# Patient Record
Sex: Female | Born: 1955 | Race: White | Hispanic: No | Marital: Married | Smoking: Former smoker
Health system: Southern US, Community
[De-identification: ages and names within clinical notes are randomized; demographics above are authoritative.]

## PROBLEM LIST (undated history)

## (undated) DIAGNOSIS — N2889 Other specified disorders of kidney and ureter: Secondary | ICD-10-CM

## (undated) DIAGNOSIS — I8000 Phlebitis and thrombophlebitis of superficial vessels of unspecified lower extremity: Secondary | ICD-10-CM

## (undated) DIAGNOSIS — G2581 Restless legs syndrome: Secondary | ICD-10-CM

## (undated) DIAGNOSIS — C541 Malignant neoplasm of endometrium: Secondary | ICD-10-CM

## (undated) DIAGNOSIS — C642 Malignant neoplasm of left kidney, except renal pelvis: Secondary | ICD-10-CM

## (undated) DIAGNOSIS — E559 Vitamin D deficiency, unspecified: Secondary | ICD-10-CM

## (undated) DIAGNOSIS — K259 Gastric ulcer, unspecified as acute or chronic, without hemorrhage or perforation: Secondary | ICD-10-CM

## (undated) DIAGNOSIS — R251 Tremor, unspecified: Secondary | ICD-10-CM

## (undated) DIAGNOSIS — R002 Palpitations: Secondary | ICD-10-CM

## (undated) DIAGNOSIS — R011 Cardiac murmur, unspecified: Secondary | ICD-10-CM

## (undated) DIAGNOSIS — G43009 Migraine without aura, not intractable, without status migrainosus: Secondary | ICD-10-CM

## (undated) DIAGNOSIS — F411 Generalized anxiety disorder: Secondary | ICD-10-CM

## (undated) DIAGNOSIS — M5412 Radiculopathy, cervical region: Secondary | ICD-10-CM

## (undated) DIAGNOSIS — I493 Ventricular premature depolarization: Secondary | ICD-10-CM

## (undated) DIAGNOSIS — M19049 Primary osteoarthritis, unspecified hand: Secondary | ICD-10-CM

## (undated) DIAGNOSIS — M1990 Unspecified osteoarthritis, unspecified site: Secondary | ICD-10-CM

## (undated) DIAGNOSIS — R7989 Other specified abnormal findings of blood chemistry: Secondary | ICD-10-CM

## (undated) DIAGNOSIS — G9332 Myalgic encephalomyelitis/chronic fatigue syndrome: Secondary | ICD-10-CM

## (undated) DIAGNOSIS — IMO0001 Reserved for inherently not codable concepts without codable children: Secondary | ICD-10-CM

## (undated) DIAGNOSIS — J309 Allergic rhinitis, unspecified: Secondary | ICD-10-CM

## (undated) DIAGNOSIS — N289 Disorder of kidney and ureter, unspecified: Secondary | ICD-10-CM

## (undated) DIAGNOSIS — E785 Hyperlipidemia, unspecified: Secondary | ICD-10-CM

## (undated) DIAGNOSIS — R5382 Chronic fatigue, unspecified: Secondary | ICD-10-CM

## (undated) DIAGNOSIS — D8989 Other specified disorders involving the immune mechanism, not elsewhere classified: Secondary | ICD-10-CM

## (undated) DIAGNOSIS — F341 Dysthymic disorder: Secondary | ICD-10-CM

## (undated) DIAGNOSIS — I10 Essential (primary) hypertension: Secondary | ICD-10-CM

## (undated) DIAGNOSIS — R413 Other amnesia: Secondary | ICD-10-CM

## (undated) DIAGNOSIS — F324 Major depressive disorder, single episode, in partial remission: Secondary | ICD-10-CM

## (undated) HISTORY — PX: OTHER SURGICAL HISTORY: SHX169

## (undated) HISTORY — DX: Ventricular premature depolarization: I49.3

## (undated) HISTORY — DX: Restless legs syndrome: G25.81

## (undated) HISTORY — PX: MANDIBLE SURGERY: SHX707

## (undated) HISTORY — DX: Radiculopathy, cervical region: M54.12

## (undated) HISTORY — DX: Dysthymic disorder: F34.1

## (undated) HISTORY — DX: Primary osteoarthritis, unspecified hand: M19.049

## (undated) HISTORY — DX: Hyperlipidemia, unspecified: E78.5

## (undated) HISTORY — DX: Tremor, unspecified: R25.1

## (undated) HISTORY — PX: NEPHRECTOMY TRANSPLANTED ORGAN: SUR880

## (undated) HISTORY — DX: Palpitations: R00.2

## (undated) HISTORY — DX: Essential (primary) hypertension: I10

## (undated) HISTORY — DX: Myalgic encephalomyelitis/chronic fatigue syndrome: G93.32

## (undated) HISTORY — DX: Other specified disorders involving the immune mechanism, not elsewhere classified: D89.89

## (undated) HISTORY — PX: CARPAL TUNNEL RELEASE: SHX101

## (undated) HISTORY — PX: TOTAL THYROIDECTOMY: SHX2547

## (undated) HISTORY — DX: Phlebitis and thrombophlebitis of superficial vessels of unspecified lower extremity: I80.00

## (undated) HISTORY — DX: Migraine without aura, not intractable, without status migrainosus: G43.009

## (undated) HISTORY — DX: Major depressive disorder, single episode, in partial remission: F32.4

## (undated) HISTORY — DX: Chronic fatigue, unspecified: R53.82

## (undated) HISTORY — DX: Generalized anxiety disorder: F41.1

## (undated) HISTORY — DX: Unspecified osteoarthritis, unspecified site: M19.90

## (undated) HISTORY — DX: Allergic rhinitis, unspecified: J30.9

---

## 2008-03-01 ENCOUNTER — Emergency Department (HOSPITAL_COMMUNITY): Admission: EM | Admit: 2008-03-01 | Discharge: 2008-03-01 | Payer: Self-pay | Admitting: Emergency Medicine

## 2008-05-07 ENCOUNTER — Other Ambulatory Visit: Admission: RE | Admit: 2008-05-07 | Discharge: 2008-05-07 | Payer: Self-pay | Admitting: Internal Medicine

## 2008-05-19 ENCOUNTER — Encounter: Admission: RE | Admit: 2008-05-19 | Discharge: 2008-05-19 | Payer: Self-pay | Admitting: Geriatric Medicine

## 2009-05-03 ENCOUNTER — Other Ambulatory Visit: Admission: RE | Admit: 2009-05-03 | Discharge: 2009-05-03 | Payer: Self-pay | Admitting: Internal Medicine

## 2010-01-02 ENCOUNTER — Encounter: Admission: RE | Admit: 2010-01-02 | Discharge: 2010-01-02 | Payer: Self-pay | Admitting: Internal Medicine

## 2010-03-01 LAB — BASIC METABOLIC PANEL
BUN: 18 mg/dL (ref 6–23)
CO2: 26 mEq/L (ref 19–32)
Calcium: 10.1 mg/dL (ref 8.4–10.5)
Chloride: 105 mEq/L (ref 96–112)
Creatinine, Ser: 0.91 mg/dL (ref 0.4–1.2)
GFR calc Af Amer: >60 mL/min (ref >60)
GFR calc non Af Amer: >60 mL/min (ref >60)
Glucose, Bld: 115 mg/dL — ABNORMAL HIGH (ref 70–99)
Potassium: 4.2 mEq/L (ref 3.5–5.1)
Sodium: 140 mEq/L (ref 135–145)

## 2010-03-03 ENCOUNTER — Ambulatory Visit
Admission: RE | Admit: 2010-03-03 | Discharge: 2010-03-03 | Payer: Self-pay | Source: Home / Self Care | Attending: Orthopedic Surgery | Admitting: Orthopedic Surgery

## 2010-03-06 LAB — POCT HEMOGLOBIN-HEMACUE: Hemoglobin: 14.4 g/dL (ref 12.0–15.0)

## 2010-03-10 NOTE — Op Note (Signed)
Krista Hodges, Krista Hodges                ACCOUNT NO.:  [id number]  MEDICAL RECORD NO.:  [id number]          PATIENT TYPE:  AMB  LOCATION:  DSC                          FACILITY:  [facility]  PHYSICIAN:  Krista Fitch. Buford Gayler, M.D. DATE OF BIRTH:  03/05/1955  DATE OF PROCEDURE:  03/03/2010 DATE OF DISCHARGE:                              OPERATIVE REPORT   PREOPERATIVE DIAGNOSIS:  Entrapped neuropathy, median nerve, right carpal tunnel.  POSTOPERATIVE DIAGNOSIS:  Entrapped neuropathy, median nerve, right carpal tunnel.  OPERATION:  Release of right transverse carpal ligament.  OPERATING SURGEON:  Krista Fitch. 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  She was referred through the courtesy of Dr. Johnella Moloney for evaluation and management of hand numbness.  Clinical examination suggested bilateral carpal tunnel syndrome.  Electrodiagnostic studies completed by Dr. Johna Roles on February 08, 2010, revealed significant right carpal tunnel syndrome and moderately severe left carpal tunnel syndrome.  Due to a failed respond to nonoperative measures, she was brought to the operating room at this time for release of her right transverse carpal ligament.  DESCRIPTION OF PROCEDURE:  Krista Hodges was interviewed in the holding area at the [facility] with her husband and daughter present. We reviewed her carpal tunnel syndrome predicament.  She understands the anticipated procedure.  Questions were invited and answered in detail. She was interviewed by Dr. Gypsy Balsam from anesthesia.  General anesthesia by LMA technique was recommended and accepted.  She was transferred to room 1 at [facility] and placed in supine position on the operating table.  Following the induction of general anesthesia by LMA technique, the  right arm was prepped with Betadine soap solution, sterilely draped.  A pneumatic tourniquet was applied to proximal right brachium.  Upon exsanguination of the right arm with Esmarch bandage, the arterial tourniquet was inflated to 250 mmHg. Procedure was commenced with short incision in the line of the ring finger of the palm.  Subcutaneous tissues were carefully divided, revealing the palmar fascia.  This was split longitudinally to reveal the common sensory branch of the median nerve.  These were followed back to the transverse carpal ligament which was gently isolated from the median nerve with a Penfield four Engineer, structural.  A subcutaneous pathway was created superficial to the transverse carpal ligament.  Scissors were used to release the ligament along its ulnar border, extending into the distal forearm.  Bleeding points were not problematic.  The wound was then repaired with intradermal 3-0 Prolene suture.  A compressive dressing was applied with a volar plaster splint, maintaining the wrist in 10 degrees of dorsiflexion.  For aftercare, she was provided a prescription for Percocet 5 mg 1 p.o. q.4-6 hours p.r.n. pain, 20 tablets without refill.  We will see Ms. Slawson back for followup in 1 week or sooner p.r.n. problems.     Krista Hodges, M.D.     RVS/MEDQ  D:  03/03/2010  T:  03/03/2010  Job:  [id number]  cc:   Candyce Churn, M.D.  Electronically Signed by Josephine Igo M.D. on 03/08/2010 08:03:45 AM

## 2010-03-10 NOTE — Op Note (Signed)
  [name], [name]                ACCOUNT NO.:  [id number]  MEDICAL RECORD NO.:  [id number]          PATIENT TYPE:  AMB  LOCATION:  DSC                          FACILITY:  [facility]  PHYSICIAN:  Katy Fitch. Sypher, M.D. DATE OF BIRTH:  07-29-1955  DATE OF PROCEDURE:  03/03/2010 DATE OF DISCHARGE:                              OPERATIVE REPORT   PREOPERATIVE DIAGNOSIS:  Severe right carpal tunnel syndrome with electrodiagnostic studies documented by Dr. Tyrell Antonio, physiatrist.  POSTOPERATIVE DIAGNOSIS:  Severe right carpal tunnel syndrome with electrodiagnostic studies documented by Dr. Tyrell Antonio, physiatrist.  OPERATIONS:  Release of right transverse carpal ligament.  OPERATING SURGEON:  Katy Fitch. Sypher, MD  ASSISTANT:  Marveen Reeks Dasnoit, PA-C  ANESTHESIA:  General by LMA.  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  She had previously been evaluated by Dr. Shirlee Latch.  She had had conservative care of carpal tunnel syndrome.  Contemporary electrodiagnostic studies revealed severe right carpal tunnel syndrome unresponsive to nonoperative care.  After informed consent, she is brought to the operative room at this time, anticipating release of right transverse carpal ligament.  PROCEDURE IN DETAIL:  Courteny Egler was brought to room 1 at the [facility] and placed in supine position on the operating table.  Following induction of general anesthesia under Dr. Burnett Corrente direct supervision, the right arm was prepped with Betadine soap and solution and sterilely draped.  After routine surgical time-out, procedure commenced with exsanguination of the right arm with an Esmarch bandage, inflation of the  arterial tourniquet to 250 mmHg.  Procedure commenced with a short incision in line of the ring finger and the palm.  Subcutaneous  tissues were carefully divided to reveal the palmar fascia.  This split longitudinally to reveal the common sensory branch of the median nerve and superficial palmar arch.  A pathway was created beneath the transverse carpal ligament with a Penfield 4 elevator followed by subcutaneous pathway overlying the transverse carpal ligament.  The transverse carpal ligament was released along its ulnar border, extending into the distal forearm, widely opened the carpal canal.  The ulnar bursa was noted to be fibrotic and swollen.  The median nerve was otherwise unimpeded.  Bleeding points were not problematic.  The wound was then repaired with intradermal 3-0 Prolene suture.  Compressive dressing was applied with a volar plaster splint, maintaining the wrist in 10 degrees of dorsiflexion.  For aftercare, she is provided a prescription for Percocet 5 mg 1 p.o. q.4-6 h. p.r.n. pain, 20 tablets without refill.     Katy Fitch Sypher, M.D.     RVS/MEDQ  D:  03/03/2010  T:  03/03/2010  Job:  [id number]  cc:   Dr. Tyrell Antonio  Electronically Signed by Josephine Igo M.D. on 03/08/2010 08:03:28 AM

## 2010-05-29 LAB — DIFFERENTIAL
Basophils Absolute: 0.1 10*3/uL (ref 0.0–0.1)
Basophils Relative: 2 % — ABNORMAL HIGH (ref 0–1)
Eosinophils Absolute: 0.1 10*3/uL (ref 0.0–0.7)
Eosinophils Relative: 1 % (ref 0–5)
Lymphocytes Relative: 29 % (ref 12–46)
Lymphs Abs: 1.8 10*3/uL (ref 0.7–4.0)
Monocytes Absolute: 0.4 10*3/uL (ref 0.1–1.0)
Monocytes Relative: 6 % (ref 3–12)
Neutro Abs: 3.9 10*3/uL (ref 1.7–7.7)
Neutrophils Relative %: 62 % (ref 43–77)

## 2010-05-29 LAB — BASIC METABOLIC PANEL
BUN: 12 mg/dL (ref 6–23)
CO2: 27 mEq/L (ref 19–32)
Calcium: 9.6 mg/dL (ref 8.4–10.5)
Chloride: 106 mEq/L (ref 96–112)
Creatinine, Ser: 0.8 mg/dL (ref 0.4–1.2)
GFR calc Af Amer: >60 mL/min (ref >60)
GFR calc non Af Amer: >60 mL/min (ref >60)
Glucose, Bld: 94 mg/dL (ref 70–99)
Potassium: 4.3 mEq/L (ref 3.5–5.1)
Sodium: 139 mEq/L (ref 135–145)

## 2010-05-29 LAB — POCT CARDIAC MARKERS
CKMB, poc: <1 ng/mL — ABNORMAL LOW (ref 1.0–8.0)
CKMB, poc: <1 ng/mL — ABNORMAL LOW (ref 1.0–8.0)
Myoglobin, poc: 60.8 ng/mL (ref 12–200)
Myoglobin, poc: 68.9 ng/mL (ref 12–200)
Troponin i, poc: <0.05 ng/mL (ref 0.00–0.09)
Troponin i, poc: <0.05 ng/mL (ref 0.00–0.09)

## 2010-05-29 LAB — CBC
HCT: 41 % (ref 36.0–46.0)
Hemoglobin: 14.1 g/dL (ref 12.0–15.0)
MCHC: 34.3 g/dL (ref 30.0–36.0)
MCV: 91.5 fL (ref 78.0–100.0)
Platelets: 245 10*3/uL (ref 150–400)
RBC: 4.49 MIL/uL (ref 3.87–5.11)
RDW: 12.3 % (ref 11.5–15.5)
WBC: 6.3 10*3/uL (ref 4.0–10.5)

## 2010-06-27 NOTE — Consult Note (Signed)
NAMEIDALY, VERRET                ACCOUNT NO.:  [id number]   MEDICAL RECORD NO.:  [id number]          PATIENT TYPE:  EMS   LOCATION:  MAJO                         FACILITY:  [facility]   PHYSICIAN:  Francisca December, M.D.  DATE OF BIRTH:  1956/01/10   DATE OF CONSULTATION:  03/01/2008  DATE OF DISCHARGE:  03/01/2008                                 CONSULTATION   CHIEF COMPLAINT:  Chest pain.   HISTORY OF PRESENT ILLNESS:  Ms. Krista Hodges is a 55 year old female with no  known history of coronary artery disease complaints of intermittent  chest pain for 1 week.  She worked last Monday with 30-minute episode of  chest pain with spontaneous resolution.  She has had several episodes  since that time lasting only a couple of minutes each time.  The  symptoms sometimes are associated with palpitations.  She denies any  nausea, vomiting, or diaphoresis.  She states that although there is no  pattern, she has complained of intermittent headache over the past 1-2  weeks.  Yesterday, she felt funny and thought she was having may be a  panic attack.  She took some Xanax and felt better but then had the  sense impending doom.  He took her blood pressure and was 150/90.  She  remained relaxed.  She came to work today and in the office, had another  episode of chest pain only lasting a couple of minutes.  They took her  blood pressure, it was 198/106.  They gave her a sublingual  nitroglycerin.  It brought her pressure down and now in the emergency  room, her systolic blood pressure is 98.  I asked her if she had chest  pain when she took the sublingual nitroglycerin, she states she did not,  although when she took the nitroglycerin she felt that her chest may be  felt better.  It is really unclear that if she had chest pain or not.   PAST MEDICAL HISTORY:  1. Hypertension.  2. Hyperlipidemia.  3. Anxiety.   SOCIAL HISTORY:  She lives in [place] with her husband.  She is  a Engineer, civil (consulting).  She smokes  1-1/2 packs per day.  No alcohol or illicit drug  use.   FAMILY HISTORY:  Mom died of colon cancer at age 33.  Dad had an  abdominal aortic aneurysm.  He also had angina.  He had a heart attack  at age 65.   ALLERGIES:  PENICILLIN.   MEDICATIONS:  1. Avalide 150/12.5 mg daily.  2. Vytorin 10/40 daily.  3. Baby aspirin daily.  4. Diclofenac p.r.n.  5. Zoloft 100 mg daily.  6. Xanax p.r.n.  7. Zyrtec p.r.n.   REVIEW OF SYSTEMS:  Chest pain and palpitations.  Review of systems is  otherwise negative.   PHYSICAL EXAMINATION:  VITAL SIGNS:  Temp 97.8, pulse 58, respirations  20, blood pressure 98/62, O2 saturation 99% on room air.  GENERAL:  She is in no acute distress.  HEENT:  Grossly normal.  No carotid or subclavian bruits.  No JVD or  thyromegaly.  Sclerae  clear.  Conjunctivae normal.  Nares without  drainage.  CHEST:  Clear to auscultation bilaterally.  No wheezing or rhonchi.  HEART:  Regular rate and rhythm.  No evidence of murmur, rub, or ectopy.  ABDOMEN:  Good bowel sounds, nontender, and nondistended.  No masses, no  bruits.  LOWER EXTREMITIES:  No peripheral edema.  SKIN:  Warm and dry.  NEUROLOGIC:  Cranial nerves II through XII grossly intact.  PSYCH:  She has normal mood and affect.   CHEST X-RAY:  No active disease.   LABORATORY STUDIES:  Hemoglobin 14.1, hematocrit 41, platelets 245,  white count 6.3.  Sodium 139, potassium 4.3, BUN 12, creatinine 0.80.  Point-of-care markers negative x2.  EKG, normal sinus rhythm in the 80s,  nonacute.   ASSESSMENT AND PLAN:  1. Angina, atypical.  2. Hypertension, which could be associated with chest pain, plus or      minus headaches, this seems to be under better control.  Perhaps,      we could increase her Avalide, although at this point, her pressure      is around 98, because of the nitroglycerin.  3. Hyperlipidemia.  4. Anxiety.   Assuming her point-of-care marker is negative, we are going to discharge  her  from the hospital and we will give her prescription for sublingual  nitroglycerin p.r.n. chest pain.  She needs to have a stress Cardiolite  and I have set this up for March 09, 2008, at 1 p.m.      Guy Franco, P.A.      Francisca December, M.D.  Electronically Signed    LB/MEDQ  D:  03/01/2008  T:  03/02/2008  Job:  [id number]   cc:   Candyce Churn, M.D.

## 2011-04-09 ENCOUNTER — Other Ambulatory Visit (HOSPITAL_COMMUNITY)
Admission: RE | Admit: 2011-04-09 | Discharge: 2011-04-09 | Disposition: A | Discharge status: Home / Self Care | Payer: PRIVATE HEALTH INSURANCE | Source: Ambulatory Visit | Attending: Internal Medicine | Admitting: Internal Medicine

## 2011-04-09 ENCOUNTER — Other Ambulatory Visit: Payer: Self-pay | Admitting: *Deleted

## 2011-04-09 DIAGNOSIS — Z01419 Encounter for gynecological examination (general) (routine) without abnormal findings: Secondary | ICD-10-CM | POA: Insufficient documentation

## 2011-12-07 ENCOUNTER — Other Ambulatory Visit: Payer: Self-pay | Admitting: Internal Medicine

## 2011-12-07 DIAGNOSIS — M5412 Radiculopathy, cervical region: Secondary | ICD-10-CM

## 2011-12-11 ENCOUNTER — Other Ambulatory Visit: Payer: PRIVATE HEALTH INSURANCE

## 2011-12-19 ENCOUNTER — Other Ambulatory Visit: Payer: Self-pay | Admitting: Internal Medicine

## 2011-12-19 DIAGNOSIS — Z1231 Encounter for screening mammogram for malignant neoplasm of breast: Secondary | ICD-10-CM

## 2011-12-20 ENCOUNTER — Ambulatory Visit
Admission: RE | Admit: 2011-12-20 | Discharge: 2011-12-20 | Disposition: A | Discharge status: Home / Self Care | Payer: BC Managed Care – PPO | Source: Ambulatory Visit | Attending: Internal Medicine | Admitting: Internal Medicine

## 2011-12-20 DIAGNOSIS — Z1231 Encounter for screening mammogram for malignant neoplasm of breast: Secondary | ICD-10-CM

## 2011-12-27 ENCOUNTER — Ambulatory Visit
Admission: RE | Admit: 2011-12-27 | Discharge: 2011-12-27 | Disposition: A | Discharge status: Home / Self Care | Payer: BC Managed Care – PPO | Source: Ambulatory Visit | Attending: Internal Medicine | Admitting: Internal Medicine

## 2011-12-27 DIAGNOSIS — M5412 Radiculopathy, cervical region: Secondary | ICD-10-CM

## 2013-09-16 DIAGNOSIS — I493 Ventricular premature depolarization: Secondary | ICD-10-CM | POA: Insufficient documentation

## 2013-09-16 DIAGNOSIS — R002 Palpitations: Secondary | ICD-10-CM | POA: Insufficient documentation

## 2013-10-12 ENCOUNTER — Other Ambulatory Visit: Payer: Self-pay | Admitting: Internal Medicine

## 2013-10-12 DIAGNOSIS — Z1231 Encounter for screening mammogram for malignant neoplasm of breast: Secondary | ICD-10-CM

## 2013-10-16 ENCOUNTER — Encounter: Payer: Self-pay | Admitting: *Deleted

## 2013-10-16 ENCOUNTER — Encounter: Payer: Self-pay | Admitting: Cardiovascular Disease

## 2013-10-16 DIAGNOSIS — E785 Hyperlipidemia, unspecified: Secondary | ICD-10-CM | POA: Insufficient documentation

## 2013-10-16 DIAGNOSIS — G43009 Migraine without aura, not intractable, without status migrainosus: Secondary | ICD-10-CM | POA: Insufficient documentation

## 2013-10-16 DIAGNOSIS — G9332 Myalgic encephalomyelitis/chronic fatigue syndrome: Secondary | ICD-10-CM | POA: Insufficient documentation

## 2013-10-16 DIAGNOSIS — G2581 Restless legs syndrome: Secondary | ICD-10-CM | POA: Insufficient documentation

## 2013-10-16 DIAGNOSIS — M1990 Unspecified osteoarthritis, unspecified site: Secondary | ICD-10-CM | POA: Insufficient documentation

## 2013-10-16 DIAGNOSIS — M76899 Other specified enthesopathies of unspecified lower limb, excluding foot: Secondary | ICD-10-CM | POA: Insufficient documentation

## 2013-10-16 DIAGNOSIS — L74519 Primary focal hyperhidrosis, unspecified: Secondary | ICD-10-CM | POA: Insufficient documentation

## 2013-10-16 DIAGNOSIS — F4320 Adjustment disorder, unspecified: Secondary | ICD-10-CM | POA: Insufficient documentation

## 2013-10-16 DIAGNOSIS — F324 Major depressive disorder, single episode, in partial remission: Secondary | ICD-10-CM | POA: Insufficient documentation

## 2013-10-16 DIAGNOSIS — E559 Vitamin D deficiency, unspecified: Secondary | ICD-10-CM | POA: Insufficient documentation

## 2013-10-16 DIAGNOSIS — R413 Other amnesia: Secondary | ICD-10-CM | POA: Insufficient documentation

## 2013-10-16 DIAGNOSIS — F341 Dysthymic disorder: Secondary | ICD-10-CM | POA: Insufficient documentation

## 2013-10-16 DIAGNOSIS — E669 Obesity, unspecified: Secondary | ICD-10-CM | POA: Insufficient documentation

## 2013-10-16 DIAGNOSIS — I10 Essential (primary) hypertension: Secondary | ICD-10-CM | POA: Insufficient documentation

## 2013-10-16 DIAGNOSIS — I8000 Phlebitis and thrombophlebitis of superficial vessels of unspecified lower extremity: Secondary | ICD-10-CM | POA: Insufficient documentation

## 2013-10-16 DIAGNOSIS — M19049 Primary osteoarthritis, unspecified hand: Secondary | ICD-10-CM | POA: Insufficient documentation

## 2013-10-16 DIAGNOSIS — F411 Generalized anxiety disorder: Secondary | ICD-10-CM | POA: Insufficient documentation

## 2013-10-16 DIAGNOSIS — D8989 Other specified disorders involving the immune mechanism, not elsewhere classified: Secondary | ICD-10-CM | POA: Insufficient documentation

## 2013-10-16 DIAGNOSIS — R5382 Chronic fatigue, unspecified: Secondary | ICD-10-CM

## 2013-10-16 DIAGNOSIS — M5412 Radiculopathy, cervical region: Secondary | ICD-10-CM | POA: Insufficient documentation

## 2013-10-16 DIAGNOSIS — J309 Allergic rhinitis, unspecified: Secondary | ICD-10-CM | POA: Insufficient documentation

## 2013-10-20 ENCOUNTER — Ambulatory Visit (INDEPENDENT_AMBULATORY_CARE_PROVIDER_SITE_OTHER): Payer: BC Managed Care – PPO | Admitting: Cardiovascular Disease

## 2013-10-20 ENCOUNTER — Encounter: Payer: Self-pay | Admitting: Cardiovascular Disease

## 2013-10-20 VITALS — BP 140/74 | HR 63 | Ht 65.0 in | Wt 205.8 lb

## 2013-10-20 DIAGNOSIS — I493 Ventricular premature depolarization: Secondary | ICD-10-CM | POA: Insufficient documentation

## 2013-10-20 DIAGNOSIS — I10 Essential (primary) hypertension: Secondary | ICD-10-CM

## 2013-10-20 DIAGNOSIS — R002 Palpitations: Secondary | ICD-10-CM

## 2013-10-20 DIAGNOSIS — R0600 Dyspnea, unspecified: Secondary | ICD-10-CM

## 2013-10-20 DIAGNOSIS — I4949 Other premature depolarization: Secondary | ICD-10-CM

## 2013-10-20 DIAGNOSIS — R0609 Other forms of dyspnea: Secondary | ICD-10-CM

## 2013-10-20 DIAGNOSIS — R0989 Other specified symptoms and signs involving the circulatory and respiratory systems: Secondary | ICD-10-CM

## 2013-10-20 DIAGNOSIS — F411 Generalized anxiety disorder: Secondary | ICD-10-CM

## 2013-10-20 NOTE — Assessment & Plan Note (Signed)
Likely benign  PRN Inderal called in  Echo and ETT to r/o structural heart disease

## 2013-10-20 NOTE — Progress Notes (Signed)
Patient ID: Krista Hodges, female   DOB: 1955-04-11, 58 y.o.   MRN: [id number]    58 yo referred by primary for palpitations and PVC;s on ECG  Palpitations over 6 months worse last few weeks  Some associated dyspnea No pre-syncope or chest pain.  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  Minimal caffeine and no ETOH.  Triggers hard to identify  More sedentary events not exertional  More during day but also at night watching t.v. Fatigue , malaise and more headaches.  Xanax helps with palpitations  Former smoker quit in 2014   Recent labs 09/16/13  K 4.6 Hct 44.7  TSH 2.24 on 5/15      ROS: Denies fever, malais, weight loss, blurry vision, decreased visual acuity, cough, sputum, SOB, hemoptysis, pleuritic pain, palpitaitons, heartburn, abdominal pain, melena, lower extremity edema, claudication, or rash.  All other systems reviewed and negative   General: Affect appropriate Overweight white female  HEENT: normal Neck supple with no adenopathy JVP normal no bruits no thyromegaly Lungs clear with no wheezing and good diaphragmatic motion Heart:  S1/S2 no murmur,rub, gallop or click PMI normal Abdomen: benighn, BS positve, no tenderness, no AAA no bruit.  No HSM or HJR Distal pulses intact with no bruits No edema Neuro non-focal Skin warm and dry No muscular weakness  Medications No current outpatient prescriptions on file.   No current facility-administered medications for this visit.    Allergies Review of patient's allergies indicates not on file.  Family History: Family History  Problem Relation Age of Onset  . Cancer Mother     Social History: History   Social History  . Marital Status: Married    Spouse Name: N/A    Number of Children: N/A  . Years of Education: N/A   Occupational History  . Not on file.   Social History Main Topics  . Smoking  status: Not on file  . Smokeless tobacco: Not on file  . Alcohol Use: Not on file  . Drug Use: Not on file  . Sexual Activity: Not on file   Other Topics Concern  . Not on file   Social History Narrative  . No narrative on file    Electrocardiogram:  SR normal except for isolated PVC rate 66 09/16/13  Assessment and Plan

## 2013-10-20 NOTE — Patient Instructions (Signed)
Your physician recommends that you schedule a follow-up appointment in:   [place] Your physician recommends that you continue on your current medications as directed. Please refer to the Current Medication list given to you today. Your physician has requested that you have an exercise tolerance test. For further information please visit [email or web address]. Please also follow instruction sheet, as given.   Your physician has requested that you have an echocardiogram. Echocardiography is a painless test that uses sound waves to create images of your heart. It provides your doctor with information about the size and shape of your heart and how well your heart's chambers and valves are working. This procedure takes approximately one hour. There are no restrictions for this procedure.

## 2013-10-20 NOTE — Assessment & Plan Note (Signed)
Well controlled.  Continue current medications and low sodium Dash type diet.    

## 2013-10-20 NOTE — Assessment & Plan Note (Signed)
Seems to be biggest issue with lots of recent issues including sons paralysis.  Likely adrenergic tone contributing to PVCs  F/u primary to simplify meds Currently on both welbutrin and zoloft

## 2013-10-20 NOTE — Assessment & Plan Note (Signed)
Normal cardiopulmonary exam  Interesting that she indicates desats with frequent palpitations.  Echo to assess RV and LV function

## 2013-10-22 ENCOUNTER — Telehealth: Payer: Self-pay

## 2013-10-22 NOTE — Telephone Encounter (Signed)
Not toradol  PRN Inderal 10mg  30 tabs PRN q 6 hours as needed for palpitations

## 2013-10-23 ENCOUNTER — Other Ambulatory Visit: Payer: Self-pay

## 2013-10-23 ENCOUNTER — Ambulatory Visit (HOSPITAL_COMMUNITY): Payer: BC Managed Care – PPO | Attending: Cardiology

## 2013-10-23 ENCOUNTER — Ambulatory Visit
Admission: RE | Admit: 2013-10-23 | Discharge: 2013-10-23 | Disposition: A | Discharge status: Home / Self Care | Payer: BC Managed Care – PPO | Source: Ambulatory Visit | Attending: Internal Medicine | Admitting: Internal Medicine

## 2013-10-23 DIAGNOSIS — I10 Essential (primary) hypertension: Secondary | ICD-10-CM | POA: Insufficient documentation

## 2013-10-23 DIAGNOSIS — Z1231 Encounter for screening mammogram for malignant neoplasm of breast: Secondary | ICD-10-CM

## 2013-10-23 DIAGNOSIS — Z87891 Personal history of nicotine dependence: Secondary | ICD-10-CM | POA: Insufficient documentation

## 2013-10-23 DIAGNOSIS — E785 Hyperlipidemia, unspecified: Secondary | ICD-10-CM | POA: Diagnosis not present

## 2013-10-23 DIAGNOSIS — R002 Palpitations: Secondary | ICD-10-CM | POA: Insufficient documentation

## 2013-10-23 DIAGNOSIS — I059 Rheumatic mitral valve disease, unspecified: Secondary | ICD-10-CM | POA: Insufficient documentation

## 2013-10-23 DIAGNOSIS — F3289 Other specified depressive episodes: Secondary | ICD-10-CM | POA: Diagnosis not present

## 2013-10-23 DIAGNOSIS — M129 Arthropathy, unspecified: Secondary | ICD-10-CM | POA: Diagnosis not present

## 2013-10-23 DIAGNOSIS — R413 Other amnesia: Secondary | ICD-10-CM | POA: Insufficient documentation

## 2013-10-23 DIAGNOSIS — I4949 Other premature depolarization: Secondary | ICD-10-CM | POA: Diagnosis not present

## 2013-10-23 DIAGNOSIS — F329 Major depressive disorder, single episode, unspecified: Secondary | ICD-10-CM | POA: Insufficient documentation

## 2013-10-23 MED ORDER — PROPRANOLOL HCL 10 MG PO TABS: 10.0000 mg | ORAL_TABLET | Freq: Four times a day (QID) | ORAL | Status: DC | PRN

## 2013-10-23 NOTE — Progress Notes (Signed)
2D Echo completed. 10/23/2013 

## 2013-10-28 ENCOUNTER — Ambulatory Visit: Payer: BC Managed Care – PPO

## 2013-11-02 ENCOUNTER — Telehealth: Payer: Self-pay | Admitting: Cardiovascular Disease

## 2013-11-02 NOTE — Telephone Encounter (Signed)
New problem ° ° °Pt returning your call. °

## 2013-11-02 NOTE — Telephone Encounter (Signed)
PT AWARE OF ECHO RSEULTS./CY

## 2013-11-18 ENCOUNTER — Telehealth: Payer: Self-pay | Admitting: *Deleted

## 2013-11-18 ENCOUNTER — Ambulatory Visit (INDEPENDENT_AMBULATORY_CARE_PROVIDER_SITE_OTHER): Payer: BC Managed Care – PPO | Admitting: Nurse Practitioner

## 2013-11-18 ENCOUNTER — Encounter: Payer: Self-pay | Admitting: Nurse Practitioner

## 2013-11-18 VITALS — BP 120/78 | HR 76

## 2013-11-18 DIAGNOSIS — R0600 Dyspnea, unspecified: Secondary | ICD-10-CM

## 2013-11-18 DIAGNOSIS — R002 Palpitations: Secondary | ICD-10-CM

## 2013-11-18 NOTE — Telephone Encounter (Signed)
Message copied by Richmond Campbell on Wed Nov 18, 2013  2:29 PM ------      Message from: Josue Hector      Created: Wed Nov 18, 2013  1:00 PM       Normal ETT            ----- Message -----         From: Burtis Junes, NP         Sent: 11/18/2013  12:18 PM           To: Josue Hector, MD                   ------

## 2013-11-18 NOTE — Progress Notes (Signed)
Exercise Treadmill Test  Pre-Exercise Testing Evaluation Rhythm: normal sinus  Rate: 71 bpm     Test  Exercise Tolerance Test Ordering MD: Jenkins Rouge, MD  Interpreting MD: Truitt Merle, NP  Unique Test No: 1  Treadmill:  1  Indication for ETT: Dyspnea/Palps  Contraindication to ETT: No   Stress Modality: exercise - treadmill  Cardiac Imaging Performed: non   Protocol: standard Bruce - maximal  Max BP:  202/77  Max MPHR (bpm):  161 85% MPR (bpm):  137  MPHR obtained (bpm):  142 % MPHR obtained:  88%  Reached 85% MPHR (min:sec):  7:17 Total Exercise Time (min-sec):  8 minutes  Workload in METS:  10.0 Borg Scale: 17  Reason ETT Terminated:  patient's desire to stop    ST Segment Analysis At Rest: normal ST segments - no evidence of significant ST depression With Exercise: no evidence of significant ST depression  Other Information Arrhythmia:  No Angina during ETT:  absent (0) Quality of ETT:  diagnostic  ETT Interpretation:  normal - no evidence of ischemia by ST analysis  Comments: Patient presents today for routine GXT. Has had palpitations. No active chest pain. PVCs have been noted.  Echo with normal EF - no MVP noted - and diastolic dysfunction. Has been using PRN Inderal with good results.   Today the patient exercised on the standard Bruce protocol for a total of 8 minutes.  Good exercise tolerance.  Adequate blood pressure response.  Clinically negative for chest pain. Test was stopped due to target HR achieved and patient fatigue.  EKG negative for ischemia. No significant arrhythmia noted. One PVC noted.   Recommendations: CV risk factor modification.  See back as planned  Patient is agreeable to this plan and will call if any problems develop in the interim.   Burtis Junes, RN, [place] [address] [place], [place]  [address] [phone]

## 2013-11-18 NOTE — Telephone Encounter (Signed)
LMTCB ./CY 

## 2013-11-19 NOTE — Telephone Encounter (Signed)
Spoke with pt and informed her of normal ETT.  She reports she was told her heart was stiff and she is concerned about this.  I reviewed echo results with her.  She has upcoming appt with Dr. Johnsie Cancel on November 30, 2013 and will discuss results with him at this appt

## 2013-11-19 NOTE — Telephone Encounter (Signed)
Follow Up--pt returned call  °

## 2013-11-30 ENCOUNTER — Ambulatory Visit (INDEPENDENT_AMBULATORY_CARE_PROVIDER_SITE_OTHER): Payer: BC Managed Care – PPO | Admitting: Cardiovascular Disease

## 2013-11-30 ENCOUNTER — Encounter: Payer: Self-pay | Admitting: Cardiovascular Disease

## 2013-11-30 VITALS — BP 124/68 | HR 67 | Ht 65.0 in | Wt 202.8 lb

## 2013-11-30 DIAGNOSIS — I493 Ventricular premature depolarization: Secondary | ICD-10-CM

## 2013-11-30 DIAGNOSIS — I10 Essential (primary) hypertension: Secondary | ICD-10-CM

## 2013-11-30 NOTE — Assessment & Plan Note (Signed)
Well controlled.  Continue current medications and low sodium Dash type diet.    

## 2013-11-30 NOTE — Progress Notes (Signed)
Patient ID: Krista Hodges, female   DOB: 1955-06-06, 58 y.o.   MRN: [id number] 58 yo referred by primary for palpitations and PVC;s on ECG First seen 9/15   Palpitations over 6 months worse last few weeks Some associated dyspnea No pre-syncope or chest pain. 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 Minimal caffeine and no ETOH. Triggers hard to identify More sedentary events not exertional More during day but also at night watching t.v. Fatigue , malaise and more headaches. Xanax helps with palpitations Former smoker quit in 2014   Recent labs 09/16/13 K 4.6 Hct 44.7 TSH 2.24 on 5/15   Echo 9/15  EF 60-65% mild LVH no signficant valve disease   ETT normal: Today the patient exercised on the standard Bruce protocol for a total of 8 minutes.  Good exercise tolerance.  Adequate blood pressure response.  Clinically negative for chest pain. Test was stopped due to target HR achieved and patient fatigue.  EKG negative for ischemia. No significant arrhythmia noted. One PVC noted.   She is taking inderal a few times/month with improvement    ROS: Denies fever, malais, weight loss, blurry vision, decreased visual acuity, cough, sputum, SOB, hemoptysis, pleuritic pain, palpitaitons, heartburn, abdominal pain, melena, lower extremity edema, claudication, or rash.  All other systems reviewed and negative  General: Affect appropriate Healthy:  appears stated age 38: normal Neck supple with no adenopathy JVP normal no bruits no thyromegaly Lungs clear with no wheezing and good diaphragmatic motion Heart:  S1/S2 no murmur, no rub, gallop or click PMI normal Abdomen: benighn, BS positve, no tenderness, no AAA no bruit.  No HSM or HJR Distal pulses intact with no bruits No edema Neuro non-focal Skin warm and dry No muscular weakness   Current Outpatient Prescriptions  Medication  Sig Dispense Refill  . ALPRAZolam (XANAX) 0.25 MG tablet Take 0.25 mg by mouth 2 (two) times daily as needed for anxiety.      Marland Kitchen aspirin 81 MG tablet Take 81 mg by mouth daily.      Marland Kitchen buPROPion (WELLBUTRIN XL) 150 MG 24 hr tablet Take 150 mg by mouth 2 (two) times daily.      . busPIRone (BUSPAR) 15 MG tablet Take 15 mg by mouth 2 (two) times daily.      . Cholecalciferol (VITAMIN D3) 10000 UNITS capsule Take 10,000 Units by mouth daily.      . cyclobenzaprine (FLEXERIL) 10 MG tablet Take 5 mg by mouth 3 (three) times daily as needed for muscle spasms.      . diclofenac (VOLTAREN) 75 MG EC tablet Take 75 mg by mouth 2 (two) times daily.      Marland Kitchen ezetimibe-simvastatin (VYTORIN) 10-40 MG per tablet Take 1 tablet by mouth daily.      . hydrochlorothiazide (HYDRODIURIL) 25 MG tablet Take 25 mg by mouth daily.      . irbesartan (AVAPRO) 300 MG tablet Take 300 mg by mouth daily.      Marland Kitchen isometheptene-acetaminophen-dichloralphenazone (MIDRIN) 65-325-100 MG capsule Take 1 capsule by mouth every 4 (four) hours as needed for migraine. Maximum 5 capsules in 12 hours for migraine headaches, 8 capsules in 24 hours for tension headaches.      . meloxicam (MOBIC) 15 MG tablet Take 15 mg by mouth daily.      . mometasone (NASONEX) 50 MCG/ACT nasal spray Place 2 sprays  into the nose daily.      . Multiple Vitamins-Minerals (HAIR/SKIN/NAILS PO) Take by mouth daily.      . pramipexole (MIRAPEX) 0.5 MG tablet Take 0.5 mg by mouth at bedtime.      . propranolol (INDERAL) 10 MG tablet Take 1 tablet (10 mg total) by mouth every 6 (six) hours as needed (for palpitations).  30 tablet  6  . sertraline (ZOLOFT) 100 MG tablet Take 150 mg by mouth daily.      . traMADol (ULTRAM) 50 MG tablet Take 50 mg by mouth every 6 (six) hours as needed.       No current facility-administered medications for this visit.    Allergies  Penicillins and Requip  Electrocardiogram:   SR normal except for isolated PVC rate 66  09/16/13   Assessment and Plan

## 2013-11-30 NOTE — Assessment & Plan Note (Signed)
Benign normal echo and ETT PRN inderal  F/u a year

## 2013-11-30 NOTE — Patient Instructions (Signed)
Your physician wants you to follow-up in: YEAR WITH DR NISHAN  You will receive a reminder letter in the mail two months in advance. If you don't receive a letter, please call our office to schedule the follow-up appointment.  Your physician recommends that you continue on your current medications as directed. Please refer to the Current Medication list given to you today. 

## 2014-01-29 ENCOUNTER — Emergency Department (HOSPITAL_COMMUNITY)
Admission: EM | Admit: 2014-01-29 | Discharge: 2014-01-29 | Disposition: A | Discharge status: Home / Self Care | Payer: BC Managed Care – PPO | Attending: Emergency Medicine | Admitting: Emergency Medicine

## 2014-01-29 ENCOUNTER — Encounter (HOSPITAL_COMMUNITY): Payer: Self-pay | Admitting: Emergency Medicine

## 2014-01-29 DIAGNOSIS — G43809 Other migraine, not intractable, without status migrainosus: Secondary | ICD-10-CM | POA: Diagnosis not present

## 2014-01-29 DIAGNOSIS — Z880 Allergy status to penicillin: Secondary | ICD-10-CM | POA: Insufficient documentation

## 2014-01-29 DIAGNOSIS — F329 Major depressive disorder, single episode, unspecified: Secondary | ICD-10-CM | POA: Insufficient documentation

## 2014-01-29 DIAGNOSIS — M545 Low back pain, unspecified: Secondary | ICD-10-CM

## 2014-01-29 DIAGNOSIS — Z791 Long term (current) use of non-steroidal anti-inflammatories (NSAID): Secondary | ICD-10-CM | POA: Diagnosis not present

## 2014-01-29 DIAGNOSIS — Z8709 Personal history of other diseases of the respiratory system: Secondary | ICD-10-CM | POA: Diagnosis not present

## 2014-01-29 DIAGNOSIS — M1990 Unspecified osteoarthritis, unspecified site: Secondary | ICD-10-CM | POA: Diagnosis not present

## 2014-01-29 DIAGNOSIS — G8929 Other chronic pain: Secondary | ICD-10-CM | POA: Insufficient documentation

## 2014-01-29 DIAGNOSIS — Z872 Personal history of diseases of the skin and subcutaneous tissue: Secondary | ICD-10-CM | POA: Diagnosis not present

## 2014-01-29 DIAGNOSIS — I10 Essential (primary) hypertension: Secondary | ICD-10-CM | POA: Diagnosis not present

## 2014-01-29 DIAGNOSIS — E669 Obesity, unspecified: Secondary | ICD-10-CM | POA: Insufficient documentation

## 2014-01-29 DIAGNOSIS — Z87891 Personal history of nicotine dependence: Secondary | ICD-10-CM | POA: Insufficient documentation

## 2014-01-29 DIAGNOSIS — Z79899 Other long term (current) drug therapy: Secondary | ICD-10-CM | POA: Insufficient documentation

## 2014-01-29 DIAGNOSIS — F419 Anxiety disorder, unspecified: Secondary | ICD-10-CM | POA: Diagnosis not present

## 2014-01-29 DIAGNOSIS — Z7982 Long term (current) use of aspirin: Secondary | ICD-10-CM | POA: Diagnosis not present

## 2014-01-29 DIAGNOSIS — G2581 Restless legs syndrome: Secondary | ICD-10-CM | POA: Diagnosis not present

## 2014-01-29 MED ORDER — IBUPROFEN 800 MG PO TABS
800.0000 mg | ORAL_TABLET | Freq: Once | ORAL | Status: AC
  Administered 2014-01-29: 800 mg via ORAL
  Filled 2014-01-29: qty 1

## 2014-01-29 MED ORDER — HYDROCODONE-ACETAMINOPHEN 5-325 MG PO TABS
2.0000 | ORAL_TABLET | Freq: Once | ORAL | Status: AC
  Administered 2014-01-29: 2 via ORAL
  Filled 2014-01-29: qty 2

## 2014-01-29 MED ORDER — HYDROCODONE-ACETAMINOPHEN 5-325 MG PO TABS: 2.0000 | ORAL_TABLET | Freq: Three times a day (TID) | ORAL | Status: DC | PRN

## 2014-01-29 NOTE — ED Notes (Signed)
Pt also states she was seen at urgent care on Monday evening and was given a steroid shot and given prescriptions but it has not helped

## 2014-01-29 NOTE — ED Provider Notes (Signed)
CSN: [id number]     Arrival date & time 01/29/14  0105 History   First MD Initiated Contact with Patient 01/29/14 [phone]     Chief Complaint  Patient presents with  . Neck Pain  . Back Pain     (Consider location/radiation/quality/duration/timing/severity/associated sxs/prior Treatment) HPI Krista Hodges is a 59 y.o. female with past medical history of cervical radiculitis, arthritis, chronic back pain coming in with worsening back pain. Patient states she was lifting heavy boxes 5 days ago when she experienced sudden onset of right-sided back pain. She went to an urgent care 4 days ago and received a steroid shot and meloxicam. This has not helped her symptoms. Her pain is throughout her cervical thoracic and lumbar paraspinal areas. She denies any neurological deficits, urinary retention, incontinence, numbness or tingling in the bilateral lower extremities. She's had no fevers or recent infections. Patient has no further complaints.  10 Systems reviewed and are negative for acute change except as noted in the HPI.     Past Medical History  Diagnosis Date  . Atypical migraine   . HLD (hyperlipidemia)   . Premature complex, ventricular   . 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 Essential (primary) hypertension   . Allergic rhinitis   . Anxiety state   . Enthesopathy of hip   . Cervical radiculitis   . Depression, neurotic   . CFIDS (chronic fatigue and immune dysfunction syndrome)   . Inflammation of hand joint   . Excessive sweating, local   . Depression, major, single episode, in partial remission   . Amnesia   . Adiposity   . Arthritis, degenerative   . Awareness of heartbeats   . Restless leg   . Adaptation reaction   . Avitaminosis D    Past Surgical History  Procedure Laterality Date  . C sections x 3     . Mandible surgery     Family History  Problem Relation Age of Onset  . Cancer Mother   . Heart attack Father   . Diabetes Other     History  Substance Use Topics  . Smoking status: Former Research scientist (life sciences)  . Smokeless tobacco: Not on file  . Alcohol Use: Yes     Comment: rare   OB History    No data available     Review of Systems    Allergies  Penicillins and Requip  Home Medications   Prior to Admission medications   Medication Sig Start Date End Date Taking? Authorizing Provider  ALPRAZolam (XANAX) 0.25 MG tablet Take 0.25 mg by mouth 2 (two) times daily as needed for anxiety.   Yes Historical Provider, MD  aspirin 81 MG tablet Take 81 mg by mouth daily.   Yes Historical Provider, MD  buPROPion (WELLBUTRIN XL) 150 MG 24 hr tablet Take 150 mg by mouth 2 (two) times daily.   Yes Historical Provider, MD  busPIRone (BUSPAR) 15 MG tablet Take 15 mg by mouth 2 (two) times daily.   Yes Historical Provider, MD  Cholecalciferol (VITAMIN D3) 10000 UNITS capsule Take 30,000 Units by mouth 3 (three) times a week. Mon, Wed, Fri   Yes Historical Provider, MD  ezetimibe-simvastatin (VYTORIN) 10-40 MG per tablet Take 1 tablet by mouth daily.   Yes Historical Provider, MD  hydrochlorothiazide (HYDRODIURIL) 25 MG tablet Take 25 mg by mouth daily.   Yes Historical Provider, MD  ibuprofen (ADVIL,MOTRIN) 200 MG tablet Take 400 mg by mouth every 6 (six) hours as  needed for moderate pain.   Yes Historical Provider, MD  irbesartan (AVAPRO) 300 MG tablet Take 300 mg by mouth daily.   Yes Historical Provider, MD  isometheptene-acetaminophen-dichloralphenazone (MIDRIN) 65-325-100 MG capsule Take 1 capsule by mouth every 4 (four) hours as needed for migraine. Maximum 5 capsules in 12 hours for migraine headaches, 8 capsules in 24 hours for tension headaches.   Yes Historical Provider, MD  meloxicam (MOBIC) 15 MG tablet Take 15 mg by mouth daily.   Yes Historical Provider, MD  Multiple Vitamins-Minerals (HAIR/SKIN/NAILS PO) Take by mouth daily.   Yes Historical Provider, MD  propranolol (INDERAL) 10 MG tablet Take 1 tablet (10 mg total) by  mouth every 6 (six) hours as needed (for palpitations). 10/23/13  Yes Josue Hector, MD  sertraline (ZOLOFT) 100 MG tablet Take 150 mg by mouth 2 (two) times daily.    Yes Historical Provider, MD  tiZANidine (ZANAFLEX) 4 MG tablet Take 4-8 mg by mouth every 6 (six) hours as needed for muscle spasms.   Yes Historical Provider, MD  traMADol (ULTRAM) 50 MG tablet Take 50 mg by mouth every 6 (six) hours as needed (Neck Pain).    Yes Historical Provider, MD  pramipexole (MIRAPEX) 0.5 MG tablet Take 0.5 mg by mouth at bedtime.    Historical Provider, MD   BP 143/76 mmHg  Pulse 68  Temp(Src) 97.8 F (36.6 C) (Oral)  Resp 18  SpO2 95% Physical Exam  Constitutional: She is oriented to person, place, and time. She appears well-developed and well-nourished. No distress.  Obese female  HENT:  Head: Normocephalic and atraumatic.  Nose: Nose normal.  Mouth/Throat: Oropharynx is clear and moist. No oropharyngeal exudate.  Eyes: Conjunctivae and EOM are normal. Pupils are equal, round, and reactive to light. No scleral icterus.  Neck: Normal range of motion. Neck supple. No JVD present. No tracheal deviation present. No thyromegaly present.  Cardiovascular: Normal rate, regular rhythm and normal heart sounds.  Exam reveals no gallop and no friction rub.   No murmur heard. Pulmonary/Chest: Effort normal and breath sounds normal. No respiratory distress. She has no wheezes. She exhibits no tenderness.  Abdominal: Soft. Bowel sounds are normal. She exhibits no distension and no mass. There is no tenderness. There is no rebound and no guarding.  Musculoskeletal: Normal range of motion. She exhibits tenderness. She exhibits no edema.  There is tenderness to palpation in the paraspinal areas throughout her back.  Lymphadenopathy:    She has no cervical adenopathy.  Neurological: She is alert and oriented to person, place, and time. No cranial nerve deficit. She exhibits normal muscle tone.  Normal strength  and sensation 4 extremities.  Skin: Skin is warm and dry. No rash noted. She is not diaphoretic. No erythema. No pallor.  Nursing note and vitals reviewed.   ED Course  Procedures (including critical care time) Labs Review Labs Reviewed - No data to display  Imaging Review No results found.   EKG Interpretation None      MDM   Final diagnoses:  None    Patient since emergency department for acute on chronic back pain. She has been taking NSAIDs without relief. She was given another dose of Motrin as well as Norco in emergency department. She is advised to get an outpatient MRI to evaluate for any worsening of her arthritis and joint disease. She'll be discharged with prescriptions. There has been no new trauma and no red flag symptoms for back pain suggesting an emergent underlying  ideology.  Her vital signs remain within her normal limits and she is safe for discharge.    Everlene Balls, MD 01/29/14 [phone]

## 2014-01-29 NOTE — Discharge Instructions (Signed)
Back Exercises Krista Hodges, you were seen today for worsening back pain. Take medication as prescribed and follow-up with her primary care physician within 3 days. If symptoms worsen come back to emergency department immediately for repeat evaluation. Thank you. Back exercises help treat and prevent back injuries. The goal is to increase your strength in your belly (abdominal) and back muscles. These exercises can also help with flexibility. Start these exercises when told by your doctor. HOME CARE Back exercises include: Pelvic Tilt.  Lie on your back with your knees bent. Tilt your pelvis until the lower part of your back is against the floor. Hold this position 5 to 10 sec. Repeat this exercise 5 to 10 times. Knee to Chest.  Pull 1 knee up against your chest and hold for 20 to 30 seconds. Repeat this with the other knee. This may be done with the other leg straight or bent, whichever feels better. Then, pull both knees up against your chest. Sit-Ups or Curl-Ups.  Bend your knees 90 degrees. Start with tilting your pelvis, and do a partial, slow sit-up. Only lift your upper half 30 to 45 degrees off the floor. Take at least 2 to 3 seonds for each sit-up. Do not do sit-ups with your knees out straight. If partial sit-ups are difficult, simply do the above but with only tightening your belly (abdominal) muscles and holding it as told. Hip-Lift.  Lie on your back with your knees flexed 90 degrees. Push down with your feet and shoulders as you raise your hips 2 inches off the floor. Hold for 10 seconds, repeat 5 to 10 times. Back Arches.  Lie on your stomach. Prop yourself up on bent elbows. Slowly press on your hands, causing an arch in your low back. Repeat 3 to 5 times. Shoulder-Lifts.  Lie face down with arms beside your body. Keep hips and belly pressed to floor as you slowly lift your head and shoulders off the floor. Do not overdo your exercises. Be careful in the beginning. Exercises may  cause you some mild back discomfort. If the pain lasts for more than 15 minutes, stop the exercises until you see your doctor. Improvement with exercise for back problems is slow.  Document Released: 03/03/2010 Document Revised: 04/23/2011 Document Reviewed: 11/30/2010 [facility] Patient Information 2015 [place], [place]. This information is not intended to replace advice given to you by your health care provider. Make sure you discuss any questions you have with your health care provider. Back Injury Prevention The following tips can help you to prevent a back injury. PHYSICAL FITNESS  Exercise often. Try to develop strong stomach (abdominal) muscles.  Do aerobic exercises often. This includes walking, jogging, biking, swimming.  Do exercises that help with balance and strength often. This includes tai chi and yoga.  Stretch before and after you exercise.  Keep a healthy weight. DIET   Ask your doctor how much calcium and vitamin D you need every day.  Include calcium in your diet. Foods high in calcium include dairy products; green, leafy vegetables; and products with calcium added (fortified).  Include vitamin D in your diet. Foods high in vitamin D include milk and products with vitamin D added.  Think about taking a multivitamin or other nutritional products called " supplements."  Stop smoking if you smoke. POSTURE   Sit and stand up straight. Avoid leaning forward or hunching over.  Choose chairs that support your lower back.  If you work at a desk:  Sit close to  your work so you do not lean over.  Keep your chin tucked in.  Keep your neck drawn back.  Keep your elbows bent at a right angle. Your arms should look like the letter "L."  Sit high and close to the steering wheel when you drive. Add low back support to your car seat if needed.  Avoid sitting or standing in one position for too long. Get up and move around every hour. Take breaks if you are driving for a  long time.  Sleep on your side with your knees slightly bent. You can also sleep on your back with a pillow under your knees. Do not sleep on your stomach. LIFTING, TWISTING, AND REACHING  Avoid heavy lifting, especially lifting over and over again. If you must do heavy lifting:  Stretch before lifting.  Work slowly.  Rest between lifts.  Use carts and dollies to move objects when possible.  Make several small trips instead of carrying 1 heavy load.  Ask for help when you need it.  Ask for help when moving big, awkward objects.  Follow these steps when lifting:  Stand with your feet shoulder-width apart.  Get as close to the object as you can. Do not pick up heavy objects that are far from your body.  Use handles or lifting straps when possible.  Bend at your knees. Squat down, but keep your heels off the floor.  Keep your shoulders back, your chin tucked in, and your back straight.  Lift the object slowly. Tighten the muscles in your legs, stomach, and butt. Keep the object as close to the center of your body as possible.  Reverse these directions when you put a load down.  Do not:  Lift the object above your waist.  Twist at the waist while lifting or carrying a load. Move your feet if you need to turn, not your waist.  Bend over without bending at your knees.  Avoid reaching over your head, across a table, or for an object on a high surface. OTHER TIPS  Avoid wet floors and keep sidewalks clear of ice.  Do not sleep on a mattress that is too soft or too hard.  Keep items that you use often within easy reach.  Put heavier objects on shelves at waist level. Put lighter objects on lower or higher shelves.  Find ways to lessen your stress. You can try exercise, massage, or relaxation.  Get help for depression or anxiety if needed. GET HELP IF:  You injure your back.  You have questions about diet, exercise, or other ways to prevent back injuries. MAKE  SURE YOU:  Understand these instructions.  Will watch your condition.  Will get help right away if you are not doing well or get worse. Document Released: 07/18/2007 Document Revised: 04/23/2011 Document Reviewed: 03/12/2011 [facility] Patient Information 2015 [place], [place]. This information is not intended to replace advice given to you by your health care provider. Make sure you discuss any questions you have with your health care provider.

## 2014-01-29 NOTE — ED Notes (Signed)
Pt states she is having neck and right sided back pain that started on Sunday after taking boxes out of the attic  Pt states she was standing on a ladder and the boxes were heavy  Pt states the pain has progressively been getting worse since sunday

## 2014-04-02 ENCOUNTER — Encounter (HOSPITAL_COMMUNITY): Payer: Self-pay | Admitting: *Deleted

## 2014-04-02 DIAGNOSIS — I10 Essential (primary) hypertension: Secondary | ICD-10-CM | POA: Diagnosis present

## 2014-04-02 DIAGNOSIS — E785 Hyperlipidemia, unspecified: Secondary | ICD-10-CM | POA: Diagnosis present

## 2014-04-02 DIAGNOSIS — R5382 Chronic fatigue, unspecified: Secondary | ICD-10-CM | POA: Diagnosis present

## 2014-04-02 DIAGNOSIS — Z79891 Long term (current) use of opiate analgesic: Secondary | ICD-10-CM

## 2014-04-02 DIAGNOSIS — Z87891 Personal history of nicotine dependence: Secondary | ICD-10-CM

## 2014-04-02 DIAGNOSIS — M1990 Unspecified osteoarthritis, unspecified site: Secondary | ICD-10-CM | POA: Diagnosis present

## 2014-04-02 DIAGNOSIS — F411 Generalized anxiety disorder: Secondary | ICD-10-CM | POA: Diagnosis present

## 2014-04-02 DIAGNOSIS — N2889 Other specified disorders of kidney and ureter: Secondary | ICD-10-CM | POA: Diagnosis present

## 2014-04-02 DIAGNOSIS — Z888 Allergy status to other drugs, medicaments and biological substances status: Secondary | ICD-10-CM

## 2014-04-02 DIAGNOSIS — Z809 Family history of malignant neoplasm, unspecified: Secondary | ICD-10-CM

## 2014-04-02 DIAGNOSIS — Z833 Family history of diabetes mellitus: Secondary | ICD-10-CM

## 2014-04-02 DIAGNOSIS — Z79899 Other long term (current) drug therapy: Secondary | ICD-10-CM

## 2014-04-02 DIAGNOSIS — G2581 Restless legs syndrome: Secondary | ICD-10-CM | POA: Diagnosis present

## 2014-04-02 DIAGNOSIS — K850 Idiopathic acute pancreatitis: Secondary | ICD-10-CM | POA: Diagnosis not present

## 2014-04-02 DIAGNOSIS — N390 Urinary tract infection, site not specified: Secondary | ICD-10-CM | POA: Diagnosis present

## 2014-04-02 DIAGNOSIS — F341 Dysthymic disorder: Secondary | ICD-10-CM | POA: Diagnosis present

## 2014-04-02 DIAGNOSIS — Z880 Allergy status to penicillin: Secondary | ICD-10-CM

## 2014-04-02 DIAGNOSIS — Z8672 Personal history of thrombophlebitis: Secondary | ICD-10-CM

## 2014-04-02 DIAGNOSIS — R1013 Epigastric pain: Secondary | ICD-10-CM | POA: Diagnosis not present

## 2014-04-02 DIAGNOSIS — Z791 Long term (current) use of non-steroidal anti-inflammatories (NSAID): Secondary | ICD-10-CM

## 2014-04-02 DIAGNOSIS — Z8249 Family history of ischemic heart disease and other diseases of the circulatory system: Secondary | ICD-10-CM

## 2014-04-02 DIAGNOSIS — Z7982 Long term (current) use of aspirin: Secondary | ICD-10-CM

## 2014-04-02 LAB — CBC WITH DIFFERENTIAL/PLATELET
Basophils Absolute: 0 10*3/uL (ref 0.0–0.1)
Basophils Relative: 0 % (ref 0–1)
EOS ABS: 0.1 10*3/uL (ref 0.0–0.7)
EOS PCT: 1 % (ref 0–5)
HCT: 47.1 % — ABNORMAL HIGH (ref 36.0–46.0)
HEMOGLOBIN: 15.9 g/dL — AB (ref 12.0–15.0)
Lymphocytes Relative: 13 % (ref 12–46)
Lymphs Abs: 1.6 10*3/uL (ref 0.7–4.0)
MCH: 31.5 pg (ref 26.0–34.0)
MCHC: 33.8 g/dL (ref 30.0–36.0)
MCV: 93.3 fL (ref 78.0–100.0)
MONOS PCT: 6 % (ref 3–12)
Monocytes Absolute: 0.7 10*3/uL (ref 0.1–1.0)
NEUTROS PCT: 80 % — AB (ref 43–77)
Neutro Abs: 9.8 10*3/uL — ABNORMAL HIGH (ref 1.7–7.7)
Platelets: 219 10*3/uL (ref 150–400)
RBC: 5.05 MIL/uL (ref 3.87–5.11)
RDW: 12.6 % (ref 11.5–15.5)
WBC: 12.4 10*3/uL — ABNORMAL HIGH (ref 4.0–10.5)

## 2014-04-02 LAB — COMPREHENSIVE METABOLIC PANEL
ALBUMIN: 4.3 g/dL (ref 3.5–5.2)
ALT: 30 U/L (ref 0–35)
ANION GAP: 8 (ref 5–15)
AST: 24 U/L (ref 0–37)
Alkaline Phosphatase: 73 U/L (ref 39–117)
BUN: 19 mg/dL (ref 6–23)
CALCIUM: 10 mg/dL (ref 8.4–10.5)
CO2: 27 mmol/L (ref 19–32)
Chloride: 102 mmol/L (ref 96–112)
Creatinine, Ser: 1.02 mg/dL (ref 0.50–1.10)
GFR calc non Af Amer: 59 mL/min — ABNORMAL LOW (ref >90)
GFR, EST AFRICAN AMERICAN: 68 mL/min — AB (ref >90)
Glucose, Bld: 95 mg/dL (ref 70–99)
Potassium: 3.8 mmol/L (ref 3.5–5.1)
SODIUM: 137 mmol/L (ref 135–145)
Total Bilirubin: 0.5 mg/dL (ref 0.3–1.2)
Total Protein: 7.1 g/dL (ref 6.0–8.3)

## 2014-04-02 LAB — LIPASE, BLOOD: Lipase: 671 U/L — ABNORMAL HIGH (ref 11–59)

## 2014-04-02 NOTE — ED Notes (Signed)
Pt sent for eval of acute pancreatitis. Lipase 1679 at dr office

## 2014-04-02 NOTE — ED Notes (Signed)
Pt reports onset of mid abd pain last night, denies n/v. Went to pcp today and was told to come here for acute pancreatitis.

## 2014-04-02 NOTE — ED Notes (Signed)
Pt unable to urinate at this time.  

## 2014-04-03 ENCOUNTER — Observation Stay (HOSPITAL_COMMUNITY): Payer: BLUE CROSS/BLUE SHIELD

## 2014-04-03 ENCOUNTER — Inpatient Hospital Stay (HOSPITAL_COMMUNITY): Payer: BLUE CROSS/BLUE SHIELD

## 2014-04-03 ENCOUNTER — Inpatient Hospital Stay (HOSPITAL_COMMUNITY)
Admission: EM | Admit: 2014-04-03 | Discharge: 2014-04-05 | DRG: 439 | Disposition: A | Discharge status: Home / Self Care | Payer: BLUE CROSS/BLUE SHIELD | Attending: Internal Medicine | Admitting: Internal Medicine

## 2014-04-03 DIAGNOSIS — K859 Acute pancreatitis without necrosis or infection, unspecified: Secondary | ICD-10-CM

## 2014-04-03 DIAGNOSIS — N2889 Other specified disorders of kidney and ureter: Secondary | ICD-10-CM | POA: Diagnosis present

## 2014-04-03 DIAGNOSIS — Z791 Long term (current) use of non-steroidal anti-inflammatories (NSAID): Secondary | ICD-10-CM | POA: Diagnosis not present

## 2014-04-03 DIAGNOSIS — I10 Essential (primary) hypertension: Secondary | ICD-10-CM | POA: Diagnosis present

## 2014-04-03 DIAGNOSIS — R1013 Epigastric pain: Secondary | ICD-10-CM | POA: Diagnosis present

## 2014-04-03 DIAGNOSIS — K858 Other acute pancreatitis without necrosis or infection: Secondary | ICD-10-CM | POA: Diagnosis present

## 2014-04-03 DIAGNOSIS — N390 Urinary tract infection, site not specified: Secondary | ICD-10-CM | POA: Diagnosis present

## 2014-04-03 DIAGNOSIS — Z8672 Personal history of thrombophlebitis: Secondary | ICD-10-CM | POA: Diagnosis not present

## 2014-04-03 DIAGNOSIS — Z880 Allergy status to penicillin: Secondary | ICD-10-CM | POA: Diagnosis not present

## 2014-04-03 DIAGNOSIS — G2581 Restless legs syndrome: Secondary | ICD-10-CM | POA: Diagnosis present

## 2014-04-03 DIAGNOSIS — Z888 Allergy status to other drugs, medicaments and biological substances status: Secondary | ICD-10-CM | POA: Diagnosis not present

## 2014-04-03 DIAGNOSIS — M1990 Unspecified osteoarthritis, unspecified site: Secondary | ICD-10-CM | POA: Diagnosis present

## 2014-04-03 DIAGNOSIS — Z79899 Other long term (current) drug therapy: Secondary | ICD-10-CM | POA: Diagnosis not present

## 2014-04-03 DIAGNOSIS — K8689 Other specified diseases of pancreas: Secondary | ICD-10-CM

## 2014-04-03 DIAGNOSIS — Z79891 Long term (current) use of opiate analgesic: Secondary | ICD-10-CM | POA: Diagnosis not present

## 2014-04-03 DIAGNOSIS — Z809 Family history of malignant neoplasm, unspecified: Secondary | ICD-10-CM | POA: Diagnosis not present

## 2014-04-03 DIAGNOSIS — K8500 Idiopathic acute pancreatitis without necrosis or infection: Secondary | ICD-10-CM

## 2014-04-03 DIAGNOSIS — Z833 Family history of diabetes mellitus: Secondary | ICD-10-CM | POA: Diagnosis not present

## 2014-04-03 DIAGNOSIS — Z7982 Long term (current) use of aspirin: Secondary | ICD-10-CM | POA: Diagnosis not present

## 2014-04-03 DIAGNOSIS — Z87891 Personal history of nicotine dependence: Secondary | ICD-10-CM | POA: Diagnosis not present

## 2014-04-03 DIAGNOSIS — R5382 Chronic fatigue, unspecified: Secondary | ICD-10-CM | POA: Diagnosis present

## 2014-04-03 DIAGNOSIS — F411 Generalized anxiety disorder: Secondary | ICD-10-CM | POA: Diagnosis present

## 2014-04-03 DIAGNOSIS — Z8249 Family history of ischemic heart disease and other diseases of the circulatory system: Secondary | ICD-10-CM | POA: Diagnosis not present

## 2014-04-03 DIAGNOSIS — E785 Hyperlipidemia, unspecified: Secondary | ICD-10-CM | POA: Diagnosis present

## 2014-04-03 DIAGNOSIS — F341 Dysthymic disorder: Secondary | ICD-10-CM | POA: Diagnosis present

## 2014-04-03 LAB — CBC WITH DIFFERENTIAL/PLATELET
BASOS PCT: 1 % (ref 0–1)
Basophils Absolute: 0 10*3/uL (ref 0.0–0.1)
Eosinophils Absolute: 0.1 10*3/uL (ref 0.0–0.7)
Eosinophils Relative: 2 % (ref 0–5)
HCT: 37.2 % (ref 36.0–46.0)
Hemoglobin: 12.1 g/dL (ref 12.0–15.0)
LYMPHS ABS: 1.5 10*3/uL (ref 0.7–4.0)
Lymphocytes Relative: 23 % (ref 12–46)
MCH: 31 pg (ref 26.0–34.0)
MCHC: 32.5 g/dL (ref 30.0–36.0)
MCV: 95.4 fL (ref 78.0–100.0)
MONOS PCT: 6 % (ref 3–12)
Monocytes Absolute: 0.4 10*3/uL (ref 0.1–1.0)
NEUTROS ABS: 4.4 10*3/uL (ref 1.7–7.7)
NEUTROS PCT: 68 % (ref 43–77)
PLATELETS: 201 10*3/uL (ref 150–400)
RBC: 3.9 MIL/uL (ref 3.87–5.11)
RDW: 12.5 % (ref 11.5–15.5)
WBC: 6.5 10*3/uL (ref 4.0–10.5)

## 2014-04-03 LAB — COMPREHENSIVE METABOLIC PANEL
ALBUMIN: 3.4 g/dL — AB (ref 3.5–5.2)
ALT: 20 U/L (ref 0–35)
ANION GAP: 7 (ref 5–15)
AST: 17 U/L (ref 0–37)
Alkaline Phosphatase: 60 U/L (ref 39–117)
BILIRUBIN TOTAL: 0.6 mg/dL (ref 0.3–1.2)
BUN: 11 mg/dL (ref 6–23)
CHLORIDE: 110 mmol/L (ref 96–112)
CO2: 25 mmol/L (ref 19–32)
Calcium: 9.4 mg/dL (ref 8.4–10.5)
Creatinine, Ser: 0.88 mg/dL (ref 0.50–1.10)
GFR calc non Af Amer: 70 mL/min — ABNORMAL LOW (ref >90)
GFR, EST AFRICAN AMERICAN: 81 mL/min — AB (ref >90)
Glucose, Bld: 89 mg/dL (ref 70–99)
Potassium: 3.9 mmol/L (ref 3.5–5.1)
SODIUM: 142 mmol/L (ref 135–145)
Total Protein: 5.7 g/dL — ABNORMAL LOW (ref 6.0–8.3)

## 2014-04-03 LAB — URINALYSIS, ROUTINE W REFLEX MICROSCOPIC
Bilirubin Urine: NEGATIVE
Glucose, UA: NEGATIVE mg/dL
KETONES UR: NEGATIVE mg/dL
Nitrite: NEGATIVE
Protein, ur: NEGATIVE mg/dL
Specific Gravity, Urine: 1.025 (ref 1.005–1.030)
UROBILINOGEN UA: 0.2 mg/dL (ref 0.0–1.0)
pH: 6 (ref 5.0–8.0)

## 2014-04-03 LAB — LIPID PANEL
Cholesterol: 134 mg/dL (ref 0–200)
HDL: 41 mg/dL (ref >39)
LDL CALC: 66 mg/dL (ref 0–99)
TRIGLYCERIDES: 137 mg/dL (ref <150)
Total CHOL/HDL Ratio: 3.3 RATIO
VLDL: 27 mg/dL (ref 0–40)

## 2014-04-03 LAB — URINE MICROSCOPIC-ADD ON

## 2014-04-03 LAB — ETHANOL

## 2014-04-03 LAB — LIPASE, BLOOD: Lipase: 53 U/L (ref 11–59)

## 2014-04-03 MED ORDER — SODIUM CHLORIDE 0.9 % IV SOLN
INTRAVENOUS | Status: DC
  Administered 2014-04-03 (×2): via INTRAVENOUS
  Administered 2014-04-03: 125 mL/h via INTRAVENOUS
  Administered 2014-04-04 (×3): via INTRAVENOUS
  Administered 2014-04-05: 1000 mL via INTRAVENOUS

## 2014-04-03 MED ORDER — BUPROPION HCL ER (XL) 150 MG PO TB24
150.0000 mg | ORAL_TABLET | Freq: Two times a day (BID) | ORAL | Status: DC
  Administered 2014-04-03 – 2014-04-05 (×5): 150 mg via ORAL
  Filled 2014-04-03 (×6): qty 1

## 2014-04-03 MED ORDER — MORPHINE SULFATE 2 MG/ML IJ SOLN: 2.0000 mg | INTRAMUSCULAR | Status: DC | PRN

## 2014-04-03 MED ORDER — LORAZEPAM 2 MG/ML IJ SOLN
1.0000 mg | Freq: Once | INTRAMUSCULAR | Status: AC
  Administered 2014-04-03: 1 mg via INTRAVENOUS
  Filled 2014-04-03: qty 1

## 2014-04-03 MED ORDER — CEFTRIAXONE SODIUM IN DEXTROSE 20 MG/ML IV SOLN
1.0000 g | INTRAVENOUS | Status: DC
  Administered 2014-04-03: 1 g via INTRAVENOUS
  Filled 2014-04-03 (×2): qty 50

## 2014-04-03 MED ORDER — IBUPROFEN 200 MG PO TABS: 400.0000 mg | ORAL_TABLET | Freq: Four times a day (QID) | ORAL | Status: DC | PRN

## 2014-04-03 MED ORDER — TIZANIDINE HCL 4 MG PO TABS
4.0000 mg | ORAL_TABLET | Freq: Four times a day (QID) | ORAL | Status: DC | PRN
  Filled 2014-04-03: qty 2

## 2014-04-03 MED ORDER — ONDANSETRON HCL 4 MG/2ML IJ SOLN: 4.0000 mg | Freq: Four times a day (QID) | INTRAMUSCULAR | Status: DC | PRN

## 2014-04-03 MED ORDER — ONDANSETRON HCL 4 MG PO TABS: 4.0000 mg | ORAL_TABLET | Freq: Four times a day (QID) | ORAL | Status: DC | PRN

## 2014-04-03 MED ORDER — ALPRAZOLAM 0.25 MG PO TABS: 0.2500 mg | ORAL_TABLET | Freq: Two times a day (BID) | ORAL | Status: DC | PRN

## 2014-04-03 MED ORDER — MELOXICAM 15 MG PO TABS
15.0000 mg | ORAL_TABLET | Freq: Every day | ORAL | Status: DC
  Administered 2014-04-03 – 2014-04-05 (×3): 15 mg via ORAL
  Filled 2014-04-03 (×3): qty 1

## 2014-04-03 MED ORDER — ASPIRIN 81 MG PO CHEW
81.0000 mg | CHEWABLE_TABLET | Freq: Every day | ORAL | Status: DC
  Administered 2014-04-03 – 2014-04-05 (×3): 81 mg via ORAL
  Filled 2014-04-03 (×3): qty 1

## 2014-04-03 MED ORDER — SERTRALINE HCL 50 MG PO TABS
150.0000 mg | ORAL_TABLET | Freq: Two times a day (BID) | ORAL | Status: DC
  Administered 2014-04-03 – 2014-04-05 (×5): 150 mg via ORAL
  Filled 2014-04-03 (×7): qty 1

## 2014-04-03 MED ORDER — BUSPIRONE HCL 15 MG PO TABS
15.0000 mg | ORAL_TABLET | Freq: Two times a day (BID) | ORAL | Status: DC
  Administered 2014-04-03 – 2014-04-05 (×5): 15 mg via ORAL
  Filled 2014-04-03 (×7): qty 1

## 2014-04-03 MED ORDER — EZETIMIBE-SIMVASTATIN 10-40 MG PO TABS
1.0000 | ORAL_TABLET | Freq: Every day | ORAL | Status: DC
  Administered 2014-04-03 – 2014-04-05 (×3): 1 via ORAL
  Filled 2014-04-03 (×3): qty 1

## 2014-04-03 MED ORDER — HEPARIN SODIUM (PORCINE) 5000 UNIT/ML IJ SOLN
5000.0000 [IU] | Freq: Three times a day (TID) | INTRAMUSCULAR | Status: DC
  Administered 2014-04-03 – 2014-04-05 (×7): 5000 [IU] via SUBCUTANEOUS
  Filled 2014-04-03 (×7): qty 1

## 2014-04-03 MED ORDER — PROPRANOLOL HCL 10 MG PO TABS
10.0000 mg | ORAL_TABLET | Freq: Four times a day (QID) | ORAL | Status: DC | PRN
  Filled 2014-04-03: qty 1

## 2014-04-03 MED ORDER — IRBESARTAN 300 MG PO TABS
300.0000 mg | ORAL_TABLET | Freq: Every day | ORAL | Status: DC
  Administered 2014-04-03 – 2014-04-05 (×3): 300 mg via ORAL
  Filled 2014-04-03 (×3): qty 1

## 2014-04-03 MED ORDER — PRAMIPEXOLE DIHYDROCHLORIDE 0.25 MG PO TABS
0.5000 mg | ORAL_TABLET | Freq: Every day | ORAL | Status: DC
  Administered 2014-04-03 – 2014-04-04 (×2): 0.5 mg via ORAL
  Filled 2014-04-03 (×3): qty 2

## 2014-04-03 NOTE — ED Notes (Addendum)
Pt c/o upper abdominal pain x 2 days. Denies NV. Reports having labs drawn at PCP's office and referred to ED for acute pancreatitis.

## 2014-04-03 NOTE — ED Provider Notes (Signed)
CSN: [id number]     Arrival date & time 04/02/14  1836 History   First MD Initiated Contact with Patient 04/03/14 0012     Chief Complaint  Patient presents with  . Abdominal Pain     (Consider location/radiation/quality/duration/timing/severity/associated sxs/prior Treatment) HPI Comments: Patient is a 59 year old female with a past medical history of arthritis who presents with abdominal pain for the past 2 days. The pain is located in the epigastrium and does not radiate. The pain is described as aching and severe. The pain started gradually and progressively worsened since the onset. No alleviating/aggravating factors. The patient has tried nothing for symptoms without relief. Associated symptoms include nothing. Patient denies fever, headache, NVD, chest pain, SOB, dysuria, constipation, abnormal vaginal bleeding/discharge. Patient reports drinking occasionally.      Past Medical History  Diagnosis Date  . Atypical migraine   . HLD (hyperlipidemia)   . Premature complex, ventricular   . 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 Essential (primary) hypertension   . Allergic rhinitis   . Anxiety state   . Enthesopathy of hip   . Cervical radiculitis   . Depression, neurotic   . CFIDS (chronic fatigue and immune dysfunction syndrome)   . Inflammation of hand joint   . Excessive sweating, local   . Depression, major, single episode, in partial remission   . Amnesia   . Adiposity   . Arthritis, degenerative   . Awareness of heartbeats   . Restless leg   . Adaptation reaction   . Avitaminosis D    Past Surgical History  Procedure Laterality Date  . C sections x 3     . Mandible surgery     Family History  Problem Relation Age of Onset  . Cancer Mother   . Heart attack Father   . Diabetes Other    History  Substance Use Topics  . Smoking status: Former Research scientist (life sciences)  . Smokeless tobacco: Not on file  . Alcohol Use: Yes     Comment: rare   OB History     No data available     Review of Systems  Constitutional: Negative for fever, chills and fatigue.  HENT: Negative for trouble swallowing.   Eyes: Negative for visual disturbance.  Respiratory: Negative for shortness of breath.   Cardiovascular: Negative for chest pain and palpitations.  Gastrointestinal: Positive for abdominal pain. Negative for nausea, vomiting and diarrhea.  Genitourinary: Negative for dysuria and difficulty urinating.  Musculoskeletal: Negative for arthralgias and neck pain.  Skin: Negative for color change.  Neurological: Negative for dizziness and weakness.  Psychiatric/Behavioral: Negative for dysphoric mood.      Allergies  Penicillins and Requip  Home Medications   Prior to Admission medications   Medication Sig Start Date End Date Taking? Authorizing Provider  ALPRAZolam (XANAX) 0.25 MG tablet Take 0.25 mg by mouth 2 (two) times daily as needed for anxiety.   Yes Historical Provider, MD  aspirin 81 MG tablet Take 81 mg by mouth daily.   Yes Historical Provider, MD  buPROPion (WELLBUTRIN XL) 150 MG 24 hr tablet Take 150 mg by mouth 2 (two) times daily.   Yes Historical Provider, MD  busPIRone (BUSPAR) 15 MG tablet Take 15 mg by mouth 2 (two) times daily.   Yes Historical Provider, MD  Cholecalciferol (VITAMIN D3) 10000 UNITS capsule Take 30,000 Units by mouth 3 (three) times a week. Mon, Wed, Fri   Yes Historical Provider, MD  ezetimibe-simvastatin (VYTORIN) 10-40  MG per tablet Take 1 tablet by mouth daily.   Yes Historical Provider, MD  hydrochlorothiazide (HYDRODIURIL) 25 MG tablet Take 25 mg by mouth daily.   Yes Historical Provider, MD  ibuprofen (ADVIL,MOTRIN) 200 MG tablet Take 400 mg by mouth every 6 (six) hours as needed for moderate pain.   Yes Historical Provider, MD  irbesartan (AVAPRO) 300 MG tablet Take 300 mg by mouth daily.   Yes Historical Provider, MD  isometheptene-acetaminophen-dichloralphenazone (MIDRIN) 65-325-100 MG capsule Take 1  capsule by mouth every 4 (four) hours as needed for migraine. Maximum 5 capsules in 12 hours for migraine headaches, 8 capsules in 24 hours for tension headaches.   Yes Historical Provider, MD  meloxicam (MOBIC) 15 MG tablet Take 15 mg by mouth daily.   Yes Historical Provider, MD  Multiple Vitamins-Minerals (HAIR/SKIN/NAILS PO) Take by mouth daily.   Yes Historical Provider, MD  pramipexole (MIRAPEX) 0.5 MG tablet Take 0.5 mg by mouth at bedtime.   Yes Historical Provider, MD  propranolol (INDERAL) 10 MG tablet Take 1 tablet (10 mg total) by mouth every 6 (six) hours as needed (for palpitations). 10/23/13  Yes Josue Hector, MD  sertraline (ZOLOFT) 100 MG tablet Take 150 mg by mouth 2 (two) times daily.    Yes Historical Provider, MD  tiZANidine (ZANAFLEX) 4 MG tablet Take 4-8 mg by mouth every 6 (six) hours as needed for muscle spasms.   Yes Historical Provider, MD  traMADol (ULTRAM) 50 MG tablet Take 50 mg by mouth every 6 (six) hours as needed (Neck Pain).    Yes Historical Provider, MD  HYDROcodone-acetaminophen (NORCO/VICODIN) 5-325 MG per tablet Take 2 tablets by mouth 3 (three) times daily as needed for severe pain. Patient not taking: Reported on 04/02/2014 01/29/14   Everlene Balls, MD   BP 129/76 mmHg  Pulse 109  Temp(Src) 97.5 F (36.4 C) (Oral)  Resp 18  SpO2 94% Physical Exam  Constitutional: She is oriented to person, place, and time. She appears well-developed and well-nourished. No distress.  HENT:  Head: Normocephalic and atraumatic.  Eyes: Conjunctivae and EOM are normal.  Neck: Normal range of motion.  Cardiovascular: Normal rate and regular rhythm.  Exam reveals no gallop and no friction rub.   No murmur heard. Pulmonary/Chest: Effort normal and breath sounds normal. She has no wheezes. She has no rales. She exhibits no tenderness.  Abdominal: Soft. She exhibits no distension. There is tenderness. There is no rebound and no guarding.  Epigastric tenderness to palpation. No  other focal tenderness to palpation or peritoneal signs.   Musculoskeletal: Normal range of motion.  Neurological: She is alert and oriented to person, place, and time. Coordination normal.  Speech is goal-oriented. Moves limbs without ataxia.   Skin: Skin is warm and dry.  Psychiatric: She has a normal mood and affect. Her behavior is normal.  Nursing note and vitals reviewed.   ED Course  Procedures (including critical care time) Labs Review Labs Reviewed  CBC WITH DIFFERENTIAL/PLATELET - Abnormal; Notable for the following:    WBC 12.4 (*)    Hemoglobin 15.9 (*)    HCT 47.1 (*)    Neutrophils Relative % 80 (*)    Neutro Abs 9.8 (*)    All other components within normal limits  COMPREHENSIVE METABOLIC PANEL - Abnormal; Notable for the following:    GFR calc non Af Amer 59 (*)    GFR calc Af Amer 68 (*)    All other components within normal limits  LIPASE, BLOOD - Abnormal; Notable for the following:    Lipase 671 (*)    All other components within normal limits  URINALYSIS, ROUTINE W REFLEX MICROSCOPIC - Abnormal; Notable for the following:    Hgb urine dipstick TRACE (*)    Leukocytes, UA LARGE (*)    All other components within normal limits  URINE MICROSCOPIC-ADD ON - Abnormal; Notable for the following:    Bacteria, UA FEW (*)    All other components within normal limits    Imaging Review No results found.   EKG Interpretation None      MDM   Final diagnoses:  Idiopathic acute pancreatitis    12:29 AM Labs show elevated lipase of 671. Patient reports occasional alcohol use. Patient mildly tachycardic with remaining vitals stable. Patient will be admitted. She sees [facility].   [address] [place], PA-C 04/03/14 6301  Julianne Rice, MD 04/03/14 [phone]

## 2014-04-03 NOTE — ED Notes (Signed)
Attempted report 

## 2014-04-03 NOTE — Progress Notes (Signed)
UR completed 

## 2014-04-03 NOTE — Progress Notes (Signed)
[facility] PROGRESS NOTE  DONIKA BUTNER BCW:[id number] DOB: 02/26/1955 DOA: 04/03/2014 PCP: Henrine Screws, MD  Assessment/Plan: Acute pancreatitis  - unclear cause, patient does drink occassionally. -Abdominal ultrasound pending -Lipid panel pending -Lipase pending -Continue Pain control with morphine PRN -Continue NPO except meds and ice chips -Continue normal saline 125 ml/hr   HTN  - continue home meds except HCTZ  UTI -Continue ceftriaxone    Code Status: Full Family Communication: Family present Disposition Plan: Resolution acute pancreatitis   Consultants: NA  Procedures: NA  Cultures 2/20 urine appears was not sent.   Antibiotics: Ceftriaxone 2/20>>   DVT prophylaxis Subcutaneous heparin   HPI/Subjective: Krista Hodges is a 59 y.o. WF PMHx depression, anxiety, atypical migraine, chronic fatigue immune dysfunction syndrome, RLS, HLD, PVC, Essential HTN, thrombophlebitis superficial veins lower extremity female who presents to the ED with c/o abdominal pain. Pain is located in her epigastric area, no radiation. Pain is aching and severe. Onset 2 days ago, gradually worsening until today. Better this evening on its own somewhat. Saw [facility] yesterday (follows with them for colonoscopy), they got a lipase and it was over 1600 in the office. They sent patient to ED for bowel rest and hydration. 2/20 A/O 4, NAD, negative N/V. Lipase decreased to 53 U/L   Objective: Filed Vitals:   04/03/14 0330 04/03/14 0446 04/03/14 0525 04/03/14 1444  BP: 115/61 116/65 121/62 117/56  Pulse: 80 76 74   Temp:  97.8 F (36.6 C) 98.4 F (36.9 C) 97.8 F (36.6 C)  TempSrc:  Oral Oral Oral  Resp:  19 16 16   SpO2: 96% 95% 96% 96%   No intake or output data in the 24 hours ending 04/03/14 1716 There were no vitals filed for this visit.   Exam: General: A/O 4, NAD, No acute respiratory distress Lungs: Clear to auscultation bilaterally without  wheezes or crackles Cardiovascular: Regular rate and rhythm without murmur gallop or rub normal S1 and S2 Abdomen: Nontender, nondistended, soft, bowel sounds positive, no rebound, no ascites, no appreciable mass Extremities: No significant cyanosis, clubbing, or edema bilateral lower extremities   Data Reviewed: Basic Metabolic Panel:  Recent Labs Lab 04/02/14 1914  NA 137  K 3.8  CL 102  CO2 27  GLUCOSE 95  BUN 19  CREATININE 1.02  CALCIUM 10.0   Liver Function Tests:  Recent Labs Lab 04/02/14 1914  AST 24  ALT 30  ALKPHOS 73  BILITOT 0.5  PROT 7.1  ALBUMIN 4.3    Recent Labs Lab 04/02/14 1914  LIPASE 671*   No results for input(s): AMMONIA in the last 168 hours. CBC:  Recent Labs Lab 04/02/14 1914  WBC 12.4*  NEUTROABS 9.8*  HGB 15.9*  HCT 47.1*  MCV 93.3  PLT 219   Cardiac Enzymes: No results for input(s): CKTOTAL, CKMB, CKMBINDEX, TROPONINI in the last 168 hours. BNP (last 3 results) No results for input(s): BNP in the last 8760 hours.  ProBNP (last 3 results) No results for input(s): PROBNP in the last 8760 hours.  CBG: No results for input(s): GLUCAP in the last 168 hours.  No results found for this or any previous visit (from the past 240 hour(s)).   Studies: US Abdomen Complete  04/03/2014   CLINICAL DATA:  Acute pancreatitis  EXAM: ULTRASOUND ABDOMEN COMPLETE  COMPARISON:  None.  FINDINGS: Gallbladder: No gallstones or wall thickening visualized. No sonographic Murphy sign noted.  Common bile duct: Diameter: 4.0 mm  Liver: Heterogeneous liver echogenicity  likely due to geographic fatty infiltration. No focal hepatic lesions or intrahepatic biliary dilatation.  IVC: Normal caliber  Pancreas: Heterogeneous echogenicity. No focal fluid collections. There is a rounded slightly echogenic area in the pancreatic head. Could not exclude a lesion or adjacent lymph node. Recommend CT scan with pancreatic protocol. No dilatation of the pancreatic  duct.  Spleen: Normal size and echogenicity without focal lesions.  Right Kidney: Length: 10.9 cm. Normal renal cortical thickness and echogenicity without focal lesions or hydronephrosis.  Left Kidney: Length: 12.1 cm. Heterogeneous lower pole renal lesion measuring 3.6 x 3.2 x 4.1 cm. No renal calculi or hydronephrosis.  Abdominal aorta: Normal CT  Other findings: No ascites  IMPRESSION: 1. Normal gallbladder and normal caliber common bile duct. 2. Rounded slightly echogenic lesion in the pancreatic head measuring a maximum of 2.4 cm. No pancreatic or peripancreatic fluid collections. No pancreatic ductal dilatation. 3. 4 cm solid-appearing lower pole left renal lesion. 4. Recommend triple phase CT abdomen to better evaluate the pancreatic head and left lower pole renal mass.   Electronically Signed   By: Marijo Sanes M.D.   On: 04/03/2014 08:20    Scheduled Meds: . aspirin  81 mg Oral Daily  . buPROPion  150 mg Oral BID  . busPIRone  15 mg Oral BID  . ezetimibe-simvastatin  1 tablet Oral Daily  . heparin  5,000 Units Subcutaneous 3 times per day  . irbesartan  300 mg Oral Daily  . meloxicam  15 mg Oral Daily  . pramipexole  0.5 mg Oral QHS  . sertraline  150 mg Oral BID   Continuous Infusions: . sodium chloride 125 mL/hr (04/03/14 1248)    Principal Problem:   Acute pancreatitis Active Problems:   Essential (primary) hypertension    Time spent: 53min    WOODS, [place], J  [facility] Pager [phone]. If 7PM-7AM, please contact night-coverage at [email or web address], password [facility] 04/03/2014, 5:16 PM     Care during the described time interval was provided by me .  I have reviewed this patient's available data, including medical history, events of note, physical examination, radiology studies and test results as part of my evaluation  Dia Crawford, MD [phone] Pager

## 2014-04-03 NOTE — ED Notes (Signed)
Pt informed urine needed for UA, 30 minutes for pt to try if not able, will in/out cath. Pt verbalize understanding.

## 2014-04-03 NOTE — H&P (Signed)
[facility] History and Physical  Krista Hodges WNI:[id number] DOB: 1955-02-14 DOA: 04/03/2014  Referring physician: EDP PCP: Henrine Screws, MD   Chief Complaint: Epigastric pain   HPI: Krista Hodges is a 59 y.o. female who presents to the ED with c/o abdominal pain.  Pain is located in her epigastric area, no radiation.  Pain is aching and severe.  Onset 2 days ago, gradually worsening until today.  Better this evening on its own somewhat.  Saw [facility] yesterday (follows with them for colonoscopy), they got a lipase and it was over 1600 in the office.  They sent patient to ED for bowel rest and hydration.  Review of Systems: Systems reviewed.  As above, otherwise negative  Past Medical History  Diagnosis Date  . Atypical migraine   . HLD (hyperlipidemia)   . Premature complex, ventricular   . 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 Essential (primary) hypertension   . Allergic rhinitis   . Anxiety state   . Enthesopathy of hip   . Cervical radiculitis   . Depression, neurotic   . CFIDS (chronic fatigue and immune dysfunction syndrome)   . Inflammation of hand joint   . Excessive sweating, local   . Depression, major, single episode, in partial remission   . Amnesia   . Adiposity   . Arthritis, degenerative   . Awareness of heartbeats   . Restless leg   . Adaptation reaction   . Avitaminosis D    Past Surgical History  Procedure Laterality Date  . C sections x 3     . Mandible surgery     Social History:  reports that she has quit smoking. She does not have any smokeless tobacco history on file. She reports that she drinks alcohol. She reports that she does not use illicit drugs.  Allergies  Allergen Reactions  . Penicillins     whelts   . Requip [Ropinirole Hcl]     headaches    Family History  Problem Relation Age of Onset  . Cancer Mother   . Heart attack Father   . Diabetes Other      Prior to Admission medications    Medication Sig Start Date End Date Taking? Authorizing Provider  ALPRAZolam (XANAX) 0.25 MG tablet Take 0.25 mg by mouth 2 (two) times daily as needed for anxiety.   Yes Historical Provider, MD  aspirin 81 MG tablet Take 81 mg by mouth daily.   Yes Historical Provider, MD  buPROPion (WELLBUTRIN XL) 150 MG 24 hr tablet Take 150 mg by mouth 2 (two) times daily.   Yes Historical Provider, MD  busPIRone (BUSPAR) 15 MG tablet Take 15 mg by mouth 2 (two) times daily.   Yes Historical Provider, MD  Cholecalciferol (VITAMIN D3) 10000 UNITS capsule Take 30,000 Units by mouth 3 (three) times a week. Mon, Wed, Fri   Yes Historical Provider, MD  ezetimibe-simvastatin (VYTORIN) 10-40 MG per tablet Take 1 tablet by mouth daily.   Yes Historical Provider, MD  hydrochlorothiazide (HYDRODIURIL) 25 MG tablet Take 25 mg by mouth daily.   Yes Historical Provider, MD  ibuprofen (ADVIL,MOTRIN) 200 MG tablet Take 400 mg by mouth every 6 (six) hours as needed for moderate pain.   Yes Historical Provider, MD  irbesartan (AVAPRO) 300 MG tablet Take 300 mg by mouth daily.   Yes Historical Provider, MD  isometheptene-acetaminophen-dichloralphenazone (MIDRIN) 65-325-100 MG capsule Take 1 capsule by mouth every 4 (four) hours as needed for  migraine. Maximum 5 capsules in 12 hours for migraine headaches, 8 capsules in 24 hours for tension headaches.   Yes Historical Provider, MD  meloxicam (MOBIC) 15 MG tablet Take 15 mg by mouth daily.   Yes Historical Provider, MD  Multiple Vitamins-Minerals (HAIR/SKIN/NAILS PO) Take by mouth daily.   Yes Historical Provider, MD  pramipexole (MIRAPEX) 0.5 MG tablet Take 0.5 mg by mouth at bedtime.   Yes Historical Provider, MD  propranolol (INDERAL) 10 MG tablet Take 1 tablet (10 mg total) by mouth every 6 (six) hours as needed (for palpitations). 10/23/13  Yes Josue Hector, MD  sertraline (ZOLOFT) 100 MG tablet Take 150 mg by mouth 2 (two) times daily.    Yes Historical Provider, MD   tiZANidine (ZANAFLEX) 4 MG tablet Take 4-8 mg by mouth every 6 (six) hours as needed for muscle spasms.   Yes Historical Provider, MD  traMADol (ULTRAM) 50 MG tablet Take 50 mg by mouth every 6 (six) hours as needed (Neck Pain).    Yes Historical Provider, MD   Physical Exam: Filed Vitals:   04/03/14 0300  BP: 133/51  Pulse: 78  Temp:   Resp:     BP 133/51 mmHg  Pulse 78  Temp(Src) 97.9 F (36.6 C) (Oral)  Resp 18  SpO2 96%  General Appearance:    Alert, oriented, no distress, appears stated age  Head:    Normocephalic, atraumatic  Eyes:    PERRL, EOMI, sclera non-icteric        Nose:   Nares without drainage or epistaxis. Mucosa, turbinates normal  Throat:   Moist mucous membranes. Oropharynx without erythema or exudate.  Neck:   Supple. No carotid bruits.  No thyromegaly.  No lymphadenopathy.   Back:     No CVA tenderness, no spinal tenderness  Lungs:     Clear to auscultation bilaterally, without wheezes, rhonchi or rales  Chest wall:    No tenderness to palpitation  Heart:    Regular rate and rhythm without murmurs, gallops, rubs  Abdomen:     Soft, non-tender, nondistended, normal bowel sounds, no organomegaly  Genitalia:    deferred  Rectal:    deferred  Extremities:   No clubbing, cyanosis or edema.  Pulses:   2+ and symmetric all extremities  Skin:   Skin color, texture, turgor normal, no rashes or lesions  Lymph nodes:   Cervical, supraclavicular, and axillary nodes normal  Neurologic:   CNII-XII intact. Normal strength, sensation and reflexes      throughout    Labs on Admission:  Basic Metabolic Panel:  Recent Labs Lab 04/02/14 1914  NA 137  K 3.8  CL 102  CO2 27  GLUCOSE 95  BUN 19  CREATININE 1.02  CALCIUM 10.0   Liver Function Tests:  Recent Labs Lab 04/02/14 1914  AST 24  ALT 30  ALKPHOS 73  BILITOT 0.5  PROT 7.1  ALBUMIN 4.3    Recent Labs Lab 04/02/14 1914  LIPASE 671*   No results for input(s): AMMONIA in the last 168  hours. CBC:  Recent Labs Lab 04/02/14 1914  WBC 12.4*  NEUTROABS 9.8*  HGB 15.9*  HCT 47.1*  MCV 93.3  PLT 219   Cardiac Enzymes: No results for input(s): CKTOTAL, CKMB, CKMBINDEX, TROPONINI in the last 168 hours.  BNP (last 3 results) No results for input(s): PROBNP in the last 8760 hours. CBG: No results for input(s): GLUCAP in the last 168 hours.  Radiological Exams on Admission: No  results found.  EKG: Independently reviewed.  Assessment/Plan Principal Problem:   Acute pancreatitis Active Problems:   Essential (primary) hypertension   1. Acute pancreatitis - unclear cause, patient does drink occassionally. 1. Check US abdomen 2. Pain control with morphine PRN 3. NPO except meds and ice chips 4. IVF 2. HTN - continue home meds except HCTZ    Code Status: Full  Family Communication: No family in room Disposition Plan: Admit to obs   Time spent: 50 min  GARDNER, JARED M. [facility] Pager [phone]  If 7AM-7PM, please contact the day team taking care of the patient [email or web address] Password [facility] 04/03/2014, 3:37 AM

## 2014-04-03 NOTE — ED Notes (Signed)
Admitting md at bedside

## 2014-04-03 NOTE — Progress Notes (Signed)
ANTIBIOTIC CONSULT NOTE - INITIAL  Pharmacy Consult for ceftriaxone Indication: UTI  Allergies  Allergen Reactions  . Penicillins     whelts   . Requip [Ropinirole Hcl]     headaches    Patient Measurements:   Adjusted Body Weight:   Vital Signs: Temp: 97.8 F (36.6 C) (02/20 1444) Temp Source: Oral (02/20 1444) BP: 117/56 mmHg (02/20 1444) Intake/Output from previous day:   Intake/Output from this shift:    Labs:  Recent Labs  04/02/14 1914  WBC 12.4*  HGB 15.9*  PLT 219  CREATININE 1.02   CrCl cannot be calculated (Unknown ideal weight.). No results for input(s): VANCOTROUGH, VANCOPEAK, VANCORANDOM, GENTTROUGH, GENTPEAK, GENTRANDOM, TOBRATROUGH, TOBRAPEAK, TOBRARND, AMIKACINPEAK, AMIKACINTROU, AMIKACIN in the last 72 hours.   Microbiology: No results found for this or any previous visit (from the past 720 hour(s)).  Medical History: Past Medical History  Diagnosis Date  . Atypical migraine   . HLD (hyperlipidemia)   . Premature complex, ventricular   . 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 Essential (primary) hypertension   . Allergic rhinitis   . Anxiety state   . Enthesopathy of hip   . Cervical radiculitis   . Depression, neurotic   . CFIDS (chronic fatigue and immune dysfunction syndrome)   . Inflammation of hand joint   . Excessive sweating, local   . Depression, major, single episode, in partial remission   . Amnesia   . Adiposity   . Arthritis, degenerative   . Awareness of heartbeats   . Restless leg   . Adaptation reaction   . Avitaminosis D     Medications:  Scheduled:  . aspirin  81 mg Oral Daily  . buPROPion  150 mg Oral BID  . busPIRone  15 mg Oral BID  . ezetimibe-simvastatin  1 tablet Oral Daily  . heparin  5,000 Units Subcutaneous 3 times per day  . irbesartan  300 mg Oral Daily  . meloxicam  15 mg Oral Daily  . pramipexole  0.5 mg Oral QHS  . sertraline  150 mg Oral BID   Infusions:  . sodium  chloride 125 mL/hr (04/03/14 1248)   Assessment: 59 yo female with UTI will be put on ceftriaxone.    Goal of Therapy:  Resolution of infection  Plan:  - ceftriaxone 1g iv q24h. Pharmacy will sign off  [place], Tsz-Yin 04/03/2014,5:34 PM

## 2014-04-04 ENCOUNTER — Inpatient Hospital Stay (HOSPITAL_COMMUNITY): Payer: BLUE CROSS/BLUE SHIELD

## 2014-04-04 ENCOUNTER — Encounter (HOSPITAL_COMMUNITY): Payer: Self-pay | Admitting: Radiology

## 2014-04-04 DIAGNOSIS — K858 Other acute pancreatitis without necrosis or infection: Secondary | ICD-10-CM | POA: Diagnosis present

## 2014-04-04 DIAGNOSIS — K8689 Other specified diseases of pancreas: Secondary | ICD-10-CM | POA: Diagnosis present

## 2014-04-04 DIAGNOSIS — K869 Disease of pancreas, unspecified: Secondary | ICD-10-CM

## 2014-04-04 DIAGNOSIS — N390 Urinary tract infection, site not specified: Secondary | ICD-10-CM | POA: Diagnosis present

## 2014-04-04 DIAGNOSIS — K8500 Idiopathic acute pancreatitis without necrosis or infection: Secondary | ICD-10-CM | POA: Diagnosis present

## 2014-04-04 DIAGNOSIS — N2889 Other specified disorders of kidney and ureter: Secondary | ICD-10-CM | POA: Diagnosis present

## 2014-04-04 LAB — RAPID URINE DRUG SCREEN, HOSP PERFORMED
AMPHETAMINES: NOT DETECTED
Barbiturates: NOT DETECTED
Benzodiazepines: POSITIVE — AB
Cocaine: NOT DETECTED
Opiates: NOT DETECTED
Tetrahydrocannabinol: NOT DETECTED

## 2014-04-04 LAB — COMPREHENSIVE METABOLIC PANEL
ALT: 10 U/L (ref 0–35)
ALT: 22 U/L (ref 0–35)
AST: 16 U/L (ref 0–37)
AST: 23 U/L (ref 0–37)
Albumin: 2.5 g/dL — ABNORMAL LOW (ref 3.5–5.2)
Albumin: 3.8 g/dL (ref 3.5–5.2)
Alkaline Phosphatase: 92 U/L (ref 39–117)
Alkaline Phosphatase: 64 U/L (ref 39–117)
Anion gap: 5 (ref 5–15)
Anion gap: 5 (ref 5–15)
BILIRUBIN TOTAL: 0.5 mg/dL (ref 0.3–1.2)
BUN: 13 mg/dL (ref 6–23)
BUN: 11 mg/dL (ref 6–23)
CHLORIDE: 100 mmol/L (ref 96–112)
CO2: 32 mmol/L (ref 19–32)
CO2: 25 mmol/L (ref 19–32)
Calcium: 8.5 mg/dL (ref 8.4–10.5)
Calcium: 9.7 mg/dL (ref 8.4–10.5)
Chloride: 111 mmol/L (ref 96–112)
Creatinine, Ser: 1.42 mg/dL — ABNORMAL HIGH (ref 0.50–1.10)
Creatinine, Ser: 0.8 mg/dL (ref 0.50–1.10)
GFR calc Af Amer: 45 mL/min — ABNORMAL LOW (ref >90)
GFR calc Af Amer: >90 mL/min (ref >90)
GFR calc non Af Amer: 79 mL/min — ABNORMAL LOW (ref >90)
GFR, EST NON AFRICAN AMERICAN: 39 mL/min — AB (ref >90)
Glucose, Bld: 215 mg/dL — ABNORMAL HIGH (ref 70–99)
Glucose, Bld: 121 mg/dL — ABNORMAL HIGH (ref 70–99)
POTASSIUM: 3.7 mmol/L (ref 3.5–5.1)
Potassium: 3.4 mmol/L — ABNORMAL LOW (ref 3.5–5.1)
SODIUM: 137 mmol/L (ref 135–145)
Sodium: 141 mmol/L (ref 135–145)
TOTAL PROTEIN: 6.7 g/dL (ref 6.0–8.3)
Total Bilirubin: 0.6 mg/dL (ref 0.3–1.2)
Total Protein: 6.3 g/dL (ref 6.0–8.3)

## 2014-04-04 LAB — CBC WITH DIFFERENTIAL/PLATELET
BASOS ABS: 0 10*3/uL (ref 0.0–0.1)
BASOS PCT: 1 % (ref 0–1)
Eosinophils Absolute: 0.1 10*3/uL (ref 0.0–0.7)
Eosinophils Relative: 2 % (ref 0–5)
HCT: 38.3 % (ref 36.0–46.0)
Hemoglobin: 12.5 g/dL (ref 12.0–15.0)
LYMPHS PCT: 18 % (ref 12–46)
Lymphs Abs: 1.2 10*3/uL (ref 0.7–4.0)
MCH: 30.3 pg (ref 26.0–34.0)
MCHC: 32.6 g/dL (ref 30.0–36.0)
MCV: 93 fL (ref 78.0–100.0)
MONO ABS: 0.3 10*3/uL (ref 0.1–1.0)
Monocytes Relative: 5 % (ref 3–12)
NEUTROS ABS: 4.9 10*3/uL (ref 1.7–7.7)
Neutrophils Relative %: 74 % (ref 43–77)
Platelets: 225 10*3/uL (ref 150–400)
RBC: 4.12 MIL/uL (ref 3.87–5.11)
RDW: 12.3 % (ref 11.5–15.5)
WBC: 6.5 10*3/uL (ref 4.0–10.5)

## 2014-04-04 LAB — LIPASE, BLOOD
LIPASE: 33 U/L (ref 11–59)
Lipase: 22 U/L (ref 11–59)

## 2014-04-04 MED ORDER — IOHEXOL 300 MG/ML  SOLN
100.0000 mL | Freq: Once | INTRAMUSCULAR | Status: AC | PRN
  Administered 2014-04-04: 100 mL via INTRAVENOUS

## 2014-04-04 MED ORDER — CIPROFLOXACIN HCL 500 MG PO TABS
500.0000 mg | ORAL_TABLET | Freq: Two times a day (BID) | ORAL | Status: DC
Start: 2014-04-04 — End: 2014-04-05
  Administered 2014-04-04 – 2014-04-05 (×2): 500 mg via ORAL
  Filled 2014-04-04 (×4): qty 1

## 2014-04-04 NOTE — Progress Notes (Signed)
[facility] PROGRESS NOTE  Krista Hodges XLK:[id number] DOB: 10/10/1955 DOA: 04/03/2014 PCP: Henrine Screws, MD  Assessment/Plan: Acute pancreatitis - unclear cause, patient does drink occassionally. -Abdominal ultrasound showed possible pancreatic nodule, left kidney lesion.  -Lipid panel; cholesterol= 134, TG 137, HDL 41, LDL 66 -Lipase within normal limit -Advance diet to regular  Pancreatic lesion? -Obtain triple phase CT abdomen to evaluate the pancreatic head and left lower pole renal mass.  Left Kidney mass: - Heterogeneous lower pole renal lesion measuring 3.6 x 3.2 x 4.1 cm. -Obtain triple phase CT abdomen to evaluate the pancreatic head and left lower pole renal mass  HTN  - continue home meds except HCTZ  UTI -DC ceftriaxone -Ciprofloxacin 500 mg BID    Code Status: Full Family Communication: None  Disposition Plan: Resolution acute pancreatitis   Consultants: NA  Procedures: NA  Cultures 2/20 urine appears was not sent.   Antibiotics: Ceftriaxone 2/20>> stopped 2/21 Ciprofloxacin 2/21>>    DVT prophylaxis Subcutaneous heparin   HPI/Subjective: Krista Hodges is a 59 y.o. WF PMHx depression, anxiety, atypical migraine, chronic fatigue immune dysfunction syndrome, RLS, HLD, PVC, Essential HTN, thrombophlebitis superficial veins lower extremity female who presents to the ED with c/o abdominal pain. Pain is located in her epigastric area, no radiation. Pain is aching and severe. Onset 2 days ago, gradually worsening until today. Better this evening on its own somewhat. Saw [facility] yesterday (follows with them for colonoscopy), they got a lipase and it was over 1600 in the office. They sent patient to ED for bowel rest and hydration. 2/20 A/O 4, NAD, negative N/V. Lipase decreased to 53 U/L 2/21 A/O 4, NAD, negative N/V postprandial, owing to difficulty of schedule outpatient CT scan on weekend patient prefers to obtain scan  prior to discharge.    Objective: Filed Vitals:   04/04/14 0100 04/04/14 0512 04/04/14 1427 04/04/14 1445  BP:  117/57 102/40 122/62  Pulse:  76    Temp:  98.7 F (37.1 C) 97.7 F (36.5 C)   TempSrc:  Oral Oral   Resp:  18 20   Height: 5\' 5"  (1.651 m)     Weight: 91.627 kg (202 lb)     SpO2:  97% 96%     Intake/Output Summary (Last 24 hours) at 04/04/14 1551 Last data filed at 04/03/14 2132  Gross per 24 hour  Intake    120 ml  Output      0 ml  Net    120 ml   Filed Weights   04/04/14 0100  Weight: 91.627 kg (202 lb)     Exam: General: A/O 4, NAD, No acute respiratory distress Lungs: Clear to auscultation bilaterally without wheezes or crackles Cardiovascular: Regular rate and rhythm without murmur gallop or rub normal S1 and S2 Abdomen: Nontender, nondistended, soft, bowel sounds positive, no rebound, no ascites, no appreciable mass Extremities: No significant cyanosis, clubbing, or edema bilateral lower extremities   Data Reviewed: Basic Metabolic Panel:  Recent Labs Lab 04/02/14 1914 04/03/14 2036 04/04/14 0600 04/04/14 0830  NA 137 142 137 141  K 3.8 3.9 3.7 3.4*  CL 102 110 100 111  CO2 27 25 32 25  GLUCOSE 95 89 215* 121*  BUN 19 11 13 11   CREATININE 1.02 0.88 1.42* 0.80  CALCIUM 10.0 9.4 8.5 9.7   Liver Function Tests:  Recent Labs Lab 04/02/14 1914 04/03/14 2036 04/04/14 0600 04/04/14 0830  AST 24 17 16 23   ALT 30 20 10  22  ALKPHOS 73 60 92 64  BILITOT 0.5 0.6 0.5 0.6  PROT 7.1 5.7* 6.7 6.3  ALBUMIN 4.3 3.4* 2.5* 3.8    Recent Labs Lab 04/02/14 1914 04/03/14 2036 04/04/14 0600 04/04/14 0830  LIPASE 671* 53 22 33   No results for input(s): AMMONIA in the last 168 hours. CBC:  Recent Labs Lab 04/02/14 1914 04/03/14 2036 04/04/14 0830  WBC 12.4* 6.5 6.5  NEUTROABS 9.8* 4.4 4.9  HGB 15.9* 12.1 12.5  HCT 47.1* 37.2 38.3  MCV 93.3 95.4 93.0  PLT 219 201 225   Cardiac Enzymes: No results for input(s): CKTOTAL, CKMB,  CKMBINDEX, TROPONINI in the last 168 hours. BNP (last 3 results) No results for input(s): BNP in the last 8760 hours.  ProBNP (last 3 results) No results for input(s): PROBNP in the last 8760 hours.  CBG: No results for input(s): GLUCAP in the last 168 hours.  No results found for this or any previous visit (from the past 240 hour(s)).   Studies: US Abdomen Complete  04/03/2014   CLINICAL DATA:  Acute pancreatitis  EXAM: ULTRASOUND ABDOMEN COMPLETE  COMPARISON:  None.  FINDINGS: Gallbladder: No gallstones or wall thickening visualized. No sonographic Murphy sign noted.  Common bile duct: Diameter: 4.0 mm  Liver: Heterogeneous liver echogenicity likely due to geographic fatty infiltration. No focal hepatic lesions or intrahepatic biliary dilatation.  IVC: Normal caliber  Pancreas: Heterogeneous echogenicity. No focal fluid collections. There is a rounded slightly echogenic area in the pancreatic head. Could not exclude a lesion or adjacent lymph node. Recommend CT scan with pancreatic protocol. No dilatation of the pancreatic duct.  Spleen: Normal size and echogenicity without focal lesions.  Right Kidney: Length: 10.9 cm. Normal renal cortical thickness and echogenicity without focal lesions or hydronephrosis.  Left Kidney: Length: 12.1 cm. Heterogeneous lower pole renal lesion measuring 3.6 x 3.2 x 4.1 cm. No renal calculi or hydronephrosis.  Abdominal aorta: Normal CT  Other findings: No ascites  IMPRESSION: 1. Normal gallbladder and normal caliber common bile duct. 2. Rounded slightly echogenic lesion in the pancreatic head measuring a maximum of 2.4 cm. No pancreatic or peripancreatic fluid collections. No pancreatic ductal dilatation. 3. 4 cm solid-appearing lower pole left renal lesion. 4. Recommend triple phase CT abdomen to better evaluate the pancreatic head and left lower pole renal mass.   Electronically Signed   By: Marijo Sanes M.D.   On: 04/03/2014 08:20    Scheduled Meds: . aspirin   81 mg Oral Daily  . buPROPion  150 mg Oral BID  . busPIRone  15 mg Oral BID  . ciprofloxacin  500 mg Oral BID  . ezetimibe-simvastatin  1 tablet Oral Daily  . heparin  5,000 Units Subcutaneous 3 times per day  . irbesartan  300 mg Oral Daily  . meloxicam  15 mg Oral Daily  . pramipexole  0.5 mg Oral QHS  . sertraline  150 mg Oral BID   Continuous Infusions: . sodium chloride 125 mL/hr at 04/04/14 1419    Principal Problem:   Acute pancreatitis Active Problems:   Essential (primary) hypertension   Idiopathic acute pancreatitis   Urinary tract infectious disease    Time spent: 74min    WOODS, [place], J  [facility] Pager [phone]. If 7PM-7AM, please contact night-coverage at [email or web address], password [facility] 04/04/2014, 3:51 PM  LOS: 1 day    Care during the described time interval was provided by me .  I have reviewed this patient's available data, including medical  history, events of note, physical examination, radiology studies and test results as part of my evaluation  Dia Crawford, MD [phone] Pager

## 2014-04-05 DIAGNOSIS — K859 Acute pancreatitis, unspecified: Secondary | ICD-10-CM

## 2014-04-05 LAB — CBC WITH DIFFERENTIAL/PLATELET
BASOS ABS: 0 10*3/uL (ref 0.0–0.1)
Basophils Relative: 1 % (ref 0–1)
EOS ABS: 0.1 10*3/uL (ref 0.0–0.7)
Eosinophils Relative: 3 % (ref 0–5)
HCT: 35.9 % — ABNORMAL LOW (ref 36.0–46.0)
Hemoglobin: 11.9 g/dL — ABNORMAL LOW (ref 12.0–15.0)
Lymphocytes Relative: 22 % (ref 12–46)
Lymphs Abs: 1 10*3/uL (ref 0.7–4.0)
MCH: 30.8 pg (ref 26.0–34.0)
MCHC: 33.1 g/dL (ref 30.0–36.0)
MCV: 93 fL (ref 78.0–100.0)
MONOS PCT: 8 % (ref 3–12)
Monocytes Absolute: 0.4 10*3/uL (ref 0.1–1.0)
NEUTROS ABS: 3.2 10*3/uL (ref 1.7–7.7)
NEUTROS PCT: 66 % (ref 43–77)
Platelets: 212 10*3/uL (ref 150–400)
RBC: 3.86 MIL/uL — ABNORMAL LOW (ref 3.87–5.11)
RDW: 12.5 % (ref 11.5–15.5)
WBC: 4.8 10*3/uL (ref 4.0–10.5)

## 2014-04-05 LAB — COMPREHENSIVE METABOLIC PANEL WITH GFR
ALT: 26 U/L (ref 0–35)
AST: 25 U/L (ref 0–37)
Albumin: 3.4 g/dL — ABNORMAL LOW (ref 3.5–5.2)
Alkaline Phosphatase: 62 U/L (ref 39–117)
Anion gap: 3 — ABNORMAL LOW (ref 5–15)
BUN: 6 mg/dL (ref 6–23)
CO2: 24 mmol/L (ref 19–32)
Calcium: 9.2 mg/dL (ref 8.4–10.5)
Chloride: 113 mmol/L — ABNORMAL HIGH (ref 96–112)
Creatinine, Ser: 0.81 mg/dL (ref 0.50–1.10)
GFR calc Af Amer: 90 mL/min — ABNORMAL LOW
GFR calc non Af Amer: 77 mL/min — ABNORMAL LOW
Glucose, Bld: 101 mg/dL — ABNORMAL HIGH (ref 70–99)
Potassium: 3.8 mmol/L (ref 3.5–5.1)
Sodium: 140 mmol/L (ref 135–145)
Total Bilirubin: 0.5 mg/dL (ref 0.3–1.2)
Total Protein: 5.9 g/dL — ABNORMAL LOW (ref 6.0–8.3)

## 2014-04-05 LAB — LIPASE, BLOOD: Lipase: 26 U/L (ref 11–59)

## 2014-04-05 MED ORDER — CIPROFLOXACIN HCL 500 MG PO TABS: 500.0000 mg | ORAL_TABLET | Freq: Two times a day (BID) | ORAL | Status: DC

## 2014-04-05 NOTE — Discharge Instructions (Signed)
Follow with Primary MD GATES,ROBERT NEVILL, MD in 7 days  Please follow with your scheduled appointment with urology on 2/23 at 1 PM Get CBC, CMP, 2 view Chest X ray checked  by Primary MD next visit.    Activity: As tolerated with Full fall precautions use walker/cane & assistance as needed   Disposition Home    Diet: Heart Healthy , low-fat , with feeding assistance and aspiration precautions as needed.  For Heart failure patients - Check your Weight same time everyday, if you gain over 2 pounds, or you develop in leg swelling, experience more shortness of breath or chest pain, call your Primary MD immediately. Follow Cardiac Low Salt Diet and 1.5 lit/day fluid restriction.   On your next visit with your primary care physician please Get Medicines reviewed and adjusted.   Please request your Prim.MD to go over all Hospital Tests and Procedure/Radiological results at the follow up, please get all Hospital records sent to your Prim MD by signing hospital release before you go home.   If you experience worsening of your admission symptoms, develop shortness of breath, life threatening emergency, suicidal or homicidal thoughts you must seek medical attention immediately by calling 911 or calling your MD immediately  if symptoms less severe.  You Must read complete instructions/literature along with all the possible adverse reactions/side effects for all the Medicines you take and that have been prescribed to you. Take any new Medicines after you have completely understood and accpet all the possible adverse reactions/side effects.   Do not drive, operating heavy machinery, perform activities at heights, swimming or participation in water activities or provide baby sitting services if your were admitted for syncope or siezures until you have seen by Primary MD or a Neurologist and advised to do so again.  Do not drive when taking Pain medications.    Do not take more than prescribed Pain,  Sleep and Anxiety Medications  Special Instructions: If you have smoked or chewed Tobacco  in the last 2 yrs please stop smoking, stop any regular Alcohol  and or any Recreational drug use.  Wear Seat belts while driving.   Please note  You were cared for by a hospitalist during your hospital stay. If you have any questions about your discharge medications or the care you received while you were in the hospital after you are discharged, you can call the unit and asked to speak with the hospitalist on call if the hospitalist that took care of you is not available. Once you are discharged, your primary care physician will handle any further medical issues. Please note that NO REFILLS for any discharge medications will be authorized once you are discharged, as it is imperative that you return to your primary care physician (or establish a relationship with a primary care physician if you do not have one) for your aftercare needs so that they can reassess your need for medications and monitor your lab values.

## 2014-04-05 NOTE — Progress Notes (Signed)
Pt. Received discharge instructions and prescriptions. Educated pt. On follow-up appointments. Removed IV. No skin issues noted. All questions answered. No further needs noted at this time.

## 2014-04-05 NOTE — Discharge Summary (Signed)
Krista Hodges, is a 59 y.o. female  DOB 11/11/1955  MRN [id number].  Admission date:  04/03/2014  Admitting Physician  Etta Quill, DO  Discharge Date:  04/05/2014   Primary MD  Henrine Screws, MD  Recommendations for primary care physician for things to follow:  - Check CBC, BMP during next visit - Patient will follow with urology regarding incidental finding of left renal mass suspicious for malignancy ( appointment is for 2/23 at 1 PM)  Admission Diagnosis  Acute pancreatitis [K85.9] Idiopathic acute pancreatitis [K85.0]   Discharge Diagnosis  Acute pancreatitis [K85.9] Idiopathic acute pancreatitis [K85.0]    Principal Problem:   Acute pancreatitis Active Problems:   Essential (primary) hypertension   Idiopathic acute pancreatitis   Urinary tract infectious disease   Pancreatic mass   Other acute pancreatitis   Left kidney mass      Past Medical History  Diagnosis Date  . Atypical migraine   . HLD (hyperlipidemia)   . Premature complex, ventricular   . 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 Essential (primary) hypertension   . Allergic rhinitis   . Anxiety state   . Enthesopathy of hip   . Cervical radiculitis   . Depression, neurotic   . CFIDS (chronic fatigue and immune dysfunction syndrome)   . Inflammation of hand joint   . Excessive sweating, local   . Depression, major, single episode, in partial remission   . Amnesia   . Adiposity   . Arthritis, degenerative   . Awareness of heartbeats   . Restless leg   . Adaptation reaction   . Avitaminosis D     Past Surgical History  Procedure Laterality Date  . C sections x 3     . Mandible surgery         History of present illness and  Hospital Course:     Kindly see H&P for history of present  illness and admission details, please review complete Labs, Consult reports and Test reports for all details in brief  HPI  from the history and physical done on the day of admission    Krista Hodges is a 59 y.o. WF PMHx depression, anxiety, atypical migraine, chronic fatigue immune dysfunction syndrome, RLS, HLD, PVC, Essential HTN, thrombophlebitis superficial veins lower extremity female who presents to the ED with c/o abdominal pain. Pain is located in her epigastric area, no radiation. Pain is aching and severe,  Saw [facility]  (follows with them for colonoscopy), they got a lipase and it was over 1600 in the office. They sent patient to ED for bowel rest and hydration, lipase was 671 on admission, normalized by second day, patient diet was advanced and tolerating regular diet now no nausea vomiting or abdominal pain. - LFTs were within normal limits, patient had right upper quadrant ultrasound, did not show any abnormal biliary or gallbladder findings, but did show suspicion for mass in the pancreatic head, and left kidney lower pole solid mass,  so triple phase CT abdomen and pelvis was obtained, no pancreatic mass could be identified, but there was solid enhancing mass in the lower pole of left kidney suspicious for malignancy.  Acute pancreatitis - unclear cause, patient does drink occasionally, on occasions once a month average. -Abdominal ultrasound showed possible pancreatic nodule, could not be identified on CT abdomen and pelvis triple phase -Lipid panel; cholesterol= 134, TG 137, HDL 41, LDL 66 -Lipase within normal limit -Tolerating regular diet  Left kidney mass - CT abdomen and pelvis showing mass suspicious for  Malignancy, patient has urology appointment scheduled on 04/06/14 at 1 PM with Dr.Herrick with [facility], patient and husband understand the importance of the follow-up and possibility of malignancy.   HTN  - resume  UTI -Treated with  Rocephin initially, transitioned to Cipro to finish total of 5 days of antibiotics .      [facility] over the phone  Procedures:    Antibiotics: Ceftriaxone 2/20>> stopped 2/21 Ciprofloxacin 2/21>>    DVT prophylaxis  Discharge Condition: Stable   Follow UP  Follow-up Information    Follow up with GATES,ROBERT NEVILL, MD. Schedule an appointment as soon as possible for a visit in 1 week.   Specialty:  Internal Medicine   Why:  Last hospitalization follow-up   Contact information:   [address] [place] [address] [phone]       Follow up with Ardis Hughs, MD.   Specialty:  Urology   Why:  Appointment on Tuesday 2/23, at 1 PM, please be at 12:30   Contact information:   [place] [address] [phone]         Discharge Instructions  and  Discharge Medications     Discharge Instructions    Diet - low sodium heart healthy    Complete by:  As directed      Discharge instructions    Complete by:  As directed   Follow with Primary MD GATES,ROBERT NEVILL, MD in 7 days  Please follow with your scheduled appointment with urology on 2/23 at 1 PM Get CBC, CMP, 2 view Chest X ray checked  by Primary MD next visit.    Activity: As tolerated with Full fall precautions use walker/cane & assistance as needed   Disposition Home    Diet: Heart Healthy , low-fat , with feeding assistance and aspiration precautions as needed.  For Heart failure patients - Check your Weight same time everyday, if you gain over 2 pounds, or you develop in leg swelling, experience more shortness of breath or chest pain, call your Primary MD immediately. Follow Cardiac Low Salt Diet and 1.5 lit/day fluid restriction.   On your next visit with your primary care physician please Get Medicines reviewed and adjusted.   Please request your Prim.MD to go over all Hospital Tests and Procedure/Radiological results at the follow up, please get  all Hospital records sent to your Prim MD by signing hospital release before you go home.   If you experience worsening of your admission symptoms, develop shortness of breath, life threatening emergency, suicidal or homicidal thoughts you must seek medical attention immediately by calling 911 or calling your MD immediately  if symptoms less severe.  You Must read complete instructions/literature along with all the possible adverse reactions/side effects for all the Medicines you take and that have been prescribed to you. Take any new Medicines after you have completely understood and accpet all the possible adverse reactions/side  effects.   Do not drive, operating heavy machinery, perform activities at heights, swimming or participation in water activities or provide baby sitting services if your were admitted for syncope or siezures until you have seen by Primary MD or a Neurologist and advised to do so again.  Do not drive when taking Pain medications.    Do not take more than prescribed Pain, Sleep and Anxiety Medications  Special Instructions: If you have smoked or chewed Tobacco  in the last 2 yrs please stop smoking, stop any regular Alcohol  and or any Recreational drug use.  Wear Seat belts while driving.   Please note  You were cared for by a hospitalist during your hospital stay. If you have any questions about your discharge medications or the care you received while you were in the hospital after you are discharged, you can call the unit and asked to speak with the hospitalist on call if the hospitalist that took care of you is not available. Once you are discharged, your primary care physician will handle any further medical issues. Please note that NO REFILLS for any discharge medications will be authorized once you are discharged, as it is imperative that you return to your primary care physician (or establish a relationship with a primary care physician if you do not have one)  for your aftercare needs so that they can reassess your need for medications and monitor your lab values.     Increase activity slowly    Complete by:  As directed             Medication List    STOP taking these medications        ibuprofen 200 MG tablet  Commonly known as:  ADVIL,MOTRIN     traMADol 50 MG tablet  Commonly known as:  ULTRAM      TAKE these medications        ALPRAZolam 0.25 MG tablet  Commonly known as:  XANAX  Take 0.25 mg by mouth 2 (two) times daily as needed for anxiety.     aspirin 81 MG tablet  Take 81 mg by mouth daily.     buPROPion 150 MG 24 hr tablet  Commonly known as:  WELLBUTRIN XL  Take 150 mg by mouth 2 (two) times daily.     busPIRone 15 MG tablet  Commonly known as:  BUSPAR  Take 15 mg by mouth 2 (two) times daily.     ciprofloxacin 500 MG tablet  Commonly known as:  CIPRO  Take 1 tablet (500 mg total) by mouth 2 (two) times daily.     ezetimibe-simvastatin 10-40 MG per tablet  Commonly known as:  VYTORIN  Take 1 tablet by mouth daily.     HAIR/SKIN/NAILS PO  Take by mouth daily.     hydrochlorothiazide 25 MG tablet  Commonly known as:  HYDRODIURIL  Take 25 mg by mouth daily.     irbesartan 300 MG tablet  Commonly known as:  AVAPRO  Take 300 mg by mouth daily.     isometheptene-acetaminophen-dichloralphenazone 65-325-100 MG capsule  Commonly known as:  MIDRIN  Take 1 capsule by mouth every 4 (four) hours as needed for migraine. Maximum 5 capsules in 12 hours for migraine headaches, 8 capsules in 24 hours for tension headaches.     meloxicam 15 MG tablet  Commonly known as:  MOBIC  Take 15 mg by mouth daily.     pramipexole 0.5 MG tablet  Commonly known as:  MIRAPEX  Take 0.5 mg by mouth at bedtime.     propranolol 10 MG tablet  Commonly known as:  INDERAL  Take 1 tablet (10 mg total) by mouth every 6 (six) hours as needed (for palpitations).     sertraline 100 MG tablet  Commonly known as:  ZOLOFT  Take 150 mg  by mouth 2 (two) times daily.     tiZANidine 4 MG tablet  Commonly known as:  ZANAFLEX  Take 4-8 mg by mouth every 6 (six) hours as needed for muscle spasms.     Vitamin D3 10000 UNITS capsule  Take 30,000 Units by mouth 3 (three) times a week. Mon, Wed, Fri          Diet and Activity recommendation: See Discharge Instructions above   Consults obtained -    Major procedures and Radiology Reports - PLEASE review detailed and final reports for all details, in brief -      US Abdomen Complete  04/03/2014   CLINICAL DATA:  Acute pancreatitis  EXAM: ULTRASOUND ABDOMEN COMPLETE  COMPARISON:  None.  FINDINGS: Gallbladder: No gallstones or wall thickening visualized. No sonographic Murphy sign noted.  Common bile duct: Diameter: 4.0 mm  Liver: Heterogeneous liver echogenicity likely due to geographic fatty infiltration. No focal hepatic lesions or intrahepatic biliary dilatation.  IVC: Normal caliber  Pancreas: Heterogeneous echogenicity. No focal fluid collections. There is a rounded slightly echogenic area in the pancreatic head. Could not exclude a lesion or adjacent lymph node. Recommend CT scan with pancreatic protocol. No dilatation of the pancreatic duct.  Spleen: Normal size and echogenicity without focal lesions.  Right Kidney: Length: 10.9 cm. Normal renal cortical thickness and echogenicity without focal lesions or hydronephrosis.  Left Kidney: Length: 12.1 cm. Heterogeneous lower pole renal lesion measuring 3.6 x 3.2 x 4.1 cm. No renal calculi or hydronephrosis.  Abdominal aorta: Normal CT  Other findings: No ascites  IMPRESSION: 1. Normal gallbladder and normal caliber common bile duct. 2. Rounded slightly echogenic lesion in the pancreatic head measuring a maximum of 2.4 cm. No pancreatic or peripancreatic fluid collections. No pancreatic ductal dilatation. 3. 4 cm solid-appearing lower pole left renal lesion. 4. Recommend triple phase CT abdomen to better evaluate the pancreatic head  and left lower pole renal mass.   Electronically Signed   By: Marijo Sanes M.D.   On: 04/03/2014 08:20   Ct Abd Wo & W Cm  04/04/2014   CLINICAL DATA:  Further evaluation of possible pancreatic mass can't left renal mass seen on recent ultrasound  EXAM: CT ABDOMEN WITHOUT AND WITH CONTRAST  TECHNIQUE: Multidetector CT imaging of the abdomen was performed following the standard protocol before and following the bolus administration of intravenous contrast.  CONTRAST:  167mL OMNIPAQUE IOHEXOL 300 MG/ML  SOLN  COMPARISON:  04/03/2014  FINDINGS: In the lower pole of the left kidney, there is a mass measuring 38 x 42 x 31 mm. It is round and well demarcated, arising in the medial cortex of the lower pole. It demonstrates early hyperattenuation, with the zones of low attenuation suggestive of a central scar. The renal vein is patent. There is no significant adenopathy in the abdomen or pelvis including in the perinephric region. The right kidney is normal.  The liver, gallbladder, and spleen are normal. There is no evidence of pancreatic mass or cyst. There are foci of fatty invagination into the pancreatic head likely accounting for the echogenic finding on ultrasound. The adrenal glands are normal.  Visualized portions of the lung bases are clear. There are no acute musculoskeletal findings. There are no suspicious skeletal lesions.  IMPRESSION: 1. A pancreatic mass is not identified. 2. Solid enhancing masses lower pole left kidney. Malignancy is suspected. This could represent a renal cell carcinoma. The possibility of an oncocytoma is also considered.   Electronically Signed   By: Skipper Cliche M.D.   On: 04/04/2014 18:18    Micro Results    No results found for this or any previous visit (from the past 240 hour(s)).     Today   Subjective:   Leandra Kern today has no headache,no chest abdominal pain,no new weakness tingling or numbness, feels much better wants to go home today.   Objective:    Blood pressure 131/70, pulse 74, temperature 98.6 F (37 C), temperature source Oral, resp. rate 18, height 5\' 5"  (1.651 m), weight 91.627 kg (202 lb), SpO2 97 %.   Intake/Output Summary (Last 24 hours) at 04/05/14 1328 Last data filed at 04/05/14 1227  Gross per 24 hour  Intake    720 ml  Output   1100 ml  Net   -380 ml    Exam Awake Alert, Oriented x 3, No new F.N deficits, Normal affect [place].AT,PERRAL Supple Neck,No JVD, No cervical lymphadenopathy appriciated.  Symmetrical Chest wall movement, Good air movement bilaterally, CTAB RRR,No Gallops,Rubs or new Murmurs, No Parasternal Heave +ve B.Sounds, Abd Soft, Non tender, No organomegaly appriciated, No rebound -guarding or rigidity. No Cyanosis, Clubbing or edema, No new Rash or bruise  Data Review   CBC w Diff: Lab Results  Component Value Date   WBC 4.8 04/05/2014   HGB 11.9* 04/05/2014   HCT 35.9* 04/05/2014   PLT 212 04/05/2014   LYMPHOPCT 22 04/05/2014   MONOPCT 8 04/05/2014   EOSPCT 3 04/05/2014   BASOPCT 1 04/05/2014    CMP: Lab Results  Component Value Date   NA 140 04/05/2014   K 3.8 04/05/2014   CL 113* 04/05/2014   CO2 24 04/05/2014   BUN 6 04/05/2014   CREATININE 0.81 04/05/2014   PROT 5.9* 04/05/2014   ALBUMIN 3.4* 04/05/2014   BILITOT 0.5 04/05/2014   ALKPHOS 62 04/05/2014   AST 25 04/05/2014   ALT 26 04/05/2014  .   Total Time in preparing paper work, data evaluation and todays exam - 35 minutes  Garek Schuneman M.D on 04/05/2014 at 1:28 PM  [facility] Office  [phone]

## 2014-04-09 ENCOUNTER — Other Ambulatory Visit: Payer: Self-pay | Admitting: Urology

## 2014-04-12 ENCOUNTER — Other Ambulatory Visit: Payer: Self-pay | Admitting: Urology

## 2014-04-13 ENCOUNTER — Other Ambulatory Visit: Payer: Self-pay | Admitting: Urology

## 2014-05-07 ENCOUNTER — Ambulatory Visit
Admission: RE | Admit: 2014-05-07 | Discharge: 2014-05-07 | Disposition: A | Discharge status: Home / Self Care | Payer: BLUE CROSS/BLUE SHIELD | Source: Ambulatory Visit | Attending: Urology | Admitting: Urology

## 2014-05-07 ENCOUNTER — Other Ambulatory Visit: Payer: Self-pay | Admitting: Urology

## 2014-05-07 DIAGNOSIS — N2889 Other specified disorders of kidney and ureter: Secondary | ICD-10-CM

## 2014-05-13 NOTE — Patient Instructions (Addendum)
Krista Hodges  05/13/2014   Your procedure is scheduled on: 05/21/14   Report to [facility]  Entrance and follow signs to               [place] at 7:00 AM.   Call this number if you have problems the morning of surgery [phone]   Remember:  Do not eat food or drink liquids :After Midnight.    Take these medicines the morning of surgery with A SIP OF WATER: WELLBUTRIN / BUSPAR / ZOLOFT                                You may not have any metal on your body including hair pins and              piercings  Do not wear jewelry, make-up, lotions, powders or perfumes.             Do not wear nail polish.  Do not shave  48 hours prior to surgery.              Men may shave face and neck.   Do not bring valuables to the hospital. [place].  Contacts, dentures or bridgework may not be worn into surgery.  Leave suitcase in the car. After surgery it may be brought to your room.     Patients discharged the day of surgery will not be allowed to drive home.  Name and phone number of your driver:  Special Instructions: N/A              Please read over the following fact sheets you were given: _____________________________________________________________________                                                     [place]  Before surgery, you can play an important role.  Because skin is not sterile, your skin needs to be as free of germs as possible.  You can reduce the number of germs on your skin by washing with CHG (chlorahexidine gluconate) soap before surgery.  CHG is an antiseptic cleaner which kills germs and bonds with the skin to continue killing germs even after washing. Please DO NOT use if you have an allergy to CHG or antibacterial soaps.  If your skin becomes reddened/irritated stop using the CHG and inform your nurse when you arrive at Short Stay. Do not shave  (including legs and underarms) for at least 48 hours prior to the first CHG shower.  You may shave your face. Please follow these instructions carefully:   1.  Shower with CHG Soap the night before surgery and the  morning of Surgery.   2.  If you choose to wash your hair, wash your hair first as usual with your  normal  Shampoo.   3.  After you shampoo, rinse your hair and body thoroughly to remove the  shampoo.  4.  Use CHG as you would any other liquid soap.  You can apply chg directly  to the skin and wash . Gently wash with scrungie or clean wascloth    5.  Apply the CHG Soap to your body ONLY FROM THE NECK DOWN.   Do not use on open                           Wound or open sores. Avoid contact with eyes, ears mouth and genitals (private parts).                        Genitals (private parts) with your normal soap.              6.  Wash thoroughly, paying special attention to the area where your surgery  will be performed.   7.  Thoroughly rinse your body with warm water from the neck down.   8.  DO NOT shower/wash with your normal soap after using and rinsing off  the CHG Soap .                9.  Pat yourself dry with a clean towel.             10.  Wear clean pajamas.             11.  Place clean sheets on your bed the night of your first shower and do not  sleep with pets.  Day of Surgery : Do not apply any lotions/deodorants the morning of surgery.  Please wear clean clothes to the hospital/surgery center.  FAILURE TO FOLLOW THESE INSTRUCTIONS MAY RESULT IN THE CANCELLATION OF YOUR SURGERY    PATIENT SIGNATURE_________________________________  ______________________________________________________________________    WHAT IS A BLOOD TRANSFUSION? Blood Transfusion Information  A transfusion is the replacement of blood or some of its parts. Blood is made up of multiple cells which provide different functions.  Red blood cells  carry oxygen and are used for blood loss replacement.  White blood cells fight against infection.  Platelets control bleeding.  Plasma helps clot blood.  Other blood products are available for specialized needs, such as hemophilia or other clotting disorders. BEFORE THE TRANSFUSION  Who gives blood for transfusions?   Healthy volunteers who are fully evaluated to make sure their blood is safe. This is blood bank blood. Transfusion therapy is the safest it has ever been in the practice of medicine. Before blood is taken from a donor, a complete history is taken to make sure that person has no history of diseases nor engages in risky social behavior (examples are intravenous drug use or sexual activity with multiple partners). The donor's travel history is screened to minimize risk of transmitting infections, such as malaria. The donated blood is tested for signs of infectious diseases, such as HIV and hepatitis. The blood is then tested to be sure it is compatible with you in order to minimize the chance of a transfusion reaction. If you or a relative donates blood, this is often done in anticipation of surgery and is not appropriate for emergency situations. It takes many days to process the donated blood. RISKS AND COMPLICATIONS Although transfusion therapy is very safe and saves many lives, the main dangers of transfusion include:   Getting an infectious disease.  Developing a transfusion reaction. This is an allergic reaction to something in the blood you were given. Every  precaution is taken to prevent this. The decision to have a blood transfusion has been considered carefully by your caregiver before blood is given. Blood is not given unless the benefits outweigh the risks. AFTER THE TRANSFUSION  Right after receiving a blood transfusion, you will usually feel much better and more energetic. This is especially true if your red blood cells have gotten low (anemic). The transfusion raises  the level of the red blood cells which carry oxygen, and this usually causes an energy increase.  The nurse administering the transfusion will monitor you carefully for complications. HOME CARE INSTRUCTIONS  No special instructions are needed after a transfusion. You may find your energy is better. Speak with your caregiver about any limitations on activity for underlying diseases you may have. SEEK MEDICAL CARE IF:   Your condition is not improving after your transfusion.  You develop redness or irritation at the intravenous (IV) site. SEEK IMMEDIATE MEDICAL CARE IF:  Any of the following symptoms occur over the next 12 hours:  Shaking chills.  You have a temperature by mouth above 102 F (38.9 C), not controlled by medicine.  Chest, back, or muscle pain.  People around you feel you are not acting correctly or are confused.  Shortness of breath or difficulty breathing.  Dizziness and fainting.  You get a rash or develop hives.  You have a decrease in urine output.  Your urine turns a dark color or changes to pink, red, or brown. Any of the following symptoms occur over the next 10 days:  You have a temperature by mouth above 102 F (38.9 C), not controlled by medicine.  Shortness of breath.  Weakness after normal activity.  The white part of the eye turns yellow (jaundice).  You have a decrease in the amount of urine or are urinating less often.  Your urine turns a dark color or changes to pink, red, or brown. Document Released: 01/27/2000 Document Revised: 04/23/2011 Document Reviewed: 09/15/2007 [facility] Patient Information 2014 [place], [place].  _______________________________________________________________________

## 2014-05-15 ENCOUNTER — Other Ambulatory Visit: Payer: Self-pay | Admitting: Urology

## 2014-05-15 DIAGNOSIS — N2889 Other specified disorders of kidney and ureter: Secondary | ICD-10-CM

## 2014-05-17 ENCOUNTER — Encounter (HOSPITAL_COMMUNITY)
Admission: RE | Admit: 2014-05-17 | Discharge: 2014-05-17 | Disposition: A | Discharge status: Home / Self Care | Payer: BLUE CROSS/BLUE SHIELD | Source: Ambulatory Visit | Attending: Urology | Admitting: Urology

## 2014-05-17 ENCOUNTER — Encounter (HOSPITAL_COMMUNITY): Payer: Self-pay

## 2014-05-17 DIAGNOSIS — Z01818 Encounter for other preprocedural examination: Secondary | ICD-10-CM | POA: Insufficient documentation

## 2014-05-17 HISTORY — DX: Other specified disorders of kidney and ureter: N28.89

## 2014-05-17 HISTORY — DX: Vitamin D deficiency, unspecified: E55.9

## 2014-05-17 HISTORY — DX: Reserved for inherently not codable concepts without codable children: IMO0001

## 2014-05-17 HISTORY — DX: Cardiac murmur, unspecified: R01.1

## 2014-05-17 LAB — CBC
HCT: 42.6 % (ref 36.0–46.0)
Hemoglobin: 14.4 g/dL (ref 12.0–15.0)
MCH: 32.4 pg (ref 26.0–34.0)
MCHC: 33.8 g/dL (ref 30.0–36.0)
MCV: 95.7 fL (ref 78.0–100.0)
Platelets: 243 10*3/uL (ref 150–400)
RBC: 4.45 MIL/uL (ref 3.87–5.11)
RDW: 12.3 % (ref 11.5–15.5)
WBC: 4.8 10*3/uL (ref 4.0–10.5)

## 2014-05-17 LAB — COMPREHENSIVE METABOLIC PANEL
ALK PHOS: 80 U/L (ref 39–117)
ALT: 27 U/L (ref 0–35)
AST: 23 U/L (ref 0–37)
Albumin: 4.4 g/dL (ref 3.5–5.2)
Anion gap: 8 (ref 5–15)
BUN: 14 mg/dL (ref 6–23)
CO2: 27 mmol/L (ref 19–32)
Calcium: 10.2 mg/dL (ref 8.4–10.5)
Chloride: 108 mmol/L (ref 96–112)
Creatinine, Ser: 0.83 mg/dL (ref 0.50–1.10)
GFR calc Af Amer: 87 mL/min — ABNORMAL LOW (ref >90)
GFR, EST NON AFRICAN AMERICAN: 75 mL/min — AB (ref >90)
Glucose, Bld: 98 mg/dL (ref 70–99)
POTASSIUM: 4.2 mmol/L (ref 3.5–5.1)
SODIUM: 143 mmol/L (ref 135–145)
TOTAL PROTEIN: 7.1 g/dL (ref 6.0–8.3)
Total Bilirubin: 0.8 mg/dL (ref 0.3–1.2)

## 2014-05-17 LAB — ABO/RH: ABO/RH(D): A POS

## 2014-05-18 LAB — URINE CULTURE

## 2014-05-20 NOTE — Progress Notes (Signed)
Urine culture result faxed to Dr. Louis Meckel

## 2014-05-21 ENCOUNTER — Inpatient Hospital Stay (HOSPITAL_COMMUNITY): Payer: BLUE CROSS/BLUE SHIELD | Admitting: Anesthesiology

## 2014-05-21 ENCOUNTER — Inpatient Hospital Stay (HOSPITAL_COMMUNITY)
Admission: RE | Admit: 2014-05-21 | Discharge: 2014-05-23 | DRG: 657 | Disposition: A | Discharge status: Home / Self Care | Payer: BLUE CROSS/BLUE SHIELD | Source: Ambulatory Visit | Attending: Urology | Admitting: Urology

## 2014-05-21 ENCOUNTER — Encounter (HOSPITAL_COMMUNITY)
Admission: RE | Disposition: A | Discharge status: Home / Self Care | Payer: Self-pay | Source: Ambulatory Visit | Attending: Urology

## 2014-05-21 ENCOUNTER — Encounter (HOSPITAL_COMMUNITY): Payer: Self-pay | Admitting: *Deleted

## 2014-05-21 DIAGNOSIS — E871 Hypo-osmolality and hyponatremia: Secondary | ICD-10-CM | POA: Diagnosis present

## 2014-05-21 DIAGNOSIS — R51 Headache: Secondary | ICD-10-CM | POA: Diagnosis present

## 2014-05-21 DIAGNOSIS — I739 Peripheral vascular disease, unspecified: Secondary | ICD-10-CM | POA: Diagnosis present

## 2014-05-21 DIAGNOSIS — M1990 Unspecified osteoarthritis, unspecified site: Secondary | ICD-10-CM | POA: Diagnosis present

## 2014-05-21 DIAGNOSIS — Z7982 Long term (current) use of aspirin: Secondary | ICD-10-CM

## 2014-05-21 DIAGNOSIS — F324 Major depressive disorder, single episode, in partial remission: Secondary | ICD-10-CM | POA: Diagnosis present

## 2014-05-21 DIAGNOSIS — N2889 Other specified disorders of kidney and ureter: Secondary | ICD-10-CM

## 2014-05-21 DIAGNOSIS — E559 Vitamin D deficiency, unspecified: Secondary | ICD-10-CM | POA: Diagnosis present

## 2014-05-21 DIAGNOSIS — Z87891 Personal history of nicotine dependence: Secondary | ICD-10-CM

## 2014-05-21 DIAGNOSIS — Z79899 Other long term (current) drug therapy: Secondary | ICD-10-CM | POA: Diagnosis not present

## 2014-05-21 DIAGNOSIS — R011 Cardiac murmur, unspecified: Secondary | ICD-10-CM | POA: Diagnosis present

## 2014-05-21 DIAGNOSIS — C642 Malignant neoplasm of left kidney, except renal pelvis: Secondary | ICD-10-CM | POA: Diagnosis present

## 2014-05-21 DIAGNOSIS — F419 Anxiety disorder, unspecified: Secondary | ICD-10-CM | POA: Diagnosis present

## 2014-05-21 DIAGNOSIS — E785 Hyperlipidemia, unspecified: Secondary | ICD-10-CM | POA: Diagnosis present

## 2014-05-21 DIAGNOSIS — I10 Essential (primary) hypertension: Secondary | ICD-10-CM | POA: Diagnosis present

## 2014-05-21 HISTORY — PX: LAPAROSCOPIC NEPHRECTOMY: SHX1930

## 2014-05-21 LAB — BASIC METABOLIC PANEL
Anion gap: 6 (ref 5–15)
BUN: 15 mg/dL (ref 6–23)
CO2: 24 mmol/L (ref 19–32)
Calcium: 9.3 mg/dL (ref 8.4–10.5)
Chloride: 105 mmol/L (ref 96–112)
Creatinine, Ser: 1.23 mg/dL — ABNORMAL HIGH (ref 0.50–1.10)
GFR calc Af Amer: 54 mL/min — ABNORMAL LOW (ref >90)
GFR calc non Af Amer: 47 mL/min — ABNORMAL LOW (ref >90)
GLUCOSE: 164 mg/dL — AB (ref 70–99)
Potassium: 5.3 mmol/L — ABNORMAL HIGH (ref 3.5–5.1)
Sodium: 135 mmol/L (ref 135–145)

## 2014-05-21 LAB — CBC
HCT: 42.9 % (ref 36.0–46.0)
Hemoglobin: 13.8 g/dL (ref 12.0–15.0)
MCH: 30.8 pg (ref 26.0–34.0)
MCHC: 32.2 g/dL (ref 30.0–36.0)
MCV: 95.8 fL (ref 78.0–100.0)
Platelets: 217 10*3/uL (ref 150–400)
RBC: 4.48 MIL/uL (ref 3.87–5.11)
RDW: 12.3 % (ref 11.5–15.5)
WBC: 12.9 10*3/uL — ABNORMAL HIGH (ref 4.0–10.5)

## 2014-05-21 LAB — TYPE AND SCREEN
ABO/RH(D): A POS
ANTIBODY SCREEN: NEGATIVE

## 2014-05-21 SURGERY — NEPHRECTOMY, RADICAL, LAPAROSCOPIC, ADULT
Anesthesia: General | Site: Flank | Laterality: Left

## 2014-05-21 MED ORDER — EZETIMIBE-SIMVASTATIN 10-40 MG PO TABS
1.0000 | ORAL_TABLET | Freq: Every day | ORAL | Status: DC
  Administered 2014-05-21 – 2014-05-22 (×2): 1 via ORAL
  Filled 2014-05-21 (×4): qty 1

## 2014-05-21 MED ORDER — HYDROMORPHONE HCL 1 MG/ML IJ SOLN
0.2500 mg | INTRAMUSCULAR | Status: DC | PRN
  Administered 2014-05-21 (×4): 0.25 mg via INTRAVENOUS

## 2014-05-21 MED ORDER — LIDOCAINE HCL (CARDIAC) 20 MG/ML IV SOLN
INTRAVENOUS | Status: DC | PRN
  Administered 2014-05-21: 100 mg via INTRAVENOUS

## 2014-05-21 MED ORDER — LIDOCAINE HCL (CARDIAC) 20 MG/ML IV SOLN
INTRAVENOUS | Status: AC
  Filled 2014-05-21: qty 5

## 2014-05-21 MED ORDER — DICLOFENAC SODIUM 75 MG PO TBEC
75.0000 mg | DELAYED_RELEASE_TABLET | Freq: Two times a day (BID) | ORAL | Status: DC | PRN
  Filled 2014-05-21: qty 1

## 2014-05-21 MED ORDER — ROCURONIUM BROMIDE 100 MG/10ML IV SOLN
INTRAVENOUS | Status: DC | PRN
  Administered 2014-05-21: 10 mg via INTRAVENOUS
  Administered 2014-05-21: 40 mg via INTRAVENOUS

## 2014-05-21 MED ORDER — LACTATED RINGERS IR SOLN
Status: DC | PRN
  Administered 2014-05-21: 1000 mL

## 2014-05-21 MED ORDER — BUPIVACAINE-EPINEPHRINE (PF) 0.25% -1:200000 IJ SOLN
INTRAMUSCULAR | Status: AC
  Filled 2014-05-21: qty 30

## 2014-05-21 MED ORDER — FENTANYL CITRATE 0.05 MG/ML IJ SOLN
INTRAMUSCULAR | Status: DC | PRN
  Administered 2014-05-21 (×3): 50 ug via INTRAVENOUS
  Administered 2014-05-21: 100 ug via INTRAVENOUS

## 2014-05-21 MED ORDER — GLYCOPYRROLATE 0.2 MG/ML IJ SOLN
INTRAMUSCULAR | Status: DC | PRN
  Administered 2014-05-21: 0.6 mg via INTRAVENOUS
  Administered 2014-05-21: 0.2 mg via INTRAVENOUS

## 2014-05-21 MED ORDER — BUPROPION HCL ER (XL) 150 MG PO TB24: 150.0000 mg | ORAL_TABLET | Freq: Two times a day (BID) | ORAL | Status: DC

## 2014-05-21 MED ORDER — ONDANSETRON HCL 4 MG/2ML IJ SOLN
INTRAMUSCULAR | Status: DC | PRN
  Administered 2014-05-21: 4 mg via INTRAVENOUS

## 2014-05-21 MED ORDER — ONDANSETRON HCL 4 MG/2ML IJ SOLN
4.0000 mg | Freq: Four times a day (QID) | INTRAMUSCULAR | Status: DC | PRN
  Administered 2014-05-22: 4 mg via INTRAVENOUS
  Filled 2014-05-21: qty 2

## 2014-05-21 MED ORDER — ACETAMINOPHEN 10 MG/ML IV SOLN
1000.0000 mg | Freq: Four times a day (QID) | INTRAVENOUS | Status: AC
  Administered 2014-05-21 – 2014-05-22 (×4): 1000 mg via INTRAVENOUS
  Filled 2014-05-21 (×4): qty 100

## 2014-05-21 MED ORDER — DOCUSATE SODIUM 100 MG PO CAPS
100.0000 mg | ORAL_CAPSULE | Freq: Two times a day (BID) | ORAL | Status: DC
  Administered 2014-05-21 – 2014-05-23 (×4): 100 mg via ORAL
  Filled 2014-05-21 (×4): qty 1

## 2014-05-21 MED ORDER — MORPHINE SULFATE 2 MG/ML IJ SOLN: 2.0000 mg | INTRAMUSCULAR | Status: DC | PRN

## 2014-05-21 MED ORDER — SODIUM CHLORIDE 0.9 % IV SOLN
INTRAVENOUS | Status: DC
  Administered 2014-05-21 (×2): via INTRAVENOUS

## 2014-05-21 MED ORDER — ZOLPIDEM TARTRATE 5 MG PO TABS
5.0000 mg | ORAL_TABLET | Freq: Every evening | ORAL | Status: DC | PRN
  Administered 2014-05-23: 5 mg via ORAL
  Filled 2014-05-21: qty 1

## 2014-05-21 MED ORDER — PROPOFOL 10 MG/ML IV BOLUS
INTRAVENOUS | Status: DC | PRN
  Administered 2014-05-21: 200 mg via INTRAVENOUS

## 2014-05-21 MED ORDER — DOCUSATE SODIUM 100 MG PO CAPS: 100.0000 mg | ORAL_CAPSULE | Freq: Two times a day (BID) | ORAL | Status: DC | PRN

## 2014-05-21 MED ORDER — BUPIVACAINE HCL 0.25 % IJ SOLN
INTRAMUSCULAR | Status: DC | PRN
  Administered 2014-05-21: 30 mL

## 2014-05-21 MED ORDER — PRAMIPEXOLE DIHYDROCHLORIDE 0.25 MG PO TABS
0.5000 mg | ORAL_TABLET | Freq: Every evening | ORAL | Status: DC | PRN
  Filled 2014-05-21: qty 2

## 2014-05-21 MED ORDER — CEFAZOLIN SODIUM-DEXTROSE 2-3 GM-% IV SOLR
INTRAVENOUS | Status: AC
  Filled 2014-05-21: qty 50

## 2014-05-21 MED ORDER — DEXAMETHASONE SODIUM PHOSPHATE 10 MG/ML IJ SOLN
INTRAMUSCULAR | Status: DC | PRN
  Administered 2014-05-21: 10 mg via INTRAVENOUS

## 2014-05-21 MED ORDER — ALPRAZOLAM 0.25 MG PO TABS: 0.2500 mg | ORAL_TABLET | Freq: Two times a day (BID) | ORAL | Status: DC | PRN

## 2014-05-21 MED ORDER — CYCLOBENZAPRINE HCL 5 MG PO TABS: 5.0000 mg | ORAL_TABLET | Freq: Three times a day (TID) | ORAL | Status: DC | PRN

## 2014-05-21 MED ORDER — BUSPIRONE HCL 5 MG PO TABS
15.0000 mg | ORAL_TABLET | Freq: Two times a day (BID) | ORAL | Status: DC
  Administered 2014-05-21 – 2014-05-23 (×4): 15 mg via ORAL
  Filled 2014-05-21 (×4): qty 3

## 2014-05-21 MED ORDER — SODIUM CHLORIDE 0.9 % IJ SOLN
INTRAMUSCULAR | Status: DC | PRN
  Administered 2014-05-21: 20 mL

## 2014-05-21 MED ORDER — HYDRALAZINE HCL 20 MG/ML IJ SOLN: 5.0000 mg | INTRAMUSCULAR | Status: DC | PRN

## 2014-05-21 MED ORDER — BUPROPION HCL ER (SR) 150 MG PO TB12
150.0000 mg | ORAL_TABLET | Freq: Two times a day (BID) | ORAL | Status: DC
  Administered 2014-05-21 – 2014-05-22 (×3): 150 mg via ORAL
  Filled 2014-05-21 (×6): qty 1

## 2014-05-21 MED ORDER — SERTRALINE HCL 50 MG PO TABS
150.0000 mg | ORAL_TABLET | Freq: Every day | ORAL | Status: DC
  Administered 2014-05-21 – 2014-05-23 (×3): 150 mg via ORAL
  Filled 2014-05-21 (×5): qty 1

## 2014-05-21 MED ORDER — DEXAMETHASONE SODIUM PHOSPHATE 10 MG/ML IJ SOLN
INTRAMUSCULAR | Status: AC
  Filled 2014-05-21: qty 1

## 2014-05-21 MED ORDER — CEFAZOLIN SODIUM-DEXTROSE 2-3 GM-% IV SOLR
2.0000 g | Freq: Once | INTRAVENOUS | Status: AC
  Administered 2014-05-21: 2 g via INTRAVENOUS

## 2014-05-21 MED ORDER — SODIUM CHLORIDE 0.9 % IJ SOLN
INTRAMUSCULAR | Status: AC
  Filled 2014-05-21: qty 20

## 2014-05-21 MED ORDER — PROPOFOL 10 MG/ML IV BOLUS
INTRAVENOUS | Status: AC
  Filled 2014-05-21: qty 20

## 2014-05-21 MED ORDER — FENTANYL CITRATE 0.05 MG/ML IJ SOLN
INTRAMUSCULAR | Status: AC
  Filled 2014-05-21: qty 5

## 2014-05-21 MED ORDER — MIDAZOLAM HCL 2 MG/2ML IJ SOLN
INTRAMUSCULAR | Status: AC
  Filled 2014-05-21: qty 2

## 2014-05-21 MED ORDER — ROCURONIUM BROMIDE 100 MG/10ML IV SOLN
INTRAVENOUS | Status: AC
  Filled 2014-05-21: qty 1

## 2014-05-21 MED ORDER — HYDROMORPHONE HCL 1 MG/ML IJ SOLN
INTRAMUSCULAR | Status: AC
  Filled 2014-05-21: qty 1

## 2014-05-21 MED ORDER — OXYCODONE HCL 5 MG PO TABS: 5.0000 mg | ORAL_TABLET | ORAL | Status: DC | PRN

## 2014-05-21 MED ORDER — BUPIVACAINE LIPOSOME 1.3 % IJ SUSP
20.0000 mL | Freq: Once | INTRAMUSCULAR | Status: AC
  Administered 2014-05-21: 20 mL
  Filled 2014-05-21: qty 20

## 2014-05-21 MED ORDER — OXYCODONE HCL 5 MG PO TABS
5.0000 mg | ORAL_TABLET | ORAL | Status: DC | PRN
  Administered 2014-05-21 – 2014-05-22 (×3): 10 mg via ORAL
  Filled 2014-05-21 (×3): qty 2

## 2014-05-21 MED ORDER — HEPARIN SODIUM (PORCINE) 5000 UNIT/ML IJ SOLN
5000.0000 [IU] | Freq: Three times a day (TID) | INTRAMUSCULAR | Status: DC
  Administered 2014-05-21 – 2014-05-23 (×5): 5000 [IU] via SUBCUTANEOUS
  Filled 2014-05-21 (×5): qty 1

## 2014-05-21 MED ORDER — MIDAZOLAM HCL 5 MG/5ML IJ SOLN
INTRAMUSCULAR | Status: DC | PRN
  Administered 2014-05-21: 2 mg via INTRAVENOUS

## 2014-05-21 MED ORDER — ONDANSETRON HCL 4 MG/2ML IJ SOLN
INTRAMUSCULAR | Status: AC
  Filled 2014-05-21: qty 2

## 2014-05-21 MED ORDER — SUCCINYLCHOLINE CHLORIDE 20 MG/ML IJ SOLN
INTRAMUSCULAR | Status: DC | PRN
  Administered 2014-05-21: 100 mg via INTRAVENOUS

## 2014-05-21 MED ORDER — LACTATED RINGERS IV SOLN
INTRAVENOUS | Status: DC | PRN
  Administered 2014-05-21 (×3): via INTRAVENOUS
  Administered 2014-05-21: 14:00:00

## 2014-05-21 MED ORDER — PROPRANOLOL HCL 10 MG PO TABS: 10.0000 mg | ORAL_TABLET | Freq: Four times a day (QID) | ORAL | Status: DC | PRN

## 2014-05-21 MED ORDER — NEOSTIGMINE METHYLSULFATE 10 MG/10ML IV SOLN
INTRAVENOUS | Status: DC | PRN
  Administered 2014-05-21: 4 mg via INTRAVENOUS

## 2014-05-21 MED ORDER — EPHEDRINE SULFATE 50 MG/ML IJ SOLN
INTRAMUSCULAR | Status: DC | PRN
  Administered 2014-05-21: 5 mg via INTRAVENOUS
  Administered 2014-05-21: 10 mg via INTRAVENOUS

## 2014-05-21 SURGICAL SUPPLY — 60 items
APPLICATOR SURGIFLO ENDO (HEMOSTASIS) IMPLANT
APPLIER CLIP ROT 10 11.4 M/L (STAPLE)
BAG DECANTER FOR FLEXI CONT (MISCELLANEOUS) IMPLANT
BAG ZIPLOCK 12X15 (MISCELLANEOUS) ×3 IMPLANT
BLADE EXTENDED COATED 6.5IN (ELECTRODE) IMPLANT
BLADE SURG SZ10 CARB STEEL (BLADE) ×3 IMPLANT
CHLORAPREP W/TINT 26ML (MISCELLANEOUS) ×3 IMPLANT
CLIP APPLIE ROT 10 11.4 M/L (STAPLE) IMPLANT
CLIP LIGATING HEM O LOK PURPLE (MISCELLANEOUS) ×6 IMPLANT
CLIP LIGATING HEMO LOK XL GOLD (MISCELLANEOUS) ×3 IMPLANT
CLIP LIGATING HEMO O LOK GREEN (MISCELLANEOUS) ×3 IMPLANT
COVER SURGICAL LIGHT HANDLE (MISCELLANEOUS) ×3 IMPLANT
CUTTER FLEX LINEAR 45M (STAPLE) ×3 IMPLANT
DECANTER SPIKE VIAL GLASS SM (MISCELLANEOUS) ×3 IMPLANT
DERMABOND ADVANCED (GAUZE/BANDAGES/DRESSINGS) ×2
DERMABOND ADVANCED .7 DNX12 (GAUZE/BANDAGES/DRESSINGS) ×1 IMPLANT
DRAIN CHANNEL 10F 3/8 F FF (DRAIN) IMPLANT
DRAPE INCISE IOBAN 66X45 STRL (DRAPES) ×3 IMPLANT
DRAPE WARM FLUID 44X44 (DRAPE) ×3 IMPLANT
ELECT REM PT RETURN 9FT ADLT (ELECTROSURGICAL) ×3
ELECTRODE REM PT RTRN 9FT ADLT (ELECTROSURGICAL) ×1 IMPLANT
EVACUATOR SILICONE 100CC (DRAIN) IMPLANT
FLOSEAL 10ML (HEMOSTASIS) IMPLANT
GLOVE BIOGEL M STRL SZ7.5 (GLOVE) ×3 IMPLANT
GOWN STRL REUS W/TWL LRG LVL3 (GOWN DISPOSABLE) ×6 IMPLANT
HEMOSTAT SURGICEL 4X8 (HEMOSTASIS) ×3 IMPLANT
KIT BASIN OR (CUSTOM PROCEDURE TRAY) ×3 IMPLANT
LIQUID BAND (GAUZE/BANDAGES/DRESSINGS) ×3 IMPLANT
MANIFOLD NEPTUNE II (INSTRUMENTS) ×3 IMPLANT
NEEDLE SPNL 22GX2.5 QUINCKE BK (NEEDLE) ×3 IMPLANT
NEEDLE SPNL 22GX3.5 QUINCKE BK (NEEDLE) ×3 IMPLANT
PEN SKIN MARKING BROAD (MISCELLANEOUS) ×3 IMPLANT
PENCIL BUTTON HOLSTER BLD 10FT (ELECTRODE) ×3 IMPLANT
POSITIONER SURGICAL ARM (MISCELLANEOUS) ×9 IMPLANT
POUCH ENDO CATCH II 15MM (MISCELLANEOUS) ×6 IMPLANT
RELOAD 45 VASCULAR/THIN (ENDOMECHANICALS) ×3 IMPLANT
RELOAD STAPLE TA45 3.5 REG BLU (ENDOMECHANICALS) IMPLANT
RETRACTOR LAPSCP 12X46 CVD (ENDOMECHANICALS) IMPLANT
RTRCTR LAPSCP 12X46 CVD (ENDOMECHANICALS)
SCISSORS LAP 5X35 DISP (ENDOMECHANICALS) IMPLANT
SET IRRIG TUBING LAPAROSCOPIC (IRRIGATION / IRRIGATOR) ×6 IMPLANT
SHEARS HARMONIC ACE PLUS 36CM (ENDOMECHANICALS) ×3 IMPLANT
SLEEVE XCEL OPT CAN 5 100 (ENDOMECHANICALS) ×3 IMPLANT
SPONGE LAP 4X18 X RAY DECT (DISPOSABLE) IMPLANT
SPONGE SURGIFOAM ABS GEL 100 (HEMOSTASIS) IMPLANT
SUT ETHILON 3 0 PS 1 (SUTURE) IMPLANT
SUT MNCRL AB 4-0 PS2 18 (SUTURE) ×6 IMPLANT
SUT PDS AB 0 CT1 36 (SUTURE) ×9 IMPLANT
SUT VIC AB 0 CT1 27 (SUTURE) ×4
SUT VIC AB 0 CT1 27XBRD ANTBC (SUTURE) ×2 IMPLANT
SUT VIC AB 2-0 CT1 27 (SUTURE)
SUT VIC AB 2-0 CT1 27XBRD (SUTURE) IMPLANT
SUT VICRYL 0 UR6 27IN ABS (SUTURE) ×6 IMPLANT
TOWEL OR 17X26 10 PK STRL BLUE (TOWEL DISPOSABLE) ×3 IMPLANT
TRAY FOLEY CATH 16FR SILVER (SET/KITS/TRAYS/PACK) ×3 IMPLANT
TRAY FOLEY CATH 16FRSI W/METER (SET/KITS/TRAYS/PACK) IMPLANT
TRAY LAPAROSCOPIC (CUSTOM PROCEDURE TRAY) ×3 IMPLANT
TROCAR BLADELESS OPT 5 100 (ENDOMECHANICALS) ×3 IMPLANT
TROCAR XCEL 12X100 BLDLESS (ENDOMECHANICALS) ×3 IMPLANT
YANKAUER SUCT BULB TIP 10FT TU (MISCELLANEOUS) ×3 IMPLANT

## 2014-05-21 NOTE — Transfer of Care (Signed)
Immediate Anesthesia Transfer of Care Note  Patient: Krista Hodges  Procedure(s) Performed: Procedure(s): LEFT RADICAL LAPAROSCOPIC NEPHRECTOMY (Left)  Patient Location: PACU  Anesthesia Type:General  Level of Consciousness: sedated  Airway & Oxygen Therapy: Patient Spontanous Breathing and Patient connected to face mask oxygen  Post-op Assessment: Report given to RN and Post -op Vital signs reviewed and stable  Post vital signs: Reviewed and stable  Last Vitals:  Filed Vitals:   05/21/14 0701  BP: 139/83  Pulse: 64  Temp: 36.3 C  Resp: 18    Complications: No apparent anesthesia complications

## 2014-05-21 NOTE — Anesthesia Preprocedure Evaluation (Signed)
Anesthesia Evaluation  Patient identified by MRN, date of birth, ID band Patient awake    Reviewed: Allergy & Precautions, H&P , NPO status , Patient's Chart, lab work & pertinent test results  Airway Mallampati: III  TM Distance: >3 FB Neck ROM: Full    Dental no notable dental hx. (+) Teeth Intact, Dental Advisory Given   Pulmonary neg pulmonary ROS, former smoker,  breath sounds clear to auscultation  Pulmonary exam normal       Cardiovascular hypertension, Pt. on medications + Peripheral Vascular Disease Rhythm:Regular Rate:Normal     Neuro/Psych  Headaches, Anxiety Depression    GI/Hepatic negative GI ROS, Neg liver ROS,   Endo/Other  negative endocrine ROS  Renal/GU negative Renal ROS  negative genitourinary   Musculoskeletal  (+) Arthritis -, Osteoarthritis,    Abdominal   Peds  Hematology negative hematology ROS (+)   Anesthesia Other Findings   Reproductive/Obstetrics negative OB ROS                             Anesthesia Physical Anesthesia Plan  ASA: II  Anesthesia Plan: General   Post-op Pain Management:    Induction: Intravenous  Airway Management Planned: Oral ETT  Additional Equipment:   Intra-op Plan:   Post-operative Plan: Extubation in OR  Informed Consent: I have reviewed the patients History and Physical, chart, labs and discussed the procedure including the risks, benefits and alternatives for the proposed anesthesia with the patient or authorized representative who has indicated his/her understanding and acceptance.   Dental advisory given  Plan Discussed with: CRNA  Anesthesia Plan Comments:         Anesthesia Quick Evaluation

## 2014-05-21 NOTE — H&P (Signed)
Reason For Visit left enhancing renal mass   History of Present Illness 61F seen in close follow-up recent hospitalization for acute pancreatitis where she found to have an incidental left enhancing renal mass. Prior to this hospitalization, the patient denies any flank pain. She denies any gross hematuria. She denies any night sweats, weight loss, or constitutional symptoms. She denies any back or bone pain. The patient has no family history of GU malignancies.   Past Medical History Problems  1. History of Adiposity (E66.9) 2. History of Amnesia (R41.3) 3. History of Anxiety (F41.9) 4. History of Cervical radiculitis (M54.12) 5. History of arthritis (Z87.39) 6. History of chronic fatigue syndrome (Z87.898) 7. 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 History of depression (Z86.59) 9. History of hyperlipidemia (Z86.39) 10. History of Restless leg (G25.81)  Surgical History Problems  1. History of Cesarean Section 2. History of Jaw Surgery  Current Meds 1. ALPRAZolam 0.25 MG Oral Tablet;  Therapy: (Recorded:23Feb2016) to Recorded 2. Aspir-81 81 MG Oral Tablet Delayed Release;  Therapy: (Recorded:23Feb2016) to Recorded 3. Aspirin 81 MG Oral Tablet;  Therapy: (Recorded:23Feb2016) to Recorded 4. BusPIRone HCl - 15 MG Oral Tablet;  Therapy: (Recorded:23Feb2016) to Recorded 5. Hydrochlorothiazide 25 MG Oral Tablet;  Therapy: (Recorded:23Feb2016) to Recorded 6. Irbesartan 300 MG Oral Tablet;  Therapy: (Recorded:23Feb2016) to Recorded 7. Meloxicam 15 MG Oral Tablet;  Therapy: (Recorded:23Feb2016) to Recorded 8. Midrin 325-65-100 MG CAPS;  Therapy: (Recorded:23Feb2016) to Recorded 9. Mobic 15 MG Oral Tablet;  Therapy: (Recorded:23Feb2016) to Recorded 10. Pramipexole Dihydrochloride 0.5 MG Oral Tablet;   Therapy: (Recorded:23Feb2016) to Recorded 11. Propranolol HCl - 10 MG Oral Tablet;   Therapy: (Recorded:23Feb2016) to Recorded 12. Sertraline HCl -  100 MG Oral Tablet;   Therapy: (Recorded:23Feb2016) to Recorded 13. TiZANidine HCl - 4 MG Oral Tablet;   Therapy: (Recorded:23Feb2016) to Recorded 14. Vitamin D TABS;   Therapy: (Recorded:23Feb2016) to Recorded 15. Vitamin D3 10000 UNIT Oral Capsule;   Therapy: (Recorded:23Feb2016) to Recorded 16. Vytorin 10-40 MG Oral Tablet;   Therapy: (Recorded:23Feb2016) to Recorded 17. Wellbutrin XL 150 MG Oral Tablet Extended Release 24 Hour;   Therapy: (Recorded:23Feb2016) to Recorded  Family History Problems  1. Family history of colon cancer (Z80.0) : Mother 2. Family history of malignant neoplasm of breast (Z80.3) : Sister  Social History Problems    Alcohol use (Z78.9)   Caffeine use (F15.90)   Former smoker (Z87.891)   1/2ppd x 12 years quit 6  years   Married  Review of Systems Genitourinary, constitutional, skin, eye, otolaryngeal, hematologic/lymphatic, cardiovascular, pulmonary, endocrine, musculoskeletal, gastrointestinal, neurological and psychiatric system(s) were reviewed and pertinent findings if present are noted and are otherwise negative.    Vitals Vital Signs [Data Includes: Last 1 Day]  Recorded: 23Feb2016 01:28PM  Height: 5 ft 5.5 in Weight: 202 lb  BMI Calculated: 33.1 BSA Calculated: 2 Blood Pressure: 129 / 73 Temperature: 97.1 F Heart Rate: 64  Physical Exam Constitutional: Well nourished and well developed . No acute distress.  ENT:. The ears and nose are normal in appearance.  Neck: The appearance of the neck is normal and no neck mass is present.  Pulmonary: No respiratory distress and normal respiratory rhythm and effort.  Cardiovascular: Heart rate and rhythm are normal . No peripheral edema.  Abdomen: The abdomen is soft and nontender. No masses are palpated. No CVA tenderness. No hernias are palpable. No hepatosplenomegaly noted. Loser midline scar.  Lymphatics: The femoral and inguinal nodes are not enlarged or tender.  Skin: Normal skin  turgor, no visible rash and no visible skin lesions.  Neuro/Psych:. Mood and affect are appropriate.    Results/Data Urine [Data Includes: Last 1 Day]   [id number]  COLOR STRAW   APPEARANCE CLEAR   SPECIFIC GRAVITY <1.005   pH 5.5   GLUCOSE NEG mg/dL  BILIRUBIN NEG   KETONE NEG mg/dL  BLOOD NEG   PROTEIN NEG mg/dL  UROBILINOGEN 0.2 mg/dL  NITRITE NEG   LEUKOCYTE ESTERASE SMALL   SQUAMOUS EPITHELIAL/HPF FEW   WBC 3-6 WBC/hpf  RBC 0-2 RBC/hpf  BACTERIA RARE   CRYSTALS NONE SEEN   CASTS NONE SEEN    The following images/tracing/specimen were independently visualized: . CT scan 04/04/14: Patient has a left lower pole 4cm enhancing mass that abuts the collecting system. On artery that bifurcates early, one vein.    Assessment Assessed  1. Left renal mass (N28.89)  Plan Health Maintenance  1. UA With REFLEX; [Do Not Release]; Status:Complete;   Done: [id number] 12:45PM Left renal mass  2. Follow-up Schedule Surgery Office  Follow-up  Status: Complete  Done: [id number]  Discussion/Summary I did discuss the central nature of this tumor and the technical challenge that it presents. We discussed the safer option of removing her entire kidney. I went over life with one kidney - I don't expect that she'll need dialysis or have renal failure.   We discussed the natural history of renal masses, I recommended that we take this out in the operating room. Most often this is done laparoscopically. The size of the mass is to big and /or the central location prevent a safe attempt at a partial nephrectomy. As such, we did discuss laparoscopic radical nephrectomy as the best treatment option. I went over the operation in detail with the patient including the location of the 4 port sites. I also described the extraction incision. We discussed the risks of the operation including damage to the surrounding structures including all major vessels and nerves, bleeding, infection, and conversion to open  surgery. I then described the expected postoperative course which will include 2 or 3 nights in the hospital. After hearing an understanding process the patient has agreed to proceed with the operation. We will try and get this done in the next 4 to 6 weeks - which should allow her pancreas inflammation to decrease. . The patient will need clearance from her medical doctor prior surgery as well as a complete staging workup including a chest Xray and comprehensive metabolic panel.   Addendum: Chest CXR showed bilateral nodules.  Chest CT scan shows indeterminate bilateral nodules.  Although these need to be followed, this does not change our management at this time.  Plan to proceed with lap nephrectomy.

## 2014-05-21 NOTE — Anesthesia Postprocedure Evaluation (Signed)
  Anesthesia Post-op Note  Patient: Krista Hodges  Procedure(s) Performed: Procedure(s): LEFT RADICAL LAPAROSCOPIC NEPHRECTOMY (Left)  Patient Location: PACU  Anesthesia Type:General  Level of Consciousness: awake and alert   Airway and Oxygen Therapy: Patient Spontanous Breathing  Post-op Pain: moderate  Post-op Assessment: Post-op Vital signs reviewed, Patient's Cardiovascular Status Stable and Respiratory Function Stable  Post-op Vital Signs: Reviewed  Filed Vitals:   05/21/14 1440  BP: 148/74  Pulse: 63  Temp:   Resp: 12    Complications: No apparent anesthesia complications

## 2014-05-21 NOTE — Discharge Instructions (Signed)

## 2014-05-21 NOTE — Anesthesia Procedure Notes (Signed)
Procedure Name: Intubation Date/Time: 05/21/2014 9:00 AM Performed by: Lind Covert Pre-anesthesia Checklist: Patient identified, Emergency Drugs available, Suction available, Patient being monitored and Timeout performed Patient Re-evaluated:Patient Re-evaluated prior to inductionOxygen Delivery Method: Circle system utilized Preoxygenation: Pre-oxygenation with 100% oxygen Intubation Type: IV induction Laryngoscope Size: Mac and 3 Grade View: Grade I Number of attempts: 1 Airway Equipment and Method: Video-laryngoscopy and Stylet Placement Confirmation: ETT inserted through vocal cords under direct vision,  positive ETCO2 and breath sounds checked- equal and bilateral Secured at: 22 cm Tube secured with: Tape Dental Injury: Teeth and Oropharynx as per pre-operative assessment  Comments: Patient told before she has a small mouth short chin elected to use glidescope.

## 2014-05-21 NOTE — Op Note (Signed)
Preoperative diagnosis:  1. Left renal mass   Postoperative diagnosis:  1. same   Procedure: 1. Left radical laparoscopic nephrectomy  Surgeon: Ardis Hughs, MD First Assistant: Sharin Grave, RN  Anesthesia: General  Complications: None  Intraoperative findings: Routine operation, hilum taken without issue. Left adrenal gland was spared.  EBL: 50cc  Specimens: left kidney and proximal ureter  Indication: Krista Hodges is a 59 y.o. patient with a left renal mass.  After reviewing the management options for treatment, he elected to proceed with the above surgical procedure(s). We have discussed the potential benefits and risks of the procedure, side effects of the proposed treatment, the likelihood of the patient achieving the goals of the procedure, and any potential problems that might occur during the procedure or recuperation. Informed consent has been obtained.  Description of procedure:  The patient was taken to the operating room and general anesthesia was induced.  The patient was placed in the dorsal lithotomy position, prepped and draped in the usual sterile fashion, and preoperative antibiotics were administered. A preoperative time-out was performed.   A site was selected lateral to the umbilicus for placement of the camera port. This was placed using a standard open Hassan technique which allowed entry into the peritoneal cavity under direct vision and without difficulty. A 12 mm Hassan cannula was placed and a pneumoperitoneum established. The camera was then used to inspect the abdomen and there was no evidence of any intra-abdominal injuries or other abnormalities. The remaining abdominal ports were then placed. One 5 mm trocar was placed subcostal margin in the left upper quadrant, the second 5 mm trocar was placed laterally to the camera port so as to triangulate the kidney. An assistant port was then placed in between the camera and the left lateral port. The  assistant port was a 12 mm port.   The white line of Toldt was incised allowing the colon to be mobilized medially and the plane between the mesocolon and the anterior layer of Gerota's fascia to be developed and the kidney exposed. The ureter and gonadal vein were identified inferiorly and the ureter was lifted anteriorly off the psoas muscle. The gonadal vein was then dissected out inferior to the lower pole and 2 clips were placed both superiorly and inferiorly and then ligated. A second 5 mm port was placed in the left lower quadrant to help facilitate lifting up of the kidney. Dissection proceeded superiorly along the gonadal vein until the renal vein was identified. The renal hilum was then carefully isolated with a combination of blunt and sharp dissectiong allowing the renal arterial and venous structures to be separated and isolated. The renal artery was isolated and ligated large weck clips and then ligated. The renal vein was then isolated and also ligated and divided with a 45 mm Flex ETS stapler.   Gerota's fascia was intentionally entered superiorly and the space between the adrenal gland and the kidney was developed allowing the adrenal gland to be spared. A third staple load was then used which divided the adrenal gland. once the hilum had been ligated dissection ensued from the inferior pole of the kidney. The ureter was transected placing one on the stay side. The lateral attachments of the kidney were then freed. Our attention was then turned to the upper pole which was dissected off of the spleen and the splenic attachments. The pancreas and colon were noted to be well away from the structures. The remaining of the posterior aspect of  the attachments was then ligated using a Harmonic Scalpel. Once the kidney was freed from its attachments it was shown to medially and the vascular hilum was inspected and noted to be sufficiently hemostatic.  Surgicel placed over  Hilum and adrenal bed.  Insufflation was then turned down to 8 mm mercury and the kidney bed was noted to be hemostatic.   40cc of Exparel was then injected into the left anterior axillary line b/w the iliac crest and the twelfth rib under laparoscopic guidance. The layer between the tranversus abdominus and the internal oblique was targeted.  The kidney/ureter specimen was then placed into a 15 mm Endocatch II retrieval bag, this was passed through the port site of the assistant port and left lower quadrant. The trochars were then removed under visual guidance to ensure no ongoing port site bleeding was occurring. The extraction incision was extended from the 12 mm left lower quadrant port. The external oblique and internal oblique muscles were spread as best as possible with as little muscle fibers ligated as possible in order to safely extracted the specimen. The internal oblique fascia of was then closed with 2-0 Vicryl in a running fashion. The external oblique fascia was closed with a 0 PDS. The camera port was then closed with 2-0 Vicryl the level of the fascia. All incisions were injected with local anesthesia and reapproximated at the skin with 4-0 monocryl sutures. Dermabond was applied to the skin. The patient tolerated the procedure well and without complications and was transferred to the recovery unit in satisfactory condition.

## 2014-05-22 LAB — BASIC METABOLIC PANEL
ANION GAP: 6 (ref 5–15)
BUN: 15 mg/dL (ref 6–23)
CO2: 22 mmol/L (ref 19–32)
Calcium: 9.2 mg/dL (ref 8.4–10.5)
Chloride: 102 mmol/L (ref 96–112)
Creatinine, Ser: 1.26 mg/dL — ABNORMAL HIGH (ref 0.50–1.10)
GFR calc Af Amer: 53 mL/min — ABNORMAL LOW (ref >90)
GFR, EST NON AFRICAN AMERICAN: 45 mL/min — AB (ref >90)
Glucose, Bld: 147 mg/dL — ABNORMAL HIGH (ref 70–99)
Potassium: 4.4 mmol/L (ref 3.5–5.1)
Sodium: 130 mmol/L — ABNORMAL LOW (ref 135–145)

## 2014-05-22 LAB — CBC
HCT: 38.9 % (ref 36.0–46.0)
Hemoglobin: 12.4 g/dL (ref 12.0–15.0)
MCH: 30.6 pg (ref 26.0–34.0)
MCHC: 31.9 g/dL (ref 30.0–36.0)
MCV: 96 fL (ref 78.0–100.0)
PLATELETS: 231 10*3/uL (ref 150–400)
RBC: 4.05 MIL/uL (ref 3.87–5.11)
RDW: 12.3 % (ref 11.5–15.5)
WBC: 11.8 10*3/uL — ABNORMAL HIGH (ref 4.0–10.5)

## 2014-05-22 MED ORDER — ACETAMINOPHEN 325 MG PO TABS
650.0000 mg | ORAL_TABLET | ORAL | Status: DC | PRN
  Administered 2014-05-22 – 2014-05-23 (×3): 650 mg via ORAL
  Filled 2014-05-22 (×3): qty 2

## 2014-05-22 NOTE — Progress Notes (Signed)
Patient ID: Krista Hodges, female   DOB: 24-Sep-1955, 59 y.o.   MRN: [id number] 1 Day Post-Op  Subjective: Krista Hodges is POD #1 from a left nephrectomy.  She is doing well and has only mild soreness.  She is tolerating clears.   She remains weak and has not ambulated much.   Her urine is clear.  Her Cr is 1.26 and her Na is 130.   Hgb is 12.4.  ROS:  Review of Systems  Constitutional: Negative for fever and chills.  Respiratory: Negative for shortness of breath.   Cardiovascular: Negative for chest pain.    Anti-infectives: Anti-infectives    Start     Dose/Rate Route Frequency Ordered Stop   05/21/14 0715  ceFAZolin (ANCEF) IVPB 2 g/50 mL premix     2 g 100 mL/hr over 30 Minutes Intravenous  Once 05/21/14 7846 05/21/14 9629      Current Facility-Administered Medications  Medication Dose Route Frequency Provider Last Rate Last Dose  . 0.9 %  sodium chloride infusion   Intravenous Continuous Ardis Hughs, MD 100 mL/hr at 05/21/14 2342    . acetaminophen (OFIRMEV) IV 1,000 mg  1,000 mg Intravenous 4 times per day Ardis Hughs, MD   1,000 mg at 05/22/14 5284  . ALPRAZolam Duanne Moron) tablet 0.25 mg  0.25 mg Oral BID PRN Ardis Hughs, MD      . buPROPion [facility] SR) 12 hr tablet 150 mg  150 mg Oral BID Ardis Hughs, MD   150 mg at 05/21/14 2247  . busPIRone (BUSPAR) tablet 15 mg  15 mg Oral BID Ardis Hughs, MD   15 mg at 05/21/14 2247  . cyclobenzaprine (FLEXERIL) tablet 5-10 mg  5-10 mg Oral TID PRN Ardis Hughs, MD      . diclofenac (VOLTAREN) EC tablet 75 mg  75 mg Oral BID PRN Ardis Hughs, MD      . docusate sodium (COLACE) capsule 100 mg  100 mg Oral BID Ardis Hughs, MD   100 mg at 05/21/14 1827  . ezetimibe-simvastatin (VYTORIN) 10-40 MG per tablet 1 tablet  1 tablet Oral Daily Ardis Hughs, MD   1 tablet at 05/21/14 2200  . heparin injection 5,000 Units  5,000 Units Subcutaneous 3 times per day Ardis Hughs, MD   5,000 Units  at 05/22/14 0612  . hydrALAZINE (APRESOLINE) injection 5 mg  5 mg Intravenous Q4H PRN Ardis Hughs, MD      . morphine 2 MG/ML injection 2 mg  2 mg Intravenous Q1H PRN Ardis Hughs, MD      . ondansetron [facility]) injection 4 mg  4 mg Intravenous Q6H PRN Ardis Hughs, MD   4 mg at 05/22/14 0053  . oxyCODONE (Oxy IR/ROXICODONE) immediate release tablet 5-10 mg  5-10 mg Oral Q4H PRN Ardis Hughs, MD   10 mg at 05/22/14 0622  . pramipexole (MIRAPEX) tablet 0.5 mg  0.5 mg Oral QHS PRN Ardis Hughs, MD      . propranolol (INDERAL) tablet 10 mg  10 mg Oral Q6H PRN Ardis Hughs, MD      . sertraline (ZOLOFT) tablet 150 mg  150 mg Oral Daily Ardis Hughs, MD   150 mg at 05/21/14 2335  . zolpidem (AMBIEN) tablet 5 mg  5 mg Oral QHS PRN Ardis Hughs, MD         Objective: Vital signs in last 24 hours: Temp:  [97.4  F (36.3 C)-97.8 F (36.6 C)] 97.8 F (36.6 C) (04/09 0601) Pulse Rate:  [61-81] 81 (04/09 0601) Resp:  [12-24] 16 (04/09 0601) BP: (116-155)/(63-79) 139/63 mmHg (04/09 0601) SpO2:  [94 %-100 %] 100 % (04/09 0601)  Intake/Output from previous day: 04/08 0701 - 04/09 0700 In: 4948.3 [P.O.:840; I.V.:3908.3; IV Piggyback:200] Out: 925 [Urine:875; Blood:50] Intake/Output this shift:     Physical Exam  Constitutional: She is well-developed, well-nourished, and in no distress. No distress.  Cardiovascular: Normal rate and regular rhythm.   Pulmonary/Chest: Effort normal and breath sounds normal.  Abdominal: Soft.  Obese with quiet BS.   Wounds intact.  Genitourinary:  Foley draining clear urine.   Musculoskeletal: Normal range of motion. She exhibits no edema or tenderness.  Skin: Skin is warm and dry.  Vitals reviewed.   Lab Results:   Recent Labs  05/21/14 1645 05/22/14 0648  WBC 12.9* 11.8*  HGB 13.8 12.4  HCT 42.9 38.9  PLT 217 231   BMET  Recent Labs  05/21/14 1645 05/22/14 0548  NA 135 130*  K 5.3* 4.4  CL  105 102  CO2 24 22  GLUCOSE 164* 147*  BUN 15 15  CREATININE 1.23* 1.26*  CALCIUM 9.3 9.2   PT/INR No results for input(s): LABPROT, INR in the last 72 hours. ABG No results for input(s): PHART, HCO3 in the last 72 hours.  Invalid input(s): PCO2, PO2  Studies/Results: No results found.   Assessment: s/p Procedure(s): LEFT RADICAL LAPAROSCOPIC NEPHRECTOMY  She is doing well post op.   She has mild hyponatremia and renal insufficiency. Plan: D/C foley today. Increase ambulation. Hep Lok IV. Repeat BMP in am      LOS: 1 day    Hoa Briggs J 05/22/2014

## 2014-05-23 LAB — BASIC METABOLIC PANEL
ANION GAP: 6 (ref 5–15)
BUN: 17 mg/dL (ref 6–23)
CO2: 25 mmol/L (ref 19–32)
Calcium: 9.5 mg/dL (ref 8.4–10.5)
Chloride: 110 mmol/L (ref 96–112)
Creatinine, Ser: 1.34 mg/dL — ABNORMAL HIGH (ref 0.50–1.10)
GFR calc Af Amer: 49 mL/min — ABNORMAL LOW (ref >90)
GFR calc non Af Amer: 42 mL/min — ABNORMAL LOW (ref >90)
Glucose, Bld: 106 mg/dL — ABNORMAL HIGH (ref 70–99)
POTASSIUM: 3.9 mmol/L (ref 3.5–5.1)
Sodium: 141 mmol/L (ref 135–145)

## 2014-05-23 NOTE — Discharge Summary (Signed)
Physician Discharge Summary  Patient ID: Krista Hodges MRN: [id number] DOB/AGE: 09/25/1955 59 y.o.  Admit date: 05/21/2014 Discharge date: 05/23/2014  Admission Diagnoses:  Left renal mass  Discharge Diagnoses:  Principal Problem:   Left renal mass   Past Medical History  Diagnosis Date  . Atypical migraine   . HLD (hyperlipidemia)   . Premature complex, ventricular   . 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 Essential (primary) hypertension   . Allergic rhinitis   . Anxiety state   . Cervical radiculitis   . Depression, neurotic   . CFIDS (chronic fatigue and immune dysfunction syndrome)   . Inflammation of hand joint   . Depression, major, single episode, in partial remission   . Arthritis, degenerative   . Awareness of heartbeats   . Restless leg   . Heart murmur   . Shortness of breath dyspnea     occasional  . Renal mass, left   . Vitamin D deficiency     Surgeries: Procedure(s): LEFT RADICAL LAPAROSCOPIC NEPHRECTOMY on 05/21/2014   Consultants (if any):    Discharged Condition: Improved  Hospital Course: Krista Hodges is an 59 y.o. female who was admitted 05/21/2014 with a diagnosis of Left renal mass and went to the operating room on 05/21/2014 and underwent the above named procedures.  She had an uneventful post op course and is ready for discharge today.  Her path is pending.  Her Cr is up to 1.34.    She was given perioperative antibiotics:      Anti-infectives    Start     Dose/Rate Route Frequency Ordered Stop   05/21/14 0715  ceFAZolin (ANCEF) IVPB 2 g/50 mL premix     2 g 100 mL/hr over 30 Minutes Intravenous  Once 05/21/14 0702 05/21/14 0906    .  She was given sequential compression devices for DVT prophylaxis.  She benefited maximally from the hospital stay and there were no complications.    Recent vital signs:  Filed Vitals:   05/23/14 0544  BP: 154/65  Pulse: 72  Temp: 97.5 F (36.4 C)  Resp: 16    Recent  laboratory studies:  Lab Results  Component Value Date   HGB 12.4 05/22/2014   HGB 13.8 05/21/2014   HGB 14.4 05/17/2014   Lab Results  Component Value Date   WBC 11.8* 05/22/2014   PLT 231 05/22/2014   No results found for: INR Lab Results  Component Value Date   NA 141 05/23/2014   K 3.9 05/23/2014   CL 110 05/23/2014   CO2 25 05/23/2014   BUN 17 05/23/2014   CREATININE 1.34* 05/23/2014   GLUCOSE 106* 05/23/2014    Discharge Medications:     Medication List    STOP taking these medications        ciprofloxacin 500 MG tablet  Commonly known as:  CIPRO     meloxicam 15 MG tablet  Commonly known as:  MOBIC     traMADol 50 MG tablet  Commonly known as:  ULTRAM      TAKE these medications        ALPRAZolam 0.25 MG tablet  Commonly known as:  XANAX  Take 0.25 mg by mouth 2 (two) times daily as needed for anxiety.     aspirin 81 MG tablet  Take 81 mg by mouth daily.     buPROPion 150 MG 12 hr tablet  Commonly known as:  WELLBUTRIN SR  Take 150 mg  by mouth 2 (two) times daily.     busPIRone 15 MG tablet  Commonly known as:  BUSPAR  Take 15 mg by mouth 2 (two) times daily.     cyclobenzaprine 10 MG tablet  Commonly known as:  FLEXERIL  Take 5-10 mg by mouth 3 (three) times daily as needed for muscle spasms.     diclofenac 75 MG EC tablet  Commonly known as:  VOLTAREN  Take 75 mg by mouth 2 (two) times daily as needed for mild pain.     docusate sodium 100 MG capsule  Commonly known as:  COLACE  Take 1 capsule (100 mg total) by mouth 2 (two) times daily as needed (take to keep stool soft.).     ezetimibe-simvastatin 10-40 MG per tablet  Commonly known as:  VYTORIN  Take 1 tablet by mouth daily.     hydrochlorothiazide 25 MG tablet  Commonly known as:  HYDRODIURIL  Take 25 mg by mouth daily.     irbesartan 300 MG tablet  Commonly known as:  AVAPRO  Take 150 mg by mouth daily.     isometheptene-acetaminophen-dichloralphenazone 65-325-100 MG  capsule  Commonly known as:  MIDRIN  Take 1 capsule by mouth every 4 (four) hours as needed for migraine. Maximum 5 capsules in 12 hours for migraine headaches, 8 capsules in 24 hours for tension headaches.     multivitamin with minerals Tabs tablet  Take 1 tablet by mouth daily.     OVER THE COUNTER MEDICATION  Take 1 tablet by mouth 2 (two) times daily. Focus factor     oxyCODONE 5 MG immediate release tablet  Commonly known as:  ROXICODONE  Take 1 tablet (5 mg total) by mouth every 4 (four) hours as needed.     pramipexole 0.5 MG tablet  Commonly known as:  MIRAPEX  Take 0.5 mg by mouth at bedtime as needed (Restless leg).     propranolol 10 MG tablet  Commonly known as:  INDERAL  Take 1 tablet (10 mg total) by mouth every 6 (six) hours as needed (for palpitations).     sertraline 100 MG tablet  Commonly known as:  ZOLOFT  Take 150 mg by mouth daily.     SYSTANE OP  Apply 1 drop to eye daily.     Vitamin D3 10000 UNITS capsule  Take 30,000 Units by mouth 3 (three) times a week. Mon, Wed, Fri        Diagnostic Studies: Dg Chest 2 View  05/07/2014   CLINICAL DATA:  Known left renal mass, preoperative study; history of previous tobacco use, hypertension  EXAM: CHEST  2 VIEW  COMPARISON:  PA and lateral chest x-ray of October 08, 2008  FINDINGS: The lungs are less well inflated today. There is no focal infiltrate. There is no pleural effusion. There is no pulmonary parenchymal mass. Subtle areas of nodularity in the mid and lower lungs measuring under 4 mm are demonstrated. The heart and pulmonary vascularity are normal. The bony thorax exhibits no acute abnormality.  IMPRESSION: Subtle subcentimeter nodularity in both lungs. While some of the findings may reflect pulmonary vessels on end, a tiny parenchymal nodules are not excluded. Chest CT scanning is recommended given the known renal mass.   Electronically Signed   By: David  Martinique   On: 05/07/2014 10:40    Disposition:  01-Home or Self Care  Discharge Instructions    Discontinue IV    Complete by:  As directed  Follow-up Information    Follow up with Ardis Hughs, MD On 06/04/2014.   Specialty:  Urology   Why:  3p   Contact information:   [place] [address] [phone]        Signed: Malka So 05/23/2014, 9:48 AM

## 2014-05-24 ENCOUNTER — Encounter (HOSPITAL_COMMUNITY): Payer: Self-pay | Admitting: Urology

## 2014-11-10 ENCOUNTER — Other Ambulatory Visit: Payer: Self-pay | Admitting: Urology

## 2014-11-10 ENCOUNTER — Ambulatory Visit
Admission: RE | Admit: 2014-11-10 | Discharge: 2014-11-10 | Disposition: A | Discharge status: Home / Self Care | Payer: BLUE CROSS/BLUE SHIELD | Source: Ambulatory Visit | Attending: Urology | Admitting: Urology

## 2014-11-10 DIAGNOSIS — C642 Malignant neoplasm of left kidney, except renal pelvis: Secondary | ICD-10-CM

## 2014-12-23 ENCOUNTER — Telehealth: Payer: Self-pay | Admitting: Genetic Counselor

## 2014-12-23 NOTE — Telephone Encounter (Signed)
Lt mess regarding genetic counseling referral °

## 2014-12-27 ENCOUNTER — Telehealth: Payer: Self-pay | Admitting: Genetic Counselor

## 2014-12-27 NOTE — Telephone Encounter (Signed)
[facility] MESS REGARDING GENETIC COUNSELING REFERRAL °

## 2014-12-29 ENCOUNTER — Other Ambulatory Visit: Payer: Self-pay | Admitting: Obstetrics and Gynecology

## 2014-12-29 ENCOUNTER — Other Ambulatory Visit (HOSPITAL_COMMUNITY)
Admission: RE | Admit: 2014-12-29 | Discharge: 2014-12-29 | Disposition: A | Discharge status: Home / Self Care | Payer: BLUE CROSS/BLUE SHIELD | Source: Ambulatory Visit | Attending: Obstetrics and Gynecology | Admitting: Obstetrics and Gynecology

## 2014-12-29 DIAGNOSIS — Z01419 Encounter for gynecological examination (general) (routine) without abnormal findings: Secondary | ICD-10-CM | POA: Diagnosis present

## 2014-12-29 DIAGNOSIS — Z1151 Encounter for screening for human papillomavirus (HPV): Secondary | ICD-10-CM | POA: Diagnosis not present

## 2014-12-30 LAB — CYTOLOGY - PAP

## 2015-01-14 ENCOUNTER — Other Ambulatory Visit: Payer: Self-pay | Admitting: Gastroenterology

## 2015-05-03 ENCOUNTER — Emergency Department (HOSPITAL_COMMUNITY)
Admission: EM | Admit: 2015-05-03 | Discharge: 2015-05-03 | Disposition: A | Discharge status: Home / Self Care | Payer: BLUE CROSS/BLUE SHIELD | Attending: Emergency Medicine | Admitting: Emergency Medicine

## 2015-05-03 ENCOUNTER — Encounter (HOSPITAL_COMMUNITY): Payer: Self-pay | Admitting: Emergency Medicine

## 2015-05-03 DIAGNOSIS — R011 Cardiac murmur, unspecified: Secondary | ICD-10-CM | POA: Insufficient documentation

## 2015-05-03 DIAGNOSIS — R319 Hematuria, unspecified: Secondary | ICD-10-CM | POA: Diagnosis not present

## 2015-05-03 DIAGNOSIS — I10 Essential (primary) hypertension: Secondary | ICD-10-CM | POA: Diagnosis not present

## 2015-05-03 DIAGNOSIS — R109 Unspecified abdominal pain: Secondary | ICD-10-CM | POA: Diagnosis not present

## 2015-05-03 HISTORY — DX: Disorder of kidney and ureter, unspecified: N28.9

## 2015-05-03 LAB — URINALYSIS, ROUTINE W REFLEX MICROSCOPIC
Bilirubin Urine: NEGATIVE
Glucose, UA: NEGATIVE mg/dL
Ketones, ur: NEGATIVE mg/dL
Nitrite: NEGATIVE
PROTEIN: NEGATIVE mg/dL
Specific Gravity, Urine: 1.02 (ref 1.005–1.030)
pH: 6 (ref 5.0–8.0)

## 2015-05-03 LAB — URINE MICROSCOPIC-ADD ON

## 2015-05-03 NOTE — ED Notes (Signed)
Patient complains of right flank pain x1 week. Patient had left kidney removed in the past. Patient has been passing blood in urine since last night. Denies nausea, vomiting, diarrhea, fever.

## 2015-05-03 NOTE — ED Notes (Signed)
NO ANSWER IN WR

## 2015-05-03 NOTE — ED Notes (Signed)
Writer called for room assignment no response 

## 2015-05-04 ENCOUNTER — Other Ambulatory Visit: Payer: Self-pay | Admitting: Internal Medicine

## 2015-05-04 ENCOUNTER — Ambulatory Visit
Admission: RE | Admit: 2015-05-04 | Discharge: 2015-05-04 | Disposition: A | Discharge status: Home / Self Care | Payer: BLUE CROSS/BLUE SHIELD | Source: Ambulatory Visit | Attending: Internal Medicine | Admitting: Internal Medicine

## 2015-05-04 DIAGNOSIS — R944 Abnormal results of kidney function studies: Secondary | ICD-10-CM

## 2015-05-04 DIAGNOSIS — R109 Unspecified abdominal pain: Secondary | ICD-10-CM

## 2015-05-04 DIAGNOSIS — Z905 Acquired absence of kidney: Secondary | ICD-10-CM

## 2015-05-04 DIAGNOSIS — M549 Dorsalgia, unspecified: Secondary | ICD-10-CM

## 2015-05-25 ENCOUNTER — Ambulatory Visit
Admission: RE | Admit: 2015-05-25 | Discharge: 2015-05-25 | Disposition: A | Discharge status: Home / Self Care | Payer: BLUE CROSS/BLUE SHIELD | Source: Ambulatory Visit | Attending: Urology | Admitting: Urology

## 2015-05-25 ENCOUNTER — Other Ambulatory Visit: Payer: Self-pay | Admitting: Urology

## 2015-05-25 DIAGNOSIS — C642 Malignant neoplasm of left kidney, except renal pelvis: Secondary | ICD-10-CM

## 2015-10-03 ENCOUNTER — Other Ambulatory Visit: Payer: Self-pay | Admitting: Internal Medicine

## 2015-10-03 ENCOUNTER — Ambulatory Visit
Admission: RE | Admit: 2015-10-03 | Discharge: 2015-10-03 | Disposition: A | Discharge status: Home / Self Care | Payer: BLUE CROSS/BLUE SHIELD | Source: Ambulatory Visit | Attending: Internal Medicine | Admitting: Internal Medicine

## 2015-10-03 DIAGNOSIS — M7989 Other specified soft tissue disorders: Secondary | ICD-10-CM

## 2015-10-28 ENCOUNTER — Other Ambulatory Visit: Payer: Self-pay | Admitting: Internal Medicine

## 2015-10-28 ENCOUNTER — Ambulatory Visit
Admission: RE | Admit: 2015-10-28 | Discharge: 2015-10-28 | Disposition: A | Discharge status: Home / Self Care | Payer: No Typology Code available for payment source | Source: Ambulatory Visit | Attending: Internal Medicine | Admitting: Internal Medicine

## 2015-10-28 DIAGNOSIS — M25561 Pain in right knee: Secondary | ICD-10-CM

## 2015-10-28 DIAGNOSIS — M25461 Effusion, right knee: Secondary | ICD-10-CM

## 2015-11-17 ENCOUNTER — Other Ambulatory Visit: Payer: Self-pay | Admitting: Nurse Practitioner

## 2015-12-07 ENCOUNTER — Other Ambulatory Visit: Payer: Self-pay | Admitting: Internal Medicine

## 2015-12-07 DIAGNOSIS — Z1231 Encounter for screening mammogram for malignant neoplasm of breast: Secondary | ICD-10-CM

## 2015-12-26 ENCOUNTER — Ambulatory Visit
Admission: RE | Admit: 2015-12-26 | Discharge: 2015-12-26 | Disposition: A | Discharge status: Home / Self Care | Payer: No Typology Code available for payment source | Source: Ambulatory Visit | Attending: Internal Medicine | Admitting: Internal Medicine

## 2015-12-26 DIAGNOSIS — Z1231 Encounter for screening mammogram for malignant neoplasm of breast: Secondary | ICD-10-CM

## 2015-12-27 HISTORY — PX: ROBOTIC ASSISTED TOTAL HYSTERECTOMY WITH BILATERAL SALPINGO OOPHERECTOMY: SHX6086

## 2016-03-15 ENCOUNTER — Encounter: Payer: Self-pay | Admitting: Neurology

## 2016-03-15 ENCOUNTER — Ambulatory Visit (INDEPENDENT_AMBULATORY_CARE_PROVIDER_SITE_OTHER): Payer: No Typology Code available for payment source | Admitting: Neurology

## 2016-03-15 VITALS — BP 119/77 | HR 70 | Ht 65.0 in | Wt 176.2 lb

## 2016-03-15 DIAGNOSIS — F05 Delirium due to known physiological condition: Secondary | ICD-10-CM

## 2016-03-15 DIAGNOSIS — C801 Malignant (primary) neoplasm, unspecified: Secondary | ICD-10-CM

## 2016-03-15 DIAGNOSIS — R2689 Other abnormalities of gait and mobility: Secondary | ICD-10-CM | POA: Diagnosis not present

## 2016-03-15 DIAGNOSIS — R413 Other amnesia: Secondary | ICD-10-CM | POA: Diagnosis not present

## 2016-03-15 DIAGNOSIS — R7989 Other specified abnormal findings of blood chemistry: Secondary | ICD-10-CM

## 2016-03-15 DIAGNOSIS — R410 Disorientation, unspecified: Secondary | ICD-10-CM

## 2016-03-15 HISTORY — DX: Other specified abnormal findings of blood chemistry: R79.89

## 2016-03-15 NOTE — Progress Notes (Signed)
[facility]    Provider:  Dr Jaynee Eagles Referring Provider: Josetta Huddle, MD Primary Care Physician:  Henrine Screws, MD  CC:  Memory loss  HPI:  Krista Hodges is a 61 y.o. female here as a referral from Dr. Inda Merlin for memory loss. Past medical history hypertension, hyperlipidemia, anxiety, dysthymia, seasonal allergies, degenerative joint disease, trochanteric bursitis, situational stress, migraines, vitamin D deficiency, depression, obesity, restless leg syndrome, hand arthritis, cervical radiculopathy at C5, renal cancer left status post left radical affecting the April 2016, total hysterectomy in November 2017, carpal tunnel on the right 2010. She still smokes cigarettes half to 1 pack a day with occasional alcohol.   She forgets where she puts things or what she is going to say or what she is doing. Started after anesthesia in November. She also feels "off balance". When she was filling out the form this morning she could remember her zip code where she has lived 8-9 years. She has to sit and stare at the computer and can;t remember what button she is supposed to hit. She loses train of thought when talking to someone. She says she forgot how to get to work, she has not been driving since then. She was driving down the highway and she thought she was supposed to turn. Sometimes she is at a stop light and doesn't know where to go. She kept driving that day until she recognized areas and finally made it to work. No accidents in the home, She denies dizziness but when she is walking she feels her balance is "off", no tripping or falling she can;t describe it feels "off". She puts her medications in pill containers and some days forgets to take the medications which is new. She is not sleeping well, her pcp suggested trazodone and a form of melatonin. She tosses and turns a lot. She can sleep 6 hours some night or less. Socially nothing has changed. Not sad or depressed. Tomorrow  she is having a PET scan for lymph nodes found so she has this on her plate. She repeats questions and stories. Husband pays the bills. Husband is here and provides all information. She forgets her dogs names. No difficulty remembering peopl close to her. Father's with parkinson's disease. No alzheimer's dementia. No other medication changes. Memory changes stable not worsening. Husband provides much information. No other focal neurologic deficits, associated symptoms, inciting events or modifiable factors.  Reviewed notes, labs and imaging from outside physicians, which showed:  Reviewed 14 pages of primary care notes, pertinent history recorded includes: Patient complains of insomnia and also memory loss. Patient had surgery for uterine cancer several months ago and her memory has been worse. Also insomnia has been a problem. She is obese. She also has restless legs. Her primary caretaker physicians referred her to neurology for memory loss possibly due to medication or postanesthesia.  BMP April 2016 was unremarkable except for elevated creatinine 1.34, BUN 17 and GFR 42, CBC showed white blood cells 11.8, otherwise normal.  Review of Systems: Patient complains of symptoms per HPI as well as the following symptoms: Memory loss, weight loss, fatigue, palpitations, swelling and legs, joint pain, aching muscles, allergies, confusion, and dizziness/off balance, depression, anxiety, not no sleep, decreased energy, change in appetite. Pertinent negatives per HPI. All others negative.   Social History   Social History  . Marital status: Married    Spouse name: Gershon Mussel  . Number of children: 3  . Years of education: 35   Occupational  History  . nurse    Social History Main Topics  . Smoking status: Former Smoker    Packs/day: 0.50    Quit date: 05/17/2010  . Smokeless tobacco: Never Used  . Alcohol use Yes     Comment: rare  . Drug use: No  . Sexual activity: Not on file   Other Topics Concern    . Not on file   Social History Narrative   Lives with husband    Caffeine use: 1 cup coffee per day    Family History  Problem Relation Age of Onset  . Cancer Mother   . Heart attack Father   . Diabetes Other   . Dementia Neg Hx     Past Medical History:  Diagnosis Date  . Allergic rhinitis   . Anxiety state   . Arthritis, degenerative   . Atypical migraine   . Awareness of heartbeats   . Cancer (HCC)    Left Kidney  . Cervical radiculitis   . CFIDS (chronic fatigue and immune dysfunction syndrome) ([place])   . Depression, major, single episode, in partial remission ([place])   . Depression, neurotic   . Essential (primary) hypertension   . Heart murmur   . HLD (hyperlipidemia)   . Inflammation of hand joint   . Premature complex, ventricular   . Renal disorder   . Renal mass, left   . Restless leg   . Shortness of breath dyspnea    occasional  . 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 Vitamin D deficiency     Past Surgical History:  Procedure Laterality Date  . c sections x 3     . CARPAL TUNNEL RELEASE     rt hand  . LAPAROSCOPIC NEPHRECTOMY Left 05/21/2014   Procedure: LEFT RADICAL LAPAROSCOPIC NEPHRECTOMY;  Surgeon: Ardis Hughs, MD;  Location: [facility];  Service: Urology;  Laterality: Left;  Marland Kitchen MANDIBLE SURGERY    . NEPHRECTOMY TRANSPLANTED ORGAN     Left kidney removed    Current Outpatient Prescriptions  Medication Sig Dispense Refill  . Acetaminophen (TYLENOL) 325 MG CAPS Take 1 capsule by mouth as needed.    Marland Kitchen buPROPion (WELLBUTRIN SR) 150 MG 12 hr tablet Take 150 mg by mouth 2 (two) times daily.    . busPIRone (BUSPAR) 15 MG tablet Take 15 mg by mouth 2 (two) times daily.    . Cholecalciferol (VITAMIN D3) 10000 UNITS capsule Take 20,000 Units by mouth 3 (three) times a week. Mon, Wed, Fri    . CRANBERRY PO Take 1 Dose by mouth daily. 4, 2000mg  daily    . docusate sodium (COLACE) 100 MG capsule Take 1 capsule (100 mg total) by  mouth 2 (two) times daily as needed (take to keep stool soft.). 60 capsule 0  . hydrochlorothiazide (HYDRODIURIL) 25 MG tablet Take 25 mg by mouth daily.    . irbesartan (AVAPRO) 300 MG tablet Take 150 mg by mouth daily.     . Multiple Vitamin (MULTIVITAMIN WITH MINERALS) TABS tablet Take 1 tablet by mouth daily.    Marland Kitchen OVER THE COUNTER MEDICATION Take 1 tablet by mouth 2 (two) times daily. Focus factor    . Polyethyl Glycol-Propyl Glycol (SYSTANE OP) Apply 1 drop to eye daily.    . Probiotic Product (PROBIOTIC PO) Take 1 Dose by mouth daily. Takes  2 gummies daily per patient    . rosuvastatin (CRESTOR) 10 MG tablet Take 10 mg by mouth See admin instructions. Patient  reports she takes 4 days a week    . sertraline (ZOLOFT) 100 MG tablet Take 150 mg by mouth daily.     . traMADol (ULTRAM) 50 MG tablet Take 50 mg by mouth as needed. Takes 1-2 tablets daily at bedtime as needed    . UNABLE TO FIND Take 1 Dose by mouth as needed. Med Name:  Rem fresh 5mg  1-2 capsules at bedtime as needed per patient     No current facility-administered medications for this visit.     Allergies as of 03/15/2016 - Review Complete 03/15/2016  Allergen Reaction Noted  . Penicillins Hives 10/20/2013  . Aspirin  03/15/2016  . Nsaids  03/15/2016  . Requip [ropinirole hcl]  10/20/2013    Vitals: BP 119/77 (BP Location: Left Arm, Patient Position: Sitting, Cuff Size: Large)   Pulse 70   Ht 5\' 5"  (1.651 m)   Wt 176 lb 3.2 oz (79.9 kg)   BMI 29.32 kg/m  Last Weight:  Wt Readings from Last 1 Encounters:  03/15/16 176 lb 3.2 oz (79.9 kg)   Last Height:   Ht Readings from Last 1 Encounters:  03/15/16 5\' 5"  (1.651 m)    Physical exam: Exam: Gen: NAD, conversant, well nourised, overweight , well groomed                     CV: RRR, no MRG. No Carotid Bruits. No peripheral edema, warm, nontender Eyes: Conjunctivae clear without exudates or hemorrhage  Neuro: Detailed Neurologic Exam  Speech:    Speech is  normal; fluent and spontaneous with normal comprehension.  Cognition: MMSE - Mini Mental State Exam 03/15/2016  Orientation to time 5  Orientation to Place 5  Registration 3  Attention/ Calculation 5  Recall 1  Language- name 2 objects 2  Language- repeat 1  Language- follow 3 step command 3  Language- read & follow direction 1  Write a sentence 1  Copy design 1  Total score 28      The patient is oriented to person, place, and time;     recent and remote memory intact;     language fluent;     normal attention, concentration,     fund of knowledge Cranial Nerves:    The pupils are equal, round, and reactive to light. The fundi are normal and spontaneous venous pulsations are present. Visual fields are full to finger confrontation. Extraocular movements are intact. Trigeminal sensation is intact and the muscles of mastication are normal. The face is symmetric. The palate elevates in the midline. Hearing intact. Voice is normal. Shoulder shrug is normal. The tongue has normal motion without fasciculations.   Coordination:    Normal finger to nose and heel to shin. Normal rapid alternating movements.   Gait:    Heel-toe and tandem gait are normal.   Motor Observation:    No asymmetry, no atrophy, and no involuntary movements noted. Tone:    Normal muscle tone.    Posture:    Posture is normal. normal erect    Strength:    Strength is V/V in the upper and lower limbs.      Sensation: intact to LT, Romberg negative     Reflex Exam:  DTR's:    Deep tendon reflexes in the upper and lower extremities are normal bilaterally.   Toes:    The toes are downgoing bilaterally.   Clonus:    Clonus is absent.      Assessment/Plan:  60 year  old with reported cognitive changes after anesthesia. MMSE 28/30.   memory loss and confusion with history of malignancy; recommend MRI brain w/wo contrast to evaluate for other causes of encephalopathy such as metastasis or strokes.  As  far as diagnostic testing: MRI brain, labwork. We can follow this up with formal memory testing and FDG Pet scan if needed after above results  Cc: Dr. Evelina Dun, [facility] [address] [place], [place] [address]  Phone [phone] Fax [phone]

## 2016-03-15 NOTE — Patient Instructions (Addendum)
Remember to drink plenty of fluid, eat healthy meals and do not skip any meals. Try to eat protein with a every meal and eat a healthy snack such as fruit or nuts in between meals. Try to keep a regular sleep-wake schedule and try to exercise daily, particularly in the form of walking, 20-30 minutes a day, if you can.   As far as diagnostic testing: MRI brain, labwork. We can follow this up with formal memory testing and FDG Pet scan if needed  Our phone number is [phone]. We also have an after hours call service for urgent matters and there is a physician on-call for urgent questions. For any emergencies you know to call 911 or go to the nearest emergency room

## 2016-03-16 LAB — SPECIMEN STATUS REPORT

## 2016-03-19 ENCOUNTER — Telehealth: Payer: Self-pay

## 2016-03-19 LAB — VITAMIN B12: Vitamin B-12: 1298 pg/mL — ABNORMAL HIGH (ref 232–1245)

## 2016-03-19 LAB — METHYLMALONIC ACID, SERUM: METHYLMALONIC ACID: 259 nmol/L (ref 0–378)

## 2016-03-19 LAB — TSH: TSH: 1.73 u[IU]/mL (ref 0.450–4.500)

## 2016-03-19 LAB — VITAMIN B1: THIAMINE: 131.7 nmol/L (ref 66.5–200.0)

## 2016-03-19 LAB — SEDIMENTATION RATE: SED RATE: 2 mm/h (ref 0–40)

## 2016-03-19 LAB — RPR: RPR: NONREACTIVE

## 2016-03-19 LAB — AMMONIA: Ammonia: 22 ug/dL (ref 19–87)

## 2016-03-19 NOTE — Telephone Encounter (Signed)
-----   Message from Melvenia Beam, MD sent at 03/19/2016  9:21 AM EST ----- Her creatinine is elevated at 2.07 a year ago it was 1.34, she needs to follow with her primary care about this. Otherwise labs are unremarkable. thanks

## 2016-03-19 NOTE — Telephone Encounter (Signed)
Called pt w/ lab results. Instructed to follow-up w/ PCP re: elevated creatinine level. May call back w/ additional questions/conerns.

## 2016-03-21 ENCOUNTER — Telehealth: Payer: Self-pay | Admitting: Neurology

## 2016-03-21 LAB — BUN: BUN: 28 mg/dL — AB (ref 8–27)

## 2016-03-21 LAB — CREATININE, SERUM
CREATININE: 2.07 mg/dL — AB (ref 0.57–1.00)
GFR, EST AFRICAN AMERICAN: 29 mL/min/{1.73_m2} — AB (ref >59)
GFR, EST NON AFRICAN AMERICAN: 25 mL/min/{1.73_m2} — AB (ref >59)

## 2016-03-21 LAB — SPECIMEN STATUS REPORT

## 2016-03-21 NOTE — Telephone Encounter (Signed)
Dr. Jaynee Eagles, yes can you please change the order to MRI wo contrast. Thank you!

## 2016-03-21 NOTE — Telephone Encounter (Signed)
Krista Hodges, patient's creatinine came back elevated so she cannot have contrast, the MRI order needs to be changed. Let me know what I need to do if anything thanks

## 2016-03-21 NOTE — Telephone Encounter (Signed)
Done. thanks

## 2016-03-21 NOTE — Addendum Note (Signed)
Addended by: Sarina Ill B on: 03/21/2016 01:52 PM   Modules accepted: Orders

## 2016-04-21 ENCOUNTER — Ambulatory Visit
Admission: RE | Admit: 2016-04-21 | Discharge: 2016-04-21 | Disposition: A | Discharge status: Home / Self Care | Payer: No Typology Code available for payment source | Source: Ambulatory Visit | Attending: Neurology | Admitting: Neurology

## 2016-04-21 DIAGNOSIS — C801 Malignant (primary) neoplasm, unspecified: Secondary | ICD-10-CM

## 2016-04-21 DIAGNOSIS — F05 Delirium due to known physiological condition: Secondary | ICD-10-CM

## 2016-04-21 DIAGNOSIS — R413 Other amnesia: Secondary | ICD-10-CM

## 2016-04-21 DIAGNOSIS — R2689 Other abnormalities of gait and mobility: Secondary | ICD-10-CM

## 2016-04-21 DIAGNOSIS — R410 Disorientation, unspecified: Secondary | ICD-10-CM

## 2016-05-28 ENCOUNTER — Other Ambulatory Visit: Payer: Self-pay | Admitting: General Surgery

## 2016-05-28 DIAGNOSIS — N63 Unspecified lump in unspecified breast: Secondary | ICD-10-CM

## 2016-05-31 ENCOUNTER — Ambulatory Visit
Admission: RE | Admit: 2016-05-31 | Discharge: 2016-05-31 | Disposition: A | Discharge status: Home / Self Care | Payer: No Typology Code available for payment source | Source: Ambulatory Visit | Attending: General Surgery | Admitting: General Surgery

## 2016-05-31 DIAGNOSIS — N63 Unspecified lump in unspecified breast: Secondary | ICD-10-CM

## 2016-06-12 ENCOUNTER — Ambulatory Visit: Payer: No Typology Code available for payment source | Admitting: Neurology

## 2016-06-13 ENCOUNTER — Encounter: Payer: Self-pay | Admitting: Neurology

## 2016-07-24 ENCOUNTER — Other Ambulatory Visit: Payer: Self-pay | Admitting: Surgery

## 2016-07-24 DIAGNOSIS — D440 Neoplasm of uncertain behavior of thyroid gland: Secondary | ICD-10-CM

## 2016-08-10 ENCOUNTER — Ambulatory Visit
Admission: RE | Admit: 2016-08-10 | Discharge: 2016-08-10 | Disposition: A | Discharge status: Home / Self Care | Payer: No Typology Code available for payment source | Source: Ambulatory Visit | Attending: Surgery | Admitting: Surgery

## 2016-08-10 DIAGNOSIS — D440 Neoplasm of uncertain behavior of thyroid gland: Secondary | ICD-10-CM

## 2016-08-21 ENCOUNTER — Other Ambulatory Visit: Payer: Self-pay | Admitting: Surgery

## 2016-08-21 DIAGNOSIS — D440 Neoplasm of uncertain behavior of thyroid gland: Secondary | ICD-10-CM

## 2016-09-12 ENCOUNTER — Inpatient Hospital Stay
Admission: RE | Admit: 2016-09-12 | Discharge: 2016-09-12 | Disposition: A | Discharge status: Home / Self Care | Payer: No Typology Code available for payment source | Source: Ambulatory Visit | Attending: Surgery | Admitting: Surgery

## 2016-12-19 ENCOUNTER — Ambulatory Visit (INDEPENDENT_AMBULATORY_CARE_PROVIDER_SITE_OTHER): Payer: No Typology Code available for payment source | Admitting: Neurology

## 2016-12-19 ENCOUNTER — Encounter: Payer: Self-pay | Admitting: Neurology

## 2016-12-19 VITALS — BP 122/74 | HR 62 | Ht 65.5 in | Wt 161.8 lb

## 2016-12-19 DIAGNOSIS — R4189 Other symptoms and signs involving cognitive functions and awareness: Secondary | ICD-10-CM | POA: Diagnosis not present

## 2016-12-19 DIAGNOSIS — R413 Other amnesia: Secondary | ICD-10-CM | POA: Diagnosis not present

## 2016-12-19 DIAGNOSIS — R519 Headache, unspecified: Secondary | ICD-10-CM

## 2016-12-19 DIAGNOSIS — R51 Headache: Secondary | ICD-10-CM

## 2016-12-19 NOTE — Progress Notes (Signed)
[facility]    Provider:  Dr Jaynee Eagles Referring Provider: Josetta Huddle, MD Primary Care Physician:  Josetta Huddle, MD   CC:  Memory loss  Interval history 11 /08/2016: Patient is here for follow-up of memory loss.  Krista Hodges is a 61 y.o. female here as a referral from Dr. Inda Merlin for memory loss. Past medical history hypertension, hyperlipidemia, anxiety, dysthymia, seasonal allergies, degenerative joint disease, trochanteric bursitis, situational stress, migraines, vitamin D deficiency, depression, obesity, restless leg syndrome, hand arthritis, cervical radiculopathy at C5, renal cancer left status post left radical affecting the April 2016, total hysterectomy in November 2017, carpal tunnel on the right 2010. She still smokes cigarettes half to 1 pack a day with occasional alcohol.   She is here with husband, she forgets things easy, she has to keep asking things over and over in the same day.  It is getting worse. She has depression, stress, and being forgetful. She goes to bed at different times, it will take an hour to go to sleep and she gets up 3x a night just because, no snoring, extremely fatigued all day, she wakes in the morning with headaches. Here with her husband who also provides information.  Reviewed MRI brain 04/2016:  This MRI of the brain without contrast shows the following: 1.    Mild generalized cortical atrophy. 2.    Few scattered T2/FLAIR hyperintense foci in the hemispheres consistent with age appropriate minimal chronic white vessel ischemic change. 3.    Single right frontal microhemorrhage.   A single focus is unlikely to be clinically significant.Marland Kitchen  HPI:  Krista Hodges is a 61 y.o. female here as a referral from Dr. Inda Merlin for memory loss. Past medical history hypertension, hyperlipidemia, anxiety, dysthymia, seasonal allergies, degenerative joint disease, trochanteric bursitis, situational stress, migraines, vitamin D deficiency, depression,  obesity, restless leg syndrome, hand arthritis, cervical radiculopathy at C5, renal cancer left status post left radical affecting the April 2016, total hysterectomy in November 2017, carpal tunnel on the right 2010. She still smokes cigarettes half to 1 pack a day with occasional alcohol.   She forgets where she puts things or what she is going to say or what she is doing. Started after anesthesia in November. She also feels "off balance". When she was filling out the form this morning she could remember her zip code where she has lived 8-9 years. She has to sit and stare at the computer and can;t remember what button she is supposed to hit. She loses train of thought when talking to someone. She says she forgot how to get to work, she has not been driving since then. She was driving down the highway and she thought she was supposed to turn. Sometimes she is at a stop light and doesn't know where to go. She kept driving that day until she recognized areas and finally made it to work. No accidents in the home, She denies dizziness but when she is walking she feels her balance is "off", no tripping or falling she can;t describe it feels "off". She puts her medications in pill containers and some days forgets to take the medications which is new. She is not sleeping well, her pcp suggested trazodone and a form of melatonin. She tosses and turns a lot. She can sleep 6 hours some night or less. Socially nothing has changed. Not sad or depressed. Tomorrow she is having a PET scan for lymph nodes found so she has this on her plate.  She repeats questions and stories. Husband pays the bills. Husband is here and provides all information. She forgets her dogs names. No difficulty remembering peopl close to her. Father's with parkinson's disease. No alzheimer's dementia. No other medication changes. Memory changes stable not worsening. Husband provides much information. No other focal neurologic deficits, associated  symptoms, inciting events or modifiable factors.  Reviewed notes, labs and imaging from outside physicians, which showed:  Reviewed 14 pages of primary care notes, pertinent history recorded includes: Patient complains of insomnia and also memory loss. Patient had surgery for uterine cancer several months ago and her memory has been worse. Also insomnia has been a problem. She is obese. She also has restless legs. Her primary caretaker physicians referred her to neurology for memory loss possibly due to medication or postanesthesia.  BMP April 2016 was unremarkable except for elevated creatinine 1.34, BUN 17 and GFR 42, CBC showed white blood cells 11.8, otherwise normal.  Review of systems includes positive weight loss, fatigue, memory loss, confusion, headache, insomnia, dizziness, anxiety, depression, change in appetite, not enough sleep otherwise negative.   Social History   Socioeconomic History  . Marital status: Married    Spouse name: Gershon Mussel  . Number of children: 3  . Years of education: 35  . Highest education level: Not on file  Social Needs  . Financial resource strain: Not on file  . Food insecurity - worry: Not on file  . Food insecurity - inability: Not on file  . Transportation needs - medical: Not on file  . Transportation needs - non-medical: Not on file  Occupational History  . Occupation: nurse  Tobacco Use  . Smoking status: Former Smoker    Packs/day: 0.50    Last attempt to quit: 05/17/2010    Years since quitting: 6.5  . Smokeless tobacco: Never Used  Substance and Sexual Activity  . Alcohol use: Yes    Comment: rare  . Drug use: No  . Sexual activity: Not on file  Other Topics Concern  . Not on file  Social History Narrative   Lives with husband    Caffeine use: 1 cup coffee per day   Left handed     Family History  Problem Relation Age of Onset  . Cancer Mother   . Heart attack Father   . Diabetes Other   . Dementia Neg Hx     Past Medical  History:  Diagnosis Date  . Allergic rhinitis   . Anxiety state   . Arthritis, degenerative   . Atypical migraine   . Awareness of heartbeats   . Cancer (HCC)    Left Kidney  . Cervical radiculitis   . CFIDS (chronic fatigue and immune dysfunction syndrome) ([place])   . Depression, major, single episode, in partial remission ([place])   . Depression, neurotic   . Essential (primary) hypertension   . Heart murmur   . HLD (hyperlipidemia)   . Inflammation of hand joint   . Premature complex, ventricular   . Renal disorder   . Renal mass, left   . Restless leg   . Shortness of breath dyspnea    occasional  . 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 Vitamin D deficiency     Past Surgical History:  Procedure Laterality Date  . c sections x 3     . CARPAL TUNNEL RELEASE     rt hand  . MANDIBLE SURGERY    . NEPHRECTOMY TRANSPLANTED ORGAN  Left kidney removed    Current Outpatient Medications  Medication Sig Dispense Refill  . Acetaminophen (TYLENOL) 325 MG CAPS Take 1 capsule by mouth as needed.    Marland Kitchen buPROPion (WELLBUTRIN SR) 150 MG 12 hr tablet Take 150 mg by mouth 2 (two) times daily.    . busPIRone (BUSPAR) 15 MG tablet Take 15 mg by mouth 2 (two) times daily.    . Cholecalciferol (VITAMIN D3) 10000 UNITS capsule Take 20,000 Units by mouth 3 (three) times a week. Mon, Wed, Fri    . CRANBERRY PO Take 1 Dose by mouth daily. 4, 2000mg  daily    . docusate sodium (COLACE) 100 MG capsule Take 1 capsule (100 mg total) by mouth 2 (two) times daily as needed (take to keep stool soft.). 60 capsule 0  . hydrochlorothiazide (HYDRODIURIL) 25 MG tablet Take 25 mg by mouth daily.    . irbesartan (AVAPRO) 300 MG tablet Take 300 mg daily by mouth.     . Multiple Vitamin (MULTIVITAMIN WITH MINERALS) TABS tablet Take 1 tablet by mouth daily.    Marland Kitchen OVER THE COUNTER MEDICATION Take 1 tablet by mouth 2 (two) times daily. Focus factor    . Probiotic Product (PROBIOTIC PO) Take 1  Dose by mouth daily. Takes  2 gummies daily per patient    . rosuvastatin (CRESTOR) 10 MG tablet Take 10 mg by mouth See admin instructions. Patient reports she takes 4 days a week    . sertraline (ZOLOFT) 100 MG tablet Take 150 mg by mouth daily.     . traMADol (ULTRAM) 50 MG tablet Take 50 mg by mouth as needed. Takes 1-2 tablets daily at bedtime as needed    . traZODone (DESYREL) 50 MG tablet Take 50 mg at bedtime by mouth.     No current facility-administered medications for this visit.     Allergies as of 12/19/2016 - Review Complete 12/19/2016  Allergen Reaction Noted  . Penicillins Hives 10/20/2013  . Aspirin  03/15/2016  . Nsaids  03/15/2016  . Requip [ropinirole hcl]  10/20/2013    Vitals: BP 122/74 (BP Location: Right Arm, Patient Position: Sitting, Cuff Size: Normal)   Pulse 62   Ht 5' 5.5" (1.664 m)   Wt 161 lb 12.8 oz (73.4 kg)   BMI 26.52 kg/m  Last Weight:  Wt Readings from Last 1 Encounters:  12/19/16 161 lb 12.8 oz (73.4 kg)   Last Height:   Ht Readings from Last 1 Encounters:  12/19/16 5' 5.5" (1.664 m)   MMSE - Mini Mental State Exam 12/19/2016 03/15/2016  Orientation to time 4 5  Orientation to Place 5 5  Registration 3 3  Attention/ Calculation 5 5  Recall 2 1  Language- name 2 objects 2 2  Language- repeat 1 1  Language- follow 3 step command 3 3  Language- read & follow direction 1 1  Write a sentence 1 1  Copy design 1 1  Total score 28 28   Physical exam: Exam: Gen: NAD, conversant, well nourised, obese, well groomed                     Eyes: Conjunctivae clear without exudates or hemorrhage  Neuro: Detailed Neurologic Exam  Speech:    Speech is normal; fluent and spontaneous with normal comprehension.  Cognition:    The patient is oriented to person, place, and time;   Cranial Nerves:    The pupils are equal, round, and reactive to light. Visual  fields are full to finger confrontation. Extraocular movements are intact. Trigeminal  sensation is intact and the muscles of mastication are normal. The face is symmetric. The palate elevates in the midline. Hearing intact. Voice is normal. Shoulder shrug is normal. The tongue has normal motion without fasciculations.      Motor Observation:    No asymmetry, no atrophy, and no involuntary movements noted. Tone:    Normal muscle tone.    Posture:    Posture is normal. normal erect    Strength:    Strength is V/V in the upper and lower limbs.        Assessment/Plan:  61 year old with reported progressive cognitive changes after anesthesia. MMSE 28/30 stable  Patient returns today still complaining of progressive memory loss. Discussed I believe she has normal cognitive aging along with lots of stress, mood disorder, fatigue.  Husband is becoming very frustrated with wife.  They would like further testing.  Will refer to Dr. Marion Downer for formal neurocognitive testing.  Her Epworth Sleepiness Scale was 10, she wakes multiple times throughout the night, no snoring but does feel fatigued and does not get restful sleep will refer to my colleagues for sleep study.   Cc: Dr. Inda Merlin  Orders Placed This Encounter  Procedures  . Ambulatory referral to Sleep Studies  . Ambulatory referral to Neuropsychology     Sarina Ill, MD  [facility] [address] [place],  [address]  Phone [phone] Fax [phone]  A total of 25 minutes was spent face-to-face with this patient. Over half this time was spent on counseling patient on the memory loss, fatigue, morning headache diagnosis and different diagnostic and therapeutic options available.

## 2016-12-20 ENCOUNTER — Other Ambulatory Visit: Payer: Self-pay | Admitting: Surgery

## 2016-12-20 ENCOUNTER — Encounter: Payer: Self-pay | Admitting: Psychology

## 2016-12-20 DIAGNOSIS — E042 Nontoxic multinodular goiter: Secondary | ICD-10-CM

## 2017-01-14 ENCOUNTER — Other Ambulatory Visit: Payer: No Typology Code available for payment source

## 2017-01-20 ENCOUNTER — Telehealth: Payer: Self-pay

## 2017-01-20 NOTE — Telephone Encounter (Signed)
I called pt, no answer, left a detailed message on pt's cell phone, per DPR, advising her that [facility] will be closed tomorrow and advised her to call back during business hours or send [place] a mychart message to reschedule her appointment. Will also send pt a mychat message with this information.

## 2017-01-21 ENCOUNTER — Institutional Professional Consult (permissible substitution): Payer: No Typology Code available for payment source | Admitting: Neurology

## 2017-01-31 ENCOUNTER — Encounter: Payer: Self-pay | Admitting: Neurology

## 2017-01-31 ENCOUNTER — Ambulatory Visit (INDEPENDENT_AMBULATORY_CARE_PROVIDER_SITE_OTHER): Payer: No Typology Code available for payment source | Admitting: Neurology

## 2017-01-31 ENCOUNTER — Telehealth: Payer: Self-pay | Admitting: Neurology

## 2017-01-31 VITALS — BP 130/76 | HR 66 | Ht 65.5 in | Wt 157.8 lb

## 2017-01-31 DIAGNOSIS — R4189 Other symptoms and signs involving cognitive functions and awareness: Secondary | ICD-10-CM

## 2017-01-31 DIAGNOSIS — R413 Other amnesia: Secondary | ICD-10-CM

## 2017-01-31 DIAGNOSIS — R251 Tremor, unspecified: Secondary | ICD-10-CM | POA: Insufficient documentation

## 2017-01-31 NOTE — Patient Instructions (Signed)
FDG PET Scan EEG in the office and then an extended 72-hour eeg   Seizure, Adult A seizure is a sudden burst of abnormal electrical activity in the brain. The abnormal activity temporarily interrupts normal brain function, causing a person to experience any of the following:  Involuntary movements.  Changes in awareness or consciousness.  Uncontrollable shaking (convulsions).  Seizures usually last from 30 seconds to 2 minutes. They usually do not cause permanent brain damage unless they are prolonged. What can cause a seizure to happen? Seizures can happen for many reasons including:  A fever.  Low blood sugar.  A medicine.  An illnesses.  A brain injury.  Some people who have a seizure never have another one. People who have repeated seizures have a condition called epilepsy. What are the symptoms of a seizure? Symptoms of a seizure vary greatly from person to person. They include:  Convulsions.  Stiffening of the body.  Involuntary movements of the arms or legs.  Loss of consciousness.  Breathing problems.  Falling suddenly.  Confusion.  Head nodding.  Eye blinking or fluttering.  Lip smacking.  Drooling.  Rapid eye movements.  Grunting.  Loss of bladder control and bowel control.  Staring.  Unresponsiveness.  Some people have symptoms right before a seizure happens (aura) and right after a seizure happens. Symptoms of an aura include:  Fear or anxiety.  Nausea.  Feeling like the room is spinning (vertigo).  A feeling of having seen or heard something before (deja vu).  Odd tastes or smells.  Changes in vision, such as seeing flashing lights or spots.  Symptoms that may follow a seizure include:  Confusion.  Sleepiness.  Headache.  Weakness of one side of the body.  Follow these instructions at home: Medicines   Take over-the-counter and prescription medicines only as told by your health care provider.  Avoid any  substances that may prevent your medicine from working properly, such as alcohol. Activity  Do not drive, swim, or do any other activities that would be dangerous if you had another seizure. Wait until your health care provider approves.  If you live in the [place] check with your local DMV (department of motor vehicles) to find out about the local driving laws. Each state has specific rules about when you can legally return to driving.  Get enough rest. Lack of sleep can make seizures more likely to occur. Educating others Teach friends and family what to do if you have a seizure. They should:  Lay you on the ground to prevent a fall.  Cushion your head and body.  Loosen any tight clothing around your neck.  Turn you on your side. If vomiting occurs, this helps keep your airway clear.  Stay with you until you recover.  Not hold you down. Holding you down will not stop the seizure.  Not put anything in your mouth.  Know whether or not you need emergency care.  General instructions  Contact your health care provider each time you have a seizure.  Avoid anything that has ever triggered a seizure for you.  Keep a seizure diary. Record what you remember about each seizure, especially anything that might have triggered the seizure.  Keep all follow-up visits as told by your health care provider. This is important. Contact a health care provider if:  You have another seizure.  You have seizures more often.  Your seizure symptoms change.  You continue to have seizures with treatment.  You have symptoms of  an infection or illness. They might increase your risk of having a seizure. Get help right away if:  You have a seizure: ? That lasts longer than 5 minutes. ? That is different than previous seizures. ? That leaves you unable to speak or use a part of your body. ? That makes it harder to breathe. ? After a head injury.  You have: ? Multiple seizures in a  row. ? Confusion or a severe headache right after a seizure.  You are having seizures more often.  You do not wake up immediately after a seizure.  You injure yourself during a seizure. These symptoms may represent a serious problem that is an emergency. Do not wait to see if the symptoms will go away. Get medical help right away. Call your local emergency services (911 in the [place] Do not drive yourself to the hospital. This information is not intended to replace advice given to you by your health care provider. Make sure you discuss any questions you have with your health care provider. Document Released: 01/27/2000 Document Revised: 09/25/2015 Document Reviewed: 09/02/2015 Elsevier Interactive Patient Education  [employer].

## 2017-01-31 NOTE — Telephone Encounter (Signed)
Pt called today, she is teary eyed. Pt said her memory is worse, confusion. Dr Inda Merlin started her on keppra prior to visit with Dr Jaynee Eagles, this is the only new medication she has started. Please call to advise.

## 2017-01-31 NOTE — Progress Notes (Signed)
[facility]    Provider:  Dr Jaynee Eagles Referring Provider: Josetta Huddle, MD Primary Care Physician:  Josetta Huddle, MD  AU:[id number] loss, tremors  Interval history:  Patient is here as a new consult for tremors of the left hand. Patient was just seen last month and evaluated for memory which may be normal cognitive aging with severe depression and anxiety however cannot rule out a neurodegenerative condition. She now has shaking in her left hand. It lasts hours. She is crying in the office today. Just in the left hand. Happens every few days. This is new. Happens when she is having memory lapses. She is alert, no alteration of awareness or loss of consciousness. She says it happened at work for the first time a month ago, no inciting event, she can't stop it, described as a whole arm shaking, lasted hours. The tremor changes directions. Hand and arm flapping motion all the way up the arm. Unknown triggers. She can work when this is going on but can't write. Only the left hand. Husband has been driving. It appears there is excessive stress, anxiety and depression ongoing. Dr. Inda Merlin placed her on Keppra 250mg  bid. They have not video taped it, I recommended that so I can see an event. Husband provides much information.   Interval history 11 /08/2016: Patient is here for follow-up of memory loss.  Kiarrah L Willisis a 61 y.o.femalehere as a referral from Dr. Val Eagle memory loss. Past medical history hypertension, hyperlipidemia, anxiety, dysthymia, seasonal allergies, degenerative joint disease, trochanteric bursitis, situational stress, migraines, vitamin D deficiency,depression, obesity, restless leg syndrome, hand arthritis, cervical radiculopathy at C5, renal cancer left status post left radical affecting the April 2016, total hysterectomy in November 2017, carpal tunnel on the right 2010. She still smokes cigarettes half to 1 pack a day with occasional alcohol.  She is here  with husband, she forgets things easy, she has to keep asking things over and over in the same day.  It is getting worse. She has depression, stress, and being forgetful. She goes to bed at different times, it will take an hour to go to sleep and she gets up 3x a night just because, no snoring, extremely fatigued all day, she wakes in the morning with headaches. Here with her husband who also provides information.  Reviewed MRI brain 04/2016: This MRI of the brain without contrast shows the following: 1. Mild generalized cortical atrophy. 2. Few scattered T2/FLAIR hyperintense foci in the hemispheres consistent with age appropriate minimal chronic white vessel ischemic change. 3. Single right frontal microhemorrhage. A single focus is unlikely to be clinically significant.Marland Kitchen  TGY:BWLSL L Willisis a 61 y.o.femalehere as a referral from Dr. Val Eagle memory loss. Past medical history hypertension, hyperlipidemia, anxiety, dysthymia, seasonal allergies, degenerative joint disease, trochanteric bursitis, situational stress, migraines, vitamin D deficiency,depression, obesity, restless leg syndrome, hand arthritis, cervical radiculopathy at C5, renal cancer left status post left radical affecting the April 2016, total hysterectomy in November 2017, carpal tunnel on the right 2010. She still smokes cigarettes half to 1 pack a day with occasional alcohol.  She forgets where she puts things or what she is going to say or what she is doing. Started after anesthesia in November. She also feels "off balance". When she was filling out the form this morning she could remember her zip code where she has lived 8-9 years. She has to sit and stare at the computer and can;t remember what button she is supposed to hit. She loses  train of thought when talking to someone. She says she forgot how to get to work, she has not been driving since then. She was driving down the highway and she thought she was  supposed to turn. Sometimes she is at a stop light and doesn't know where to go. She kept driving that day until she recognized areas and finally made it to work. No accidents in the home, She denies dizziness but when she is walking she feels her balance is "off", no tripping or falling she can;t describe it feels "off". She puts her medications in pill containers and some days forgets to take the medications which is new. She is not sleeping well, her pcp suggested trazodone and a form of melatonin. She tosses and turns a lot. She can sleep 6 hours some night or less. Socially nothing has changed. Not sad or depressed. Tomorrow she is having a PET scan for lymph nodes found so she has this on her plate. She repeats questions and stories. Husband pays the bills. Husband is here and provides all information. She forgets her dogs names. No difficulty remembering peopl close to her.Father's with parkinson's disease. No alzheimer's dementia. No other medication changes. Memory changes stable not worsening.Husband provides much information.No other focal neurologic deficits, associated symptoms, inciting events or modifiable factors.  Reviewed notes, labs and imaging from outside physicians, which showed:  Reviewed14 pages ofprimary care notes, pertinent history recorded includes:Patient complains of insomnia and also memory loss. Patient had surgery for uterine cancer several months ago and her memory has been worse. Also insomnia has been a problem. She is obese. She also has restless legs. Her primary caretaker physicians referred her to neurology for memory loss possibly due to medication or postanesthesia.  BMP April 2016 was unremarkable except for elevated creatinine 1.34,BUN 17 and GFR 42,CBC showed white blood cells 11.8, otherwise normal.  Review of systems includes positive weight loss, fatigue, memory loss, confusion, headache, insomnia, dizziness, anxiety, depression, change in appetite,  not enough sleep otherwise negative.      Social History   Socioeconomic History  . Marital status: Married    Spouse name: Gershon Mussel  . Number of children: 3  . Years of education: 50  . Highest education level: Not on file  Social Needs  . Financial resource strain: Not on file  . Food insecurity - worry: Not on file  . Food insecurity - inability: Not on file  . Transportation needs - medical: Not on file  . Transportation needs - non-medical: Not on file  Occupational History  . Occupation: nurse  Tobacco Use  . Smoking status: Former Smoker    Packs/day: 0.50    Last attempt to quit: 05/17/2010    Years since quitting: 6.7  . Smokeless tobacco: Never Used  Substance and Sexual Activity  . Alcohol use: Yes    Comment: rare  . Drug use: No  . Sexual activity: Not on file  Other Topics Concern  . Not on file  Social History Narrative   Lives with husband   Caffeine use: 1 cup coffee per day   Left handed     Family History  Problem Relation Age of Onset  . Cancer Mother   . Heart attack Father   . Diabetes Other   . Dementia Neg Hx     Past Medical History:  Diagnosis Date  . Allergic rhinitis   . Anxiety state   . Arthritis, degenerative   . Atypical migraine   .  Awareness of heartbeats   . Cancer (HCC)    Left Kidney  . Cervical radiculitis   . CFIDS (chronic fatigue and immune dysfunction syndrome) ([place])   . Depression, major, single episode, in partial remission ([place])   . Depression, neurotic   . Essential (primary) hypertension   . Heart murmur   . HLD (hyperlipidemia)   . Inflammation of hand joint   . Premature complex, ventricular   . Renal disorder   . Renal mass, left   . Restless leg   . Shortness of breath dyspnea    occasional  . 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 Tremor    L arm  . Vitamin D deficiency     Past Surgical History:  Procedure Laterality Date  . c sections x 3     . CARPAL TUNNEL RELEASE      rt hand  . LAPAROSCOPIC NEPHRECTOMY Left 05/21/2014   Procedure: LEFT RADICAL LAPAROSCOPIC NEPHRECTOMY;  Surgeon: Ardis Hughs, MD;  Location: [facility];  Service: Urology;  Laterality: Left;  Marland Kitchen MANDIBLE SURGERY    . NEPHRECTOMY TRANSPLANTED ORGAN     Left kidney removed    Current Outpatient Medications  Medication Sig Dispense Refill  . Acetaminophen (TYLENOL) 325 MG CAPS Take 1 capsule by mouth as needed.    Marland Kitchen buPROPion (WELLBUTRIN SR) 150 MG 12 hr tablet Take 150 mg by mouth 2 (two) times daily.    . busPIRone (BUSPAR) 15 MG tablet Take 15 mg by mouth 2 (two) times daily.    . Cholecalciferol (VITAMIN D3) 10000 UNITS capsule Take 20,000 Units by mouth 3 (three) times a week. Mon, Wed, Fri    . CRANBERRY PO Take 1 Dose by mouth daily. 4, 2000mg  daily    . docusate sodium (COLACE) 100 MG capsule Take 1 capsule (100 mg total) by mouth 2 (two) times daily as needed (take to keep stool soft.). 60 capsule 0  . hydrochlorothiazide (HYDRODIURIL) 25 MG tablet Take 25 mg by mouth daily.    . irbesartan (AVAPRO) 300 MG tablet Take 300 mg daily by mouth.     . levETIRAcetam (KEPPRA) 250 MG tablet Take 250 mg by mouth 2 (two) times daily.    . Multiple Vitamin (MULTIVITAMIN WITH MINERALS) TABS tablet Take 1 tablet by mouth daily.    Marland Kitchen OVER THE COUNTER MEDICATION Take 1 tablet by mouth 2 (two) times daily. Focus factor    . Probiotic Product (PROBIOTIC PO) Take 1 Dose by mouth daily. Takes  2 gummies daily per patient    . rosuvastatin (CRESTOR) 10 MG tablet Take 10 mg by mouth See admin instructions. Patient reports she takes 4 days a week    . sertraline (ZOLOFT) 100 MG tablet Take 150 mg by mouth daily.     . traMADol (ULTRAM) 50 MG tablet Take 50 mg by mouth as needed. Takes 1-2 tablets daily at bedtime as needed    . traZODone (DESYREL) 50 MG tablet Take 50 mg at bedtime by mouth.     No current facility-administered medications for this visit.     Allergies as of 01/31/2017 - Review  Complete 01/31/2017  Allergen Reaction Noted  . Penicillins Hives 10/20/2013  . Aspirin  03/15/2016  . Nsaids  03/15/2016  . Requip [ropinirole hcl]  10/20/2013    Vitals: Ht 5' 5.5" (1.664 m)   Wt 157 lb 12.8 oz (71.6 kg)   BMI 25.86 kg/m  Last Weight:  Wt Readings  from Last 1 Encounters:  01/31/17 157 lb 12.8 oz (71.6 kg)   Last Height:   Ht Readings from Last 1 Encounters:  01/31/17 5' 5.5" (1.664 m)   Physical exam: Exam: Gen: crying, distressed, conversant                   CV: RRR, no MRG. No Carotid Bruits. No peripheral edema, warm, nontender Eyes: Conjunctivae clear without exudates or hemorrhage  Neuro: Detailed Neurologic Exam  Speech:    Speech is normal; fluent and spontaneous with normal comprehension.  Cognition:    The patient is oriented to person, place, and time;     recent and remote memory intact;     language fluent;     normal attention, concentration,     fund of knowledge Cranial Nerves:    The pupils are equal, round, and reactive to light. The fundi are normal and spontaneous venous pulsations are present. Visual fields are full to finger confrontation. Extraocular movements are intact. Trigeminal sensation is intact and the muscles of mastication are normal. The face is symmetric. The palate elevates in the midline. Hearing intact. Voice is normal. Shoulder shrug is normal. The tongue has normal motion without fasciculations.   Coordination:    Normal finger to nose and heel to shin. Normal rapid alternating movements.   Gait:    Heel-toe and tandem gait are normal.   Motor Observation:    No asymmetry, no atrophy, and no involuntary movements noted. Tone:    Normal muscle tone.    Posture:    Posture is normal. normal erect    Strength:    Strength is V/V in the upper and lower limbs.      Sensation: intact to LT     Reflex Exam:  DTR's:    Deep tendon reflexes in the upper and lower extremities are normal bilaterally.     Toes:    The toes are downgoing bilaterally.   Clonus:    Clonus is absent.   Assessment/Plan:61 year old with reported progressive cognitive changes after anesthesia. MMSE 28/30 stable. Here for new complaint of left arm shaking that lasts for hours.  - Asked them to videotape event - No driving at this time - routine EEG and then extended EEG for 72 hours - Dr. Inda Merlin placed her on Keppra due to possibility of seizure disorder, unclear etiology could this be non-epileptic event in the setting of extreme stress -  Patient was just seen last month and evaluated for memory which may be normal cognitive aging with severe depression and anxiety however cannot rule out a neurodegenerative condition. - Has appointment for formal neurocognitive testing but not until April. In the meantime will order FDG PET Scan - Recommend psychiatry and counseling - Her Epworth Sleepiness Scale was 10, she wakes multiple times throughout the night, no snoring but does feel fatigued and does not get restful sleep recommended sleep study. Patient declined.   Orders Placed This Encounter  Procedures  . NM PET Metabolic Brain  . EEG      Sarina Ill, MD  [facility] [address] [place],  [address]  Phone [phone] Fax [phone]

## 2017-01-31 NOTE — Telephone Encounter (Signed)
Noted that patient has an appt today 01/31/2017 @ 3:30 pm. I returned the patient's call and when she answered she stated that they got it taken care of and she has an appt today. I confirmed that it is at 3:30. She had no questions or concerns during the call.

## 2017-02-14 ENCOUNTER — Ambulatory Visit (INDEPENDENT_AMBULATORY_CARE_PROVIDER_SITE_OTHER): Payer: No Typology Code available for payment source | Admitting: Neurology

## 2017-02-14 ENCOUNTER — Telehealth: Payer: Self-pay | Admitting: *Deleted

## 2017-02-14 DIAGNOSIS — R410 Disorientation, unspecified: Secondary | ICD-10-CM | POA: Diagnosis not present

## 2017-02-14 DIAGNOSIS — R251 Tremor, unspecified: Secondary | ICD-10-CM

## 2017-02-14 DIAGNOSIS — R413 Other amnesia: Secondary | ICD-10-CM

## 2017-02-14 NOTE — Telephone Encounter (Signed)
Patient has not heard from anyone regarding scheduling the PET Scan.  When might she hear about this?  Please call.  If you cant reach her at [phone] try [phone]

## 2017-02-15 NOTE — Procedures (Signed)
   HISTORY: 62 years old female complains of memory loss.  TECHNIQUE:  16 channel EEG was performed based on standard 10-16 international system. One channel was dedicated to EKG, which has demonstrates normal sinus rhythm of 60 beats per minutes.  Upon awakening, the posterior background activity was well-developed, has mixed alpha and beta range activities, reactive to eye opening and closure.  There was no evidence of epileptiform discharge. There are frequent muscle artifact.  Photic stimulation was performed, which induced a symmetric photic driving.  Hyperventilation was performed, there was no abnormality elicit.  No sleep was achieved.  CONCLUSION: This is a  normal awake EEG.  There is no electrodiagnostic evidence of epileptiform discharge.  Marcial Pacas, M.D. Ph.D.  [facility] [place],  [address] Phone: [phone] Fax:      [phone]

## 2017-02-16 ENCOUNTER — Telehealth: Payer: Self-pay | Admitting: Neurology

## 2017-02-16 NOTE — Telephone Encounter (Signed)
Please call patient and discuss EEG. It was normal. Did she have any of her arm shaking during the EEG? If so, no further testing needed. If she did not have any symptoms during the EEG we need an extended EEG at home and a neurovative diagnostics form filled out for her please. Thanks

## 2017-02-19 NOTE — Telephone Encounter (Signed)
Called pt and LVM (ok per DPR) informing her as discussed earlier her EEG was negative and she does not need further testing for that per Dr. Jaynee Eagles. I advised patient to call the office with any questions that she may have but that a return call is not required. Office closed now but reopening tomorrow @ 8 AM.

## 2017-02-19 NOTE — Telephone Encounter (Signed)
Called and spoke with patient. I informed her that her EEG results are normal. I asked her if her arm was shaking during the EEG and she said if so it was probably slight. I asked her if she was still having problems with the shaking & she said the it has slowed down. She is mostly concerned about her memory, saying she has trouble doing her job and has to write things down. I told her I would pass message along to MD and f/u if any further testing needed related to the EEG. She verbalized understanding and appreciation.

## 2017-02-19 NOTE — Telephone Encounter (Signed)
No thanks  

## 2017-02-19 NOTE — Telephone Encounter (Signed)
I have called Krista Hodges and Left message asking her to please call  patient and schedule schedule  Her PET Scan . [phone].

## 2017-03-13 ENCOUNTER — Ambulatory Visit
Admission: RE | Admit: 2017-03-13 | Discharge: 2017-03-13 | Disposition: A | Discharge status: Home / Self Care | Payer: No Typology Code available for payment source | Source: Ambulatory Visit | Attending: Surgery | Admitting: Surgery

## 2017-03-13 DIAGNOSIS — E042 Nontoxic multinodular goiter: Secondary | ICD-10-CM

## 2017-03-25 ENCOUNTER — Other Ambulatory Visit: Payer: Self-pay | Admitting: Surgery

## 2017-03-25 DIAGNOSIS — D440 Neoplasm of uncertain behavior of thyroid gland: Secondary | ICD-10-CM

## 2017-04-02 ENCOUNTER — Other Ambulatory Visit: Payer: No Typology Code available for payment source

## 2017-04-02 ENCOUNTER — Ambulatory Visit
Admission: RE | Admit: 2017-04-02 | Discharge: 2017-04-02 | Disposition: A | Discharge status: Home / Self Care | Payer: No Typology Code available for payment source | Source: Ambulatory Visit | Attending: Surgery | Admitting: Surgery

## 2017-04-02 ENCOUNTER — Other Ambulatory Visit (HOSPITAL_COMMUNITY)
Admission: RE | Admit: 2017-04-02 | Discharge: 2017-04-02 | Disposition: A | Discharge status: Home / Self Care | Payer: No Typology Code available for payment source | Source: Ambulatory Visit | Attending: General Surgery | Admitting: General Surgery

## 2017-04-02 DIAGNOSIS — E041 Nontoxic single thyroid nodule: Secondary | ICD-10-CM | POA: Diagnosis not present

## 2017-04-02 DIAGNOSIS — D440 Neoplasm of uncertain behavior of thyroid gland: Secondary | ICD-10-CM

## 2017-04-18 ENCOUNTER — Ambulatory Visit: Payer: Self-pay | Admitting: Surgery

## 2017-05-01 ENCOUNTER — Encounter (HOSPITAL_COMMUNITY): Payer: Self-pay

## 2017-05-01 NOTE — Patient Instructions (Signed)
Your procedure is scheduled on: Thursday, May 09, 2017   Surgery Time:  8:30AM-10:30AM   Report to [place]  Entrance    Report to admitting at 6:30 AM   Call this number if you have problems the morning of surgery [phone]   Do not eat food or drink liquids :After Midnight.   Do NOT smoke after Midnight   Take these medicines the morning of surgery with A SIP OF WATER: Buproprion, Buspirone, Sertraline              You may not have any metal on your body including hair pins, jewelry, and body piercings             Do not wear make-up, lotions, powders, perfumes/cologne, or deodorant             Do not wear nail polish.  Do not shave  48 hours prior to surgery.                Do not bring valuables to the hospital. [place].   Contacts, dentures or bridgework may not be worn into surgery.   Leave suitcase in the car. After surgery it may be brought to your room.   Special Instructions: Bring a copy of your healthcare power of attorney and living will documents         the day of surgery if you haven't scanned them in before.              Please read over the following fact sheets you were given:  [facility] - Preparing for Surgery Before surgery, you can play an important role.  Because skin is not sterile, your skin needs to be as free of germs as possible.  You can reduce the number of germs on your skin by washing with CHG (chlorahexidine gluconate) soap before surgery.  CHG is an antiseptic cleaner which kills germs and bonds with the skin to continue killing germs even after washing. Please DO NOT use if you have an allergy to CHG or antibacterial soaps.  If your skin becomes reddened/irritated stop using the CHG and inform your nurse when you arrive at Short Stay. Do not shave (including legs and underarms) for at least 48 hours prior to the first CHG shower.  You may shave your  face/neck.  Please follow these instructions carefully:  1.  Shower with CHG Soap the night before surgery and the  morning of surgery.  2.  If you choose to wash your hair, wash your hair first as usual with your normal  shampoo.  3.  After you shampoo, rinse your hair and body thoroughly to remove the shampoo.                             4.  Use CHG as you would any other liquid soap.  You can apply chg directly to the skin and wash.  Gently with a scrungie or clean washcloth.  5.  Apply the CHG Soap to your body ONLY FROM THE NECK DOWN.   Do   not use on face/ open                           Wound or open sores. Avoid contact with  eyes, ears mouth and   genitals (private parts).                       Wash face,  Genitals (private parts) with your normal soap.             6.  Wash thoroughly, paying special attention to the area where your    surgery  will be performed.  7.  Thoroughly rinse your body with warm water from the neck down.  8.  DO NOT shower/wash with your normal soap after using and rinsing off the CHG Soap.                9.  Pat yourself dry with a clean towel.            10.  Wear clean pajamas.            11.  Place clean sheets on your bed the night of your first shower and do not  sleep with pets. Day of Surgery : Do not apply any lotions/deodorants the morning of surgery.  Please wear clean clothes to the hospital/surgery center.  FAILURE TO FOLLOW THESE INSTRUCTIONS MAY RESULT IN THE CANCELLATION OF YOUR SURGERY  PATIENT SIGNATURE_________________________________  NURSE SIGNATURE__________________________________  ________________________________________________________________________

## 2017-05-01 NOTE — Pre-Procedure Instructions (Signed)
EEG 02/14/17 in epic.

## 2017-05-02 ENCOUNTER — Other Ambulatory Visit: Payer: Self-pay

## 2017-05-02 ENCOUNTER — Encounter (HOSPITAL_COMMUNITY): Payer: Self-pay

## 2017-05-02 ENCOUNTER — Encounter (HOSPITAL_COMMUNITY)
Admission: RE | Admit: 2017-05-02 | Discharge: 2017-05-02 | Disposition: A | Discharge status: Home / Self Care | Payer: PRIVATE HEALTH INSURANCE | Source: Ambulatory Visit | Attending: Surgery | Admitting: Surgery

## 2017-05-02 ENCOUNTER — Ambulatory Visit (HOSPITAL_COMMUNITY)
Admission: RE | Admit: 2017-05-02 | Discharge: 2017-05-02 | Disposition: A | Discharge status: Home / Self Care | Payer: PRIVATE HEALTH INSURANCE | Source: Ambulatory Visit | Attending: Anesthesiology | Admitting: Anesthesiology

## 2017-05-02 DIAGNOSIS — Z01810 Encounter for preprocedural cardiovascular examination: Secondary | ICD-10-CM | POA: Diagnosis present

## 2017-05-02 DIAGNOSIS — Z01818 Encounter for other preprocedural examination: Secondary | ICD-10-CM | POA: Insufficient documentation

## 2017-05-02 DIAGNOSIS — Z01812 Encounter for preprocedural laboratory examination: Secondary | ICD-10-CM | POA: Insufficient documentation

## 2017-05-02 DIAGNOSIS — C73 Malignant neoplasm of thyroid gland: Secondary | ICD-10-CM | POA: Diagnosis not present

## 2017-05-02 HISTORY — DX: Malignant neoplasm of left kidney, except renal pelvis: C64.2

## 2017-05-02 HISTORY — DX: Other specified abnormal findings of blood chemistry: R79.89

## 2017-05-02 HISTORY — DX: Malignant neoplasm of endometrium: C54.1

## 2017-05-02 HISTORY — DX: Gastric ulcer, unspecified as acute or chronic, without hemorrhage or perforation: K25.9

## 2017-05-02 HISTORY — DX: Other amnesia: R41.3

## 2017-05-02 LAB — BASIC METABOLIC PANEL
ANION GAP: 8 (ref 5–15)
BUN: 21 mg/dL — ABNORMAL HIGH (ref 6–20)
CHLORIDE: 105 mmol/L (ref 101–111)
CO2: 28 mmol/L (ref 22–32)
Calcium: 10.5 mg/dL — ABNORMAL HIGH (ref 8.9–10.3)
Creatinine, Ser: 1.34 mg/dL — ABNORMAL HIGH (ref 0.44–1.00)
GFR calc non Af Amer: 41 mL/min — ABNORMAL LOW (ref >60)
GFR, EST AFRICAN AMERICAN: 48 mL/min — AB (ref >60)
Glucose, Bld: 98 mg/dL (ref 65–99)
POTASSIUM: 4.3 mmol/L (ref 3.5–5.1)
SODIUM: 141 mmol/L (ref 135–145)

## 2017-05-02 LAB — CBC
HCT: 41.9 % (ref 36.0–46.0)
Hemoglobin: 13.7 g/dL (ref 12.0–15.0)
MCH: 32 pg (ref 26.0–34.0)
MCHC: 32.7 g/dL (ref 30.0–36.0)
MCV: 97.9 fL (ref 78.0–100.0)
Platelets: 233 10*3/uL (ref 150–400)
RBC: 4.28 MIL/uL (ref 3.87–5.11)
RDW: 12.8 % (ref 11.5–15.5)
WBC: 4.9 10*3/uL (ref 4.0–10.5)

## 2017-05-02 NOTE — Progress Notes (Signed)
cxr done 05-02-17 routed to Dr. Harlow Asa via epic

## 2017-05-03 NOTE — Progress Notes (Signed)
Final ekg done 05-02-17 in epic 

## 2017-05-05 ENCOUNTER — Encounter (HOSPITAL_COMMUNITY): Payer: Self-pay | Admitting: Surgery

## 2017-05-05 DIAGNOSIS — C73 Malignant neoplasm of thyroid gland: Secondary | ICD-10-CM | POA: Diagnosis present

## 2017-05-05 NOTE — H&P (Signed)
General Surgery [facility]  [name] DOB: [birth year] Married / Language: English / Race: White Female   History of Present Illness  The patient is a 62 year old female who presents with thyroid cancer.  CC: papillary thyroid carcinoma  Patient returns for follow-up of bilateral thyroid nodules. Patient had previously undergone a right fine-needle aspiration biopsy which showed a benign hyperplastic thyroid nodule. Patient has a nodule in the left thyroid lobe which fine-needle aspiration showed a follicular lesion of undetermined significance. As follow-up, she underwent a thyroid ultrasound which identified a suspicious 1 cm nodule in the left side of the thyroid isthmus. Biopsy was performed and showed papillary thyroid carcinoma. Patient now returns to discuss these findings and to discuss plans for surgical resection. Patient is not on thyroid hormone. Her most recent TSH level is normal at 1.67. She is accompanied today by her husband.   Problem List/Past Medical NEOPLASM OF UNCERTAIN BEHAVIOR OF THYROID GLAND (D44.0)  PAPILLARY THYROID CARCINOMA (C73)  MULTIPLE THYROID NODULES (E04.2)   Past Surgical History  Cesarean Section - Multiple  Colon Polyp Removal - Colonoscopy  Hysterectomy (due to cancer) - Complete  Nephrectomy  Left. Oral Surgery  Sentinel Lymph Node Biopsy   Diagnostic Studies History  Colonoscopy  within last year Mammogram  1-3 years ago  Allergies Penicillins  Rash.  Medication History Bupropion HCl (150MG  Tablet ER, Oral) Active. Cetirizine HCl (10MG  Capsule, Oral) Active. Xanax (0.25MG  Tablet, Oral) Active. Avapro (300MG  Tablet, Oral) Active. HydroCHLOROthiazide (25MG  Tablet, Oral) Active. Pramipexole Dihydrochloride (0.5MG  Tablet, Oral) Active. TraZODone HCl (50MG  Tablet, Oral) Active. Vitamin D3 (10000UNIT Capsule, Oral) Active. BuSpar (15MG  Tablet, Oral) Active. Cranberry Plus Vitamin  C (4200-20-3MG -MG-UNIT Capsule, Oral) Active. Medications Reconciled  Social History Alcohol use  Occasional alcohol use. Caffeine use  Coffee, Tea. No drug use  Tobacco use  Former smoker.  Family History Alcohol Abuse  Family Members In General. Arthritis  Son. Breast Cancer  Sister. Cervical Cancer  Daughter. Colon Cancer  Mother. Diabetes Mellitus  Family Members In General. Heart disease in female family member before age 77  Heart disease in female family member before age 32  Hypertension  Father, Son. Malignant Neoplasm Of Pancreas  Family Members In General. Migraine Headache  Daughter, Mother. Respiratory Condition  Mother.  Pregnancy / Birth History  Age at menarche  58 years. Age of menopause  51-55 Contraceptive History  Oral contraceptives. Gravida  3 Maternal age  30-25 Para  3  Other Problems  Cervical Cancer  Chronic Renal Failure Syndrome  Depression  Heart murmur  High blood pressure  Hypercholesterolemia  Oophorectomy  Bilateral. Pancreatitis  Thyroid Cancer  Thyroid Disease   Vitals  Weight: 161.4 lb Height: 66in Body Surface Area: 1.83 m Body Mass Index: 26.05 kg/m  Temp.: 97.60F(Temporal)  Pulse: 64 (Regular)  BP: 116/72 (Sitting, Left Arm, Standard)  Physical Exam  See vital signs recorded above  GENERAL APPEARANCE Development: normal Nutritional status: normal Gross deformities: none  SKIN Rash, lesions, ulcers: none Induration, erythema: none Nodules: none palpable  EYES Conjunctiva and lids: normal Pupils: equal and reactive Iris: normal bilaterally  EARS, NOSE, MOUTH, THROAT External ears: no lesion or deformity External nose: no lesion or deformity Hearing: grossly normal Lips: no lesion or deformity Dentition: normal for age Oral mucosa: moist  NECK Symmetric: yes Trachea: midline Thyroid: Thyroid is normal in size. There is a small firm 1 cm mass in the left  side of the isthmus which  is palpable when compressed against the trachea. It is mobile. No other dominant or discrete nodules are palpable. There is no palpable lymphadenopathy.  CHEST Respiratory effort: normal Retraction or accessory muscle use: no Breath sounds: normal bilaterally Rales, rhonchi, wheeze: none  CARDIOVASCULAR Auscultation: regular rhythm, normal rate Murmurs: none Pulses: carotid and radial pulse 2+ palpable Lower extremity edema: none Lower extremity varicosities: none  MUSCULOSKELETAL Station and gait: normal Digits and nails: no clubbing or cyanosis Muscle strength: grossly normal all extremities Range of motion: grossly normal all extremities Deformity: none  LYMPHATIC Cervical: none palpable Supraclavicular: none palpable  PSYCHIATRIC Oriented to person, place, and time: yes Mood and affect: normal for situation Judgment and insight: appropriate for situation   Assessment & Plan  PAPILLARY THYROID CARCINOMA (C73) MULTIPLE THYROID NODULES (E04.2)  Patient returns today at my request accompanied by her husband. We reviewed her recent pathology report following fine-needle aspiration biopsy of a 1 cm nodule in the thyroid isthmus. This shows papillary thyroid carcinoma. Patient is provided with written literature on thyroid surgery to review at home.  We discussed options for surgical management. We discussed partial thyroidectomy versus total thyroidectomy. We discussed sampling of lymph nodes. I have recommended total thyroidectomy with limited lymph node dissection. We discussed the procedure, the surgical incision, the hospital stay to be expected, in the postoperative recovery. We discussed the potential need for radioactive iodine treatment. We discussed the need for lifelong thyroid hormone replacement. Patient understands and wishes to proceed with surgery in the near future.  The risks and benefits of the procedure have been discussed  at length with the patient. The patient understands the proposed procedure, potential alternative treatments, and the course of recovery to be expected. All of the patient's questions have been answered at this time. The patient wishes to proceed with surgery.  Armandina Gemma, [facility] Office: [phone]

## 2017-05-09 ENCOUNTER — Ambulatory Visit (HOSPITAL_COMMUNITY): Payer: No Typology Code available for payment source | Admitting: Anesthesiology

## 2017-05-09 ENCOUNTER — Other Ambulatory Visit: Payer: Self-pay

## 2017-05-09 ENCOUNTER — Observation Stay (HOSPITAL_COMMUNITY)
Admission: RE | Admit: 2017-05-09 | Discharge: 2017-05-10 | Disposition: A | Discharge status: Home / Self Care | Payer: No Typology Code available for payment source | Source: Ambulatory Visit | Attending: Surgery | Admitting: Surgery

## 2017-05-09 ENCOUNTER — Encounter (HOSPITAL_COMMUNITY)
Admission: RE | Disposition: A | Discharge status: Home / Self Care | Payer: Self-pay | Source: Ambulatory Visit | Attending: Surgery

## 2017-05-09 ENCOUNTER — Encounter (HOSPITAL_COMMUNITY): Payer: Self-pay | Admitting: Anesthesiology

## 2017-05-09 DIAGNOSIS — Z79899 Other long term (current) drug therapy: Secondary | ICD-10-CM | POA: Insufficient documentation

## 2017-05-09 DIAGNOSIS — C73 Malignant neoplasm of thyroid gland: Secondary | ICD-10-CM | POA: Diagnosis present

## 2017-05-09 DIAGNOSIS — E7800 Pure hypercholesterolemia, unspecified: Secondary | ICD-10-CM | POA: Insufficient documentation

## 2017-05-09 DIAGNOSIS — Z87891 Personal history of nicotine dependence: Secondary | ICD-10-CM | POA: Diagnosis not present

## 2017-05-09 DIAGNOSIS — F329 Major depressive disorder, single episode, unspecified: Secondary | ICD-10-CM | POA: Insufficient documentation

## 2017-05-09 DIAGNOSIS — N189 Chronic kidney disease, unspecified: Secondary | ICD-10-CM | POA: Diagnosis not present

## 2017-05-09 DIAGNOSIS — I129 Hypertensive chronic kidney disease with stage 1 through stage 4 chronic kidney disease, or unspecified chronic kidney disease: Secondary | ICD-10-CM | POA: Diagnosis not present

## 2017-05-09 HISTORY — PX: THYROIDECTOMY: SHX17

## 2017-05-09 SURGERY — THYROIDECTOMY
Anesthesia: General | Site: Neck

## 2017-05-09 MED ORDER — PHENYLEPHRINE 40 MCG/ML (10ML) SYRINGE FOR IV PUSH (FOR BLOOD PRESSURE SUPPORT)
PREFILLED_SYRINGE | INTRAVENOUS | Status: DC | PRN
  Administered 2017-05-09 (×2): 80 ug via INTRAVENOUS

## 2017-05-09 MED ORDER — PROMETHAZINE HCL 25 MG/ML IJ SOLN: 6.2500 mg | INTRAMUSCULAR | Status: DC | PRN

## 2017-05-09 MED ORDER — BUSPIRONE HCL 5 MG PO TABS
15.0000 mg | ORAL_TABLET | Freq: Two times a day (BID) | ORAL | Status: DC
  Administered 2017-05-10 (×2): 15 mg via ORAL
  Filled 2017-05-09 (×2): qty 3

## 2017-05-09 MED ORDER — IRBESARTAN 150 MG PO TABS
300.0000 mg | ORAL_TABLET | Freq: Every day | ORAL | Status: DC
  Administered 2017-05-09 – 2017-05-10 (×2): 300 mg via ORAL
  Filled 2017-05-09 (×2): qty 2

## 2017-05-09 MED ORDER — LIDOCAINE 2% (20 MG/ML) 5 ML SYRINGE
INTRAMUSCULAR | Status: DC | PRN
  Administered 2017-05-09: 100 mg via INTRAVENOUS

## 2017-05-09 MED ORDER — CIPROFLOXACIN IN D5W 400 MG/200ML IV SOLN
400.0000 mg | INTRAVENOUS | Status: AC
  Administered 2017-05-09: 400 mg via INTRAVENOUS
  Filled 2017-05-09: qty 200

## 2017-05-09 MED ORDER — KCL IN DEXTROSE-NACL 20-5-0.45 MEQ/L-%-% IV SOLN
INTRAVENOUS | Status: DC
  Administered 2017-05-09 – 2017-05-10 (×2): via INTRAVENOUS
  Filled 2017-05-09 (×2): qty 1000

## 2017-05-09 MED ORDER — CALCIUM CARBONATE 1250 (500 CA) MG PO TABS: 2.0000 | ORAL_TABLET | Freq: Three times a day (TID) | ORAL | Status: DC

## 2017-05-09 MED ORDER — BUPROPION HCL 100 MG PO TABS
200.0000 mg | ORAL_TABLET | Freq: Every day | ORAL | Status: DC
  Administered 2017-05-10: 10:00:00 200 mg via ORAL
  Filled 2017-05-09: qty 2

## 2017-05-09 MED ORDER — GLYCOPYRROLATE 0.2 MG/ML IV SOSY
PREFILLED_SYRINGE | INTRAVENOUS | Status: AC
  Filled 2017-05-09: qty 5

## 2017-05-09 MED ORDER — HYDROCHLOROTHIAZIDE 25 MG PO TABS
25.0000 mg | ORAL_TABLET | Freq: Every day | ORAL | Status: DC
  Administered 2017-05-09 – 2017-05-10 (×2): 25 mg via ORAL
  Filled 2017-05-09 (×2): qty 1

## 2017-05-09 MED ORDER — GLYCOPYRROLATE 0.2 MG/ML IV SOSY
PREFILLED_SYRINGE | INTRAVENOUS | Status: DC | PRN
  Administered 2017-05-09: .2 mg via INTRAVENOUS

## 2017-05-09 MED ORDER — ACETAMINOPHEN 325 MG PO TABS
650.0000 mg | ORAL_TABLET | Freq: Four times a day (QID) | ORAL | Status: DC | PRN
  Administered 2017-05-10: 650 mg via ORAL
  Filled 2017-05-09: qty 2

## 2017-05-09 MED ORDER — SUGAMMADEX SODIUM 200 MG/2ML IV SOLN
INTRAVENOUS | Status: AC
  Filled 2017-05-09: qty 12

## 2017-05-09 MED ORDER — FENTANYL CITRATE (PF) 100 MCG/2ML IJ SOLN
INTRAMUSCULAR | Status: AC
  Filled 2017-05-09: qty 2

## 2017-05-09 MED ORDER — FENTANYL CITRATE (PF) 100 MCG/2ML IJ SOLN
INTRAMUSCULAR | Status: DC | PRN
  Administered 2017-05-09: 100 ug via INTRAVENOUS
  Administered 2017-05-09 (×2): 25 ug via INTRAVENOUS
  Administered 2017-05-09: 50 ug via INTRAVENOUS

## 2017-05-09 MED ORDER — MIDAZOLAM HCL 2 MG/2ML IJ SOLN
INTRAMUSCULAR | Status: AC
  Filled 2017-05-09: qty 2

## 2017-05-09 MED ORDER — EPHEDRINE 5 MG/ML INJ
INTRAVENOUS | Status: AC
  Filled 2017-05-09: qty 10

## 2017-05-09 MED ORDER — SUGAMMADEX SODIUM 500 MG/5ML IV SOLN
INTRAVENOUS | Status: AC
  Filled 2017-05-09: qty 5

## 2017-05-09 MED ORDER — SUGAMMADEX SODIUM 200 MG/2ML IV SOLN
INTRAVENOUS | Status: DC | PRN
  Administered 2017-05-09: 200 mg via INTRAVENOUS

## 2017-05-09 MED ORDER — HYDROCODONE-ACETAMINOPHEN 5-325 MG PO TABS: 1.0000 | ORAL_TABLET | ORAL | Status: DC | PRN

## 2017-05-09 MED ORDER — ROCURONIUM BROMIDE 10 MG/ML (PF) SYRINGE
PREFILLED_SYRINGE | INTRAVENOUS | Status: DC | PRN
  Administered 2017-05-09: 50 mg via INTRAVENOUS

## 2017-05-09 MED ORDER — ONDANSETRON 4 MG PO TBDP: 4.0000 mg | ORAL_TABLET | Freq: Four times a day (QID) | ORAL | Status: DC | PRN

## 2017-05-09 MED ORDER — PROPOFOL 10 MG/ML IV BOLUS
INTRAVENOUS | Status: DC | PRN
  Administered 2017-05-09: 150 mg via INTRAVENOUS

## 2017-05-09 MED ORDER — LACTATED RINGERS IV SOLN
INTRAVENOUS | Status: DC
  Administered 2017-05-09: 07:00:00 via INTRAVENOUS
  Administered 2017-05-09: 1000 mL via INTRAVENOUS

## 2017-05-09 MED ORDER — HYDROMORPHONE HCL 1 MG/ML IJ SOLN
INTRAMUSCULAR | Status: AC
  Filled 2017-05-09: qty 1

## 2017-05-09 MED ORDER — SERTRALINE HCL 100 MG PO TABS
100.0000 mg | ORAL_TABLET | Freq: Every day | ORAL | Status: DC
  Administered 2017-05-10: 10:00:00 100 mg via ORAL
  Filled 2017-05-09: qty 1

## 2017-05-09 MED ORDER — HYDROMORPHONE HCL 1 MG/ML IJ SOLN
1.0000 mg | INTRAMUSCULAR | Status: DC | PRN
  Administered 2017-05-09 – 2017-05-10 (×3): 1 mg via INTRAVENOUS
  Filled 2017-05-09 (×3): qty 1

## 2017-05-09 MED ORDER — ESMOLOL HCL 100 MG/10ML IV SOLN
INTRAVENOUS | Status: DC | PRN
  Administered 2017-05-09 (×5): 10 mg via INTRAVENOUS

## 2017-05-09 MED ORDER — TRAMADOL HCL 50 MG PO TABS: 50.0000 mg | ORAL_TABLET | Freq: Four times a day (QID) | ORAL | Status: DC | PRN

## 2017-05-09 MED ORDER — ONDANSETRON HCL 4 MG/2ML IJ SOLN: 4.0000 mg | Freq: Four times a day (QID) | INTRAMUSCULAR | Status: DC | PRN

## 2017-05-09 MED ORDER — CHLORHEXIDINE GLUCONATE CLOTH 2 % EX PADS: 6.0000 | MEDICATED_PAD | Freq: Once | CUTANEOUS | Status: DC

## 2017-05-09 MED ORDER — HYDROMORPHONE HCL 1 MG/ML IJ SOLN
0.2500 mg | INTRAMUSCULAR | Status: DC | PRN
  Administered 2017-05-09 (×2): 0.5 mg via INTRAVENOUS

## 2017-05-09 MED ORDER — EPHEDRINE SULFATE-NACL 50-0.9 MG/10ML-% IV SOSY
PREFILLED_SYRINGE | INTRAVENOUS | Status: DC | PRN
  Administered 2017-05-09: 10 mg via INTRAVENOUS
  Administered 2017-05-09: 5 mg via INTRAVENOUS
  Administered 2017-05-09: 10 mg via INTRAVENOUS

## 2017-05-09 MED ORDER — ENSURE ENLIVE PO LIQD
237.0000 mL | Freq: Two times a day (BID) | ORAL | Status: DC
  Administered 2017-05-09 – 2017-05-10 (×2): 237 mL via ORAL

## 2017-05-09 MED ORDER — PHENYLEPHRINE 40 MCG/ML (10ML) SYRINGE FOR IV PUSH (FOR BLOOD PRESSURE SUPPORT)
PREFILLED_SYRINGE | INTRAVENOUS | Status: AC
  Filled 2017-05-09: qty 10

## 2017-05-09 MED ORDER — ONDANSETRON HCL 4 MG/2ML IJ SOLN
INTRAMUSCULAR | Status: DC | PRN
  Administered 2017-05-09: 4 mg via INTRAVENOUS

## 2017-05-09 MED ORDER — PRAMIPEXOLE DIHYDROCHLORIDE 0.25 MG PO TABS
0.5000 mg | ORAL_TABLET | Freq: Every day | ORAL | Status: DC
  Administered 2017-05-10: 0.5 mg via ORAL
  Filled 2017-05-09: qty 2

## 2017-05-09 MED ORDER — CALCIUM CARBONATE 1250 (500 CA) MG PO TABS
2.0000 | ORAL_TABLET | Freq: Three times a day (TID) | ORAL | Status: DC
  Administered 2017-05-09 – 2017-05-10 (×2): 1000 mg via ORAL
  Filled 2017-05-09 (×2): qty 1

## 2017-05-09 MED ORDER — PROPOFOL 10 MG/ML IV BOLUS
INTRAVENOUS | Status: AC
  Filled 2017-05-09: qty 40

## 2017-05-09 MED ORDER — TRAZODONE HCL 50 MG PO TABS: 50.0000 mg | ORAL_TABLET | Freq: Every evening | ORAL | Status: DC | PRN

## 2017-05-09 MED ORDER — DEXAMETHASONE SODIUM PHOSPHATE 10 MG/ML IJ SOLN
INTRAMUSCULAR | Status: DC | PRN
  Administered 2017-05-09: 10 mg via INTRAVENOUS

## 2017-05-09 MED ORDER — 0.9 % SODIUM CHLORIDE (POUR BTL) OPTIME
TOPICAL | Status: DC | PRN
  Administered 2017-05-09: 1000 mL

## 2017-05-09 MED ORDER — ACETAMINOPHEN 650 MG RE SUPP: 650.0000 mg | Freq: Four times a day (QID) | RECTAL | Status: DC | PRN

## 2017-05-09 SURGICAL SUPPLY — 36 items
ATTRACTOMAT 16X20 MAGNETIC DRP (DRAPES) ×3 IMPLANT
BLADE SURG 15 STRL LF DISP TIS (BLADE) ×1 IMPLANT
BLADE SURG 15 STRL SS (BLADE) ×2
CHLORAPREP W/TINT 26ML (MISCELLANEOUS) ×3 IMPLANT
CLIP VESOCCLUDE MED 6/CT (CLIP) ×6 IMPLANT
CLIP VESOCCLUDE SM WIDE 6/CT (CLIP) ×9 IMPLANT
CLOSURE WOUND 1/2 X4 (GAUZE/BANDAGES/DRESSINGS) ×1
COVER SURGICAL LIGHT HANDLE (MISCELLANEOUS) ×3 IMPLANT
DISSECTOR ROUND CHERRY 3/8 STR (MISCELLANEOUS) IMPLANT
DRAPE LAPAROTOMY T 98X78 PEDS (DRAPES) ×3 IMPLANT
ELECT PENCIL ROCKER SW 15FT (MISCELLANEOUS) ×3 IMPLANT
ELECT REM PT RETURN 15FT ADLT (MISCELLANEOUS) ×3 IMPLANT
GAUZE SPONGE 4X4 12PLY STRL (GAUZE/BANDAGES/DRESSINGS) ×3 IMPLANT
GAUZE SPONGE 4X4 16PLY XRAY LF (GAUZE/BANDAGES/DRESSINGS) ×6 IMPLANT
GLOVE SURG ORTHO 8.0 STRL STRW (GLOVE) ×3 IMPLANT
GOWN STRL REUS W/TWL XL LVL3 (GOWN DISPOSABLE) ×6 IMPLANT
HEMOSTAT SURGICEL 2X4 FIBR (HEMOSTASIS) IMPLANT
ILLUMINATOR WAVEGUIDE N/F (MISCELLANEOUS) ×3 IMPLANT
KIT BASIN OR (CUSTOM PROCEDURE TRAY) ×3 IMPLANT
LIGHT WAVEGUIDE WIDE FLAT (MISCELLANEOUS) IMPLANT
PACK BASIC VI WITH GOWN DISP (CUSTOM PROCEDURE TRAY) ×3 IMPLANT
POWDER SURGICEL 3.0 GRAM (HEMOSTASIS) IMPLANT
SHEARS HARMONIC 9CM CVD (BLADE) ×3 IMPLANT
STAPLER VISISTAT 35W (STAPLE) IMPLANT
STRIP CLOSURE SKIN 1/2X4 (GAUZE/BANDAGES/DRESSINGS) ×2 IMPLANT
SUT MNCRL AB 4-0 PS2 18 (SUTURE) ×3 IMPLANT
SUT SILK 2 0 (SUTURE)
SUT SILK 2-0 18XBRD TIE 12 (SUTURE) IMPLANT
SUT SILK 3 0 (SUTURE)
SUT SILK 3-0 18XBRD TIE 12 (SUTURE) IMPLANT
SUT VIC AB 3-0 SH 18 (SUTURE) ×6 IMPLANT
SYR BULB IRRIGATION 50ML (SYRINGE) ×3 IMPLANT
TAPE CLOTH SURG 4X10 WHT LF (GAUZE/BANDAGES/DRESSINGS) ×3 IMPLANT
TOWEL OR 17X26 10 PK STRL BLUE (TOWEL DISPOSABLE) ×3 IMPLANT
TOWEL OR NON WOVEN STRL DISP B (DISPOSABLE) ×3 IMPLANT
YANKAUER SUCT BULB TIP 10FT TU (MISCELLANEOUS) ×3 IMPLANT

## 2017-05-09 NOTE — Transfer of Care (Signed)
Immediate Anesthesia Transfer of Care Note  Patient: Krista Hodges  Procedure(s) Performed: TOTAL THYROIDECTOMY WITH LIMITED CENTRAL COMPARTMENT LYMPH NODE DISSECTION (N/A Neck)  Patient Location: PACU  Anesthesia Type:General  Level of Consciousness: sedated  Airway & Oxygen Therapy: Patient Spontanous Breathing and Patient connected to face mask oxygen  Post-op Assessment: Report given to RN and Post -op Vital signs reviewed and stable  Post vital signs: Reviewed and stable  Last Vitals:  Vitals Value Taken Time  BP 131/84 05/09/2017 10:34 AM  Temp    Pulse 79 05/09/2017 10:35 AM  Resp 17 05/09/2017 10:35 AM  SpO2 100 % 05/09/2017 10:35 AM  Vitals shown include unvalidated device data.  Last Pain:  Vitals:   05/09/17 0708  TempSrc: Oral  PainSc: 0-No pain      Patients Stated Pain Goal: 4 (82/57/49 3552)  Complications: No apparent anesthesia complications

## 2017-05-09 NOTE — Anesthesia Postprocedure Evaluation (Signed)
Anesthesia Post Note  Patient: Krista Hodges  Procedure(s) Performed: TOTAL THYROIDECTOMY WITH LIMITED CENTRAL COMPARTMENT LYMPH NODE DISSECTION (N/A Neck)     Patient location during evaluation: PACU Anesthesia Type: General Level of consciousness: awake and alert Pain management: pain level controlled Vital Signs Assessment: post-procedure vital signs reviewed and stable Respiratory status: spontaneous breathing, nonlabored ventilation, respiratory function stable and patient connected to nasal cannula oxygen Cardiovascular status: blood pressure returned to baseline and stable Postop Assessment: no apparent nausea or vomiting Anesthetic complications: no    Last Vitals:  Vitals:   05/09/17 0708 05/09/17 1034  BP: 115/76 131/84  Pulse: (!) 57 74  Resp: 18 15  Temp: 36.6 C 36.9 C  SpO2: 100% 100%    Last Pain:  Vitals:   05/09/17 1130  TempSrc:   PainSc: 4                  Alfie Alderfer S

## 2017-05-09 NOTE — Anesthesia Procedure Notes (Signed)
Procedure Name: Intubation Date/Time: 05/09/2017 8:35 AM Performed by: Lavina Hamman, CRNA Pre-anesthesia Checklist: Patient identified, Emergency Drugs available, Suction available, Patient being monitored and Timeout performed Patient Re-evaluated:Patient Re-evaluated prior to induction Oxygen Delivery Method: Circle system utilized Preoxygenation: Pre-oxygenation with 100% oxygen Induction Type: IV induction Ventilation: Mask ventilation without difficulty Laryngoscope Size: Mac and 4 Grade View: Grade II Tube type: Oral Tube size: 7.0 mm Number of attempts: 1 Airway Equipment and Method: Stylet Placement Confirmation: ETT inserted through vocal cords under direct vision,  positive ETCO2,  CO2 detector and breath sounds checked- equal and bilateral Secured at: 21 cm Tube secured with: Tape Dental Injury: Teeth and Oropharynx as per pre-operative assessment  Comments: Prominent dentition, small mouth opening, ATOI.

## 2017-05-09 NOTE — Op Note (Signed)
Procedure Note  Pre-operative Diagnosis:  Papillary thyroid carcinoma  Post-operative Diagnosis:  same  Surgeon:  Armandina Gemma, MD  Assistant:  none   Procedure:  Total thyroidectomy with limited lymph node dissection  Anesthesia:  General  Estimated Blood Loss:  minimal  Drains: none         Specimen: thyroid to pathology  Indications:  Patient returns for follow-up of bilateral thyroid nodules. Patient had previously undergone a right fine-needle aspiration biopsy which showed a benign hyperplastic thyroid nodule. Patient has a nodule in the left thyroid lobe which fine-needle aspiration showed a follicular lesion of undetermined significance. As follow-up, she underwent a thyroid ultrasound which identified a suspicious 1 cm nodule in the left side of the thyroid isthmus. Biopsy was performed and showed papillary thyroid carcinoma. Patient now returns to discuss these findings and to discuss plans for surgical resection.  Procedure Details: Procedure was done in OR #1 at the [facility].  The patient was brought to the operating room and placed in a supine position on the operating room table.  Following administration of general anesthesia, the patient was positioned and then prepped and draped in the usual aseptic fashion.  After ascertaining that an adequate level of anesthesia had been achieved, a Kocher incision was made with #15 blade.  Dissection was carried through subcutaneous tissues and platysma. Hemostasis was achieved with the electrocautery.  Skin flaps were elevated cephalad and caudad from the thyroid notch to the sternal notch.  The Mahorner self-retaining retractor was placed for exposure.  Strap muscles were incised in the midline and dissection was begun on the left side.  Strap muscles were reflected laterally.  Left thyroid lobe was normal in size with small nodules.  The left lobe was gently mobilized with blunt dissection.  Superior pole vessels were  dissected out and divided individually between small and medium Ligaclips with the Harmonic scalpel.  The thyroid lobe was rolled anteriorly.  Branches of the inferior thyroid artery were divided between small Ligaclips with the Harmonic scalpel.  Inferior venous tributaries were divided between Ligaclips.  Both the superior and inferior parathyroid glands were identified and preserved on their vascular pedicles.  The recurrent laryngeal nerve was identified and preserved along its course.  The ligament of Gwenlyn Found was released with the electrocautery and the gland was mobilized onto the anterior trachea. Isthmus was mobilized across the midline.  In the left side of the isthmus was a firm hard nodule measuring approximately 1 cm in size. There was a small pyramidal lobe present which was dissected off of the anterior thyroid cartilage and resected en bloc with the isthmus.  Dry pack was placed in the left neck.  Next, the right thyroid lobe was gently mobilized with blunt dissection.  Right thyroid lobe was normal in size with multiple small nodules.  Superior pole vessels were dissected out and divided between small and medium Ligaclips with the Harmonic scalpel.  Superior parathyroid was identified and preserved.  Inferior venous tributaries were divided between medium Ligaclips with the Harmonic scalpel.  The right thyroid lobe was rolled anteriorly and the branches of the inferior thyroid artery divided between small Ligaclips.  The right recurrent laryngeal nerve was identified and preserved along its course.  The ligament of Gwenlyn Found was released with the electrocautery.  The right thyroid lobe was mobilized onto the anterior trachea and the remainder of the thyroid was dissected off the anterior trachea and the thyroid was completely excised.  A suture was  used to mark the left lobe. The entire thyroid gland was submitted to pathology for review.  The adipose tissue anterior to the trachea between the carotid  arteries extending down to the subclavian artery was dissected out.  Vascular structures were divided between ligaclips with the harmonic scalpel.  There was no obvious lymphadenopathy within this tissue.  Tissue was excised and submitted to pathology separately labeled as central compartment lymph nodes.  The neck was irrigated with warm saline.  Surgicel powder was placed throughout the operative field.  Strap muscles were reapproximated in the midline with interrupted 3-0 Vicryl sutures.  Platysma was closed with interrupted 3-0 Vicryl sutures.  Skin was closed with a running 4-0 Monocryl subcuticular suture.  Wound was washed and dried and steri-strips were applied.  Dry gauze dressing was placed.  The patient was awakened from anesthesia and brought to the recovery room.  The patient tolerated the procedure well.   Armandina Gemma, MD [facility] Office: [phone]

## 2017-05-09 NOTE — Anesthesia Preprocedure Evaluation (Signed)
Anesthesia Evaluation  Patient identified by MRN, date of birth, ID band Patient awake    Reviewed: Allergy & Precautions, NPO status , Patient's Chart, lab work & pertinent test results  Airway Mallampati: II  TM Distance: >3 FB Neck ROM: Full    Dental no notable dental hx.    Pulmonary neg pulmonary ROS, former smoker,    Pulmonary exam normal breath sounds clear to auscultation       Cardiovascular hypertension, Normal cardiovascular exam Rhythm:Regular Rate:Normal     Neuro/Psych negative neurological ROS  negative psych ROS   GI/Hepatic Neg liver ROS, PUD,   Endo/Other  negative endocrine ROS  Renal/GU negative Renal ROS  negative genitourinary   Musculoskeletal negative musculoskeletal ROS (+)   Abdominal   Peds negative pediatric ROS (+)  Hematology negative hematology ROS (+)   Anesthesia Other Findings   Reproductive/Obstetrics negative OB ROS                             Anesthesia Physical Anesthesia Plan  ASA: II  Anesthesia Plan: General   Post-op Pain Management:    Induction: Intravenous  PONV Risk Score and Plan: 3 and Ondansetron, Dexamethasone and Treatment may vary due to age or medical condition  Airway Management Planned: Oral ETT  Additional Equipment:   Intra-op Plan:   Post-operative Plan: Extubation in OR  Informed Consent: I have reviewed the patients History and Physical, chart, labs and discussed the procedure including the risks, benefits and alternatives for the proposed anesthesia with the patient or authorized representative who has indicated his/her understanding and acceptance.   Dental advisory given  Plan Discussed with: CRNA and Surgeon  Anesthesia Plan Comments:         Anesthesia Quick Evaluation

## 2017-05-09 NOTE — Interval H&P Note (Signed)
History and Physical Interval Note:  05/09/2017 7:54 AM  Krista Hodges  has presented today for surgery, with the diagnosis of PAPILLARY THYROID CARCINOMA.  The various methods of treatment have been discussed with the patient and family. After consideration of risks, benefits and other options for treatment, the patient has consented to    Procedure(s): TOTAL THYROIDECTOMY WITH LIMITED LYMPH NODE DISSECTION (N/A) as a surgical intervention .    The patient's history has been reviewed, patient examined, no change in status, stable for surgery.  I have reviewed the patient's chart and labs.  Questions were answered to the patient's satisfaction.    Armandina Gemma, [facility] Office: [place]

## 2017-05-10 ENCOUNTER — Encounter (HOSPITAL_COMMUNITY): Payer: Self-pay | Admitting: Surgery

## 2017-05-10 DIAGNOSIS — C73 Malignant neoplasm of thyroid gland: Secondary | ICD-10-CM | POA: Diagnosis not present

## 2017-05-10 LAB — BASIC METABOLIC PANEL
ANION GAP: 9 (ref 5–15)
BUN: 18 mg/dL (ref 6–20)
CALCIUM: 9.9 mg/dL (ref 8.9–10.3)
CO2: 28 mmol/L (ref 22–32)
Chloride: 98 mmol/L — ABNORMAL LOW (ref 101–111)
Creatinine, Ser: 1.04 mg/dL — ABNORMAL HIGH (ref 0.44–1.00)
GFR calc non Af Amer: 56 mL/min — ABNORMAL LOW (ref >60)
Glucose, Bld: 112 mg/dL — ABNORMAL HIGH (ref 65–99)
POTASSIUM: 3.9 mmol/L (ref 3.5–5.1)
Sodium: 135 mmol/L (ref 135–145)

## 2017-05-10 MED ORDER — TRAMADOL HCL 50 MG PO TABS: 50.0000 mg | ORAL_TABLET | Freq: Four times a day (QID) | ORAL | 0 refills | Status: DC | PRN

## 2017-05-10 MED ORDER — CALCIUM CARBONATE ANTACID 500 MG PO CHEW: 2.0000 | CHEWABLE_TABLET | Freq: Two times a day (BID) | ORAL | 1 refills | Status: DC

## 2017-05-10 MED ORDER — LEVOTHYROXINE SODIUM 88 MCG PO TABS: 88.0000 ug | ORAL_TABLET | Freq: Every day | ORAL | 3 refills | Status: DC

## 2017-05-10 MED ORDER — BOOST / RESOURCE BREEZE PO LIQD CUSTOM: 1.0000 | Freq: Two times a day (BID) | ORAL | Status: DC

## 2017-05-10 NOTE — Discharge Instructions (Signed)
[facility]  THYROID & PARATHYROID SURGERY:  POST-OP INSTRUCTIONS  Always review your discharge instruction sheet from the facility where your surgery was performed.  A prescription for pain medication may be given to you upon discharge.  Take your pain medication as prescribed.  If narcotic pain medicine is not needed, then you may take acetaminophen (Tylenol) or ibuprofen (Advil) as needed.  Take your usually prescribed medications unless otherwise directed.  If you need a refill on your pain medication, please contact our office during regular business hours.  Prescriptions cannot be processed by our office after 5 pm or on weekends.  Start with a light diet upon arrival home, such as soup and crackers or toast.  Be sure to drink plenty of fluids daily.  Resume your normal diet the day after surgery.  Most patients will experience some swelling and bruising on the chest and neck area.  Ice packs will help.  Swelling and bruising can take several days to resolve.   It is common to experience some constipation after surgery.  Increasing fluid intake and taking a stool softener (Colace) will usually help or prevent this problem.  A mild laxative (Milk of Magnesia or Miralax) should be taken according to package directions if there has been no bowel movement after 48 hours.  You have steri-strips and a gauze dressing over your incision.  You may remove the gauze bandage on the second day after surgery, and you may shower at that time.  Leave your steri-strips (small skin tapes) in place directly over the incision.  These strips should remain on the skin for 5-7 days and then be removed.  You may get them wet in the shower and pat them dry.  You may resume regular (light) daily activities beginning the next day (such as daily self-care, walking, climbing stairs) gradually increasing activities as tolerated.  You may have sexual intercourse when it is comfortable.  Refrain from  any heavy lifting or straining until approved by your doctor.  You may drive when you no longer are taking prescription pain medication, you can comfortably wear a seatbelt, and you can safely maneuver your car and apply brakes.  You should see your doctor in the office for a follow-up appointment approximately three weeks after your surgery.  Make sure that you call for this appointment within a day or two after you arrive home to insure a convenient appointment time.  WHEN TO CALL YOUR DOCTOR: -- Fever greater than 101.5 -- Inability to urinate -- Nausea and/or vomiting - persistent -- Extreme swelling or bruising -- Continued bleeding from incision -- Increased pain, redness, or drainage from the incision -- Difficulty swallowing or breathing -- Muscle cramping or spasms -- Numbness or tingling in hands or around lips  The clinic staff is available to answer your questions during regular business hours.  Please don't hesitate to call and ask to speak to one of the nurses if you have concerns.  Cleland Simkins, MD [facility] Office: [phone] 

## 2017-05-10 NOTE — Progress Notes (Signed)
Initial Nutrition Assessment  DOCUMENTATION CODES:   Not applicable  INTERVENTION:  Boost Breeze po TID, each supplement provides 250 kcal and 9 grams of protein  Magic cup BID with at lunch/dinner, each supplement provides 290 kcal and 9 grams of protein   NUTRITION DIAGNOSIS:   Increased nutrient needs related to cancer and cancer related treatments as evidenced by estimated needs.   GOAL:   Patient will meet greater than or equal to 90% of their needs   MONITOR:   PO intake, Supplement acceptance, Weight trends, Skin  REASON FOR ASSESSMENT:   Malnutrition Screening Tool    ASSESSMENT:   62 yo female admitted 3/28 with papillary thyroid carcinoma s/p thyroidectomy w/ partial lymph node dissection with history significant for stage II grade 2 endometrial adenocarcinoma. PMH HTN, HLD, left nephrectomy w/ partial ureterectomy (2016)   Medications: Os-cal, mirapex, hydrochlorothiazide, IV D5% with NaCl @ 50 mL/hr   Labs reviewed  Spoke with pt:   She has had a decreased appetite for past few years (since cancer diagnosis ~50-75% PO of); however, she has been using strategies to eat more protein/calories such as eating protein bars  Her appetite further decreased in the past 2 weeks resulting in PO intake of continued <=50% of usual  Pt denies any issues with N/V/D; she does report that she has some pain/discomfort with swallowing s/p surgery on her neck, but she is managing to eat by taking small bites of food  Pt has been drinking Ensure, but she requested a nutrition supplement that is low in potassium d/t her renal surgical hx. She was willing to try Boost Breeze, Prostat, will offer magic cup as well   Pt weight has been relatively stable for past 4 months  Wt Readings from Last 15 Encounters:  05/09/17 164 lb (74.4 kg)  05/02/17 164 lb (74.4 kg)  01/31/17 157 lb 12.8 oz (71.6 kg)  12/19/16 161 lb 12.8 oz (73.4 kg)  03/15/16 176 lb 3.2 oz (79.9 kg)   05/03/15 187 lb (84.8 kg)  05/21/14 194 lb (88 kg)  05/17/14 194 lb (88 kg)  04/04/14 202 lb (91.6 kg)  11/30/13 202 lb 12.8 oz (92 kg)  10/20/13 205 lb 12.8 oz (93.4 kg)     NUTRITION - FOCUSED PHYSICAL EXAM:    Most Recent Value  Orbital Region  No depletion  Upper Arm Region  Mild depletion  Thoracic and Lumbar Region  No depletion  Buccal Region  No depletion  Temple Region  No depletion  Clavicle Bone Region  No depletion  Clavicle and Acromion Bone Region  No depletion  Scapular Bone Region  No depletion  Dorsal Hand  No depletion  Patellar Region  No depletion  Anterior Thigh Region  No depletion  Posterior Calf Region  No depletion  Edema (RD Assessment)  None  Hair  Reviewed  Eyes  Reviewed  Mouth  Reviewed  Skin  Reviewed  Nails  Reviewed       Diet Order:  Diet regular Room service appropriate? Yes; Fluid consistency: Thin  EDUCATION NEEDS:   No education needs have been identified at this time  Skin:  Skin Assessment: Skin Integrity Issues: Skin Integrity Issues:: Incisions Incisions: neck, abdomen  Last BM:  3/27  Height:   Ht Readings from Last 1 Encounters:  05/09/17 5' 5.5" (1.664 m)    Weight:   Wt Readings from Last 1 Encounters:  05/09/17 164 lb (74.4 kg)    Ideal Body Weight:  57.9  kg  BMI:  Body mass index is 26.88 kg/m.  Estimated Nutritional Needs:   Kcal:  2150-2350 kcal (29-32 kcal/kg)  Protein:  108-118 grams (20% kcal)  Fluid:  >=2.1 L/day    Edmonia Lynch Dietetic Intern Pager: [phone]

## 2017-05-10 NOTE — Discharge Summary (Signed)
Physician Discharge Summary [facility]  Patient ID: Krista Hodges MRN: [id number] DOB/AGE: 05/24/1955 62 y.o.  Admit date: 05/09/2017 Discharge date: 05/10/2017  Admission Diagnoses:  Papillary thyroid carcinoma  Discharge Diagnoses:  Principal Problem:   Papillary thyroid carcinoma [facility])   Discharged Condition: good  Hospital Course: Patient was admitted for observation following thyroid surgery.  Post op course was uncomplicated.  Pain was well controlled.  Tolerated diet.  Post op calcium level on morning following surgery was 9.9 mg/dl.  Patient was prepared for discharge home on POD#1.  Consults: None  Treatments: surgery: total thyroidectomy with limited lymph node dissection  Discharge Exam: Blood pressure 125/75, pulse 72, temperature 98.5 F (36.9 C), temperature source Oral, resp. rate 16, height 5' 5.5" (1.664 m), weight 74.4 kg (164 lb), SpO2 98 %. HEENT - clear Neck - wound dry and intact; minimal STS; voice normal Chest - clear bilaterally Cor - RRR  Disposition: Home   Allergies as of 05/10/2017      Reactions   Penicillins Hives   Whelts. Has tolerated cephazolin and cephalexin. Has patient had a PCN reaction causing immediate rash, facial/tongue/throat swelling, SOB or lightheadedness with hypotension: No Has patient had a PCN reaction causing severe rash involving mucus membranes or skin necrosis: Yes Has patient had a PCN reaction that required hospitalization: No Has patient had a PCN reaction occurring within the last 10 years: No If all of the above answers are "NO", then may proceed with Cephalosporin use.   Aspirin    Cannot take due to nephrectomy   Keppra [levetiracetam] Nausea Only   Anxiety    Nsaids    Cannot take due to nephrectomy   Requip [ropinirole Hcl]    Headaches, restlessness      Medication List    TAKE these medications   acetaminophen 500 MG tablet Commonly known as:  TYLENOL Take 500-1,000 mg by  mouth daily as needed for moderate pain or headache.   buPROPion 100 MG tablet Commonly known as:  WELLBUTRIN Take 200 mg by mouth daily.   busPIRone 15 MG tablet Commonly known as:  BUSPAR Take 15 mg by mouth 2 (two) times daily.   calcium carbonate 500 MG chewable tablet Commonly known as:  TUMS Chew 2 tablets (400 mg of elemental calcium total) by mouth 2 (two) times daily.   docusate sodium 100 MG capsule Commonly known as:  COLACE Take 1 capsule (100 mg total) by mouth 2 (two) times daily as needed (take to keep stool soft.). What changed:  when to take this   doxylamine (Sleep) 25 MG tablet Commonly known as:  UNISOM Take 25 mg by mouth at bedtime as needed for sleep.   hydrochlorothiazide 25 MG tablet Commonly known as:  HYDRODIURIL Take 25 mg by mouth daily.   irbesartan 300 MG tablet Commonly known as:  AVAPRO Take 300 mg daily by mouth.   levothyroxine 88 MCG tablet Commonly known as:  SYNTHROID Take 1 tablet (88 mcg total) by mouth daily before breakfast.   MULTIVITAMIN GUMMIES ADULT Chew Chew 2 tablets by mouth daily.   pramipexole 0.5 MG tablet Commonly known as:  MIRAPEX Take 0.5 mg by mouth at bedtime.   PROBIOTIC PO Take 1 capsule by mouth daily.   rosuvastatin 10 MG tablet Commonly known as:  CRESTOR Take 10 mg by mouth at bedtime.   sertraline 50 MG tablet Commonly known as:  ZOLOFT Take 100 mg by mouth daily.   SYSTANE BALANCE  0.6 % Soln Generic drug:  Propylene Glycol Place 1 drop into both eyes daily.   traMADol 50 MG tablet Commonly known as:  ULTRAM Take 50-100 mg by mouth 2 (two) times daily as needed for moderate pain. What changed:  Another medication with the same name was added. Make sure you understand how and when to take each.   traMADol 50 MG tablet Commonly known as:  ULTRAM Take 1-2 tablets (50-100 mg total) by mouth every 6 (six) hours as needed for moderate pain (mild pain not responsive to acetaminophen). What  changed:  You were already taking a medication with the same name, and this prescription was added. Make sure you understand how and when to take each.   traZODone 50 MG tablet Commonly known as:  DESYREL Take 50-100 mg by mouth at bedtime as needed for sleep.   vitamin C 250 MG tablet Commonly known as:  ASCORBIC ACID Take 500 mg by mouth daily.   Vitamin D 2000 units tablet Take 2,000 Units by mouth daily.        Earnstine Regal, MD, [facility] Office: [phone]   Signed: Earnstine Regal 05/10/2017, 11:42 AM

## 2017-05-28 ENCOUNTER — Encounter: Payer: No Typology Code available for payment source | Admitting: Psychology

## 2017-06-05 ENCOUNTER — Other Ambulatory Visit: Payer: Self-pay | Admitting: Internal Medicine

## 2017-06-05 DIAGNOSIS — C73 Malignant neoplasm of thyroid gland: Secondary | ICD-10-CM

## 2017-06-11 ENCOUNTER — Ambulatory Visit
Admission: RE | Admit: 2017-06-11 | Discharge: 2017-06-11 | Disposition: A | Discharge status: Home / Self Care | Payer: PRIVATE HEALTH INSURANCE | Source: Ambulatory Visit | Attending: Internal Medicine | Admitting: Internal Medicine

## 2017-06-11 DIAGNOSIS — C73 Malignant neoplasm of thyroid gland: Secondary | ICD-10-CM

## 2017-10-15 ENCOUNTER — Ambulatory Visit (INDEPENDENT_AMBULATORY_CARE_PROVIDER_SITE_OTHER): Payer: PRIVATE HEALTH INSURANCE | Admitting: Psychology

## 2017-10-15 ENCOUNTER — Encounter: Payer: Self-pay | Admitting: Psychology

## 2017-10-15 DIAGNOSIS — F339 Major depressive disorder, recurrent, unspecified: Secondary | ICD-10-CM

## 2017-10-15 DIAGNOSIS — R413 Other amnesia: Secondary | ICD-10-CM

## 2017-10-15 NOTE — Progress Notes (Signed)
NEUROBEHAVIORAL STATUS EXAM   Name: Krista Hodges Date of Birth: February 25, 1955 Date of Interview: 10/15/2017  Reason for Referral:  Krista Hodges is a 62 y.o. female who is referred for neuropsychological evaluation by Dr. Sarina Ill of [facility] due to concerns about cognitive decline. This patient is accompanied in the office by her husband who supplements the history.  History of Presenting Problem:  Ms. Krista Hodges has been followed by Dr. Jaynee Eagles since February 2018 for memory concerns. MRI brain in March 2018 reportedly revealed the following:  1. Mild generalized cortical atrophy. 2. Few scattered T2/FLAIR hyperintense foci in the hemispheres consistent with age appropriate minimal chronic white vessel ischemic change. 3. Single right frontal microhemorrhage. A single focus is unlikely to be clinically significant..  She was most recently seen by Dr. Jaynee Eagles on 01/31/2017 when she reported new concern of LUE tremor/shaking that was episodic and would last for hours. Her PCP had placed her on Keppra. She was asymptomatic at her appointment with Dr. Jaynee Eagles. She had an EEG on 02/14/2017 which was normal. She reported the shaking episodes in her LUE lessened over time. A PET brain scan has been ordered. The patient has a family history of Parkinson's disease in her father. It seems he also had dementia, unclear if PDD or AD. He died in his 44s. There is no other known family history of dementia or neurodegenerative disorder. At today's appointment (10/15/2017), the patient reports the shaking in her arm has resolved and is no longer occurring.  The patient's husband reports gradual onset of forgetfulness and confusion prior to her surgeries but worsening over time and perhaps worsened after anesthesia. The patient is concerned about forgetfulness as well and becomes tearful when discussing it. She looks to her husband for many answers or, alternatively, will deny a  cognitive symptom that her husband reports he has observed. They report both short term and long term memory issues. 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 She had been there 10 years. She was let go due to memory issues.  Upon direct questioning, the patient/caregiver reported the following with regard to current cognitive functioning:  Forgetting recent conversations/events: Yes Repeating statements/questions: Yes per husband Misplacing/losing items: Yes per husband (glasses- frequently) Difficulty concentrating: Mind gets scattered easily, not necessarily new but not lifelong Starting but not finishing tasks: Not very often Processing information more slowly: No Word-finding difficulty: No Comprehension difficulty: No Getting lost when driving: Yes per husband- this did happen, she called him when coming home from work and told him she got lost (had made a wrong turn), he says this happened a few times, she does not think this ever occurred Uncertain about directions when driving or passenger: Yes per husband, no per patient. Husband reports she forgot how to get to Dr. Inda Merlin' office as well.  The patient is now only doing very minimal driving. She has not had any MVAs in recent years. Her husband recently started helping with medication management/filling her pill planner. She sometimes does forget to take her medication. Her husband has always managed the bills and finances. She is getting more confused about appointments and asking her husband more about when they are. She states she does quite a bit of cooking still and she denies any problems with this. She does the food shopping but her husband states she won't remember what she needs without a list (she contends she does  fine without a list).  When asked about how she is feeling physically, she states "I guess okay". She denies pain initially but her husband reminds her that she has been  complaining of leg pain. She states this is a new issue (right leg/knee). Her husband reports mild unsteadiness. She has not had any falls.  She was diagnosed with kidney cancer in 2016, her left kidney was removed and she had radiation last year, per her husband. She was diagnosed with thyroid cancer in late 2018 and had her thyroid removed. She reports she is now in remission. She has never had chemotherapy. She is a former smoker, quit about 10 years ago.  The patient has a history of depression and has been treated with psychotropic medications over the years. She is currently prescribed Wellbutrin, Buspar, Zoloft and Trazodone. She does not see a mental health professional. Her medications are managed by her PCP. She and her husband report low mood in recent years due to her frustration with her memory difficulty. She has sleep difficulty, usually with sleep maintenance. Some mornings she does not feel rested upon awakening. However, she does not have significant, daily fatigue. Her appetite fluctuates, some days eating only once in a day, other days having 3 meals. She admits her mood is "crummy" and anxious. She reports she always feels stressed because "I never do anything right". Her husband reports that her mood/depression has worsened in the past year or two with increasing memory problems. She admits she rarely experiences happiness anymore. She admits to passive suicidal ideation and occasionally wishing she was no longer alive; however, she denies suicidal intention or planning. She reports family is a protective factor. She denies any history of psychosis or visual illusions/hallucinations.   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  Marital history: Married 51 years, 3 children (one son lives next door, daughter lives not far away), looks to husband for how  many grands and then counts up 6 (which he confirms is accurate)  Alcohol: Sometimes, usually couple glasses of wine, never a heavy drinker Tobacco: Quit smoking ~10 years ago SA: None   Medical History: Past Medical History:  Diagnosis Date  . Allergic rhinitis   . Anxiety state   . Arthritis, degenerative   . Atypical migraine   . Awareness of heartbeats   . Cancer of left kidney (HCC)    Left Kidney  . Cervical radiculitis   . CFIDS (chronic fatigue and immune dysfunction syndrome) ([place])   . Depression, major, single episode, in partial remission ([place])   . Depression, neurotic   . Elevated serum creatinine 03/2016   history of (2.07)  . Endometrial adenocarcinoma ([place])    Stage II grade2  . Essential (primary) hypertension   . Gastric ulcer   . Heart murmur    pt. denies  . HLD (hyperlipidemia)   . Inflammation of hand joint   . Memory difficulties   . Premature complex, ventricular   . Renal disorder   . Renal mass, left   . Restless leg   . Shortness of breath dyspnea    occasional  . 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 Tremor    L arm  . Vitamin D deficiency       Current Medications:  Outpatient Encounter Medications as of 10/15/2017  Medication Sig  . acetaminophen (TYLENOL) 500 MG  tablet Take 500-1,000 mg by mouth daily as needed for moderate pain or headache.  Marland Kitchen buPROPion (WELLBUTRIN) 100 MG tablet Take 200 mg by mouth daily.  . busPIRone (BUSPAR) 15 MG tablet Take 15 mg by mouth 2 (two) times daily.  . calcium carbonate (TUMS) 500 MG chewable tablet Chew 2 tablets (400 mg of elemental calcium total) by mouth 2 (two) times daily.  . Cholecalciferol (VITAMIN D) 2000 units tablet Take 2,000 Units by mouth daily.  Marland Kitchen docusate sodium (COLACE) 100 MG capsule Take 1 capsule (100 mg total) by mouth 2 (two) times daily as needed (take to keep stool soft.). (Patient taking differently: Take 100 mg by mouth daily as needed (take to keep stool  soft.). )  . doxylamine, Sleep, (UNISOM) 25 MG tablet Take 25 mg by mouth at bedtime as needed for sleep.  . hydrochlorothiazide (HYDRODIURIL) 25 MG tablet Take 25 mg by mouth daily.  . irbesartan (AVAPRO) 300 MG tablet Take 300 mg daily by mouth.   . levothyroxine (SYNTHROID) 88 MCG tablet Take 1 tablet (88 mcg total) by mouth daily before breakfast.  . Multiple Vitamins-Minerals (MULTIVITAMIN GUMMIES ADULT) CHEW Chew 2 tablets by mouth daily.  . pramipexole (MIRAPEX) 0.5 MG tablet Take 0.5 mg by mouth at bedtime.  . Probiotic Product (PROBIOTIC PO) Take 1 capsule by mouth daily.   Marland Kitchen Propylene Glycol (SYSTANE BALANCE) 0.6 % SOLN Place 1 drop into both eyes daily.  . rosuvastatin (CRESTOR) 10 MG tablet Take 10 mg by mouth at bedtime.   . sertraline (ZOLOFT) 50 MG tablet Take 100 mg by mouth daily.  . traMADol (ULTRAM) 50 MG tablet Take 50-100 mg by mouth 2 (two) times daily as needed for moderate pain.   . traMADol (ULTRAM) 50 MG tablet Take 1-2 tablets (50-100 mg total) by mouth every 6 (six) hours as needed for moderate pain (mild pain not responsive to acetaminophen).  . traZODone (DESYREL) 50 MG tablet Take 50-100 mg by mouth at bedtime as needed for sleep.   . vitamin C (ASCORBIC ACID) 250 MG tablet Take 500 mg by mouth daily.   No facility-administered encounter medications on file as of 10/15/2017.    Notes from patient/husband regarding medication list: Takes mirapex "off and on", not daily Hasn't taken Unisom in a while Tramadol: takes "a couple times a week" Doesn't take trazodone every night, takes 1 if needed, hardly ever takes 2   Behavioral Observations:   Appearance: Neatly, casually and appropriately dressed and groomed Gait: Ambulated independently, some unsteadiness due to right knee pain/tightness reported by the patient Speech: Fluent; normal rate, rhythm and volume. Mildly increased response latencies. No significant word finding difficulty. Thought process: Generally  linear. Looks to husband for a lot of answers. Affect: Full, dysthymic, anxious Interpersonal: Pleasant, appropriate   45 minutes spent face-to-face with patient completing neurobehavioral status exam. 30 minutes spent integrating medical records/clinical data and completing this report. [id number] unit.   TESTING: There is medical necessity to proceed with neuropsychological assessment as the results will be used to aid in differential diagnosis and clinical decision-making and to inform specific treatment recommendations. Per the patient, her husband and medical records reviewed, there has been a change in cognitive functioning and a reasonable suspicion of neurocognitive disorder (rule out MCI/ AD).  Clinical Decision Making: In considering the patient's current level of functioning, level of presumed impairment, nature of symptoms, emotional and behavioral responses during the interview, level of literacy, and observed level of motivation, a battery of  tests was selected and communicated to the psychometrician.    PLAN: The patient will return next week to complete the above referenced full battery of neuropsychological testing with a psychometrician under my supervision. Education regarding testing procedures was provided to the patient. Subsequently, the patient will see this provider for a follow-up session at which time her test performances and my impressions and treatment recommendations will be reviewed in detail.  Evaluation ongoing; full report to follow.

## 2017-10-22 ENCOUNTER — Encounter: Payer: Self-pay | Admitting: Psychology

## 2017-10-22 ENCOUNTER — Ambulatory Visit: Payer: PRIVATE HEALTH INSURANCE | Admitting: Psychology

## 2017-10-22 DIAGNOSIS — R413 Other amnesia: Secondary | ICD-10-CM

## 2017-10-22 NOTE — Progress Notes (Signed)
   Neuropsychology Note  Krista Hodges completed 120 minutes of neuropsychological testing with technician, Milana Kidney, BS, under the supervision of Dr. Macarthur Critchley, Licensed Psychologist. The patient did not appear overtly distressed by the testing session, per behavioral observation or via self-report to the technician. Rest breaks were offered.   Clinical Decision Making: In considering the patient's current level of functioning, level of presumed impairment, nature of symptoms, emotional and behavioral responses during the interview, level of literacy, and observed level of motivation/effort, a battery of tests was selected and communicated to the psychometrician.  Communication between the psychologist and technician was ongoing throughout the testing session and changes were made as deemed necessary based on patient performance on testing, technician observations and additional pertinent factors such as those listed above.  Krista Hodges will return within approximately 2 weeks for an interactive feedback session with Dr. Si Raider at which time her test performances, clinical impressions and treatment recommendations will be reviewed in detail. The patient understands she can contact our office should she require our assistance before this time.  35 minutes spent performing neuropsychological evaluation services/clinical decision making (psychologist). [CPT 31540] 120 minutes spent face-to-face with patient administering standardized tests, 30 minutes spent scoring (technician). [CPT [id number], 08676]  Full report to follow.

## 2017-11-18 NOTE — Progress Notes (Deleted)
NEUROPSYCHOLOGICAL EVALUATION   Name:    Krista Hodges  Date of Birth:   10/25/1955 Date of Interview:  10/15/2017 Date of Testing:  10/22/2017   Date of Feedback:  11/19/2017       Background Information:  Reason for Referral:  Krista Hodges is a 62 y.o. female referred by Dr. Sarina Ill of [facility] to assess her current level of cognitive functioning and assist in differential diagnosis. The current evaluation consisted of a review of available medical records, an interview with the patient and her husband, and the completion of a neuropsychological testing battery. Informed consent was obtained.  History of Presenting Problem:  Krista Hodges has been followed by Dr. Jaynee Eagles since February 2018 for memory concerns. MRI brain in March 2018 reportedly revealed the following:  1. Mild generalized cortical atrophy. 2. Few scattered T2/FLAIR hyperintense foci in the hemispheres consistent with age appropriate minimal chronic white vessel ischemic change. 3. Single right frontal microhemorrhage. A single focus is unlikely to be clinically significant..  She was most recently seen by Dr. Jaynee Eagles on 01/31/2017 when she reported new concern of LUE tremor/shaking that was episodic and would last for hours. Her PCP had placed her on Keppra. She was asymptomatic at her appointment with Dr. Jaynee Eagles. She had an EEG on 02/14/2017 which was normal. She reported the shaking episodes in her LUE lessened over time. A PET brain scan has been ordered. The patient has a family history of Parkinson's disease in her father. It seems he also had dementia, unclear if PDD or AD. He died in his 35s. There is no other known family history of dementia or neurodegenerative disorder. At today's appointment (10/15/2017), the patient reports the shaking in her arm has resolved and is no longer occurring.  The patient's husband reports gradual onset of forgetfulness and confusion prior to her  surgeries but worsening over time and perhaps worsened after anesthesia. The patient is concerned about forgetfulness as well and becomes tearful when discussing it. She looks to her husband for many answers or, alternatively, will deny a cognitive symptom that her husband reports he has observed. They report both short term and long term memory issues. 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 She had been there 10 years. She was let go due to memory issues.  Upon direct questioning, the patient/caregiver reported the following with regard to current cognitive functioning:  Forgetting recent conversations/events: Yes Repeating statements/questions: Yes per husband Misplacing/losing items: Yes per husband (glasses- frequently) Difficulty concentrating: Mind gets scattered easily, not necessarily new but not lifelong Starting but not finishing tasks: Not very often Processing information more slowly: No Word-finding difficulty: No Comprehension difficulty: No Getting lost when driving: Yes per husband- this did happen, she called him when coming home from work and told him she got lost (had made a wrong turn), he says this happened a few times, she does not think this ever occurred Uncertain about directions when driving or passenger: Yes per husband, no per patient. Husband reports she forgot how to get to Dr. Inda Merlin' office as well.  The patient is now only doing very minimal driving. She has not had any MVAs in recent years. Her husband recently started helping with medication management/filling her pill planner. She sometimes does forget to take her medication. Her husband has always managed the bills and finances. She is getting more confused about appointments and asking her  husband more about when they are. She states she does quite a bit of cooking still and she denies any problems with this. She does the food shopping but her husband states she  won't remember what she needs without a list (she contends she does fine without a list).  When asked about how she is feeling physically, she states "I guess okay". She denies pain initially but her husband reminds her that she has been complaining of leg pain. She states this is a new issue (right leg/knee). Her husband reports mild unsteadiness. She has not had any falls.  She was diagnosed with kidney cancer in 2016, her left kidney was removed and she had radiation last year, per her husband. She was diagnosed with thyroid cancer in late 2018 and had her thyroid removed. She reports she is now in remission. She has never had chemotherapy. She is a former smoker, quit about 10 years ago.  The patient has a history of depression and has been treated with psychotropic medications over the years. She is currently prescribed Wellbutrin, Buspar, Zoloft and Trazodone. She does not see a mental health professional. Her medications are managed by her PCP. She and her husband report low mood in recent years due to her frustration with her memory difficulty. She has sleep difficulty, usually with sleep maintenance. Some mornings she does not feel rested upon awakening. However, she does not have significant, daily fatigue. Her appetite fluctuates, some days eating only once in a day, other days having 3 meals. She admits her mood is "crummy" and anxious. She reports she always feels stressed because "I never do anything right". Her husband reports that her mood/depression has worsened in the past year or two with increasing memory problems. She admits she rarely experiences happiness anymore. She admits to passive suicidal ideation and occasionally wishing she was no longer alive; however, she denies suicidal intention or plan. She reports family is a protective factor. She denies any history of psychosis or visual illusions/hallucinations.   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  Marital history: Married 25 years, 3 children (one son lives next door, daughter lives not far away), looks to husband for how many grands and then counts up 6 (which he confirms is accurate)  Alcohol: Sometimes, usually couple glasses of wine, never a heavy drinker Tobacco: Quit smoking ~10 years ago SA: None   Medical History:  Past Medical History:  Diagnosis Date  . Allergic rhinitis   . Anxiety state   . Arthritis, degenerative   . Atypical migraine   . Awareness of heartbeats   . Cancer of left kidney (HCC)    Left Kidney  . Cervical radiculitis   . CFIDS (chronic fatigue and immune dysfunction syndrome) ([place])   . Depression, major, single episode, in partial remission ([place])   . Depression, neurotic   . Elevated serum creatinine 03/2016   history of (2.07)  . Endometrial adenocarcinoma ([place])    Stage II grade2  . Essential (primary) hypertension   . Gastric ulcer   . Heart murmur    pt. denies  . HLD (hyperlipidemia)   . Inflammation of hand joint   . Memory difficulties   . Premature complex, ventricular   . Renal disorder   . Renal mass, left   . Restless leg   . Shortness of breath dyspnea    occasional  .  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 Tremor    L arm  . Vitamin D deficiency     Current medications:  Outpatient Encounter Medications as of 11/19/2017  Medication Sig  . acetaminophen (TYLENOL) 500 MG tablet Take 500-1,000 mg by mouth daily as needed for moderate pain or headache.  Marland Kitchen buPROPion (WELLBUTRIN) 100 MG tablet Take 200 mg by mouth daily.  . busPIRone (BUSPAR) 15 MG tablet Take 15 mg by mouth 2 (two) times daily.  . calcium carbonate (TUMS) 500 MG chewable tablet Chew 2 tablets (400 mg of elemental calcium total) by mouth 2 (two) times daily.  . Cholecalciferol (VITAMIN D) 2000 units tablet Take 2,000 Units by  mouth daily.  Marland Kitchen docusate sodium (COLACE) 100 MG capsule Take 1 capsule (100 mg total) by mouth 2 (two) times daily as needed (take to keep stool soft.). (Patient taking differently: Take 100 mg by mouth daily as needed (take to keep stool soft.). )  . doxylamine, Sleep, (UNISOM) 25 MG tablet Take 25 mg by mouth at bedtime as needed for sleep.  . hydrochlorothiazide (HYDRODIURIL) 25 MG tablet Take 25 mg by mouth daily.  . irbesartan (AVAPRO) 300 MG tablet Take 300 mg daily by mouth.   . levothyroxine (SYNTHROID) 88 MCG tablet Take 1 tablet (88 mcg total) by mouth daily before breakfast.  . Multiple Vitamins-Minerals (MULTIVITAMIN GUMMIES ADULT) CHEW Chew 2 tablets by mouth daily.  . pramipexole (MIRAPEX) 0.5 MG tablet Take 0.5 mg by mouth at bedtime.  . Probiotic Product (PROBIOTIC PO) Take 1 capsule by mouth daily.   Marland Kitchen Propylene Glycol (SYSTANE BALANCE) 0.6 % SOLN Place 1 drop into both eyes daily.  . rosuvastatin (CRESTOR) 10 MG tablet Take 10 mg by mouth at bedtime.   . sertraline (ZOLOFT) 50 MG tablet Take 100 mg by mouth daily.  . traMADol (ULTRAM) 50 MG tablet Take 50-100 mg by mouth 2 (two) times daily as needed for moderate pain.   . traMADol (ULTRAM) 50 MG tablet Take 1-2 tablets (50-100 mg total) by mouth every 6 (six) hours as needed for moderate pain (mild pain not responsive to acetaminophen).  . traZODone (DESYREL) 50 MG tablet Take 50-100 mg by mouth at bedtime as needed for sleep.   . vitamin C (ASCORBIC ACID) 250 MG tablet Take 500 mg by mouth daily.   No facility-administered encounter medications on file as of 11/19/2017.    Notes from patient/husband regarding medication list: Takes mirapex "off and on", not daily Hasn't taken Unisom in a while Tramadol: takes "a couple times a week" Doesn't take trazodone every night, takes 1 if needed, hardly ever takes 2   Current Examination:  Behavioral Observations:  Appearance: Neatly, casually and appropriately dressed and  groomed Gait: Ambulated independently, some unsteadiness due to right knee pain/tightness reported by the patient Speech: Fluent; normal rate, rhythm and volume. Mildly increased response latencies. No significant word finding difficulty. Thought process: Generally linear. Looks to husband for a lot of answers. Affect: Full, dysthymic, anxious Interpersonal: Pleasant, appropriate Orientation: Oriented to person, place, current month and current year. Disoriented to date and off one day on the day of the week. Accurately named the current President but could not name his immediate predecessor.   Tests Administered: . Test of Premorbid Functioning (TOPF) . Wechsler Adult Intelligence Scale-Fourth Edition (WAIS-IV): Similarities, Block Design, Matrix Reasoning, Coding and Digit Span subtests . Wechsler Memory Scale-Fourth Edition (WMS-IV) Adult Version (ages 92-69): Logical Memory I, II and  Recognition subtests  . [place] Verbal Learning Test - 2nd Edition (CVLT-2) Standard Form . Repeatable Battery for the Assessment of Neuropsychological Status (RBANS) Form A:  Figure Copy and Recall subtests and Semantic Fluency subtest . Boston Naming Test (BNT) . Boston Diagnostic Aphasia Examination: Complex Ideational Material subtest . Controlled Oral Word Association Test (COWAT) . Trail Making Test A and B . Clock drawing test . Beck Depression Inventory - 2nd Edition (BDI-II) . Generalized Anxiety Disorder - 7 item screener (GAD-7)  Test Results: Note: Standardized scores are presented only for use by appropriately trained professionals and to allow for any future test-retest comparison. These scores should not be interpreted without consideration of all the information that is contained in the rest of the report. The most recent standardization samples from the test publisher or other sources were used whenever possible to derive standard scores; scores were corrected for age, gender, ethnicity and  education when available.   Test Scores:  ***  Description of Test Results:  Please Note: Performances on embedded validity indicators suggested variable and possibly suboptimal levels of effort/attention. As such, the patient's current performance on neurocognitive testing may not provide a completely valid estimate of her current neurocognitive functioning. True abilities are thought to be of at least the level reported here, and impaired performances cannot be assumed to be reflect neurocognitive deficits.  Premorbid verbal intellectual abilities were estimated to have been within the average range based on a test of word reading.   Psychomotor processing speed ranged from borderline to severely impaired.   Auditory attention and working memory were average.   Visual-spatial construction was low average to average.   Language abilities were below expectation. Specifically, confrontation naming was severely impaired, and semantic verbal fluency was borderline (for fruits/vegetables) to impaired (for animals). Auditory comprehension of complex ideational material was below expectation.   With regard to verbal memory, encoding and acquisition of non-contextual information (i.e., word list) was severely impaired across four learning trials. After a brief interference task, free recall was severely impaired (1/16 items). With semantic cueing, she recalled only 2 additional items (severely impaired). After a delay, free recall was severely impaired (0/16 items). Cued recall was severely impaired (3/16 items) and interestingly did not correlate with previous cued recall trial. Performance on a yes/no recognition task was severely impaired with a very high number of false positive errors. Performance on a forced choice recognition task was severely impaired and atypical even for individuals with known neurocognitive disorder such as dementia. On another verbal memory test, encoding and acquisition of  contextual auditory information (i.e., short stories) was impaired. After a delay, free recall was impaired. Performance on a yes/no recognition task was below expectation. With regard to non-verbal memory, delayed free recall of visual information was severely impaired (no aspects of the original drawn figure were recalled).   Executive functioning was variable. Mental flexibility and set-shifting were impaired; she was unable to complete even the sample item of Trails B. Verbal fluency with phonemic search restrictions was borderline impaired. Verbal abstract reasoning was low average. Non-verbal abstract reasoning was average. Performance on a clock drawing task was impaired.   On a self-report measure of mood, the patient's responses were indicative of clinically significant, severe depression at the present time. Symptoms endorsed included: sadness, pessimism, feelings of failure, anhedonia, guilty feelings, punishment feelings, self-dislike, self-criticalness, tearfulness, restlessness, loss of interest, indecisiveness, worthlessness, loss of energy, decreased sleep, irritability, reduced appetite, concentration difficulty, fatigue and loss of libido. She endorsed  suicidal ideation but denied intention or plan. On a self-report measure of anxiety, the patient endorsed clinically significant, severe generalized anxiety characterized by nervousness, excessive worry, inability to control worrying, difficulty relaxing, restlessness and irritability on a daily basis.    Clinical Impressions: Major depressive disorder, severe; generalized anxiety disorder, severe. Cannot rule out underlying neurodegenerative dementia but this is difficulty to assess given possibility of suboptimal effort/attention on formal testing. On formal neurocognitive evaluation, the patient demonstrated significantly impaired performances on tests assessing learning/memory for new information, semantic retrieval (confrontation naming,  semantic fluency), and executive functioning. This cognitive profile is concerning for underlying Alzheimer's disease, but it should be noted that her performance on an embedded effort measure was atypical even for individuals with dementia. As such, it is difficult to know if current test results reflect true neurocognitive impairment or if there was psychological interference in test performance due to severe depression and anxiety. At this point, I would recommend psychiatry consultation (she is on several psychotropic medications managed by PCP) and individual therapy. Aricept could also be considered, given the possibility of underlying AD.     Recommendations/Plan: Based on the findings of the present evaluation, the following recommendations are offered:  1. Psychiatry and counseling (severe depression and anxiety, which can exacerbate cognitive dysfunction) 2. Could consider Aricept if patient is interested 3. Regular schedule/routine for behavioral activation and social engagement 4. Safe cardiovascular exercise for brain health 5. Re-evaluation in 1-2 years 6. Caregiver support   Feedback to Patient: Krista Hodges returned for a feedback appointment on 11/19/2017 to review the results of her neuropsychological evaluation with this provider. *** minutes face-to-face time was spent reviewing her test results, my impressions and my recommendations as detailed above.    Total time spent on this patient's case: 75 minutes for neurobehavioral status exam with psychologist (CPT code [phone]); 150 minutes of testing/scoring by psychometrician under psychologist's supervision (CPT codes [phone], [phone] units); *** minutes for integration of patient data, interpretation of standardized test results and clinical data, clinical decision making, treatment planning and preparation of this report, and interactive feedback with review of results to the patient/family by psychologist (CPT codes [phone],  [phone] units).      Thank you for your referral of Krista Hodges. Please feel free to contact me if you have any questions or concerns regarding this report.

## 2017-11-19 ENCOUNTER — Encounter: Admitting: Psychology

## 2017-11-22 ENCOUNTER — Encounter: Payer: Self-pay | Admitting: Psychology

## 2017-11-22 ENCOUNTER — Ambulatory Visit (INDEPENDENT_AMBULATORY_CARE_PROVIDER_SITE_OTHER): Payer: PRIVATE HEALTH INSURANCE | Admitting: Psychology

## 2017-11-22 DIAGNOSIS — F411 Generalized anxiety disorder: Secondary | ICD-10-CM

## 2017-11-22 DIAGNOSIS — R413 Other amnesia: Secondary | ICD-10-CM | POA: Diagnosis not present

## 2017-11-22 DIAGNOSIS — F322 Major depressive disorder, single episode, severe without psychotic features: Secondary | ICD-10-CM

## 2017-11-22 NOTE — Progress Notes (Signed)
NEUROPSYCHOLOGICAL EVALUATION   Name:    Krista Hodges  Date of Birth:   Aug 21, 1955 Date of Interview:  10/15/2017 Date of Testing:  10/22/2017   Date of Feedback:  11/22/2017       Background Information:  Reason for Referral:  Krista Hodges is a 62 y.o. female referred by Dr. Sarina Ill of [facility] to assess her current level of cognitive functioning and assist in differential diagnosis. The current evaluation consisted of a review of available medical records, an interview with the patient and her husband, and the completion of a neuropsychological testing battery. Informed consent was obtained.  History of Presenting Problem:  Krista Hodges has been followed by Dr. Jaynee Eagles since February 2018 for memory concerns. MRI brain in March 2018 reportedly revealed the following:  1.    Mild generalized cortical atrophy. 2.    Few scattered T2/FLAIR hyperintense foci in the hemispheres consistent with age appropriate minimal chronic white vessel ischemic change. 3.    Single right frontal microhemorrhage.   A single focus is unlikely to be clinically significant..   She was most recently seen by Dr. Jaynee Eagles on 01/31/2017 when she reported new concern of LUE tremor/shaking that was episodic and would last for hours. Her PCP had placed her on Keppra. She was asymptomatic at her appointment with Dr. Jaynee Eagles. She had an EEG on 02/14/2017 which was normal. She reported the shaking episodes in her LUE lessened over time. A PET brain scan has been ordered. The patient has a family history of Parkinson's disease in her father. It seems he also had dementia, unclear if PDD or AD. He died in his 37s. There is no other known family history of dementia or neurodegenerative disorder. At today's appointment (10/15/2017), the patient reports the shaking in her arm has resolved and is no longer occurring.   The patient's husband reports gradual onset of forgetfulness and confusion prior to her  surgeries but worsening over time and perhaps worsened after anesthesia. The patient is concerned about forgetfulness as well and becomes tearful when discussing it. She looks to her husband for many answers or, alternatively, will deny a cognitive symptom that her husband reports he has observed. They report both short term and long term memory issues. 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 She had been there 10 years. She was let go due to memory issues.   Upon direct questioning, the patient/caregiver reported the following with regard to current cognitive functioning:  Forgetting recent conversations/events: Yes Repeating statements/questions: Yes per husband Misplacing/losing items: Yes per husband (glasses- frequently) Difficulty concentrating: Mind gets scattered easily, not necessarily new but not lifelong Starting but not finishing tasks: Not very often Processing information more slowly: No Word-finding difficulty: No Comprehension difficulty: No Getting lost when driving: Yes per husband- this did happen, she called him when coming home from work and told him she got lost (had made a wrong turn), he says this happened a few times, she does not think this ever occurred Uncertain about directions when driving or passenger: Yes per husband, no per patient. Husband reports she forgot how to get to Dr. Inda Merlin' office as well.   The patient is now only doing very minimal driving. She has not had any MVAs in recent years. Her husband recently started helping with medication management/filling her pill planner. She sometimes does forget to take her medication. Her husband has always  managed the bills and finances. She is getting more confused about appointments and asking her husband more about when they are. She states she does quite a bit of cooking still and she denies any problems with this. She does the food shopping but her husband states she  won't remember what she needs without a list (she contends she does fine without a list).   When asked about how she is feeling physically, she states "I guess okay". She denies pain initially but her husband reminds her that she has been complaining of leg pain. She states this is a new issue (right leg/knee). Her husband reports mild unsteadiness. She has not had any falls.   She was diagnosed with kidney cancer in 2016, her left kidney was removed and she had radiation last year, per her husband. She was diagnosed with thyroid cancer in late 2018 and had her thyroid removed. She reports she is now in remission. She has never had chemotherapy. She is a former smoker, quit about 10 years ago.   The patient has a history of depression and has been treated with psychotropic medications over the years. She is currently prescribed Wellbutrin, Buspar, Zoloft and Trazodone. She does not see a mental health professional. Her medications are managed by her PCP. She and her husband report low mood in recent years due to her frustration with her memory difficulty. She has sleep difficulty, usually with sleep maintenance. Some mornings she does not feel rested upon awakening. However, she does not have significant, daily fatigue. Her appetite fluctuates, some days eating only once in a day, other days having 3 meals. She admits her mood is "crummy" and anxious. She reports she always feels stressed because "I never do anything right". Her husband reports that her mood/depression has worsened in the past year or two with increasing memory problems. She admits she rarely experiences happiness anymore. She admits to passive suicidal ideation and occasionally wishing she was no longer alive; however, she denies suicidal intention or plan. She reports family is a protective factor. She denies any history of psychosis or visual illusions/hallucinations.     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  Marital history: Married 32 years, 3 children (one son lives next door, daughter lives not far away), looks to husband for how many grands and then counts up 6 (which he confirms is accurate)  Alcohol: Sometimes, usually couple glasses of wine, never a heavy drinker Tobacco: Quit smoking ~10 years ago SA: None  Medical History:  Past Medical History:  Diagnosis Date  . Allergic rhinitis   . Anxiety state   . Arthritis, degenerative   . Atypical migraine   . Awareness of heartbeats   . Cancer of left kidney (HCC)    Left Kidney  . Cervical radiculitis   . CFIDS (chronic fatigue and immune dysfunction syndrome) ([place])   . Depression, major, single episode, in partial remission ([place])   . Depression, neurotic   . Elevated serum creatinine 03/2016   history of (2.07)  . Endometrial adenocarcinoma ([place])    Stage II grade2  . Essential (primary) hypertension   . Gastric ulcer   . Heart murmur    pt. denies  . HLD (hyperlipidemia)   . Inflammation of hand joint   . Memory difficulties   . Premature complex, ventricular   . Renal disorder   . Renal  mass, left   . Restless leg   . Shortness of breath dyspnea    occasional  . 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 Tremor    L arm  . Vitamin D deficiency     Current medications:  Outpatient Encounter Medications as of 11/22/2017  Medication Sig  . acetaminophen (TYLENOL) 500 MG tablet Take 500-1,000 mg by mouth daily as needed for moderate pain or headache.  Marland Kitchen buPROPion (WELLBUTRIN) 100 MG tablet Take 200 mg by mouth daily.  . busPIRone (BUSPAR) 15 MG tablet Take 15 mg by mouth 2 (two) times daily.  . calcium carbonate (TUMS) 500 MG chewable tablet Chew 2 tablets (400 mg of elemental calcium total) by mouth 2 (two) times daily.  . Cholecalciferol (VITAMIN D) 2000 units tablet Take 2,000 Units by  mouth daily.  Marland Kitchen docusate sodium (COLACE) 100 MG capsule Take 1 capsule (100 mg total) by mouth 2 (two) times daily as needed (take to keep stool soft.). (Patient taking differently: Take 100 mg by mouth daily as needed (take to keep stool soft.). )  . doxylamine, Sleep, (UNISOM) 25 MG tablet Take 25 mg by mouth at bedtime as needed for sleep.  . hydrochlorothiazide (HYDRODIURIL) 25 MG tablet Take 25 mg by mouth daily.  . irbesartan (AVAPRO) 300 MG tablet Take 300 mg daily by mouth.   . levothyroxine (SYNTHROID) 88 MCG tablet Take 1 tablet (88 mcg total) by mouth daily before breakfast.  . Multiple Vitamins-Minerals (MULTIVITAMIN GUMMIES ADULT) CHEW Chew 2 tablets by mouth daily.  . pramipexole (MIRAPEX) 0.5 MG tablet Take 0.5 mg by mouth at bedtime.  . Probiotic Product (PROBIOTIC PO) Take 1 capsule by mouth daily.   Marland Kitchen Propylene Glycol (SYSTANE BALANCE) 0.6 % SOLN Place 1 drop into both eyes daily.  . rosuvastatin (CRESTOR) 10 MG tablet Take 10 mg by mouth at bedtime.   . sertraline (ZOLOFT) 50 MG tablet Take 100 mg by mouth daily.  . traMADol (ULTRAM) 50 MG tablet Take 50-100 mg by mouth 2 (two) times daily as needed for moderate pain.   . traMADol (ULTRAM) 50 MG tablet Take 1-2 tablets (50-100 mg total) by mouth every 6 (six) hours as needed for moderate pain (mild pain not responsive to acetaminophen).  . traZODone (DESYREL) 50 MG tablet Take 50-100 mg by mouth at bedtime as needed for sleep.   . vitamin C (ASCORBIC ACID) 250 MG tablet Take 500 mg by mouth daily.   No facility-administered encounter medications on file as of 11/22/2017.     Notes from patient/husband regarding medication list: Takes mirapex "off and on", not daily Hasn't taken Unisom in a while Tramadol: takes "a couple times a week" Doesn't take trazodone every night, takes 1 if needed, hardly ever takes 2   Current Examination:  Behavioral Observations:  Appearance: Neatly, casually and appropriately dressed and  groomed Gait: Ambulated independently, some unsteadiness due to right knee pain/tightness reported by the patient Speech: Fluent; normal rate, rhythm and volume. Mildly increased response latencies. No significant word finding difficulty. Thought process: Generally linear. Looks to husband for a lot of answers. Affect: Full, dysthymic, anxious Interpersonal: Pleasant, appropriate Orientation: Oriented to person, place, current month and current year. Disoriented to date and off one day on the day of the week. Accurately named the current President but could not name his immediate predecessor.   Tests Administered: . Test of Premorbid Functioning (TOPF) . Wechsler Adult Intelligence Scale-Fourth Edition (WAIS-IV): Similarities, Music therapist, Matrix  Reasoning, Coding and Digit Span subtests . Wechsler Memory Scale-Fourth Edition (WMS-IV) Adult Version (ages 27-69): Logical Memory I, II and Recognition subtests  . [place] Verbal Learning Test - 2nd Edition (CVLT-2) Standard Form . Repeatable Battery for the Assessment of Neuropsychological Status (RBANS) Form A:  Figure Copy and Recall subtests and Semantic Fluency subtest . Boston Naming Test (BNT) . Boston Diagnostic Aphasia Examination: Complex Ideational Material subtest . Controlled Oral Word Association Test (COWAT) . Trail Making Test A and B . Clock drawing test . Beck Depression Inventory - 2nd Edition (BDI-II) . Generalized Anxiety Disorder - 7 item screener (GAD-7)  Test Results: Note: Standardized scores are presented only for use by appropriately trained professionals and to allow for any future test-retest comparison. These scores should not be interpreted without consideration of all the information that is contained in the rest of the report. The most recent standardization samples from the test publisher or other sources were used whenever possible to derive standard scores; scores were corrected for age, gender, ethnicity and  education when available.   Test Scores:  Test Name Raw Score Standardized Score Descriptor  TOPF 47/70 SS= 104 Average  WAIS-IV Subtests     Similarities 16/36 ss= 6 Low average  Block Design 24/66 ss= 7 Low average  Matrix Reasoning 15/26 ss= 10 Average  Coding 35/135 ss= 5 Borderline  Digit Span  22/48 ss= 8 Low end of average  WMS-IV Subtests     LM I 10/50 ss= 3 Impaired  LM II 3/50 ss= 2 Impaired  LM II Recognition 19/30 Cum %: 3-9 Below expectation  RBANS Subtests     Figure Copy 19/20 Z= 0.5 Average  Figure Recall 0/20 Z= -3.4 Severely impaired  Semantic Fluency 13 Z= -1.7 Borderline  CVLT-II Scores     Trial 1 1/16 Z= -3 Severely impaired  Trial 5 5/16 Z= -3 Severely impaired  Trials 1-5 total 16/80 T= 19 Severely impaired  SD Free Recall 1/16 Z= -3 Severely impaired  SD Cued Recall 3/16 Z= -3.5 Severely impaired  LD Free Recall 0/16 Z= -3.5 Severely impaired  LD Cued Recall 3/16 Z= -3 Severely impaired  Recognition Discriminability 12/16 hits 22 false positives Z= -4 Severely impaired  Forced Choice Recognition 14/16  Impaired  BNT 36/60 T= <10 Severely impaired  BDAE Subtest     Complex Ideational Material 9/12  Impaired  COWAT-FAS 25 T= 36 Borderline  COWAT-Animals 9 T= 28 Impaired  Trail Making Test A  86" 0 errors T= 8 Severely impaired  Trail Making Test B  Pt unable     Clock Drawing   Impaired  BDI-II 42/63  Severe  GAD-7 19/21  Severe      Description of Test Results:  Please Note: Performances on embedded validity indicators suggested variable and possibly suboptimal levels of effort/attention. As such, the patient's current performance on neurocognitive testing may not provide a completely valid estimate of her current neurocognitive functioning. True abilities are thought to be of at least the level reported here, and impaired performances cannot be assumed to be reflect neurocognitive deficits.  Premorbid verbal intellectual abilities were  estimated to have been within the average range based on a test of word reading.   Psychomotor processing speed ranged from borderline to severely impaired.   Auditory attention and working memory were average.   Visual-spatial construction was low average to average.   Language abilities were below expectation. Specifically, confrontation naming was severely impaired, and semantic verbal fluency was  borderline (for fruits/vegetables) to impaired (for animals). Auditory comprehension of complex ideational material was below expectation.   With regard to verbal memory, encoding and acquisition of non-contextual information (i.e., word list) was severely impaired across four learning trials. After a brief interference task, free recall was severely impaired (1/16 items). With semantic cueing, she recalled only 2 additional items (severely impaired). After a delay, free recall was severely impaired (0/16 items). Cued recall was severely impaired (3/16 items) and interestingly did not correlate with previous cued recall trial. Performance on a yes/no recognition task was severely impaired with a very high number of false positive errors. Performance on a forced choice recognition task was severely impaired and atypical even for individuals with known neurocognitive disorder such as dementia. On another verbal memory test, encoding and acquisition of contextual auditory information (i.e., short stories) was impaired. After a delay, free recall was impaired. Performance on a yes/no recognition task was below expectation. With regard to non-verbal memory, delayed free recall of visual information was severely impaired (no aspects of the original drawn figure were recalled).   Executive functioning was variable. Mental flexibility and set-shifting were impaired; she was unable to complete even the sample item of Trails B. Verbal fluency with phonemic search restrictions was borderline impaired. Verbal abstract  reasoning was low average. Non-verbal abstract reasoning was average. Performance on a clock drawing task was impaired.   On a self-report measure of mood, the patient's responses were indicative of clinically significant, severe depression at the present time. Symptoms endorsed included: sadness, pessimism, feelings of failure, anhedonia, guilty feelings, punishment feelings, self-dislike, self-criticalness, tearfulness, restlessness, loss of interest, indecisiveness, worthlessness, loss of energy, decreased sleep, irritability, reduced appetite, concentration difficulty, fatigue and loss of libido. She endorsed suicidal ideation but denied intention or plan. On a self-report measure of anxiety, the patient endorsed clinically significant, severe generalized anxiety characterized by nervousness, excessive worry, inability to control worrying, difficulty relaxing, restlessness and irritability on a daily basis.    Clinical Impressions: Major depressive disorder, severe; generalized anxiety disorder, severe.  On formal neurocognitive evaluation, the patient demonstrated significantly impaired performances on tests assessing processing speed, learning/memory for new information, semantic retrieval (confrontation naming, semantic fluency), and executive functioning. However, it should be noted that her performance on an embedded effort measure was atypical even for individuals with dementia. As such, it is difficult to know if current test results reflect true neurocognitive impairment or if there was psychological interference in test performance due to depression and anxiety. Clinical interview, collateral report, and responses on self-report measures all suggested a severe level of depression and generalized anxiety. With this level of depression and anxiety, it is impossible to determine if there is underlying Alzheimer's disease. I cannot rule it out, but it is possible that her mood disorder is contributing  to cognitive difficulties in daily life. As such, at this point, I would recommend psychiatry consultation (she is on several psychotropic medications managed by PCP) and individual therapy.    Recommendations/Plan: Based on the findings of the present evaluation, the following recommendations are offered:  1. More aggressive mental health treatment is highly recommended. She accepted a referral to psychiatry for medication review as well as to [facility] for counseling.  2. She should try to keep a regular schedule/routine to enhance behavioral activation and social engagement, which may optimize cognitive functioning and improve mood. 3. Safe cardiovascular exercise is recommended for brain health and mood as well. 4. Re-evaluation in 1-2 years could certainly  be considered, especially if memory issues are persisting.   Feedback to Patient: Krista Hodges and her husband returned for a feedback appointment on 11/22/2017 to review the results of her neuropsychological evaluation with this provider. 30 minutes face-to-face time was spent reviewing her test results, my impressions and my recommendations as detailed above.    Total time spent on this patient's case: 75 minutes for neurobehavioral status exam with psychologist (CPT code [phone]); 150 minutes of testing/scoring by psychometrician under psychologist's supervision (CPT codes [phone], [phone] units); 220 minutes for integration of patient data, interpretation of standardized test results and clinical data, clinical decision making, treatment planning and preparation of this report, and interactive feedback with review of results to the patient/family by psychologist (CPT codes [phone], [phone] units).      Thank you for your referral of Krista Hodges. Please feel free to contact me if you have any questions or concerns regarding this report.

## 2017-12-05 ENCOUNTER — Ambulatory Visit (INDEPENDENT_AMBULATORY_CARE_PROVIDER_SITE_OTHER): Payer: PRIVATE HEALTH INSURANCE | Admitting: Psychology

## 2017-12-05 DIAGNOSIS — F4323 Adjustment disorder with mixed anxiety and depressed mood: Secondary | ICD-10-CM | POA: Diagnosis not present

## 2018-01-01 ENCOUNTER — Other Ambulatory Visit: Payer: Self-pay | Admitting: Internal Medicine

## 2018-01-01 DIAGNOSIS — Z1231 Encounter for screening mammogram for malignant neoplasm of breast: Secondary | ICD-10-CM

## 2018-01-02 ENCOUNTER — Encounter (HOSPITAL_COMMUNITY): Payer: Self-pay | Admitting: Emergency Medicine

## 2018-01-02 ENCOUNTER — Ambulatory Visit (INDEPENDENT_AMBULATORY_CARE_PROVIDER_SITE_OTHER): Payer: PRIVATE HEALTH INSURANCE | Admitting: Psychology

## 2018-01-02 ENCOUNTER — Emergency Department (HOSPITAL_COMMUNITY)
Admission: EM | Admit: 2018-01-02 | Discharge: 2018-01-02 | Disposition: A | Discharge status: Home / Self Care | Payer: PRIVATE HEALTH INSURANCE | Attending: Emergency Medicine | Admitting: Emergency Medicine

## 2018-01-02 ENCOUNTER — Other Ambulatory Visit: Payer: Self-pay

## 2018-01-02 DIAGNOSIS — S61011A Laceration without foreign body of right thumb without damage to nail, initial encounter: Secondary | ICD-10-CM | POA: Diagnosis present

## 2018-01-02 DIAGNOSIS — I10 Essential (primary) hypertension: Secondary | ICD-10-CM | POA: Insufficient documentation

## 2018-01-02 DIAGNOSIS — Y93G1 Activity, food preparation and clean up: Secondary | ICD-10-CM | POA: Insufficient documentation

## 2018-01-02 DIAGNOSIS — Z79899 Other long term (current) drug therapy: Secondary | ICD-10-CM | POA: Insufficient documentation

## 2018-01-02 DIAGNOSIS — W260XXA Contact with knife, initial encounter: Secondary | ICD-10-CM | POA: Diagnosis not present

## 2018-01-02 DIAGNOSIS — Y929 Unspecified place or not applicable: Secondary | ICD-10-CM | POA: Insufficient documentation

## 2018-01-02 DIAGNOSIS — F331 Major depressive disorder, recurrent, moderate: Secondary | ICD-10-CM

## 2018-01-02 DIAGNOSIS — Y999 Unspecified external cause status: Secondary | ICD-10-CM | POA: Insufficient documentation

## 2018-01-02 DIAGNOSIS — Z87891 Personal history of nicotine dependence: Secondary | ICD-10-CM | POA: Diagnosis not present

## 2018-01-02 MED ORDER — CEPHALEXIN 500 MG PO CAPS: 500.0000 mg | ORAL_CAPSULE | Freq: Two times a day (BID) | ORAL | 0 refills | Status: AC

## 2018-01-02 MED ORDER — LIDOCAINE HCL (PF) 1 % IJ SOLN
20.0000 mL | Freq: Once | INTRAMUSCULAR | Status: AC
  Administered 2018-01-02: 20 mL
  Filled 2018-01-02: qty 30

## 2018-01-02 NOTE — ED Provider Notes (Signed)
[facility] DEPT Provider Note   CSN: [id number] Arrival date & time: 01/02/18  1604     History   Chief Complaint Chief Complaint  Patient presents with  . Laceration    HPI Krista Hodges is a 62 y.o. female presenting for evaluation of thumb laceration.  Patient states just prior to arrival she was using a serrated knife to cut and ice cream cake when she slipped and accidentally cut her right thumb.  She reports minimal to no pain.  She denies numbness or difficulty moving her thumb.  She has not taken anything for pain.  She is not on blood thinners.  Her tetanus is up-to-date.  She denies injury elsewhere. She is left-hand dominant  HPI  Past Medical History:  Diagnosis Date  . Allergic rhinitis   . Anxiety state   . Arthritis, degenerative   . Atypical migraine   . Awareness of heartbeats   . Cancer of left kidney (HCC)    Left Kidney  . Cervical radiculitis   . CFIDS (chronic fatigue and immune dysfunction syndrome) ([place])   . Depression, major, single episode, in partial remission ([place])   . Depression, neurotic   . Elevated serum creatinine 03/2016   history of (2.07)  . Endometrial adenocarcinoma ([place])    Stage II grade2  . Essential (primary) hypertension   . Gastric ulcer   . Heart murmur    pt. denies  . HLD (hyperlipidemia)   . Inflammation of hand joint   . Memory difficulties   . Premature complex, ventricular   . Renal disorder   . Renal mass, left   . Restless leg   . Shortness of breath dyspnea    occasional  . 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 Tremor    L arm  . Vitamin D deficiency     Patient Active Problem List   Diagnosis Date Noted  . Papillary thyroid carcinoma ([place]) 05/05/2017  . Tremor 01/31/2017  . Left renal mass 05/21/2014  . Idiopathic acute pancreatitis   . Urinary tract infectious disease   . Pancreatic mass   . Other acute pancreatitis   . Left kidney mass     . Acute pancreatitis 04/03/2014  . PVC (premature ventricular contraction) 10/20/2013  . Dyspnea 10/20/2013  . Allergic rhinitis 10/16/2013  . Anxiety state 10/16/2013  . Enthesopathy of hip 10/16/2013  . Cervical radiculitis 10/16/2013  . 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 Inflammation of hand joint 10/16/2013  . Atypical migraine 10/16/2013  . Excessive sweating, local 10/16/2013  . HLD (hyperlipidemia) 10/16/2013  . Depression, major, single episode, in partial remission ([place]) 10/16/2013  . Amnesia 10/16/2013  . Adiposity 10/16/2013  . Arthritis, degenerative 10/16/2013  . Restless leg 10/16/2013  . Adaptation reaction 10/16/2013  . Thrombophlebitis of superficial veins of lower extremity 10/16/2013  . Essential (primary) hypertension 10/16/2013  . Avitaminosis D 10/16/2013  . Awareness of heartbeats 09/16/2013  . Premature complex, ventricular 09/16/2013    Past Surgical History:  Procedure Laterality Date  . c sections x 3     . CARPAL TUNNEL RELEASE     rt hand  . LAPAROSCOPIC NEPHRECTOMY Left 05/21/2014   Procedure: LEFT RADICAL LAPAROSCOPIC NEPHRECTOMY;  Surgeon: Ardis Hughs, MD;  Location: [facility];  Service: Urology;  Laterality: Left;  Marland Kitchen MANDIBLE SURGERY    . NEPHRECTOMY TRANSPLANTED ORGAN     Left  kidney removed  . ROBOTIC ASSISTED TOTAL HYSTERECTOMY WITH BILATERAL SALPINGO OOPHERECTOMY  12/27/2015  . THYROIDECTOMY N/A 05/09/2017   Procedure: TOTAL THYROIDECTOMY WITH LIMITED CENTRAL COMPARTMENT LYMPH NODE DISSECTION;  Surgeon: Armandina Gemma, MD;  Location: [facility];  Service: General;  Laterality: N/A;  . TOTAL THYROIDECTOMY     05-09-17 Dr. Harlow Asa     OB History   None      Home Medications    Prior to Admission medications   Medication Sig Start Date End Date Taking? Authorizing Provider  acetaminophen (TYLENOL) 500 MG tablet Take 500-1,000 mg by mouth daily as needed for  moderate pain or headache.    [provider]  buPROPion (WELLBUTRIN) 100 MG tablet Take 200 mg by mouth daily.    [provider]  busPIRone (BUSPAR) 15 MG tablet Take 15 mg by mouth 2 (two) times daily.    [provider]  calcium carbonate (TUMS) 500 MG chewable tablet Chew 2 tablets (400 mg of elemental calcium total) by mouth 2 (two) times daily. 05/10/17   Armandina Gemma, MD  cephALEXin (KEFLEX) 500 MG capsule Take 1 capsule (500 mg total) by mouth 2 (two) times daily for 5 days. 01/02/18 01/07/18  Faithann Natal, PA-C  Cholecalciferol (VITAMIN D) 2000 units tablet Take 2,000 Units by mouth daily.    [provider]  docusate sodium (COLACE) 100 MG capsule Take 1 capsule (100 mg total) by mouth 2 (two) times daily as needed (take to keep stool soft.). Patient taking differently: Take 100 mg by mouth daily as needed (take to keep stool soft.).  05/21/14   Ardis Hughs, MD  doxylamine, Sleep, (UNISOM) 25 MG tablet Take 25 mg by mouth at bedtime as needed for sleep.    [provider]  hydrochlorothiazide (HYDRODIURIL) 25 MG tablet Take 25 mg by mouth daily.    [provider]  irbesartan (AVAPRO) 300 MG tablet Take 300 mg daily by mouth.     [provider]  levothyroxine (SYNTHROID) 88 MCG tablet Take 1 tablet (88 mcg total) by mouth daily before breakfast. 05/10/17   Armandina Gemma, MD  Multiple Vitamins-Minerals (MULTIVITAMIN GUMMIES ADULT) CHEW Chew 2 tablets by mouth daily.    [provider]  pramipexole (MIRAPEX) 0.5 MG tablet Take 0.5 mg by mouth at bedtime.    [provider]  Probiotic Product (PROBIOTIC PO) Take 1 capsule by mouth daily.     [provider]  Propylene Glycol (SYSTANE BALANCE) 0.6 % SOLN Place 1 drop into both eyes daily.    [provider]  rosuvastatin (CRESTOR) 10 MG tablet Take 10 mg by mouth at bedtime.     [provider]  sertraline (ZOLOFT) 50 MG tablet  Take 100 mg by mouth daily.    [provider]  traMADol (ULTRAM) 50 MG tablet Take 50-100 mg by mouth 2 (two) times daily as needed for moderate pain.     [provider]  traMADol (ULTRAM) 50 MG tablet Take 1-2 tablets (50-100 mg total) by mouth every 6 (six) hours as needed for moderate pain (mild pain not responsive to acetaminophen). 05/10/17   Armandina Gemma, MD  traZODone (DESYREL) 50 MG tablet Take 50-100 mg by mouth at bedtime as needed for sleep.     [provider]  vitamin C (ASCORBIC ACID) 250 MG tablet Take 500 mg by mouth daily.    [provider]    Family History Family History  Problem Relation Age of Onset  .  Cancer Mother   . Heart attack Father   . Diabetes Other   . Dementia Neg Hx     Social History Social History   Tobacco Use  . Smoking status: Former Smoker    Packs/day: 1.00    Types: Cigarettes    Last attempt to quit: 05/03/2007    Years since quitting: 10.6  . Smokeless tobacco: Never Used  Substance Use Topics  . Alcohol use: Yes    Comment: occ  . Drug use: No     Allergies   Penicillins; Aspirin; Keppra [levetiracetam]; Nsaids; and Requip [ropinirole hcl]   Review of Systems Review of Systems  Skin: Positive for wound.  Neurological: Negative for numbness.  Hematological: Does not bruise/bleed easily.     Physical Exam Updated Vital Signs BP 113/73 (BP Location: Left Arm)   Pulse 72   Temp 97.9 F (36.6 C) (Oral)   Resp 16   Ht 5\' 5"  (1.651 m)   Wt 72.6 kg   SpO2 99%   BMI 26.63 kg/m   Physical Exam  Constitutional: She is oriented to person, place, and time. She appears well-developed and well-nourished. No distress.  HENT:  Head: Normocephalic and atraumatic.  Eyes: EOM are normal.  Neck: Normal range of motion.  Pulmonary/Chest: Effort normal.  Abdominal: She exhibits no distension.  Musculoskeletal: Normal range of motion.  Sensation of left thumb intact.  Good cap refill.  Full  active range of motion of the finger without difficulty.  Strength against resistance intact.  Neurological: She is alert and oriented to person, place, and time.  Skin: Skin is warm. Capillary refill takes less than 2 seconds. No rash noted.  See picture below.  2 cm laceration at the base of the left thumb without active bleeding.  Laceration is approximately 5 mm deep.  Psychiatric: She has a normal mood and affect.  Nursing note and vitals reviewed.       ED Treatments / Results  Labs (all labs ordered are listed, but only abnormal results are displayed) Labs Reviewed - No data to display  EKG None  Radiology No results found.  Procedures .Marland KitchenLaceration Repair Date/Time: 01/02/2018 5:10 PM Performed by: Franchot Heidelberg, PA-C Authorized by: Franchot Heidelberg, PA-C   Consent:    Consent obtained:  Verbal   Consent given by:  Patient   Risks discussed:  Infection, pain, poor cosmetic result and poor wound healing Anesthesia (see MAR for exact dosages):    Anesthesia method:  Local infiltration   Local anesthetic:  Lidocaine 1% w/o epi Laceration details:    Location:  Finger   Finger location:  R thumb   Length (cm):  2   Depth (mm):  7 Repair type:    Repair type:  Intermediate Pre-procedure details:    Preparation:  Patient was prepped and draped in usual sterile fashion Exploration:    Wound exploration: wound explored through full range of motion and entire depth of wound probed and visualized     Wound extent: no foreign bodies/material noted, no muscle damage noted, no nerve damage noted and no tendon damage noted   Treatment:    Area cleansed with:  Betadine and saline   Amount of cleaning:  Extensive   Irrigation solution:  Sterile water   Irrigation method:  Pressure wash Skin repair:    Repair method:  Sutures   Suture size:  4-0   Suture material:  Prolene   Suture technique:  Simple interrupted   Number of  sutures:  4 Approximation:     Approximation:  Close Post-procedure details:    Dressing:  Splint for protection and bulky dressing   Patient tolerance of procedure:  Tolerated well, no immediate complications   (including critical care time)  Medications Ordered in ED Medications  lidocaine (PF) (XYLOCAINE) 1 % injection 20 mL (has no administration in time range)     Initial Impression / Assessment and Plan / ED Course  I have reviewed the triage vital signs and the nursing notes.  Pertinent labs & imaging results that were available during my care of the patient were reviewed by me and considered in my medical decision making (see chart for details).     Pt presenting for evaluation of finger laceration. Patient states she was cutting an ice cream cake when she accidentally cut her right thumb.  Physical exam reassuring, no neurovascular deficits.  Doubt fracture.  No obvious joint involvement.  Wound cleaned extensively and repaired as described above.  Patient encouraged to follow-up with hand for further evaluation.  Tetanus is up-to-date.  Strict return precautions given.  Wound care instructions given.  At this time, patient appears safe for discharge.  Patient states she understands and agrees plan.   Final Clinical Impressions(s) / ED Diagnoses   Final diagnoses:  Laceration of right thumb without foreign body without damage to nail, initial encounter    ED Discharge Orders         Ordered    cephALEXin (KEFLEX) 500 MG capsule  2 times daily     01/02/18 1712           Terrisha Lopata, PA-C 01/02/18 1717    Pattricia Boss, MD 01/02/18 2352

## 2018-01-02 NOTE — ED Notes (Signed)
Dressing applied to wound.  

## 2018-01-02 NOTE — ED Triage Notes (Signed)
Patient reports laceration to right thumb after cutting it with a knife. Approximately two inch laceration to thumb. Bleeding controlled. Movement and sensation noted to thumb.

## 2018-01-02 NOTE — Discharge Instructions (Signed)
1. Medications: Tylenol or ibuprofen for pain, antibiotics, and usual home medications 2. Treatment: ice for swelling, keep wound clean with warm soap and water  3. Follow Up: Please return in 10 days to have your stitches removed or sooner if you have concerns. Return to the emergency department or follow up with the hand doctor (Dr. Fredna Dow) for increased redness, drainage of pus from the wound   WOUND CARE  Remove bandage and wash wound gently with mild soap and warm water daily. Reapply a new bandage after cleaning wound.   Continue daily cleansing with soap and water until stitches are removed.  Do not apply any ointments or creams to the wound while stitches are in place, as this may cause delayed healing. Return if you experience any of the following signs of infection: Swelling, redness, pus drainage, streaking, fever >101.0 F  Return if you experience excessive bleeding that does not stop after 15-20 minutes of constant, firm pressure.

## 2018-01-22 ENCOUNTER — Ambulatory Visit: Admitting: Psychology

## 2018-02-13 ENCOUNTER — Ambulatory Visit
Admission: RE | Admit: 2018-02-13 | Discharge: 2018-02-13 | Disposition: A | Discharge status: Home / Self Care | Payer: PRIVATE HEALTH INSURANCE | Source: Ambulatory Visit | Attending: Internal Medicine | Admitting: Internal Medicine

## 2018-02-13 DIAGNOSIS — Z1231 Encounter for screening mammogram for malignant neoplasm of breast: Secondary | ICD-10-CM

## 2018-03-10 ENCOUNTER — Ambulatory Visit (INDEPENDENT_AMBULATORY_CARE_PROVIDER_SITE_OTHER): Payer: PRIVATE HEALTH INSURANCE | Admitting: Psychology

## 2018-03-10 DIAGNOSIS — F4323 Adjustment disorder with mixed anxiety and depressed mood: Secondary | ICD-10-CM | POA: Diagnosis not present

## 2018-04-01 ENCOUNTER — Ambulatory Visit (INDEPENDENT_AMBULATORY_CARE_PROVIDER_SITE_OTHER): Payer: PRIVATE HEALTH INSURANCE | Admitting: Psychology

## 2018-04-01 DIAGNOSIS — F331 Major depressive disorder, recurrent, moderate: Secondary | ICD-10-CM | POA: Diagnosis not present

## 2018-04-30 ENCOUNTER — Ambulatory Visit (INDEPENDENT_AMBULATORY_CARE_PROVIDER_SITE_OTHER): Payer: PRIVATE HEALTH INSURANCE | Admitting: Psychology

## 2018-04-30 DIAGNOSIS — F331 Major depressive disorder, recurrent, moderate: Secondary | ICD-10-CM

## 2018-07-23 ENCOUNTER — Ambulatory Visit: Payer: PRIVATE HEALTH INSURANCE | Admitting: Psychology

## 2018-10-13 ENCOUNTER — Other Ambulatory Visit: Payer: Self-pay | Admitting: Internal Medicine

## 2018-10-13 ENCOUNTER — Ambulatory Visit
Admission: RE | Admit: 2018-10-13 | Discharge: 2018-10-13 | Disposition: A | Discharge status: Home / Self Care | Payer: PRIVATE HEALTH INSURANCE | Source: Ambulatory Visit | Attending: Internal Medicine | Admitting: Internal Medicine

## 2018-10-13 DIAGNOSIS — M545 Low back pain, unspecified: Secondary | ICD-10-CM

## 2018-11-12 ENCOUNTER — Institutional Professional Consult (permissible substitution): Payer: PRIVATE HEALTH INSURANCE | Admitting: Neurology

## 2018-12-18 ENCOUNTER — Other Ambulatory Visit: Payer: Self-pay

## 2018-12-18 ENCOUNTER — Ambulatory Visit (INDEPENDENT_AMBULATORY_CARE_PROVIDER_SITE_OTHER): Payer: Medicare Other | Admitting: Neurology

## 2018-12-18 ENCOUNTER — Encounter: Payer: Self-pay | Admitting: Neurology

## 2018-12-18 VITALS — BP 112/67 | HR 59 | Temp 97.4°F | Ht 66.0 in | Wt 152.0 lb

## 2018-12-18 DIAGNOSIS — G969 Disorder of central nervous system, unspecified: Secondary | ICD-10-CM | POA: Diagnosis not present

## 2018-12-18 DIAGNOSIS — R251 Tremor, unspecified: Secondary | ICD-10-CM

## 2018-12-18 DIAGNOSIS — R269 Unspecified abnormalities of gait and mobility: Secondary | ICD-10-CM | POA: Diagnosis not present

## 2018-12-18 DIAGNOSIS — R4189 Other symptoms and signs involving cognitive functions and awareness: Secondary | ICD-10-CM

## 2018-12-18 NOTE — Progress Notes (Signed)
[facility]    Provider:  Dr Jaynee Eagles Referring Provider: Josetta Huddle, MD Primary Care Physician:  Josetta Huddle, MD  HF:[id number] loss, tremors  Interval history: Patient here for a new problem gait abnormality. She has significant psychosocial stressors, depression and has been through cognitive testing in 2019 diagnosed with normal cognitive aging with severe depression and anxiety (however cannot rule out a neurodegenerative condition). I have also seen her for tremors and I asked them to video tape any events as she was completely normal in the office appointment but I never did receive anything to review from them.routine eeg and extended eeg were unremarkable.   She is her with her husband. Her memory is getting worse. She is having gait abnormality, having a difficult time walking. She is still depressed, they have changed her medication, she feels better. But her husband feels it is not just the depression. Repeat formal memory testing.   Husband thinks she has parkinson's disease. No shaking. She is hunched.   Interval history:  Patient is here as a new consult for tremors of the left hand. Patient was just seen last month and evaluated for memory which may be normal cognitive aging with severe depression and anxiety however cannot rule out a neurodegenerative condition. She now has shaking in her left hand. It lasts hours. She is crying in the office today. Just in the left hand. Happens every few days. This is new. Happens when she is having memory lapses. She is alert, no alteration of awareness or loss of consciousness. She says it happened at work for the first time a month ago, no inciting event, she can't stop it, described as a whole arm shaking, lasted hours. The tremor changes directions. Hand and arm flapping motion all the way up the arm. Unknown triggers. She can work when this is going on but can't write. Only the left hand. Husband has been driving. It  appears there is excessive stress, anxiety and depression ongoing. Dr. Inda Merlin placed her on Keppra 250mg  bid. They have not video taped it, I recommended that so I can see an event. Husband provides much information.   Interval history 11 /08/2016: Patient is here for follow-up of memory loss.  Antanasia L Willisis a 63 y.o.femalehere as a referral from Dr. Val Eagle memory loss. Past medical history hypertension, hyperlipidemia, anxiety, dysthymia, seasonal allergies, degenerative joint disease, trochanteric bursitis, situational stress, migraines, vitamin D deficiency,depression, obesity, restless leg syndrome, hand arthritis, cervical radiculopathy at C5, renal cancer left status post left radical affecting the April 2016, total hysterectomy in November 2017, carpal tunnel on the right 2010. She still smokes cigarettes half to 1 pack a day with occasional alcohol.  She is here with husband, she forgets things easy, she has to keep asking things over and over in the same day.  It is getting worse. She has depression, stress, and being forgetful. She goes to bed at different times, it will take an hour to go to sleep and she gets up 3x a night just because, no snoring, extremely fatigued all day, she wakes in the morning with headaches. Here with her husband who also provides information.  Reviewed MRI brain 04/2016: This MRI of the brain without contrast shows the following: 1. Mild generalized cortical atrophy. 2. Few scattered T2/FLAIR hyperintense foci in the hemispheres consistent with age appropriate minimal chronic white vessel ischemic change. 3. Single right frontal microhemorrhage. A single focus is unlikely to be clinically significant.Marland Kitchen  AB:[id number] L Willisis a  63 y.o.femalehere as a referral from Dr. Val Eagle memory loss. Past medical history hypertension, hyperlipidemia, anxiety, dysthymia, seasonal allergies, degenerative joint disease, trochanteric bursitis, situational  stress, migraines, vitamin D deficiency,depression, obesity, restless leg syndrome, hand arthritis, cervical radiculopathy at C5, renal cancer left status post left radical affecting the April 2016, total hysterectomy in November 2017, carpal tunnel on the right 2010. She still smokes cigarettes half to 1 pack a day with occasional alcohol.  She forgets where she puts things or what she is going to say or what she is doing. Started after anesthesia in November. She also feels "off balance". When she was filling out the form this morning she could remember her zip code where she has lived 8-9 years. She has to sit and stare at the computer and can;t remember what button she is supposed to hit. She loses train of thought when talking to someone. She says she forgot how to get to work, she has not been driving since then. She was driving down the highway and she thought she was supposed to turn. Sometimes she is at a stop light and doesn't know where to go. She kept driving that day until she recognized areas and finally made it to work. No accidents in the home, She denies dizziness but when she is walking she feels her balance is "off", no tripping or falling she can;t describe it feels "off". She puts her medications in pill containers and some days forgets to take the medications which is new. She is not sleeping well, her pcp suggested trazodone and a form of melatonin. She tosses and turns a lot. She can sleep 6 hours some night or less. Socially nothing has changed. Not sad or depressed. Tomorrow she is having a PET scan for lymph nodes found so she has this on her plate. She repeats questions and stories. Husband pays the bills. Husband is here and provides all information. She forgets her dogs names. No difficulty remembering peopl close to her.Father's with parkinson's disease. No alzheimer's dementia. No other medication changes. Memory changes stable not worsening.Husband provides much  information.No other focal neurologic deficits, associated symptoms, inciting events or modifiable factors.  Reviewed notes, labs and imaging from outside physicians, which showed:  Reviewed14 pages ofprimary care notes, pertinent history recorded includes:Patient complains of insomnia and also memory loss. Patient had surgery for uterine cancer several months ago and her memory has been worse. Also insomnia has been a problem. She is obese. She also has restless legs. Her primary caretaker physicians referred her to neurology for memory loss possibly due to medication or postanesthesia.  BMP April 2016 was unremarkable except for elevated creatinine 1.34,BUN 17 and GFR 42,CBC showed white blood cells 11.8, otherwise normal.  Review of systems includes positive weight loss, fatigue, memory loss, confusion, headache, insomnia, dizziness, anxiety, depression, change in appetite, not enough sleep otherwise negative.      Social History   Socioeconomic History   Marital status: Married    Spouse name: Tom   Number of children: 3   Years of education: 13   Highest education level: Not on file  Occupational History   Occupation: Editor, commissioning strain: Not on file   Food insecurity    Worry: Not on file    Inability: Not on file   Transportation needs    Medical: Not on file    Non-medical: Not on file  Tobacco Use   Smoking status: Former Smoker  Packs/day: 1.00    Types: Cigarettes    Quit date: 05/03/2007    Years since quitting: 11.6   Smokeless tobacco: Never Used  Substance and Sexual Activity   Alcohol use: Yes    Comment: occ   Drug use: No   Sexual activity: Yes  Lifestyle   Physical activity    Days per week: Not on file    Minutes per session: Not on file   Stress: Not on file  Relationships   Social connections    Talks on phone: Not on file    Gets together: Not on file    Attends religious service: Not  on file    Active member of club or organization: Not on file    Attends meetings of clubs or organizations: Not on file    Relationship status: Not on file   Intimate partner violence    Fear of current or ex partner: Not on file    Emotionally abused: Not on file    Physically abused: Not on file    Forced sexual activity: Not on file  Other Topics Concern   Not on file  Social History Narrative   Lives with husband   Caffeine use: 1 cup coffee per day   Left handed     Family History  Problem Relation Age of Onset   Cancer Mother    Heart attack Father    Diabetes Other    Dementia Neg Hx     Past Medical History:  Diagnosis Date   Allergic rhinitis    Anxiety state    Arthritis, degenerative    Atypical migraine    Awareness of heartbeats    Cancer of left kidney ([place])    Left Kidney   Cervical radiculitis    CFIDS (chronic fatigue and immune dysfunction syndrome) (HCC)    Depression, major, single episode, in partial remission ([place])    Depression, neurotic    Elevated serum creatinine 03/2016   history of (2.07)   Endometrial adenocarcinoma ([place])    Stage II grade2   Essential (primary) hypertension    Gastric ulcer    Heart murmur    pt. denies   HLD (hyperlipidemia)    Inflammation of hand joint    Memory difficulties    Premature complex, ventricular    Renal disorder    Renal mass, left    Restless leg    Shortness of breath dyspnea    occasional   Thrombophlebitis of superficial veins of lower extremity    Tremor    L arm   Vitamin D deficiency     Past Surgical History:  Procedure Laterality Date   c sections x 3      CARPAL TUNNEL RELEASE     rt hand   LAPAROSCOPIC NEPHRECTOMY Left 05/21/2014   Procedure: LEFT RADICAL LAPAROSCOPIC NEPHRECTOMY;  Surgeon: Ardis Hughs, MD;  Location: [facility];  Service: Urology;  Laterality: Left;   MANDIBLE SURGERY     NEPHRECTOMY TRANSPLANTED ORGAN     Left kidney  removed   ROBOTIC ASSISTED TOTAL HYSTERECTOMY WITH BILATERAL SALPINGO OOPHERECTOMY  12/27/2015   THYROIDECTOMY N/A 05/09/2017   Procedure: TOTAL THYROIDECTOMY WITH LIMITED CENTRAL COMPARTMENT LYMPH NODE DISSECTION;  Surgeon: Armandina Gemma, MD;  Location: [facility];  Service: General;  Laterality: N/A;   TOTAL THYROIDECTOMY     05-09-17 Dr. Harlow Asa    Current Outpatient Medications  Medication Sig Dispense Refill   acetaminophen (TYLENOL) 500 MG tablet Take 500-1,000  mg by mouth daily as needed for moderate pain or headache.     buPROPion (WELLBUTRIN) 100 MG tablet Take 200 mg by mouth daily.     busPIRone (BUSPAR) 15 MG tablet Take 15 mg by mouth 2 (two) times daily.     calcium carbonate (TUMS) 500 MG chewable tablet Chew 2 tablets (400 mg of elemental calcium total) by mouth 2 (two) times daily. 90 tablet 1   Cholecalciferol (VITAMIN D) 2000 units tablet Take 2,000 Units by mouth daily.     docusate sodium (COLACE) 100 MG capsule Take 1 capsule (100 mg total) by mouth 2 (two) times daily as needed (take to keep stool soft.). (Patient taking differently: Take 100 mg by mouth daily as needed (take to keep stool soft.). ) 60 capsule 0   doxylamine, Sleep, (UNISOM) 25 MG tablet Take 25 mg by mouth at bedtime as needed for sleep.     hydrochlorothiazide (HYDRODIURIL) 25 MG tablet Take 25 mg by mouth daily.     irbesartan (AVAPRO) 300 MG tablet Take 300 mg daily by mouth.      levothyroxine (SYNTHROID) 88 MCG tablet Take 1 tablet (88 mcg total) by mouth daily before breakfast. 30 tablet 3   Multiple Vitamins-Minerals (MULTIVITAMIN GUMMIES ADULT) CHEW Chew 2 tablets by mouth daily.     pramipexole (MIRAPEX) 0.5 MG tablet Take 0.5 mg by mouth at bedtime.     Probiotic Product (PROBIOTIC PO) Take 1 capsule by mouth daily.      Propylene Glycol (SYSTANE BALANCE) 0.6 % SOLN Place 1 drop into both eyes daily.     rosuvastatin (CRESTOR) 10 MG tablet Take 10 mg by mouth at bedtime.       sertraline (ZOLOFT) 50 MG tablet Take 100 mg by mouth daily.     traMADol (ULTRAM) 50 MG tablet Take 50-100 mg by mouth 2 (two) times daily as needed for moderate pain.      traMADol (ULTRAM) 50 MG tablet Take 1-2 tablets (50-100 mg total) by mouth every 6 (six) hours as needed for moderate pain (mild pain not responsive to acetaminophen). 15 tablet 0   traZODone (DESYREL) 50 MG tablet Take 50-100 mg by mouth at bedtime as needed for sleep.      vitamin C (ASCORBIC ACID) 250 MG tablet Take 500 mg by mouth daily.     No current facility-administered medications for this visit.     Allergies as of 12/18/2018 - Review Complete 12/18/2018  Allergen Reaction Noted   Penicillins Hives 10/20/2013   Aspirin  03/15/2016   Keppra [levetiracetam] Nausea Only 04/25/2017   Nsaids  03/15/2016   Requip [ropinirole hcl]  10/20/2013    Vitals: BP 112/67 (BP Location: Left Arm, Patient Position: Sitting)    Pulse (!) 59    Temp (!) 97.4 F (36.3 C) Comment: husband 97.7; both taken at front door   Ht 5\' 6"  (1.676 m)    Wt 152 lb (68.9 kg)    BMI 24.53 kg/m  Last Weight:  Wt Readings from Last 1 Encounters:  12/18/18 152 lb (68.9 kg)   Last Height:   Ht Readings from Last 1 Encounters:  12/18/18 5\' 6"  (1.676 m)   Physical exam: Exam: Gen: crying, distressed,                 CV: RRR, no MRG. No Carotid Bruits. No peripheral edema, warm, nontender Eyes: Conjunctivae clear without exudates or hemorrhage  Neuro: Detailed Neurologic Exam  Speech:  Speech is normal; fluent and spontaneous with normal comprehension.  Cognition:  MMSE - Mini Mental State Exam 12/18/2018 01/31/2017 12/19/2016  Orientation to time 1 5 4   Orientation to Place 4 4 5   Registration 3 3 3   Attention/ Calculation 2 3 5   Recall 0 1 2  Language- name 2 objects 2 2 2   Language- repeat 0 1 1  Language- follow 3 step command 3 3 3   Language- read & follow direction 1 1 1   Write a sentence 1 1 1   Copy design 0  1 1  Total score 17 25 28     Cranial Nerves:    Flat affect vs hypomimia/masked facies. The pupils are equal, round, and reactive to light. Attempted fundoscopic exam could not visualize. Visual fields are full to finger confrontation. Extraocular movements are intact. Trigeminal sensation is intact and the muscles of mastication are normal. The face is symmetric. The palate elevates in the midline. Hearing intact. Voice is normal. Shoulder shrug is normal. The tongue has normal motion without fasciculations.   Coordination: No Dysmetria  Gait: Bradykinetic, decresed arm swing.  Motor Observation: can independently rise out of a chair. No asymmetry, no atrophy. Postural tremor   Minimal postural tremor, no resting tremor.   Tone:    posisble some increased tone with right arm but difficult for her to relax  Posture:    some flexion at the wais she has significant low back pain.    Strength:    Strength is V/V in the upper and lower limbs.      Sensation: intact to LT     Reflex Exam:  DTR's:    Deep tendon reflexes in the upper and lower extremities are normal bilaterally.   Toes:    The toes are downgoing bilaterally.   Clonus:    Clonus is absent.   Assessment/Plan:63 year old with significant depression and psychosocial stressors, here for gait abnormality. I have seen her in the past and each time she has been upset and crying, multiple complaints in the past that I believe were functional or somatization, neurocognitive testing more in line with psychiatric causes of years of cognitive complaints. Today she is here with husband, distressed and teary. She does have some findings on exam, some bradykinesia, decreased arm swing, flat affect vs masked facies. Given her prior history as above I would not want to interpret this as parkinsonian without a +DAT scan. She also may have a history of dopamine blocking drugs given her years of depression but husband denies and he  appears to be a reliable historian. But cannot rule out early parkinson's disease, her father had PD and Alzheimer's per patient report, she does also exhibit tremors. Needs thorough evaluation.  -  DAT scan to differentiate essential tremor vs parkinson's disease spectrum - Her Epworth Sleepiness Scale was 10, she wakes multiple times throughout the night, no snoring but does feel fatigued and does not get restful sleep recommended sleep study. Patient declined. May consider in the future.  - continues follow up for significant psychosocial impairments - We will also send a referral to Dr. Sima Matas, would like to have those results prior to repeat memory tesing  Orders Placed This Encounter  Procedures   [place] LOC INFLAM SPECT 1 DAY   Ambulatory referral to Neuropsychology     A total of 70 minutes was spent face-to-face with this patient. Over half this time was spent on counseling patient on the  1.  Tremor due to disorder of central nervous system   2. Gait abnormality   3. Cognitive change    diagnosis and different diagnostic and therapeutic options, counseling and coordination of care, risks ans benefits of management, compliance, or risk factor reduction and education.    Sarina Ill, MD  [facility] [address] [place], [place] [address]  Phone [phone] Fax [phone]

## 2018-12-18 NOTE — Patient Instructions (Addendum)
DaT Scan Formal memory testing   Parkinson's Disease Parkinson's disease is a type of movement disorder. It is a long-term condition that gets worse over time (is progressive). Each person with Parkinson's disease is affected differently. This condition limits your ability to control movements and move your body normally. The condition can range from mild to severe. Parkinson's disease tends to get worse slowly over several years. What are the causes? Parkinson's disease results from a loss of brain cells (neurons) that make a brain chemical called dopamine. Dopamine is needed to control movement. As the condition gets worse, neurons make less dopamine. This makes it hard to move or control your movements. The exact cause of the loss of neurons and why they make less dopamine is not known. Factors related to genes and the environment may contribute to the cause of Parkinson's disease. What increases the risk? The following factors may make you more likely to develop this condition:  Being female.  Being age 58 or older.  Having a family history of Parkinson's disease.  Having had a traumatic brain injury.  Having experienced depression.  Having been exposed to toxins, such as pesticides. What are the signs or symptoms? Symptoms of this condition can vary. The main symptoms are related to movement. These include:  A tremor or shaking while you are resting that you cannot control.  Stiffness in your neck, arms, and legs (rigidity).  Slowing of movement. You may lose facial expressions and have trouble making small movements that are needed to button clothing or brush your teeth.  An abnormal walk. You may walk with short, shuffling steps.  Loss of balance and stability when standing. You may sway, fall backward, and have trouble making turns. Other symptoms include:  Mental or cognitive changes including depression, anxiety, having false beliefs (delusions), or seeing, hearing, or  feeling things that do not exist (hallucinations).  Trouble speaking or swallowing.  Changes in bowel or bladder functions including constipation, having to go urgently or frequently, or not being able to control your bowel or bladder.  Changes in sleep habits or trouble sleeping. Parkinson's disease may be graded by severity of your condition as mild, moderate, or advanced. Parkinson's disease progression is different for everyone. You may not progress to the advanced stage.  Mild Parkinson's disease involves: ? Movement problems that do not affect daily activities. ? Movement problems on one side of the body.  Moderate Parkinson's disease involves: ? Movement problems on both sides of the body. ? Slowing of movement. ? Coordination and balance problems.  Advanced Parkinson's disease involves: ? Extreme difficulty walking. ? Inability to live alone safely. ? Signs of dementia, such as having trouble remembering things, doing daily tasks such as getting dressed, and problem solving. How is this diagnosed? This condition is diagnosed by a specialist. A diagnosis may be made based on symptoms, your medical history, and a physical exam. You may also have brain imaging tests to check for a loss of dopamine-producing areas of the brain. How is this treated? There is no cure for Parkinson's disease. Treatment focuses on managing your symptoms. Treatment may include:  Medicines. Everyone responds to medicines differently. Your response may change over time. Work with your health care provider to find the best medicines for you.  Speech, occupational, and physical therapy.  Deep brain stimulation surgery to reduce tremors and other involuntary movements. Follow these instructions at home: Medicines  Take over-the-counter and prescription medicines only as told by your health care provider.  Avoid taking medicines that can affect thinking, such as pain or sleeping medicines. Eating and  drinking  Follow instructions from your health care provider about eating or drinking restrictions.  Do not drink alcohol. Activity  Talk with your health care provider about if it is safe for you to drive.  Do exercises as told by your health care provider or physical therapist. Lifestyle      Install grab bars and railings in your home to prevent falls.  Do not use any products that contain nicotine or tobacco, such as cigarettes, e-cigarettes, and chewing tobacco. If you need help quitting, ask your health care provider.  Consider joining a support group for people with Parkinson's disease. General instructions  Work with your health care provider to determine what you need help with and what your safety needs are.  Keep all follow-up visits as told by your health care provider, including any visits with a physical therapist, speech therapist, or occupational therapist. This is important. Contact a health care provider if:  Medicines do not help your symptoms.  You are unsteady or have fallen at home.  You need more support to function well at home.  You have trouble swallowing.  You have severe constipation.  You are having problems with side effects from your medicines.  You feel confused, anxious, or depressed. Get help right away if you:  Are injured after a fall.  See or hear things that are not real.  Cannot swallow without choking.  Have chest pain or trouble breathing.  Do not feel safe at home.  Have thoughts about hurting yourself or others. If you ever feel like you may hurt yourself or others, or have thoughts about taking your own life, get help right away. You can go to your nearest emergency department or call:  Your local emergency services (911 in the [place]  A suicide crisis helpline, such as the [facility] at (684)224-0419. This is open 24 hours a day. Summary  Parkinson's disease is a long-term condition  that gets worse over time. This condition limits your ability to control your movements and move your body normally.  There is no cure for Parkinson's disease. Treatment focuses on managing your symptoms.  Work with your health care provider to determine what you need help with and what your safety needs are.  Keep all follow-up visits as told by your health care provider, including any visits with a physical therapist, speech therapist, or occupational therapist. This is important. This information is not intended to replace advice given to you by your health care provider. Make sure you discuss any questions you have with your health care provider. Document Released: 01/27/2000 Document Revised: 04/17/2018 Document Reviewed: 04/17/2018 Elsevier Patient Education  2020 [employer].

## 2019-01-01 ENCOUNTER — Other Ambulatory Visit: Payer: Self-pay | Admitting: Internal Medicine

## 2019-01-01 DIAGNOSIS — Z1231 Encounter for screening mammogram for malignant neoplasm of breast: Secondary | ICD-10-CM

## 2019-01-19 ENCOUNTER — Ambulatory Visit (INDEPENDENT_AMBULATORY_CARE_PROVIDER_SITE_OTHER): Payer: Medicare Other | Admitting: Psychology

## 2019-01-19 DIAGNOSIS — F331 Major depressive disorder, recurrent, moderate: Secondary | ICD-10-CM

## 2019-01-21 ENCOUNTER — Other Ambulatory Visit: Payer: Self-pay | Admitting: Internal Medicine

## 2019-01-21 DIAGNOSIS — N63 Unspecified lump in unspecified breast: Secondary | ICD-10-CM

## 2019-01-28 ENCOUNTER — Encounter (HOSPITAL_COMMUNITY)
Admission: RE | Admit: 2019-01-28 | Discharge: 2019-01-28 | Disposition: A | Discharge status: Home / Self Care | Payer: Medicare Other | Source: Ambulatory Visit | Attending: Neurology | Admitting: Neurology

## 2019-01-28 ENCOUNTER — Ambulatory Visit (HOSPITAL_COMMUNITY)
Admission: RE | Admit: 2019-01-28 | Discharge: 2019-01-28 | Disposition: A | Discharge status: Home / Self Care | Payer: Medicare Other | Source: Ambulatory Visit | Attending: Neurology | Admitting: Neurology

## 2019-01-28 ENCOUNTER — Other Ambulatory Visit: Payer: Self-pay

## 2019-01-28 DIAGNOSIS — R251 Tremor, unspecified: Secondary | ICD-10-CM | POA: Diagnosis present

## 2019-01-28 DIAGNOSIS — G969 Disorder of central nervous system, unspecified: Secondary | ICD-10-CM | POA: Diagnosis not present

## 2019-01-28 MED ORDER — IOFLUPANE I 123 185 MBQ/2.5ML IV SOLN
4.6000 | Freq: Once | INTRAVENOUS | Status: AC | PRN
  Administered 2019-01-28: 4.6 via INTRAVENOUS
  Filled 2019-01-28: qty 5

## 2019-01-28 MED ORDER — IODINE STRONG (LUGOLS) 5 % PO SOLN
0.8000 mL | Freq: Once | ORAL | Status: AC
  Administered 2019-01-28: 0.8 mL via ORAL

## 2019-01-28 MED ORDER — IODINE STRONG (LUGOLS) 5 % PO SOLN
ORAL | Status: AC
  Filled 2019-01-28: qty 1

## 2019-02-03 ENCOUNTER — Other Ambulatory Visit: Payer: Self-pay

## 2019-02-03 ENCOUNTER — Ambulatory Visit: Payer: Medicare Other | Admitting: Psychology

## 2019-02-03 ENCOUNTER — Encounter (HOSPITAL_COMMUNITY): Payer: Self-pay | Admitting: Emergency Medicine

## 2019-02-03 ENCOUNTER — Emergency Department (HOSPITAL_COMMUNITY): Payer: Medicare Other

## 2019-02-03 ENCOUNTER — Inpatient Hospital Stay (HOSPITAL_COMMUNITY)
Admission: EM | Admit: 2019-02-03 | Discharge: 2019-02-16 | DRG: 025 | Disposition: A | Discharge status: Rehabilitation Facility | Payer: Medicare Other | Attending: Neurosurgery | Admitting: Neurosurgery

## 2019-02-03 DIAGNOSIS — Z79890 Hormone replacement therapy (postmenopausal): Secondary | ICD-10-CM

## 2019-02-03 DIAGNOSIS — Z9181 History of falling: Secondary | ICD-10-CM

## 2019-02-03 DIAGNOSIS — S32038A Other fracture of third lumbar vertebra, initial encounter for closed fracture: Secondary | ICD-10-CM | POA: Diagnosis present

## 2019-02-03 DIAGNOSIS — Z79899 Other long term (current) drug therapy: Secondary | ICD-10-CM | POA: Diagnosis not present

## 2019-02-03 DIAGNOSIS — R918 Other nonspecific abnormal finding of lung field: Secondary | ICD-10-CM | POA: Diagnosis present

## 2019-02-03 DIAGNOSIS — C7931 Secondary malignant neoplasm of brain: Secondary | ICD-10-CM | POA: Diagnosis present

## 2019-02-03 DIAGNOSIS — Z905 Acquired absence of kidney: Secondary | ICD-10-CM | POA: Diagnosis not present

## 2019-02-03 DIAGNOSIS — M549 Dorsalgia, unspecified: Secondary | ICD-10-CM | POA: Diagnosis present

## 2019-02-03 DIAGNOSIS — F0390 Unspecified dementia without behavioral disturbance: Secondary | ICD-10-CM | POA: Diagnosis present

## 2019-02-03 DIAGNOSIS — I495 Sick sinus syndrome: Secondary | ICD-10-CM | POA: Diagnosis not present

## 2019-02-03 DIAGNOSIS — Z85528 Personal history of other malignant neoplasm of kidney: Secondary | ICD-10-CM | POA: Diagnosis not present

## 2019-02-03 DIAGNOSIS — E890 Postprocedural hypothyroidism: Secondary | ICD-10-CM | POA: Diagnosis present

## 2019-02-03 DIAGNOSIS — D72829 Elevated white blood cell count, unspecified: Secondary | ICD-10-CM

## 2019-02-03 DIAGNOSIS — E034 Atrophy of thyroid (acquired): Secondary | ICD-10-CM | POA: Diagnosis not present

## 2019-02-03 DIAGNOSIS — Z8249 Family history of ischemic heart disease and other diseases of the circulatory system: Secondary | ICD-10-CM | POA: Diagnosis not present

## 2019-02-03 DIAGNOSIS — N183 Chronic kidney disease, stage 3 unspecified: Secondary | ICD-10-CM | POA: Diagnosis not present

## 2019-02-03 DIAGNOSIS — G914 Hydrocephalus in diseases classified elsewhere: Secondary | ICD-10-CM | POA: Diagnosis present

## 2019-02-03 DIAGNOSIS — R001 Bradycardia, unspecified: Secondary | ICD-10-CM | POA: Diagnosis present

## 2019-02-03 DIAGNOSIS — K769 Liver disease, unspecified: Secondary | ICD-10-CM | POA: Diagnosis present

## 2019-02-03 DIAGNOSIS — K5901 Slow transit constipation: Secondary | ICD-10-CM | POA: Diagnosis not present

## 2019-02-03 DIAGNOSIS — C541 Malignant neoplasm of endometrium: Secondary | ICD-10-CM

## 2019-02-03 DIAGNOSIS — R5382 Chronic fatigue, unspecified: Secondary | ICD-10-CM | POA: Diagnosis not present

## 2019-02-03 DIAGNOSIS — W19XXXA Unspecified fall, initial encounter: Secondary | ICD-10-CM | POA: Diagnosis present

## 2019-02-03 DIAGNOSIS — S22080A Wedge compression fracture of T11-T12 vertebra, initial encounter for closed fracture: Secondary | ICD-10-CM | POA: Diagnosis present

## 2019-02-03 DIAGNOSIS — N6310 Unspecified lump in the right breast, unspecified quadrant: Secondary | ICD-10-CM | POA: Diagnosis present

## 2019-02-03 DIAGNOSIS — Z515 Encounter for palliative care: Secondary | ICD-10-CM | POA: Diagnosis not present

## 2019-02-03 DIAGNOSIS — D649 Anemia, unspecified: Secondary | ICD-10-CM | POA: Diagnosis present

## 2019-02-03 DIAGNOSIS — G936 Cerebral edema: Secondary | ICD-10-CM | POA: Diagnosis present

## 2019-02-03 DIAGNOSIS — E785 Hyperlipidemia, unspecified: Secondary | ICD-10-CM | POA: Diagnosis present

## 2019-02-03 DIAGNOSIS — Z66 Do not resuscitate: Secondary | ICD-10-CM | POA: Diagnosis not present

## 2019-02-03 DIAGNOSIS — Z87898 Personal history of other specified conditions: Secondary | ICD-10-CM | POA: Diagnosis not present

## 2019-02-03 DIAGNOSIS — G96198 Other disorders of meninges, not elsewhere classified: Secondary | ICD-10-CM | POA: Diagnosis present

## 2019-02-03 DIAGNOSIS — R296 Repeated falls: Secondary | ICD-10-CM | POA: Diagnosis present

## 2019-02-03 DIAGNOSIS — Z87891 Personal history of nicotine dependence: Secondary | ICD-10-CM | POA: Diagnosis not present

## 2019-02-03 DIAGNOSIS — Z8672 Personal history of thrombophlebitis: Secondary | ICD-10-CM

## 2019-02-03 DIAGNOSIS — Z20822 Contact with and (suspected) exposure to covid-19: Secondary | ICD-10-CM | POA: Diagnosis present

## 2019-02-03 DIAGNOSIS — Z880 Allergy status to penicillin: Secondary | ICD-10-CM

## 2019-02-03 DIAGNOSIS — I10 Essential (primary) hypertension: Secondary | ICD-10-CM | POA: Diagnosis not present

## 2019-02-03 DIAGNOSIS — R531 Weakness: Secondary | ICD-10-CM

## 2019-02-03 DIAGNOSIS — Z90710 Acquired absence of both cervix and uterus: Secondary | ICD-10-CM

## 2019-02-03 DIAGNOSIS — Z923 Personal history of irradiation: Secondary | ICD-10-CM | POA: Diagnosis not present

## 2019-02-03 DIAGNOSIS — F329 Major depressive disorder, single episode, unspecified: Secondary | ICD-10-CM | POA: Diagnosis present

## 2019-02-03 DIAGNOSIS — E039 Hypothyroidism, unspecified: Secondary | ICD-10-CM | POA: Diagnosis present

## 2019-02-03 DIAGNOSIS — Z8542 Personal history of malignant neoplasm of other parts of uterus: Secondary | ICD-10-CM

## 2019-02-03 DIAGNOSIS — D496 Neoplasm of unspecified behavior of brain: Secondary | ICD-10-CM | POA: Diagnosis present

## 2019-02-03 DIAGNOSIS — Z809 Family history of malignant neoplasm, unspecified: Secondary | ICD-10-CM

## 2019-02-03 DIAGNOSIS — D72828 Other elevated white blood cell count: Secondary | ICD-10-CM | POA: Diagnosis not present

## 2019-02-03 DIAGNOSIS — R739 Hyperglycemia, unspecified: Secondary | ICD-10-CM | POA: Diagnosis present

## 2019-02-03 DIAGNOSIS — E279 Disorder of adrenal gland, unspecified: Secondary | ICD-10-CM | POA: Diagnosis present

## 2019-02-03 DIAGNOSIS — K5900 Constipation, unspecified: Secondary | ICD-10-CM | POA: Diagnosis present

## 2019-02-03 DIAGNOSIS — E876 Hypokalemia: Secondary | ICD-10-CM | POA: Diagnosis present

## 2019-02-03 DIAGNOSIS — R590 Localized enlarged lymph nodes: Secondary | ICD-10-CM | POA: Diagnosis present

## 2019-02-03 DIAGNOSIS — E559 Vitamin D deficiency, unspecified: Secondary | ICD-10-CM | POA: Diagnosis present

## 2019-02-03 DIAGNOSIS — R2689 Other abnormalities of gait and mobility: Secondary | ICD-10-CM | POA: Diagnosis not present

## 2019-02-03 DIAGNOSIS — F0280 Dementia in other diseases classified elsewhere without behavioral disturbance: Secondary | ICD-10-CM | POA: Diagnosis not present

## 2019-02-03 DIAGNOSIS — Z85850 Personal history of malignant neoplasm of thyroid: Secondary | ICD-10-CM

## 2019-02-03 DIAGNOSIS — G911 Obstructive hydrocephalus: Secondary | ICD-10-CM | POA: Diagnosis not present

## 2019-02-03 LAB — CBC WITH DIFFERENTIAL/PLATELET
Abs Immature Granulocytes: 0.12 10*3/uL — ABNORMAL HIGH (ref 0.00–0.07)
Basophils Absolute: 0 10*3/uL (ref 0.0–0.1)
Basophils Relative: 0 %
Eosinophils Absolute: 0 10*3/uL (ref 0.0–0.5)
Eosinophils Relative: 0 %
HCT: 34.4 % — ABNORMAL LOW (ref 36.0–46.0)
Hemoglobin: 11 g/dL — ABNORMAL LOW (ref 12.0–15.0)
Immature Granulocytes: 1 %
Lymphocytes Relative: 2 %
Lymphs Abs: 0.3 10*3/uL — ABNORMAL LOW (ref 0.7–4.0)
MCH: 30.7 pg (ref 26.0–34.0)
MCHC: 32 g/dL (ref 30.0–36.0)
MCV: 96.1 fL (ref 80.0–100.0)
Monocytes Absolute: 0.7 10*3/uL (ref 0.1–1.0)
Monocytes Relative: 6 %
Neutro Abs: 11.9 10*3/uL — ABNORMAL HIGH (ref 1.7–7.7)
Neutrophils Relative %: 91 %
Platelets: 332 10*3/uL (ref 150–400)
RBC: 3.58 MIL/uL — ABNORMAL LOW (ref 3.87–5.11)
RDW: 12.7 % (ref 11.5–15.5)
WBC: 13 10*3/uL — ABNORMAL HIGH (ref 4.0–10.5)
nRBC: 0 % (ref 0.0–0.2)

## 2019-02-03 LAB — URINALYSIS, ROUTINE W REFLEX MICROSCOPIC
Bacteria, UA: NONE SEEN
Bilirubin Urine: NEGATIVE
Glucose, UA: NEGATIVE mg/dL
Hgb urine dipstick: NEGATIVE
Ketones, ur: NEGATIVE mg/dL
Nitrite: NEGATIVE
Protein, ur: NEGATIVE mg/dL
Specific Gravity, Urine: >1.046 — ABNORMAL HIGH (ref 1.005–1.030)
pH: 7 (ref 5.0–8.0)

## 2019-02-03 LAB — COMPREHENSIVE METABOLIC PANEL
ALT: 12 U/L (ref 0–44)
AST: 16 U/L (ref 15–41)
Albumin: 3.2 g/dL — ABNORMAL LOW (ref 3.5–5.0)
Alkaline Phosphatase: 103 U/L (ref 38–126)
Anion gap: 11 (ref 5–15)
BUN: 18 mg/dL (ref 8–23)
CO2: 26 mmol/L (ref 22–32)
Calcium: 9.5 mg/dL (ref 8.9–10.3)
Chloride: 98 mmol/L (ref 98–111)
Creatinine, Ser: 1.04 mg/dL — ABNORMAL HIGH (ref 0.44–1.00)
GFR calc Af Amer: >60 mL/min (ref >60)
GFR calc non Af Amer: 56 mL/min — ABNORMAL LOW (ref >60)
Glucose, Bld: 154 mg/dL — ABNORMAL HIGH (ref 70–99)
Potassium: 2.7 mmol/L — CL (ref 3.5–5.1)
Sodium: 135 mmol/L (ref 135–145)
Total Bilirubin: 0.5 mg/dL (ref 0.3–1.2)
Total Protein: 6.3 g/dL — ABNORMAL LOW (ref 6.5–8.1)

## 2019-02-03 LAB — CBG MONITORING, ED: Glucose-Capillary: 165 mg/dL — ABNORMAL HIGH (ref 70–99)

## 2019-02-03 LAB — CK: Total CK: 46 U/L (ref 38–234)

## 2019-02-03 LAB — PROTIME-INR
INR: 1 (ref 0.8–1.2)
Prothrombin Time: 13.3 seconds (ref 11.4–15.2)

## 2019-02-03 LAB — APTT: aPTT: 30 seconds (ref 24–36)

## 2019-02-03 LAB — RESPIRATORY PANEL BY RT PCR (FLU A&B, COVID)
Influenza A by PCR: NEGATIVE
Influenza B by PCR: NEGATIVE
SARS Coronavirus 2 by RT PCR: NEGATIVE

## 2019-02-03 LAB — T4, FREE: Free T4: 0.45 ng/dL — ABNORMAL LOW (ref 0.61–1.12)

## 2019-02-03 LAB — HIV ANTIBODY (ROUTINE TESTING W REFLEX): HIV Screen 4th Generation wRfx: NONREACTIVE

## 2019-02-03 LAB — TSH: TSH: 75.666 u[IU]/mL — ABNORMAL HIGH (ref 0.350–4.500)

## 2019-02-03 MED ORDER — BUSPIRONE HCL 5 MG PO TABS
15.0000 mg | ORAL_TABLET | Freq: Two times a day (BID) | ORAL | Status: DC
  Administered 2019-02-03 – 2019-02-16 (×24): 15 mg via ORAL
  Filled 2019-02-03: qty 3
  Filled 2019-02-03 (×2): qty 1
  Filled 2019-02-03: qty 3
  Filled 2019-02-03 (×2): qty 1
  Filled 2019-02-03 (×3): qty 3
  Filled 2019-02-03: qty 2
  Filled 2019-02-03 (×6): qty 3
  Filled 2019-02-03: qty 1
  Filled 2019-02-03 (×7): qty 3

## 2019-02-03 MED ORDER — BISACODYL 10 MG RE SUPP
10.0000 mg | Freq: Every day | RECTAL | Status: DC | PRN
  Administered 2019-02-09: 10:00:00 10 mg via RECTAL
  Filled 2019-02-03: qty 1

## 2019-02-03 MED ORDER — PRAMIPEXOLE DIHYDROCHLORIDE 0.25 MG PO TABS
0.5000 mg | ORAL_TABLET | Freq: Every day | ORAL | Status: DC
  Administered 2019-02-04 – 2019-02-15 (×13): 0.5 mg via ORAL
  Filled 2019-02-03 (×14): qty 2

## 2019-02-03 MED ORDER — ONDANSETRON HCL 4 MG PO TABS: 4.0000 mg | ORAL_TABLET | Freq: Four times a day (QID) | ORAL | Status: DC | PRN

## 2019-02-03 MED ORDER — LEVOTHYROXINE SODIUM 88 MCG PO TABS
88.0000 ug | ORAL_TABLET | Freq: Every day | ORAL | Status: DC
  Administered 2019-02-05 – 2019-02-06 (×2): 88 ug via ORAL
  Filled 2019-02-03 (×3): qty 1

## 2019-02-03 MED ORDER — BUPROPION HCL 100 MG PO TABS
200.0000 mg | ORAL_TABLET | Freq: Every day | ORAL | Status: DC
  Administered 2019-02-05 – 2019-02-16 (×12): 200 mg via ORAL
  Filled 2019-02-03 (×14): qty 2

## 2019-02-03 MED ORDER — POTASSIUM CHLORIDE 20 MEQ PO PACK
40.0000 meq | PACK | Freq: Once | ORAL | Status: AC
  Administered 2019-02-03: 21:00:00 40 meq via ORAL
  Filled 2019-02-03: qty 2

## 2019-02-03 MED ORDER — DOXYLAMINE SUCCINATE (SLEEP) 25 MG PO TABS
25.0000 mg | ORAL_TABLET | Freq: Every evening | ORAL | Status: DC | PRN
  Administered 2019-02-04: 25 mg via ORAL
  Filled 2019-02-03: qty 1

## 2019-02-03 MED ORDER — DEXAMETHASONE SODIUM PHOSPHATE 4 MG/ML IJ SOLN
4.0000 mg | Freq: Four times a day (QID) | INTRAMUSCULAR | Status: DC
  Administered 2019-02-04 – 2019-02-06 (×7): 4 mg via INTRAVENOUS
  Filled 2019-02-03 (×7): qty 1

## 2019-02-03 MED ORDER — LACTATED RINGERS IV BOLUS
1000.0000 mL | Freq: Once | INTRAVENOUS | Status: AC
  Administered 2019-02-03: 19:00:00 1000 mL via INTRAVENOUS

## 2019-02-03 MED ORDER — ONDANSETRON HCL 4 MG/2ML IJ SOLN: 4.0000 mg | Freq: Four times a day (QID) | INTRAMUSCULAR | Status: DC | PRN

## 2019-02-03 MED ORDER — DOCUSATE SODIUM 100 MG PO CAPS
100.0000 mg | ORAL_CAPSULE | Freq: Two times a day (BID) | ORAL | Status: DC
  Administered 2019-02-03 – 2019-02-16 (×22): 100 mg via ORAL
  Filled 2019-02-03 (×24): qty 1

## 2019-02-03 MED ORDER — OXYCODONE HCL 5 MG PO TABS
5.0000 mg | ORAL_TABLET | ORAL | Status: DC | PRN
  Administered 2019-02-07 – 2019-02-11 (×3): 5 mg via ORAL
  Filled 2019-02-03 (×4): qty 1

## 2019-02-03 MED ORDER — LEVETIRACETAM IN NACL 500 MG/100ML IV SOLN
500.0000 mg | Freq: Two times a day (BID) | INTRAVENOUS | Status: AC
  Administered 2019-02-04 – 2019-02-10 (×13): 500 mg via INTRAVENOUS
  Filled 2019-02-03 (×15): qty 100

## 2019-02-03 MED ORDER — POLYETHYLENE GLYCOL 3350 17 G PO PACK
17.0000 g | PACK | Freq: Every day | ORAL | Status: DC | PRN
  Administered 2019-02-09: 06:00:00 17 g via ORAL
  Filled 2019-02-03: qty 1

## 2019-02-03 MED ORDER — POTASSIUM CHLORIDE 10 MEQ/100ML IV SOLN
10.0000 meq | Freq: Once | INTRAVENOUS | Status: AC
  Administered 2019-02-03: 10 meq via INTRAVENOUS
  Filled 2019-02-03: qty 100

## 2019-02-03 MED ORDER — SERTRALINE HCL 100 MG PO TABS
100.0000 mg | ORAL_TABLET | Freq: Every day | ORAL | Status: DC
  Administered 2019-02-05 – 2019-02-16 (×12): 100 mg via ORAL
  Filled 2019-02-03 (×9): qty 1
  Filled 2019-02-03: qty 2
  Filled 2019-02-03 (×2): qty 1
  Filled 2019-02-03: qty 2

## 2019-02-03 MED ORDER — HYDROCHLOROTHIAZIDE 25 MG PO TABS
25.0000 mg | ORAL_TABLET | Freq: Every day | ORAL | Status: DC
  Administered 2019-02-03: 22:00:00 25 mg via ORAL

## 2019-02-03 MED ORDER — SODIUM CHLORIDE 0.9 % IV SOLN: INTRAVENOUS | Status: DC

## 2019-02-03 MED ORDER — ROSUVASTATIN CALCIUM 5 MG PO TABS
10.0000 mg | ORAL_TABLET | Freq: Every day | ORAL | Status: DC
  Administered 2019-02-03 – 2019-02-15 (×13): 10 mg via ORAL
  Filled 2019-02-03 (×13): qty 2

## 2019-02-03 MED ORDER — IRBESARTAN 300 MG PO TABS
300.0000 mg | ORAL_TABLET | Freq: Every day | ORAL | Status: DC
  Administered 2019-02-04 – 2019-02-16 (×12): 300 mg via ORAL
  Filled 2019-02-03 (×4): qty 1
  Filled 2019-02-03: qty 2
  Filled 2019-02-03: qty 1
  Filled 2019-02-03: qty 2
  Filled 2019-02-03 (×7): qty 1

## 2019-02-03 MED ORDER — DEXAMETHASONE SODIUM PHOSPHATE 10 MG/ML IJ SOLN
10.0000 mg | Freq: Once | INTRAMUSCULAR | Status: AC
  Administered 2019-02-03: 22:00:00 10 mg via INTRAVENOUS
  Filled 2019-02-03: qty 1

## 2019-02-03 MED ORDER — TRAZODONE HCL 100 MG PO TABS
100.0000 mg | ORAL_TABLET | Freq: Every evening | ORAL | Status: DC | PRN
  Administered 2019-02-07: 22:00:00 100 mg via ORAL
  Filled 2019-02-03: qty 1

## 2019-02-03 MED ORDER — ACETAMINOPHEN 325 MG PO TABS: 650.0000 mg | ORAL_TABLET | Freq: Four times a day (QID) | ORAL | Status: DC | PRN

## 2019-02-03 MED ORDER — ACETAMINOPHEN 650 MG RE SUPP: 650.0000 mg | Freq: Four times a day (QID) | RECTAL | Status: DC | PRN

## 2019-02-03 MED ORDER — FENTANYL CITRATE (PF) 100 MCG/2ML IJ SOLN
50.0000 ug | Freq: Once | INTRAMUSCULAR | Status: AC
  Administered 2019-02-03: 50 ug via INTRAVENOUS
  Filled 2019-02-03: qty 2

## 2019-02-03 MED ORDER — IOHEXOL 350 MG/ML SOLN
100.0000 mL | Freq: Once | INTRAVENOUS | Status: AC | PRN
  Administered 2019-02-03: 20:00:00 100 mL via INTRAVENOUS

## 2019-02-03 MED ORDER — FLEET ENEMA 7-19 GM/118ML RE ENEM: 1.0000 | ENEMA | Freq: Once | RECTAL | Status: DC | PRN

## 2019-02-03 NOTE — ED Triage Notes (Signed)
Pt BIB [facility] from home, family reports increased weakness and falls in the last 3 days. Pt normally ambulatory and occasionally uses a walker at home, has been relying on wheelchair more. Pt A&O to baseline, c/o back pain. EMS CBG 270, no known hx of diabetes per EMS. Given 2L NS PTA. Family reports pt is being tested for Parkinson's. EMS VS: BP 106/70, HR 68, SpO2 98%.

## 2019-02-03 NOTE — Consult Note (Signed)
Medical Consultation   Krista Hodges  [id number]  DOB: 1955/07/16  DOA: 02/03/2019  PCP: Josetta Huddle, MD  Requesting physician: Dr. Kathyrn Sheriff neurosurgeon.  Reason for consultation: Management of medical issues.   History of Present Illness: Krista Hodges is an 63 y.o. female history of hypertension, hyperlipidemia previous history of papillary thyroid cancer on Synthroid replacement, previous history of renal cell carcinoma status post left-sided nephrectomy, endometrial adenocarcinoma who has been noticed to have increasing change in mental status over the last 4 months.  Have been following up with neurologist.  At the time patient also was having some complaints of gait instability tremors for which patient also has been worked up for [employer] and possible cognitive decline.  Was placed on Aricept.  Over the last 3 days patient symptoms worsen with at least 6 falls for which patient was brought to the ER.  In the ER CT head showed right cerebellar mass with edema for which neurosurgery was consulted.  As a further work-up for the cerebellar mass CT chest abdomen pelvis was done which shows fractures involving the T12 and L3 and also mass involving the right middle lobe of the lung right breast and adrenal masses.  Labs show hypokalemia with potassium of 2.7 WBC count 13 hemoglobin 11 TSH of 75.6 free T4 0.45.  EKG shows normal sinus rhythm.  Covid test was negative.  On exam patient is alert awake oriented time place and person.  Moves all extremities.  Complains of low back pain.    Review of Systems:  Review of Systems  Constitutional: Negative.   HENT: Negative.   Eyes: Negative.   Respiratory: Negative.   Cardiovascular: Negative.   Gastrointestinal: Negative.   Genitourinary: Negative.   Musculoskeletal: Positive for back pain.  Skin: Negative.   Neurological: Positive for dizziness, tremors and weakness.  Psychiatric/Behavioral: Negative.    As  per HPI otherwise 10 point review of systems negative.  Unacceptable ROS statements: "10 systems reviewed," "Extensive" (without elaboration).  Acceptable ROS statements: "All others negative," "All others reviewed and are negative," and "All others unremarkable," with at [place] documented Can't double dip - if using for HPI can't use for ROS   Past Medical History: Past Medical History:  Diagnosis Date  . Allergic rhinitis   . Anxiety state   . Arthritis, degenerative   . Atypical migraine   . Awareness of heartbeats   . Cancer of left kidney (HCC)    Left Kidney  . Cervical radiculitis   . CFIDS (chronic fatigue and immune dysfunction syndrome) ([place])   . Depression, major, single episode, in partial remission ([place])   . Depression, neurotic   . Elevated serum creatinine 03/2016   history of (2.07)  . Endometrial adenocarcinoma ([place])    Stage II grade2  . Essential (primary) hypertension   . Gastric ulcer   . Heart murmur    pt. denies  . HLD (hyperlipidemia)   . Inflammation of hand joint   . Memory difficulties   . Premature complex, ventricular   . Renal disorder   . Renal mass, left   . Restless leg   . Shortness of breath dyspnea    occasional  . 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 Tremor    L arm  . Vitamin D deficiency     Past Surgical History: Past Surgical History:  Procedure Laterality Date  .  c sections x 3     . CARPAL TUNNEL RELEASE     rt hand  . LAPAROSCOPIC NEPHRECTOMY Left 05/21/2014   Procedure: LEFT RADICAL LAPAROSCOPIC NEPHRECTOMY;  Surgeon: Ardis Hughs, MD;  Location: [facility];  Service: Urology;  Laterality: Left;  Marland Kitchen MANDIBLE SURGERY    . NEPHRECTOMY TRANSPLANTED ORGAN     Left kidney removed  . ROBOTIC ASSISTED TOTAL HYSTERECTOMY WITH BILATERAL SALPINGO OOPHERECTOMY  12/27/2015  . THYROIDECTOMY N/A 05/09/2017   Procedure: TOTAL THYROIDECTOMY WITH LIMITED CENTRAL COMPARTMENT LYMPH NODE DISSECTION;   Surgeon: Armandina Gemma, MD;  Location: [facility];  Service: General;  Laterality: N/A;  . TOTAL THYROIDECTOMY     05-09-17 Dr. Harlow Asa     Allergies:   Allergies  Allergen Reactions  . Penicillins Hives    [place]. Has tolerated cephazolin and cephalexin. Has patient had a PCN reaction causing immediate rash, facial/tongue/throat swelling, SOB or lightheadedness with hypotension: Yes (per pt report) Has patient had a PCN reaction causing severe rash involving mucus membranes or skin necrosis: Yes Has patient had a PCN reaction that required hospitalization: No Has patient had a PCN reaction occurring within the last 10 years: No If all of the above answers are "NO", then may proceed with Cephalosporin  . Aspirin     Cannot take due to nephrectomy  . Keppra [Levetiracetam] Nausea Only    Anxiety   . Nsaids     Cannot take due to nephrectomy  . Requip [Ropinirole Hcl]     Headaches, restlessness     Social History:  reports that she quit smoking about 11 years ago. Her smoking use included cigarettes. She smoked 1.00 pack per day. She has never used smokeless tobacco. She reports current alcohol use. She reports that she does not use drugs.   Family History: Family History  Problem Relation Age of Onset  . Cancer Mother   . Heart attack Father   . Diabetes Other   . Dementia Neg Hx     Unacceptable: Noncontributory, unremarkable, or negative. Acceptable: Family history reviewed and not pertinent (If you reviewed it)   Physical Exam: Vitals:   02/03/19 2115 02/03/19 2230 02/03/19 2300 02/03/19 2315  BP: 122/80 122/64 130/76 126/70  Pulse: (!) 59 (!) 59 (!) 57   Resp: 18 14 13 13   SpO2: 98% 99% 99%     Constitutional: Alert awake oriented time place and person. Eyes: PERLA, EOMI, irises appear normal, anicteric sclera,  ENMT: external ears and nose appear normal, normal hearing.            Lips appears normal, oropharynx mucosa, tongue, posterior pharynx appear normal    Neck: neck appears normal, no masses, normal ROM, no thyromegaly, no JVD  CVS: S1-S2 clear, no murmur rubs or gallops, no LE edema, normal pedal pulses  Respiratory:  clear to auscultation bilaterally, no wheezing, rales or rhonchi. Respiratory effort normal. No accessory muscle use.  Abdomen: soft nontender, nondistended, normal bowel sounds, no hepatosplenomegaly, no hernias  Musculoskeletal: : no cyanosis, clubbing or edema noted bilaterally Neuro: Cranial nerves II-XII intact, strength, sensation, reflexes Psych: judgement and insight appear normal, stable mood and affect, mental status Skin: no rashes or lesions or ulcers, no induration or nodules   Data reviewed:  I have personally reviewed following labs and imaging studies Labs:  CBC: Recent Labs  Lab 02/03/19 1827  WBC 13.0*  NEUTROABS 11.9*  HGB 11.0*  HCT 34.4*  MCV 96.1  PLT 332  Basic Metabolic Panel: Recent Labs  Lab 02/03/19 1827  NA 135  K 2.7*  CL 98  CO2 26  GLUCOSE 154*  BUN 18  CREATININE 1.04*  CALCIUM 9.5   GFR CrCl cannot be calculated (Unknown ideal weight.). Liver Function Tests: Recent Labs  Lab 02/03/19 1827  AST 16  ALT 12  ALKPHOS 103  BILITOT 0.5  PROT 6.3*  ALBUMIN 3.2*   No results for input(s): LIPASE, AMYLASE in the last 168 hours. No results for input(s): AMMONIA in the last 168 hours. Coagulation profile Recent Labs  Lab 02/03/19 2151  INR 1.0    Cardiac Enzymes: Recent Labs  Lab 02/03/19 1827  CKTOTAL 46   BNP: Invalid input(s): POCBNP CBG: Recent Labs  Lab 02/03/19 1803  GLUCAP 165*   D-Dimer No results for input(s): DDIMER in the last 72 hours. Hgb A1c No results for input(s): HGBA1C in the last 72 hours. Lipid Profile No results for input(s): CHOL, HDL, LDLCALC, TRIG, CHOLHDL, LDLDIRECT in the last 72 hours. Thyroid function studies Recent Labs    02/03/19 1854  TSH 75.666*   Anemia work up No results for input(s): VITAMINB12, FOLATE,  FERRITIN, TIBC, IRON, RETICCTPCT in the last 72 hours. Urinalysis    Component Value Date/Time   COLORURINE YELLOW 02/03/2019 2115   APPEARANCEUR HAZY (A) 02/03/2019 2115   LABSPEC >1.046 (H) 02/03/2019 2115   PHURINE 7.0 02/03/2019 2115   GLUCOSEU NEGATIVE 02/03/2019 2115   HGBUR NEGATIVE 02/03/2019 2115   BILIRUBINUR NEGATIVE 02/03/2019 2115   KETONESUR NEGATIVE 02/03/2019 2115   PROTEINUR NEGATIVE 02/03/2019 2115   UROBILINOGEN 0.2 04/03/2014 0233   NITRITE NEGATIVE 02/03/2019 2115   LEUKOCYTESUR TRACE (A) 02/03/2019 2115     Microbiology Recent Results (from the past 240 hour(s))  Respiratory Panel by RT PCR (Flu A&B, Covid) - Nasopharyngeal Swab     Status: None   Collection Time: 02/03/19  9:12 PM   Specimen: Nasopharyngeal Swab  Result Value Ref Range Status   SARS Coronavirus 2 by RT PCR NEGATIVE NEGATIVE Final    Comment: (NOTE) SARS-CoV-2 target nucleic acids are NOT DETECTED. The SARS-CoV-2 RNA is generally detectable in upper respiratoy specimens during the acute phase of infection. The lowest concentration of SARS-CoV-2 viral copies this assay can detect is 131 copies/mL. A negative result does not preclude SARS-Cov-2 infection and should not be used as the sole basis for treatment or other patient management decisions. A negative result may occur with  improper specimen collection/handling, submission of specimen other than nasopharyngeal swab, presence of viral mutation(s) within the areas targeted by this assay, and inadequate number of viral copies (<131 copies/mL). A negative result must be combined with clinical observations, patient history, and epidemiological information. The expected result is Negative. Fact Sheet for Patients:  PinkCheek.be Fact Sheet for Healthcare Providers:  GravelBags.it This test is not yet ap proved or cleared by the [place] FDA and  has been authorized for  detection and/or diagnosis of SARS-CoV-2 by FDA under an Emergency Use Authorization (EUA). This EUA will remain  in effect (meaning this test can be used) for the duration of the COVID-19 declaration under Section 564(b)(1) of the Act, 21 U.S.C. section 360bbb-3(b)(1), unless the authorization is terminated or revoked sooner.    Influenza A by PCR NEGATIVE NEGATIVE Final   Influenza B by PCR NEGATIVE NEGATIVE Final    Comment: (NOTE) The Xpert Xpress SARS-CoV-2/FLU/RSV assay is intended as an aid in  the diagnosis of influenza from Nasopharyngeal  swab specimens and  should not be used as a sole basis for treatment. Nasal washings and  aspirates are unacceptable for Xpert Xpress SARS-CoV-2/FLU/RSV  testing. Fact Sheet for Patients: PinkCheek.be Fact Sheet for Healthcare Providers: GravelBags.it This test is not yet approved or cleared by the [place] FDA and  has been authorized for detection and/or diagnosis of SARS-CoV-2 by  FDA under an Emergency Use Authorization (EUA). This EUA will remain  in effect (meaning this test can be used) for the duration of the  Covid-19 declaration under Section 564(b)(1) of the Act, 21  U.S.C. section 360bbb-3(b)(1), unless the authorization is  terminated or revoked. Performed at [facility], [place] [address] [place], [place] [address]        Inpatient Medications:   Scheduled Meds: . [START ON 02/04/2019] buPROPion  200 mg Oral Daily  . busPIRone  15 mg Oral BID  . [START ON 02/04/2019] dexamethasone (DECADRON) injection  4 mg Intravenous Q6H  . docusate sodium  100 mg Oral BID  . irbesartan  300 mg Oral Daily  . [START ON 02/04/2019] levothyroxine  88 mcg Oral Q0600  . pramipexole  0.5 mg Oral QHS  . rosuvastatin  10 mg Oral QHS  . [START ON 02/04/2019] sertraline  100 mg Oral Daily   Continuous Infusions: . sodium chloride 100 mL/hr at 02/03/19 2223  .  levETIRAcetam       Radiological Exams on Admission: DG Chest 1 View  Result Date: 02/03/2019 CLINICAL DATA:  Generalized weakness after falling EXAM: CHEST  1 VIEW COMPARISON:  05/02/2017 FINDINGS: Cardiomediastinal contours are normal accounting for portable technique. Lungs are clear. Postoperative changes are noted in the low neck. Visualized skeletal structures are unremarkable. IMPRESSION: No acute cardiopulmonary disease. Electronically Signed   By: Zetta Bills M.D.   On: 02/03/2019 19:40   DG Forearm Right  Result Date: 02/03/2019 CLINICAL DATA:  Right forearm pain, left hand pain, left shoulder pain and generalized weakness. EXAM: RIGHT FOREARM - 2 VIEW COMPARISON:  None FINDINGS: There is no evidence of fracture or other focal bone lesions. Soft tissues are unremarkable. IMPRESSION: Negative evaluation of the right forearm Electronically Signed   By: Zetta Bills M.D.   On: 02/03/2019 19:39   CT Head Wo Contrast  Result Date: 02/03/2019 CLINICAL DATA:  Weakness and falls over last 3 days, history of renal cell carcinoma EXAM: CT HEAD WITHOUT CONTRAST TECHNIQUE: Contiguous axial images were obtained from the base of the skull through the vertex without intravenous contrast. COMPARISON:  None. FINDINGS: Brain: There is a round hemorrhagic mass seen within the posterior right cerebellum measuring 2.8 cm. The mass a significant surrounding edema causes mass effect upon the posterior fourth ventricle and left cerebellum. There is also edema extending into the left cerebellum. There is dilatation the ventricles and sulci consistent with age-related atrophy. Low-attenuation changes in the deep white matter consistent with small vessel ischemia. Vascular: No hyperdense vessel or unexpected calcification. Skull: The skull is intact. No fracture or focal lesion identified. Sinuses/Orbits: The visualized paranasal sinuses and mastoid air cells are clear. The orbits and globes intact. Other:  None IMPRESSION: Hemorrhagic 2.8 cm mass within the right cerebellum with surrounding vasogenic edema causing mass effect upon the fourth ventricle and left cerebellum. There is dilatation the ventricles and sulci consistent with age-related atrophy. Low-attenuation changes in the deep white matter consistent with small vessel ischemia. These results were called by telephone at the time of interpretation on 02/03/2019 at  8:46 pm to provider Marda Stalker , who verbally acknowledged these results. Electronically Signed   By: Prudencio Pair M.D.   On: 02/03/2019 20:54   CT Chest W Contrast  Result Date: 02/03/2019 CLINICAL DATA:  Increased weakness and falls for the last 3 days EXAM: CT CHEST, ABDOMEN, AND PELVIS WITH CONTRAST TECHNIQUE: Multidetector CT imaging of the chest, abdomen and pelvis was performed following the standard protocol during bolus administration of intravenous contrast. CONTRAST:  184mL OMNIPAQUE IOHEXOL 350 MG/ML SOLN COMPARISON:  CT chest, abdomen and pelvis 12/02/2015 FINDINGS: CT CHEST FINDINGS Cardiovascular: The aorta is normal caliber. No dissection flap or other acute luminal abnormality of the aorta is seen. No periaortic stranding or hemorrhage. Atherosclerotic plaque within the normal caliber aorta. Normal 3 vessel branching of the arch. Central pulmonary arteries are normal caliber. No large central filling defects on this non tailored examination. Normal heart size. No pericardial effusion. Mediastinum/Nodes: No mediastinal fluid or hemorrhage. Physiologic amount of fluid present within the pericardial recesses. There is mediastinal and right hilar adenopathy. Findings include a centrally hypoattenuating 1.9 cm subcarinal nodule (4/27) and a 1.4 cm right hilar node with central hypoattenuation (4/27). There is a borderline enlarged right axillary node measuring 10 mm (4/32). Few nodular soft tissue densities in the upper-outer quadrant of the right breast with a more focal  cutaneous based lesion in the lower inner quadrant measuring up to 2 cm in size. Lungs/Pleura: There is a multilobulated mass lesion abutting the pleura with acute angular margins in the medial aspect of the right middle lobe. This measures up to 3 cm in size (6/87). Smaller adjacent 9 mm satellite nodule is noted (6/76 few additional satellite nodules are noted adjacent the right hilar vessels and bronchi. Subpleural 5 mm nodule noted in the lateral left upper lobe (6/64). No acute traumatic abnormality of the lung parenchyma. No consolidation, features of edema, pneumothorax, or effusion. Musculoskeletal: Dedicated thoracic spine reconstructions were generated, please see that report for further details of the spine. No visible displaced rib fractures. No suspicious osseous lesions in the chest CT ABDOMEN PELVIS FINDINGS Hepatobiliary: No direct hepatic injury or perihepatic hematoma. Subcentimeter hypoattenuating focus in the posterior right lobe liver (3/2) is too small to fully characterize on CT imaging. No focal liver abnormality is seen. No gallstones, gallbladder wall thickening, or biliary dilatation. Pancreas: Unremarkable. No pancreatic ductal dilatation or surrounding inflammatory changes. Spleen: Normal in size without focal abnormality. Adrenals/Urinary Tract: Irregular centrally hypoattenuating 3.3 cm lesion in the left adrenal gland. Additional hypertense 1.6 cm nodule in the lateral limb right adrenal gland. No convincing adrenal hemorrhage is seen however patient appears to be post left nephrectomy with some surgical material in the left nephrectomy bed. No right renal injury or perirenal hemorrhage. No suspicious renal lesion. Normal enhancement and excretion without extravasation of contrast from the collecting system on urinary phase delays. Mild bladder wall thickening. Faint perivesicular haze. No direct bladder injury. Stomach/Bowel: Distal esophagus, stomach and duodenal sweep are  unremarkable. No small bowel wall thickening or dilatation. No evidence of obstruction. A normal appendix is visualized. No colonic dilatation or wall thickening. No sites of mesenteric hematoma or contusion. Vascular/Lymphatic: No direct vascular injury in the abdomen or pelvis. Few prominent retroperitoneal nodes are noted including a 8 mm periaortic lymph node (4/72). Reproductive: Patient is post hysterectomy. No concerning adnexal lesions. Postsurgical changes noted in the low anterior abdomen Other: Mild body wall edema. No large body wall hematoma. No bowel containing hernia. No  traumatic abdominal wall injury or dehiscence. Musculoskeletal: Dedicated lumbar spine reconstructions were generated, please see that report for further details. No abnormalities of the bony pelvis. No intramuscular hematoma or other acute muscular abnormality is seen. Slight age related atrophy is noted. IMPRESSION: 1. Compression deformities at T12 and L3, better detailed dedicated thoracolumbar spine reconstructions. Please see that report for further details. 2. No other evidence of acute traumatic injury to the chest, abdomen or pelvis. 3. Lobulated mass lesion in the medial aspect of the right middle lobe with adjacent satellite nodules and mediastinal and right hilar adenopathy. 4. Soft tissue nodule in the lower inner quadrant of the right breast with additional nodularity in the upper-outer quadrant of the same breast. Should correlate with mammography and dedicated breast imaging. 5. Bilateral adrenal masses concerning for metastatic disease. 6. Subcentimeter hypoattenuating focus in the right lobe liver as well as some prominent though nonenlarged lymph nodes, while these are typically characterized as benign incidental findings, should be viewed with some suspicion given the findings above. 7. Mild bladder wall thickening with subtle perivesicular haze, which may represent cystitis. 8. Prior left nephrectomy. 9.  Aortic  Atherosclerosis (ICD10-I70.0). These results were called by telephone at the time of interpretation on 02/03/2019 at 9:12 pm to provider [facility] , who verbally acknowledged these results. Electronically Signed   By: Lovena Le M.D.   On: 02/03/2019 21:14   CT Cervical Spine Wo Contrast  Result Date: 02/03/2019 CLINICAL DATA:  Fall. EXAM: CT CERVICAL SPINE WITHOUT CONTRAST TECHNIQUE: Multidetector CT imaging of the cervical spine was performed without intravenous contrast. Multiplanar CT image reconstructions were also generated. COMPARISON:  MRI cervical spine 12/27/2011 FINDINGS: Alignment: Normal Skull base and vertebrae: Negative for fracture Soft tissues and spinal canal: Negative for soft tissue mass or swelling. Prior thyroidectomy. Disc levels: Disc degeneration and spurring multiple levels. Bilateral foraminal stenosis at C3-4, C4-5, C5-6, C6-7 due to spurring. Upper chest: Lung apices clear bilaterally. Other: There is mass-effect on the posterior fossa with effacement of the fourth ventricle and obstructive hydrocephalus. There is question of high-density subdural blood in the right posterior fossa. This area is not well evaluated due to streak artifact. Recommend stat head CT. IMPRESSION: Abnormal mass-effect in the posterior fossa with obstructive hydrocephalus. Question subdural hematoma or mass in the posterior fossa on the right. Recommend stat head CT. Cervical spondylosis without fracture These results were called by telephone at the time of interpretation on 02/03/2019 at 8:49 pm to provider [facility] , who verbally acknowledged these results. Electronically Signed   By: Franchot Gallo M.D.   On: 02/03/2019 20:50   CT ABDOMEN PELVIS W CONTRAST  Result Date: 02/03/2019 CLINICAL DATA:  Increased weakness and falls for the last 3 days EXAM: CT CHEST, ABDOMEN, AND PELVIS WITH CONTRAST TECHNIQUE: Multidetector CT imaging of the chest, abdomen and pelvis was performed  following the standard protocol during bolus administration of intravenous contrast. CONTRAST:  184mL OMNIPAQUE IOHEXOL 350 MG/ML SOLN COMPARISON:  CT chest, abdomen and pelvis 12/02/2015 FINDINGS: CT CHEST FINDINGS Cardiovascular: The aorta is normal caliber. No dissection flap or other acute luminal abnormality of the aorta is seen. No periaortic stranding or hemorrhage. Atherosclerotic plaque within the normal caliber aorta. Normal 3 vessel branching of the arch. Central pulmonary arteries are normal caliber. No large central filling defects on this non tailored examination. Normal heart size. No pericardial effusion. Mediastinum/Nodes: No mediastinal fluid or hemorrhage. Physiologic amount of fluid present within the pericardial recesses. There is  mediastinal and right hilar adenopathy. Findings include a centrally hypoattenuating 1.9 cm subcarinal nodule (4/27) and a 1.4 cm right hilar node with central hypoattenuation (4/27). There is a borderline enlarged right axillary node measuring 10 mm (4/32). Few nodular soft tissue densities in the upper-outer quadrant of the right breast with a more focal cutaneous based lesion in the lower inner quadrant measuring up to 2 cm in size. Lungs/Pleura: There is a multilobulated mass lesion abutting the pleura with acute angular margins in the medial aspect of the right middle lobe. This measures up to 3 cm in size (6/87). Smaller adjacent 9 mm satellite nodule is noted (6/76 few additional satellite nodules are noted adjacent the right hilar vessels and bronchi. Subpleural 5 mm nodule noted in the lateral left upper lobe (6/64). No acute traumatic abnormality of the lung parenchyma. No consolidation, features of edema, pneumothorax, or effusion. Musculoskeletal: Dedicated thoracic spine reconstructions were generated, please see that report for further details of the spine. No visible displaced rib fractures. No suspicious osseous lesions in the chest CT ABDOMEN PELVIS  FINDINGS Hepatobiliary: No direct hepatic injury or perihepatic hematoma. Subcentimeter hypoattenuating focus in the posterior right lobe liver (3/2) is too small to fully characterize on CT imaging. No focal liver abnormality is seen. No gallstones, gallbladder wall thickening, or biliary dilatation. Pancreas: Unremarkable. No pancreatic ductal dilatation or surrounding inflammatory changes. Spleen: Normal in size without focal abnormality. Adrenals/Urinary Tract: Irregular centrally hypoattenuating 3.3 cm lesion in the left adrenal gland. Additional hypertense 1.6 cm nodule in the lateral limb right adrenal gland. No convincing adrenal hemorrhage is seen however patient appears to be post left nephrectomy with some surgical material in the left nephrectomy bed. No right renal injury or perirenal hemorrhage. No suspicious renal lesion. Normal enhancement and excretion without extravasation of contrast from the collecting system on urinary phase delays. Mild bladder wall thickening. Faint perivesicular haze. No direct bladder injury. Stomach/Bowel: Distal esophagus, stomach and duodenal sweep are unremarkable. No small bowel wall thickening or dilatation. No evidence of obstruction. A normal appendix is visualized. No colonic dilatation or wall thickening. No sites of mesenteric hematoma or contusion. Vascular/Lymphatic: No direct vascular injury in the abdomen or pelvis. Few prominent retroperitoneal nodes are noted including a 8 mm periaortic lymph node (4/72). Reproductive: Patient is post hysterectomy. No concerning adnexal lesions. Postsurgical changes noted in the low anterior abdomen Other: Mild body wall edema. No large body wall hematoma. No bowel containing hernia. No traumatic abdominal wall injury or dehiscence. Musculoskeletal: Dedicated lumbar spine reconstructions were generated, please see that report for further details. No abnormalities of the bony pelvis. No intramuscular hematoma or other acute  muscular abnormality is seen. Slight age related atrophy is noted. IMPRESSION: 1. Compression deformities at T12 and L3, better detailed dedicated thoracolumbar spine reconstructions. Please see that report for further details. 2. No other evidence of acute traumatic injury to the chest, abdomen or pelvis. 3. Lobulated mass lesion in the medial aspect of the right middle lobe with adjacent satellite nodules and mediastinal and right hilar adenopathy. 4. Soft tissue nodule in the lower inner quadrant of the right breast with additional nodularity in the upper-outer quadrant of the same breast. Should correlate with mammography and dedicated breast imaging. 5. Bilateral adrenal masses concerning for metastatic disease. 6. Subcentimeter hypoattenuating focus in the right lobe liver as well as some prominent though nonenlarged lymph nodes, while these are typically characterized as benign incidental findings, should be viewed with some suspicion given the  findings above. 7. Mild bladder wall thickening with subtle perivesicular haze, which may represent cystitis. 8. Prior left nephrectomy. 9.  Aortic Atherosclerosis (ICD10-I70.0). These results were called by telephone at the time of interpretation on 02/03/2019 at 9:12 pm to provider [facility] , who verbally acknowledged these results. Electronically Signed   By: Lovena Le M.D.   On: 02/03/2019 21:14   CT T-SPINE NO CHARGE  Result Date: 02/03/2019 CLINICAL DATA:  Increasing weakness and multiple falls for 3 days EXAM: CT THORACIC AND LUMBAR SPINE WITHOUT CONTRAST TECHNIQUE: Multiplanar CT images of the thoracic and lumbar spine were reconstructed from contemporary CT of the Chest, abdomen and pelvis. CONTRAST:  None or No additional COMPARISON:  Chest radiograph 02/03/2019 CT abdomen pelvis 05/04/2015, chest radiograph 05/02/2017, lumbar radiographs 10/13/2018 FINDINGS: THORACIC SPINE: Alignment: There is straightening of the normal thoracic kyphosis  throughout the lower thoracic levels with focal kyphotic curvature at the level of the T12 compression deformity. Mild dextrocurvature throughout the thoracic spine with an apex at the T11 level. No traumatic listhesis. No abnormal widened, jumped or perched facets. Vertebrae: There is multilevel Schmorl's node formations. Anterior wedging compression deformity of T12 (AO spine A1) with approximately 50% height loss anteriorly some persistent lucency suggesting acuity. No other acute osseous injury or compression deformity is seen in the thoracic spine. No suspicious osseous lesions. Paraspinal and other soft tissues: Small amount paravertebral soft tissue thickening/stranding adjacent the T12 compression deformity. Finding favors acuity. For findings within the posterior chest and mediastinum, see dedicated CT from which this study is reconstructed. Disc levels: There is diffuse multilevel intervertebral disc height loss with discogenic endplate changes and some posterior disc osteophyte formation at T4-T5, T5-T6 and T7-T8 but without significant resulting canal stenosis or neural foraminal narrowing in the imaged thoracic spine. LUMBAR SPINE: Segmentation: 5 non-rib-bearing lumbar type vertebral levels. Alignment: There is slight levocurvature of the lumbar spine with an apex at L3. No abnormally widened, jumped or perched facets. Vertebrae: There is a split/pincer type compression deformity of L3 (AO spine A2). Central height loss of approximately 50%. No abnormal posterior wall involvement or retropulsion of fracture fragments. No additional acute fracture is seen. Posterior elements are intact. No suspicious osseous lesions. Multilevel Schmorl's node formations seen in the lumbar spine. Paraspinal and other soft tissues: Small amount of paravertebral soft tissue thickening at the L3 level suggests acuity. No visible canal hematoma. For findings in the posterior abdomen and pelvis, see dedicated CT from which  this study is reconstructed. Disc levels: Multilevel intervertebral disc height loss throughout the lumbar spine with multilevel global disc bulges. Central disc protrusions at L2 and L3 result in mild canal stenosis. A left central disc protrusion at L1 results in more moderate canal narrowing. Facet hypertrophic changes are maximal at L5-S1 with resulting mild to moderate multilevel neural foraminal narrowing the lumbar spine. IMPRESSION: 1. Anterior wedging compression deformity T12 with 50% height loss anteriorly (AOSpineA1). 2. Split/Pincer type compression deformity of L3 with approximately 50% height loss anteriorly (AOSpine A2). 3. Faint paraspinal soft tissue thickening adjacent both these deformities suggest acuity. 4. No other acute osseous injuries in the thoracic or lumbar spine. 5. Multilevel degenerative changes of the thoracic and lumbar spine, as described above. 6. For findings in the posterior chest, abdomen and pelvis, see dedicated CT from which this study is reconstructed. Electronically Signed   By: Lovena Le M.D.   On: 02/03/2019 20:56   CT L-SPINE NO CHARGE  Result Date: 02/03/2019 CLINICAL  DATA:  Increasing weakness and multiple falls for 3 days EXAM: CT THORACIC AND LUMBAR SPINE WITHOUT CONTRAST TECHNIQUE: Multiplanar CT images of the thoracic and lumbar spine were reconstructed from contemporary CT of the Chest, abdomen and pelvis. CONTRAST:  None or No additional COMPARISON:  Chest radiograph 02/03/2019 CT abdomen pelvis 05/04/2015, chest radiograph 05/02/2017, lumbar radiographs 10/13/2018 FINDINGS: THORACIC SPINE: Alignment: There is straightening of the normal thoracic kyphosis throughout the lower thoracic levels with focal kyphotic curvature at the level of the T12 compression deformity. Mild dextrocurvature throughout the thoracic spine with an apex at the T11 level. No traumatic listhesis. No abnormal widened, jumped or perched facets. Vertebrae: There is multilevel  Schmorl's node formations. Anterior wedging compression deformity of T12 (AO spine A1) with approximately 50% height loss anteriorly some persistent lucency suggesting acuity. No other acute osseous injury or compression deformity is seen in the thoracic spine. No suspicious osseous lesions. Paraspinal and other soft tissues: Small amount paravertebral soft tissue thickening/stranding adjacent the T12 compression deformity. Finding favors acuity. For findings within the posterior chest and mediastinum, see dedicated CT from which this study is reconstructed. Disc levels: There is diffuse multilevel intervertebral disc height loss with discogenic endplate changes and some posterior disc osteophyte formation at T4-T5, T5-T6 and T7-T8 but without significant resulting canal stenosis or neural foraminal narrowing in the imaged thoracic spine. LUMBAR SPINE: Segmentation: 5 non-rib-bearing lumbar type vertebral levels. Alignment: There is slight levocurvature of the lumbar spine with an apex at L3. No abnormally widened, jumped or perched facets. Vertebrae: There is a split/pincer type compression deformity of L3 (AO spine A2). Central height loss of approximately 50%. No abnormal posterior wall involvement or retropulsion of fracture fragments. No additional acute fracture is seen. Posterior elements are intact. No suspicious osseous lesions. Multilevel Schmorl's node formations seen in the lumbar spine. Paraspinal and other soft tissues: Small amount of paravertebral soft tissue thickening at the L3 level suggests acuity. No visible canal hematoma. For findings in the posterior abdomen and pelvis, see dedicated CT from which this study is reconstructed. Disc levels: Multilevel intervertebral disc height loss throughout the lumbar spine with multilevel global disc bulges. Central disc protrusions at L2 and L3 result in mild canal stenosis. A left central disc protrusion at L1 results in more moderate canal narrowing.  Facet hypertrophic changes are maximal at L5-S1 with resulting mild to moderate multilevel neural foraminal narrowing the lumbar spine. IMPRESSION: 1. Anterior wedging compression deformity T12 with 50% height loss anteriorly (AOSpineA1). 2. Split/Pincer type compression deformity of L3 with approximately 50% height loss anteriorly (AOSpine A2). 3. Faint paraspinal soft tissue thickening adjacent both these deformities suggest acuity. 4. No other acute osseous injuries in the thoracic or lumbar spine. 5. Multilevel degenerative changes of the thoracic and lumbar spine, as described above. 6. For findings in the posterior chest, abdomen and pelvis, see dedicated CT from which this study is reconstructed. Electronically Signed   By: Lovena Le M.D.   On: 02/03/2019 20:56   DG Shoulder Left  Result Date: 02/03/2019 CLINICAL DATA:  Shoulder pain EXAM: LEFT SHOULDER - 2+ VIEW COMPARISON:  None FINDINGS: There is no evidence of fracture or dislocation. There is no evidence of arthropathy or other focal bone abnormality. Soft tissues are unremarkable. IMPRESSION: Negative evaluation of the left shoulder. Electronically Signed   By: Zetta Bills M.D.   On: 02/03/2019 19:43   DG Hand Complete Left  Result Date: 02/03/2019 CLINICAL DATA:  Hand pain EXAM: LEFT HAND -  COMPLETE 3+ VIEW COMPARISON:  None FINDINGS: Degenerative changes at the first carpometacarpal joint. No signs of fracture or dislocation. Soft tissues are unremarkable. IMPRESSION: Degenerative changes without fracture. Electronically Signed   By: Zetta Bills M.D.   On: 02/03/2019 19:42    Impression/Recommendations Active Problems:   Brain tumor [facility])     Thank you for this consultation.  Our [facility] hospitalist team will follow the patient with you.   Time Spent: #1.  Cerebellar mass with vasogenic edema for which neurosurgery has been consulted and patient is on Decadron at this time.  Plan is to have surgery done next morning. #2.   Hypertension we will continue ARB and keep patient on as needed IV hydralazine for now.  Hold hydrochlorothiazide due to low potassium. #3.  Hypokalemia likely due to poor oral intake and hydrochlorothiazide.  Replace recheck.  Check magnesium.  Hold hydrochlorothiazide. #4.  Hypothyroidism not sure if patient has been taking her medications TSH markedly elevated.  Synthroid dose has been increased from 50 mics to 88 mics.  Closely follow. #5.  Anemia follow CBC closely. #6.  Hyperlipidemia on statins. #7.  Mass seen in the right middle lobe of the lung breast and additional masses will need further work-up.  Awaiting results from the brain mass surgery. #8.  Depression on Zoloft Wellbutrin. #9.  Recently diagnosed cognitive decline on Aricept and Namenda. #10 per history of papillary thyroid carcinoma status post treatment history of renal cell carcinoma status post left-sided nephrectomy history of endometrial cancer. #11.  Mild hyperglycemia we will check hemoglobin A1c with next blood draw.  Thank you for involving [place] in patient's care will follow along with you.  Time -50 minutes.  Rise Patience M.D. [facility] 02/03/2019, 11:45 PM

## 2019-02-03 NOTE — ED Provider Notes (Signed)
[facility] EMERGENCY DEPARTMENT Provider Note   CSN: [id number] Arrival date & time: 02/03/19  1753     History Chief Complaint  Patient presents with  . Fall    VENESIA CHREST is a 63 y.o. female.  HPI RANE VAUGH is a 63 y.o. female with a medical history of HTN, depression, HLD and multiple cancers including RCC s/p nephrectomy around 2015, endometrial adenocarcinoma 2017, papillary thyroid carcinoma 2019 s/p radiation  who presents to the ED for increasing unsteadiness, falls and increasing confusion. Daughter provides most of history. She was tentatively diagnosed with new onset dementia about 4 months ago when she started having these symptoms. Due to increasing falls, weakness and confusion daughter brought her here tonight, especially since today she fell and hit the left side of her head against a trash can. Patient also complains of pain to her left lateral chest and her back. She complains of midline lower thoracic/lumbar back pain. No radicular symptoms. No weakness in less. Describes difficulties walking due to feeling off balance. Has not noticed confusion issues. Not on blood thinning agents.      Past Medical History:  Diagnosis Date  . Allergic rhinitis   . Anxiety state   . Arthritis, degenerative   . Atypical migraine   . Awareness of heartbeats   . Cancer of left kidney (HCC)    Left Kidney  . Cervical radiculitis   . CFIDS (chronic fatigue and immune dysfunction syndrome) ([place])   . Depression, major, single episode, in partial remission ([place])   . Depression, neurotic   . Elevated serum creatinine 03/2016   history of (2.07)  . Endometrial adenocarcinoma ([place])    Stage II grade2  . Essential (primary) hypertension   . Gastric ulcer   . Heart murmur    pt. denies  . HLD (hyperlipidemia)   . Inflammation of hand joint   . Memory difficulties   . Premature complex, ventricular   . Renal disorder   . Renal mass, left   . Restless  leg   . Shortness of breath dyspnea    occasional  . 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 Tremor    L arm  . Vitamin D deficiency     Patient Active Problem List   Diagnosis Date Noted  . Brain tumor ([place]) 02/03/2019  . Hypokalemia 02/03/2019  . Hypothyroidism 02/03/2019  . Papillary thyroid carcinoma ([place]) 05/05/2017  . Tremor 01/31/2017  . Left renal mass 05/21/2014  . Idiopathic acute pancreatitis   . Urinary tract infectious disease   . Pancreatic mass   . Other acute pancreatitis   . Left kidney mass   . Acute pancreatitis 04/03/2014  . PVC (premature ventricular contraction) 10/20/2013  . Dyspnea 10/20/2013  . Allergic rhinitis 10/16/2013  . Anxiety state 10/16/2013  . Enthesopathy of hip 10/16/2013  . Cervical radiculitis 10/16/2013  . 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 CFIDS (chronic fatigue and immune dysfunction syndrome) ([place]) 10/16/2013  . Inflammation of hand joint 10/16/2013  . Atypical migraine 10/16/2013  . Excessive sweating, local 10/16/2013  . HLD (hyperlipidemia) 10/16/2013  . Depression, major, single episode, in partial remission ([place]) 10/16/2013  . Amnesia 10/16/2013  . Adiposity 10/16/2013  . Arthritis, degenerative 10/16/2013  . Restless leg 10/16/2013  . Adaptation reaction 10/16/2013  . Thrombophlebitis of superficial veins of lower extremity 10/16/2013  . Essential (primary) hypertension 10/16/2013  . Avitaminosis D 10/16/2013  . Awareness of heartbeats 09/16/2013  .  Premature complex, ventricular 09/16/2013    Past Surgical History:  Procedure Laterality Date  . c sections x 3     . CARPAL TUNNEL RELEASE     rt hand  . LAPAROSCOPIC NEPHRECTOMY Left 05/21/2014   Procedure: LEFT RADICAL LAPAROSCOPIC NEPHRECTOMY;  Surgeon: Ardis Hughs, MD;  Location: [facility];  Service: Urology;  Laterality: Left;  Marland Kitchen MANDIBLE SURGERY    . NEPHRECTOMY TRANSPLANTED ORGAN     Left kidney removed  . ROBOTIC ASSISTED TOTAL  HYSTERECTOMY WITH BILATERAL SALPINGO OOPHERECTOMY  12/27/2015  . THYROIDECTOMY N/A 05/09/2017   Procedure: TOTAL THYROIDECTOMY WITH LIMITED CENTRAL COMPARTMENT LYMPH NODE DISSECTION;  Surgeon: Armandina Gemma, MD;  Location: [facility];  Service: General;  Laterality: N/A;  . TOTAL THYROIDECTOMY     05-09-17 Dr. Harlow Asa     OB History   No obstetric history on file.     Family History  Problem Relation Age of Onset  . Cancer Mother   . Heart attack Father   . Diabetes Other   . Dementia Neg Hx     Social History   Tobacco Use  . Smoking status: Former Smoker    Packs/[place] Krist: 1.00    Types: Cigarettes    Quit date: 05/03/2007    Years since quitting: 11.7  . Smokeless tobacco: Never Used  Substance Use Topics  . Alcohol use: Yes    Comment: occ  . Drug use: No    Home Medications Prior to Admission medications   Medication Sig Start Date End Date Taking? Authorizing Provider  acetaminophen (TYLENOL) 500 MG tablet Take 500-1,000 mg by mouth daily as needed for moderate pain or headache.   Yes [provider]  buPROPion (WELLBUTRIN) 100 MG tablet Take 100 mg by mouth daily.    Yes [provider]  busPIRone (BUSPAR) 15 MG tablet Take 15 mg by mouth 2 (two) times daily.   Yes [provider]  donepezil (ARICEPT) 10 MG tablet Take 10 mg by mouth at bedtime.  01/02/19  Yes [provider]  hydrochlorothiazide (HYDRODIURIL) 25 MG tablet Take 25 mg by mouth daily.   Yes [provider]  irbesartan (AVAPRO) 300 MG tablet Take 300 mg daily by mouth.    Yes [provider]  levothyroxine (SYNTHROID) 50 MCG tablet Take 50 mcg by mouth daily. 01/02/19  Yes [provider]  memantine (NAMENDA) 5 MG tablet Take 5 mg by mouth 2 (two) times daily. 01/02/19  Yes [provider]  pramipexole (MIRAPEX) 0.5 MG tablet Take 0.5 mg by mouth at bedtime.   Yes [provider]  Propylene Glycol (SYSTANE BALANCE) 0.6 % SOLN Place 1  drop into both eyes daily.   Yes [provider]  rosuvastatin (CRESTOR) 10 MG tablet Take 10 mg by mouth daily.    Yes [provider]  sertraline (ZOLOFT) 100 MG tablet Take 50 mg by mouth 2 (two) times daily. 01/02/19  Yes [provider]  tiZANidine (ZANAFLEX) 4 MG tablet Take 4 mg by mouth as needed for muscle spasms.  12/19/18  Yes [provider]  traMADol-acetaminophen (ULTRACET) 37.5-325 MG tablet Take 1 tablet by mouth as needed for moderate pain.  01/02/19  Yes [provider]  traZODone (DESYREL) 50 MG tablet Take 50-100 mg by mouth at bedtime as needed for sleep.    Yes [provider]  calcium carbonate (TUMS) 500 MG chewable tablet Chew 2 tablets (400 mg of elemental calcium total) by mouth 2 (two)  times daily. Patient not taking: Reported on 02/03/2019 05/10/17   Armandina Gemma, MD  docusate sodium (COLACE) 100 MG capsule Take 1 capsule (100 mg total) by mouth 2 (two) times daily as needed (take to keep stool soft.). Patient not taking: Reported on 02/03/2019 05/21/14   Ardis Hughs, MD  traMADol (ULTRAM) 50 MG tablet Take 1-2 tablets (50-100 mg total) by mouth every 6 (six) hours as needed for moderate pain (mild pain not responsive to acetaminophen). Patient not taking: Reported on 02/03/2019 05/10/17   Armandina Gemma, MD    Allergies    Penicillins, Aspirin, Keppra [levetiracetam], Nsaids, and Requip [ropinirole hcl]  Review of Systems   Review of Systems  Constitutional: Positive for fatigue. Negative for chills and fever.  HENT: Negative for ear pain and sore throat.   Eyes: Negative for pain and visual disturbance.  Respiratory: Negative for cough and shortness of breath.   Cardiovascular: Positive for chest pain (Left lateral rib pain). Negative for palpitations.  Gastrointestinal: Negative for abdominal pain and vomiting.  Genitourinary: Negative for dysuria and hematuria.  Musculoskeletal: Positive for back pain.  Negative for arthralgias and neck pain.  Skin: Negative for color change and rash.  Neurological: Positive for weakness. Negative for seizures and syncope.  Psychiatric/Behavioral: Positive for confusion.  All other systems reviewed and are negative.   Physical Exam Updated Vital Signs BP (!) 142/79   Pulse (!) 52   Temp 97.6 F (36.4 C) (Axillary)   Resp 16   Ht 5\' 6"  (1.676 m)   Wt 65 kg   SpO2 99%   BMI 23.13 kg/m   Physical Exam Vitals and nursing note reviewed.  Constitutional:      General: She is not in acute distress.    Appearance: Normal appearance. She is well-developed. She is obese. She is ill-appearing.  HENT:     Head: Normocephalic.     Comments: Left parietal scalp with contusion    Right Ear: External ear normal.     Left Ear: External ear normal.     Nose: Nose normal. No rhinorrhea.     Mouth/Throat:     Mouth: Mucous membranes are moist.  Eyes:     General:        Right eye: No discharge.        Left eye: No discharge.     Conjunctiva/sclera: Conjunctivae normal.  Neck:     Comments: C collar in place Cardiovascular:     Rate and Rhythm: Normal rate and regular rhythm.     Pulses: Normal pulses.     Heart sounds: Normal heart sounds. No murmur.  Pulmonary:     Effort: Pulmonary effort is normal. No respiratory distress.     Breath sounds: Normal breath sounds. No wheezing or rales.  Chest:     Chest wall: Tenderness (Left lateral chest wall tenderness) present.  Abdominal:     General: Abdomen is flat. There is no distension.     Palpations: Abdomen is soft.     Tenderness: There is no abdominal tenderness.  Musculoskeletal:        General: No deformity or signs of injury. Normal range of motion.     Cervical back: Neck supple. No tenderness.  Skin:    General: Skin is warm and dry.     Capillary Refill: Capillary refill takes less than 2 seconds.     Coloration: Skin is not jaundiced.  Neurological:     General: No focal deficit  present.  Mental Status: She is alert. She is disoriented.     Cranial Nerves: No cranial nerve deficit.     Sensory: No sensory deficit.     Motor: Weakness present.     Coordination: Coordination abnormal.     Gait: Gait abnormal.     Comments: Awake, mildly drowsy, oriented Gait testing deferred Slow movements on finger to nose Plantar response No pronator drift Sensation intact to LT Strength 4/5 in the BUE and BLE  CRANIAL NERVES: 2 (Optic)-PERRLA. VF intact to confrontation. 3/4/6 (Oculomotor, Trochlear, Abudcens)- EOMI 5 (Trigeminal)-Sensation intact topinprick 7 (Facial)-Symmetric facial expression 8 (Vestibulococchlear)-Responds to voice 9/10 (Glossopharyngeal)-Symmetric palate and uvula elevation 11 (Spinal accessory)-Head midline 12 (Hypoglossal)-Tongue Midline   Psychiatric:        Mood and Affect: Mood normal.        Behavior: Behavior normal.     ED Results / Procedures / Treatments   Labs (all labs ordered are listed, but only abnormal results are displayed) Labs Reviewed  COMPREHENSIVE METABOLIC PANEL - Abnormal; Notable for the following components:      Result Value   Potassium 2.7 (*)    Glucose, Bld 154 (*)    Creatinine, Ser 1.04 (*)    Total Protein 6.3 (*)    Albumin 3.2 (*)    GFR calc non Af Amer 56 (*)    All other components within normal limits  CBC WITH DIFFERENTIAL/PLATELET - Abnormal; Notable for the following components:   WBC 13.0 (*)    RBC 3.58 (*)    Hemoglobin 11.0 (*)    HCT 34.4 (*)    Neutro Abs 11.9 (*)    Lymphs Abs 0.3 (*)    Abs Immature Granulocytes 0.12 (*)    All other components within normal limits  URINALYSIS, ROUTINE W REFLEX MICROSCOPIC - Abnormal; Notable for the following components:   APPearance HAZY (*)    Specific Gravity, Urine >1.046 (*)    Leukocytes,Ua TRACE (*)    All other components within normal limits  TSH - Abnormal; Notable for the following components:   TSH 75.666 (*)      All other components within normal limits  T4, FREE - Abnormal; Notable for the following components:   Free T4 0.45 (*)    All other components within normal limits  BASIC METABOLIC PANEL - Abnormal; Notable for the following components:   Potassium 3.4 (*)    Glucose, Bld 130 (*)    All other components within normal limits  CBC - Abnormal; Notable for the following components:   RBC 3.00 (*)    Hemoglobin 9.2 (*)    HCT 28.6 (*)    All other components within normal limits  CBG MONITORING, ED - Abnormal; Notable for the following components:   Glucose-Capillary 165 (*)    All other components within normal limits  POCT I-STAT 7, (LYTES, BLD GAS, ICA,H+H) - Abnormal; Notable for the following components:   pO2, Arterial 289.0 (*)    Potassium 3.0 (*)    HCT 23.0 (*)    Hemoglobin 7.8 (*)    All other components within normal limits  POCT I-STAT 7, (LYTES, BLD GAS, ICA,H+H) - Abnormal; Notable for the following components:   pO2, Arterial 276.0 (*)    Potassium 3.3 (*)    HCT 25.0 (*)    Hemoglobin 8.5 (*)    All other components within normal limits  RESPIRATORY PANEL BY RT PCR (FLU A&B, COVID)  URINE CULTURE  MRSA PCR SCREENING  CK  HIV ANTIBODY (ROUTINE TESTING W REFLEX)  APTT  PROTIME-INR  MAGNESIUM  HEMOGLOBIN [id number]  BASIC METABOLIC PANEL  CBC  TYPE AND SCREEN  ABO/RH  PREPARE RBC (CROSSMATCH)  SURGICAL PATHOLOGY    EKG EKG Interpretation  Date/Time:  Tuesday February 03 2019 18:04:47 EST Ventricular Rate:  61 PR Interval:    QRS Duration: 98 QT Interval:  451 QTC Calculation: 455 R Axis:   75 Text Interpretation: Sinus rhythm Low voltage, precordial leads When compared to prior, no significant changes seen. No STEMI Confirmed by Antony Blackbird [phone]) on 02/03/2019 6:05:51 PM Also confirmed by Antony Blackbird [phone]), editor [address] [place] [phone])  on 02/04/2019 10:04:18 AM   Radiology DG Chest 1 View  Result Date: 02/03/2019 CLINICAL DATA:   Generalized weakness after falling EXAM: CHEST  1 VIEW COMPARISON:  05/02/2017 FINDINGS: Cardiomediastinal contours are normal accounting for portable technique. Lungs are clear. Postoperative changes are noted in the low neck. Visualized skeletal structures are unremarkable. IMPRESSION: No acute cardiopulmonary disease. Electronically Signed   By: Zetta Bills M.D.   On: 02/03/2019 19:40   DG Forearm Right  Result Date: 02/03/2019 CLINICAL DATA:  Right forearm pain, left hand pain, left shoulder pain and generalized weakness. EXAM: RIGHT FOREARM - 2 VIEW COMPARISON:  None FINDINGS: There is no evidence of fracture or other focal bone lesions. Soft tissues are unremarkable. IMPRESSION: Negative evaluation of the right forearm Electronically Signed   By: Zetta Bills M.D.   On: 02/03/2019 19:39   CT Head Wo Contrast  Result Date: 02/03/2019 CLINICAL DATA:  Weakness and falls over last 3 days, history of renal cell carcinoma EXAM: CT HEAD WITHOUT CONTRAST TECHNIQUE: Contiguous axial images were obtained from the base of the skull through the vertex without intravenous contrast. COMPARISON:  None. FINDINGS: Brain: There is a round hemorrhagic mass seen within the posterior right cerebellum measuring 2.8 cm. The mass a significant surrounding edema causes mass effect upon the posterior fourth ventricle and left cerebellum. There is also edema extending into the left cerebellum. There is dilatation the ventricles and sulci consistent with age-related atrophy. Low-attenuation changes in the deep white matter consistent with small vessel ischemia. Vascular: No hyperdense vessel or unexpected calcification. Skull: The skull is intact. No fracture or focal lesion identified. Sinuses/Orbits: The visualized paranasal sinuses and mastoid air cells are clear. The orbits and globes intact. Other: None IMPRESSION: Hemorrhagic 2.8 cm mass within the right cerebellum with surrounding vasogenic edema causing mass effect  upon the fourth ventricle and left cerebellum. There is dilatation the ventricles and sulci consistent with age-related atrophy. Low-attenuation changes in the deep white matter consistent with small vessel ischemia. These results were called by telephone at the time of interpretation on 02/03/2019 at 8:46 pm to provider [facility] , who verbally acknowledged these results. Electronically Signed   By: Prudencio Pair M.D.   On: 02/03/2019 20:54   CT Chest W Contrast  Result Date: 02/03/2019 CLINICAL DATA:  Increased weakness and falls for the last 3 days EXAM: CT CHEST, ABDOMEN, AND PELVIS WITH CONTRAST TECHNIQUE: Multidetector CT imaging of the chest, abdomen and pelvis was performed following the standard protocol during bolus administration of intravenous contrast. CONTRAST:  168mL OMNIPAQUE IOHEXOL 350 MG/ML SOLN COMPARISON:  CT chest, abdomen and pelvis 12/02/2015 FINDINGS: CT CHEST FINDINGS Cardiovascular: The aorta is normal caliber. No dissection flap or other acute luminal abnormality of the aorta is seen. No periaortic stranding or hemorrhage. Atherosclerotic plaque within the normal caliber  aorta. Normal 3 vessel branching of the arch. Central pulmonary arteries are normal caliber. No large central filling defects on this non tailored examination. Normal heart size. No pericardial effusion. Mediastinum/Nodes: No mediastinal fluid or hemorrhage. Physiologic amount of fluid present within the pericardial recesses. There is mediastinal and right hilar adenopathy. Findings include a centrally hypoattenuating 1.9 cm subcarinal nodule (4/27) and a 1.4 cm right hilar node with central hypoattenuation (4/27). There is a borderline enlarged right axillary node measuring 10 mm (4/32). Few nodular soft tissue densities in the upper-outer quadrant of the right breast with a more focal cutaneous based lesion in the lower inner quadrant measuring up to 2 cm in size. Lungs/Pleura: There is a multilobulated  mass lesion abutting the pleura with acute angular margins in the medial aspect of the right middle lobe. This measures up to 3 cm in size (6/87). Smaller adjacent 9 mm satellite nodule is noted (6/76 few additional satellite nodules are noted adjacent the right hilar vessels and bronchi. Subpleural 5 mm nodule noted in the lateral left upper lobe (6/64). No acute traumatic abnormality of the lung parenchyma. No consolidation, features of edema, pneumothorax, or effusion. Musculoskeletal: Dedicated thoracic spine reconstructions were generated, please see that report for further details of the spine. No visible displaced rib fractures. No suspicious osseous lesions in the chest CT ABDOMEN PELVIS FINDINGS Hepatobiliary: No direct hepatic injury or perihepatic hematoma. Subcentimeter hypoattenuating focus in the posterior right lobe liver (3/2) is too small to fully characterize on CT imaging. No focal liver abnormality is seen. No gallstones, gallbladder wall thickening, or biliary dilatation. Pancreas: Unremarkable. No pancreatic ductal dilatation or surrounding inflammatory changes. Spleen: Normal in size without focal abnormality. Adrenals/Urinary Tract: Irregular centrally hypoattenuating 3.3 cm lesion in the left adrenal gland. Additional hypertense 1.6 cm nodule in the lateral limb right adrenal gland. No convincing adrenal hemorrhage is seen however patient appears to be post left nephrectomy with some surgical material in the left nephrectomy bed. No right renal injury or perirenal hemorrhage. No suspicious renal lesion. Normal enhancement and excretion without extravasation of contrast from the collecting system on urinary phase delays. Mild bladder wall thickening. Faint perivesicular haze. No direct bladder injury. Stomach/Bowel: Distal esophagus, stomach and duodenal sweep are unremarkable. No small bowel wall thickening or dilatation. No evidence of obstruction. A normal appendix is visualized. No  colonic dilatation or wall thickening. No sites of mesenteric hematoma or contusion. Vascular/Lymphatic: No direct vascular injury in the abdomen or pelvis. Few prominent retroperitoneal nodes are noted including a 8 mm periaortic lymph node (4/72). Reproductive: Patient is post hysterectomy. No concerning adnexal lesions. Postsurgical changes noted in the low anterior abdomen Other: Mild body wall edema. No large body wall hematoma. No bowel containing hernia. No traumatic abdominal wall injury or dehiscence. Musculoskeletal: Dedicated lumbar spine reconstructions were generated, please see that report for further details. No abnormalities of the bony pelvis. No intramuscular hematoma or other acute muscular abnormality is seen. Slight age related atrophy is noted. IMPRESSION: 1. Compression deformities at T12 and L3, better detailed dedicated thoracolumbar spine reconstructions. Please see that report for further details. 2. No other evidence of acute traumatic injury to the chest, abdomen or pelvis. 3. Lobulated mass lesion in the medial aspect of the right middle lobe with adjacent satellite nodules and mediastinal and right hilar adenopathy. 4. Soft tissue nodule in the lower inner quadrant of the right breast with additional nodularity in the upper-outer quadrant of the same breast. Should correlate with mammography  and dedicated breast imaging. 5. Bilateral adrenal masses concerning for metastatic disease. 6. Subcentimeter hypoattenuating focus in the right lobe liver as well as some prominent though nonenlarged lymph nodes, while these are typically characterized as benign incidental findings, should be viewed with some suspicion given the findings above. 7. Mild bladder wall thickening with subtle perivesicular haze, which may represent cystitis. 8. Prior left nephrectomy. 9.  Aortic Atherosclerosis (ICD10-I70.0). These results were called by telephone at the time of interpretation on 02/03/2019 at 9:12 pm  to provider [facility] , who verbally acknowledged these results. Electronically Signed   By: Lovena Le M.D.   On: 02/03/2019 21:14   CT Cervical Spine Wo Contrast  Result Date: 02/03/2019 CLINICAL DATA:  Fall. EXAM: CT CERVICAL SPINE WITHOUT CONTRAST TECHNIQUE: Multidetector CT imaging of the cervical spine was performed without intravenous contrast. Multiplanar CT image reconstructions were also generated. COMPARISON:  MRI cervical spine 12/27/2011 FINDINGS: Alignment: Normal Skull base and vertebrae: Negative for fracture Soft tissues and spinal canal: Negative for soft tissue mass or swelling. Prior thyroidectomy. Disc levels: Disc degeneration and spurring multiple levels. Bilateral foraminal stenosis at C3-4, C4-5, C5-6, C6-7 due to spurring. Upper chest: Lung apices clear bilaterally. Other: There is mass-effect on the posterior fossa with effacement of the fourth ventricle and obstructive hydrocephalus. There is question of high-density subdural blood in the right posterior fossa. This area is not well evaluated due to streak artifact. Recommend stat head CT. IMPRESSION: Abnormal mass-effect in the posterior fossa with obstructive hydrocephalus. Question subdural hematoma or mass in the posterior fossa on the right. Recommend stat head CT. Cervical spondylosis without fracture These results were called by telephone at the time of interpretation on 02/03/2019 at 8:49 pm to provider [facility] , who verbally acknowledged these results. Electronically Signed   By: Franchot Gallo M.D.   On: 02/03/2019 20:50   MR BRAIN W WO CONTRAST  Result Date: 02/04/2019 CLINICAL DATA:  Occurrence falls and weakness. History of multiple carcinomas. EXAM: MRI HEAD WITHOUT AND WITH CONTRAST TECHNIQUE: Multiplanar, multiecho pulse sequences of the brain and surrounding structures were obtained without and with intravenous contrast. CONTRAST:  28mL GADAVIST GADOBUTROL 1 MMOL/ML IV SOLN COMPARISON:   Head CT 02/03/2019 FINDINGS: Brain: There is a lesion of the right cerebellar hemisphere with nodular peripheral contrast enhancement, measuring 2.8 x 2.6 cm. There is associated hemorrhage is demonstrated on the earlier head CT. This causes moderate edema in the right cerebellar hemisphere with mass effect on the fourth ventricle. There is also a lesion of the superior cerebellar vermis that measures 1.5 x 1.4 cm. There are subcentimeter lesions of the left deep gray nuclei, left occipital lobe, left parietal white matter and left cerebellum. Early confluent hyperintense T2-weighted signal of the periventricular and deep white matter, most commonly due to chronic ischemic microangiopathy. Advanced atrophy for age. Blood-sensitive sequences show a single focus of chronic microhemorrhage in the right frontal white matter. The midline structures are normal. Vascular: Normal flow voids. Skull and upper cervical spine: Normal marrow signal. Sinuses/Orbits: Negative. Other: None. IMPRESSION: 1. Seven intracranial metastatic lesions, the largest of which is in the right cerebellar hemisphere and measures up to 2.6 cm with associated hemorrhage and moderate mass effect on the fourth ventricle. 2. Advanced atrophy and chronic ischemic microangiopathy. Lesions are annotated on series 11, axial postcontrast T1-weighted imaging. Electronically Signed   By: Ulyses Jarred M.D.   On: 02/04/2019 01:28   CT ABDOMEN PELVIS W CONTRAST  Result Date:  02/03/2019 CLINICAL DATA:  Increased weakness and falls for the last 3 days EXAM: CT CHEST, ABDOMEN, AND PELVIS WITH CONTRAST TECHNIQUE: Multidetector CT imaging of the chest, abdomen and pelvis was performed following the standard protocol during bolus administration of intravenous contrast. CONTRAST:  120mL OMNIPAQUE IOHEXOL 350 MG/ML SOLN COMPARISON:  CT chest, abdomen and pelvis 12/02/2015 FINDINGS: CT CHEST FINDINGS Cardiovascular: The aorta is normal caliber. No dissection flap  or other acute luminal abnormality of the aorta is seen. No periaortic stranding or hemorrhage. Atherosclerotic plaque within the normal caliber aorta. Normal 3 vessel branching of the arch. Central pulmonary arteries are normal caliber. No large central filling defects on this non tailored examination. Normal heart size. No pericardial effusion. Mediastinum/Nodes: No mediastinal fluid or hemorrhage. Physiologic amount of fluid present within the pericardial recesses. There is mediastinal and right hilar adenopathy. Findings include a centrally hypoattenuating 1.9 cm subcarinal nodule (4/27) and a 1.4 cm right hilar node with central hypoattenuation (4/27). There is a borderline enlarged right axillary node measuring 10 mm (4/32). Few nodular soft tissue densities in the upper-outer quadrant of the right breast with a more focal cutaneous based lesion in the lower inner quadrant measuring up to 2 cm in size. Lungs/Pleura: There is a multilobulated mass lesion abutting the pleura with acute angular margins in the medial aspect of the right middle lobe. This measures up to 3 cm in size (6/87). Smaller adjacent 9 mm satellite nodule is noted (6/76 few additional satellite nodules are noted adjacent the right hilar vessels and bronchi. Subpleural 5 mm nodule noted in the lateral left upper lobe (6/64). No acute traumatic abnormality of the lung parenchyma. No consolidation, features of edema, pneumothorax, or effusion. Musculoskeletal: Dedicated thoracic spine reconstructions were generated, please see that report for further details of the spine. No visible displaced rib fractures. No suspicious osseous lesions in the chest CT ABDOMEN PELVIS FINDINGS Hepatobiliary: No direct hepatic injury or perihepatic hematoma. Subcentimeter hypoattenuating focus in the posterior right lobe liver (3/2) is too small to fully characterize on CT imaging. No focal liver abnormality is seen. No gallstones, gallbladder wall thickening, or  biliary dilatation. Pancreas: Unremarkable. No pancreatic ductal dilatation or surrounding inflammatory changes. Spleen: Normal in size without focal abnormality. Adrenals/Urinary Tract: Irregular centrally hypoattenuating 3.3 cm lesion in the left adrenal gland. Additional hypertense 1.6 cm nodule in the lateral limb right adrenal gland. No convincing adrenal hemorrhage is seen however patient appears to be post left nephrectomy with some surgical material in the left nephrectomy bed. No right renal injury or perirenal hemorrhage. No suspicious renal lesion. Normal enhancement and excretion without extravasation of contrast from the collecting system on urinary phase delays. Mild bladder wall thickening. Faint perivesicular haze. No direct bladder injury. Stomach/Bowel: Distal esophagus, stomach and duodenal sweep are unremarkable. No small bowel wall thickening or dilatation. No evidence of obstruction. A normal appendix is visualized. No colonic dilatation or wall thickening. No sites of mesenteric hematoma or contusion. Vascular/Lymphatic: No direct vascular injury in the abdomen or pelvis. Few prominent retroperitoneal nodes are noted including a 8 mm periaortic lymph node (4/72). Reproductive: Patient is post hysterectomy. No concerning adnexal lesions. Postsurgical changes noted in the low anterior abdomen Other: Mild body wall edema. No large body wall hematoma. No bowel containing hernia. No traumatic abdominal wall injury or dehiscence. Musculoskeletal: Dedicated lumbar spine reconstructions were generated, please see that report for further details. No abnormalities of the bony pelvis. No intramuscular hematoma or other acute muscular abnormality is  seen. Slight age related atrophy is noted. IMPRESSION: 1. Compression deformities at T12 and L3, better detailed dedicated thoracolumbar spine reconstructions. Please see that report for further details. 2. No other evidence of acute traumatic injury to the  chest, abdomen or pelvis. 3. Lobulated mass lesion in the medial aspect of the right middle lobe with adjacent satellite nodules and mediastinal and right hilar adenopathy. 4. Soft tissue nodule in the lower inner quadrant of the right breast with additional nodularity in the upper-outer quadrant of the same breast. Should correlate with mammography and dedicated breast imaging. 5. Bilateral adrenal masses concerning for metastatic disease. 6. Subcentimeter hypoattenuating focus in the right lobe liver as well as some prominent though nonenlarged lymph nodes, while these are typically characterized as benign incidental findings, should be viewed with some suspicion given the findings above. 7. Mild bladder wall thickening with subtle perivesicular haze, which may represent cystitis. 8. Prior left nephrectomy. 9.  Aortic Atherosclerosis (ICD10-I70.0). These results were called by telephone at the time of interpretation on 02/03/2019 at 9:12 pm to provider [facility] , who verbally acknowledged these results. Electronically Signed   By: Lovena Le M.D.   On: 02/03/2019 21:14   CT T-SPINE NO CHARGE  Result Date: 02/03/2019 CLINICAL DATA:  Increasing weakness and multiple falls for 3 days EXAM: CT THORACIC AND LUMBAR SPINE WITHOUT CONTRAST TECHNIQUE: Multiplanar CT images of the thoracic and lumbar spine were reconstructed from contemporary CT of the Chest, abdomen and pelvis. CONTRAST:  None or No additional COMPARISON:  Chest radiograph 02/03/2019 CT abdomen pelvis 05/04/2015, chest radiograph 05/02/2017, lumbar radiographs 10/13/2018 FINDINGS: THORACIC SPINE: Alignment: There is straightening of the normal thoracic kyphosis throughout the lower thoracic levels with focal kyphotic curvature at the level of the T12 compression deformity. Mild dextrocurvature throughout the thoracic spine with an apex at the T11 level. No traumatic listhesis. No abnormal widened, jumped or perched facets. Vertebrae:  There is multilevel Schmorl's node formations. Anterior wedging compression deformity of T12 (AO spine A1) with approximately 50% height loss anteriorly some persistent lucency suggesting acuity. No other acute osseous injury or compression deformity is seen in the thoracic spine. No suspicious osseous lesions. Paraspinal and other soft tissues: Small amount paravertebral soft tissue thickening/stranding adjacent the T12 compression deformity. Finding favors acuity. For findings within the posterior chest and mediastinum, see dedicated CT from which this study is reconstructed. Disc levels: There is diffuse multilevel intervertebral disc height loss with discogenic endplate changes and some posterior disc osteophyte formation at T4-T5, T5-T6 and T7-T8 but without significant resulting canal stenosis or neural foraminal narrowing in the imaged thoracic spine. LUMBAR SPINE: Segmentation: 5 non-rib-bearing lumbar type vertebral levels. Alignment: There is slight levocurvature of the lumbar spine with an apex at L3. No abnormally widened, jumped or perched facets. Vertebrae: There is a split/pincer type compression deformity of L3 (AO spine A2). Central height loss of approximately 50%. No abnormal posterior wall involvement or retropulsion of fracture fragments. No additional acute fracture is seen. Posterior elements are intact. No suspicious osseous lesions. Multilevel Schmorl's node formations seen in the lumbar spine. Paraspinal and other soft tissues: Small amount of paravertebral soft tissue thickening at the L3 level suggests acuity. No visible canal hematoma. For findings in the posterior abdomen and pelvis, see dedicated CT from which this study is reconstructed. Disc levels: Multilevel intervertebral disc height loss throughout the lumbar spine with multilevel global disc bulges. Central disc protrusions at L2 and L3 result in mild canal stenosis. A  left central disc protrusion at L1 results in more moderate  canal narrowing. Facet hypertrophic changes are maximal at L5-S1 with resulting mild to moderate multilevel neural foraminal narrowing the lumbar spine. IMPRESSION: 1. Anterior wedging compression deformity T12 with 50% height loss anteriorly (AOSpineA1). 2. Split/Pincer type compression deformity of L3 with approximately 50% height loss anteriorly (AOSpine A2). 3. Faint paraspinal soft tissue thickening adjacent both these deformities suggest acuity. 4. No other acute osseous injuries in the thoracic or lumbar spine. 5. Multilevel degenerative changes of the thoracic and lumbar spine, as described above. 6. For findings in the posterior chest, abdomen and pelvis, see dedicated CT from which this study is reconstructed. Electronically Signed   By: Lovena Le M.D.   On: 02/03/2019 20:56   CT L-SPINE NO CHARGE  Result Date: 02/03/2019 CLINICAL DATA:  Increasing weakness and multiple falls for 3 days EXAM: CT THORACIC AND LUMBAR SPINE WITHOUT CONTRAST TECHNIQUE: Multiplanar CT images of the thoracic and lumbar spine were reconstructed from contemporary CT of the Chest, abdomen and pelvis. CONTRAST:  None or No additional COMPARISON:  Chest radiograph 02/03/2019 CT abdomen pelvis 05/04/2015, chest radiograph 05/02/2017, lumbar radiographs 10/13/2018 FINDINGS: THORACIC SPINE: Alignment: There is straightening of the normal thoracic kyphosis throughout the lower thoracic levels with focal kyphotic curvature at the level of the T12 compression deformity. Mild dextrocurvature throughout the thoracic spine with an apex at the T11 level. No traumatic listhesis. No abnormal widened, jumped or perched facets. Vertebrae: There is multilevel Schmorl's node formations. Anterior wedging compression deformity of T12 (AO spine A1) with approximately 50% height loss anteriorly some persistent lucency suggesting acuity. No other acute osseous injury or compression deformity is seen in the thoracic spine. No suspicious osseous  lesions. Paraspinal and other soft tissues: Small amount paravertebral soft tissue thickening/stranding adjacent the T12 compression deformity. Finding favors acuity. For findings within the posterior chest and mediastinum, see dedicated CT from which this study is reconstructed. Disc levels: There is diffuse multilevel intervertebral disc height loss with discogenic endplate changes and some posterior disc osteophyte formation at T4-T5, T5-T6 and T7-T8 but without significant resulting canal stenosis or neural foraminal narrowing in the imaged thoracic spine. LUMBAR SPINE: Segmentation: 5 non-rib-bearing lumbar type vertebral levels. Alignment: There is slight levocurvature of the lumbar spine with an apex at L3. No abnormally widened, jumped or perched facets. Vertebrae: There is a split/pincer type compression deformity of L3 (AO spine A2). Central height loss of approximately 50%. No abnormal posterior wall involvement or retropulsion of fracture fragments. No additional acute fracture is seen. Posterior elements are intact. No suspicious osseous lesions. Multilevel Schmorl's node formations seen in the lumbar spine. Paraspinal and other soft tissues: Small amount of paravertebral soft tissue thickening at the L3 level suggests acuity. No visible canal hematoma. For findings in the posterior abdomen and pelvis, see dedicated CT from which this study is reconstructed. Disc levels: Multilevel intervertebral disc height loss throughout the lumbar spine with multilevel global disc bulges. Central disc protrusions at L2 and L3 result in mild canal stenosis. A left central disc protrusion at L1 results in more moderate canal narrowing. Facet hypertrophic changes are maximal at L5-S1 with resulting mild to moderate multilevel neural foraminal narrowing the lumbar spine. IMPRESSION: 1. Anterior wedging compression deformity T12 with 50% height loss anteriorly (AOSpineA1). 2. Split/Pincer type compression deformity of L3  with approximately 50% height loss anteriorly (AOSpine A2). 3. Faint paraspinal soft tissue thickening adjacent both these deformities suggest acuity. 4. No other  acute osseous injuries in the thoracic or lumbar spine. 5. Multilevel degenerative changes of the thoracic and lumbar spine, as described above. 6. For findings in the posterior chest, abdomen and pelvis, see dedicated CT from which this study is reconstructed. Electronically Signed   By: Lovena Le M.D.   On: 02/03/2019 20:56   DG Shoulder Left  Result Date: 02/03/2019 CLINICAL DATA:  Shoulder pain EXAM: LEFT SHOULDER - 2+ VIEW COMPARISON:  None FINDINGS: There is no evidence of fracture or dislocation. There is no evidence of arthropathy or other focal bone abnormality. Soft tissues are unremarkable. IMPRESSION: Negative evaluation of the left shoulder. Electronically Signed   By: Zetta Bills M.D.   On: 02/03/2019 19:43   DG Hand Complete Left  Result Date: 02/03/2019 CLINICAL DATA:  Hand pain EXAM: LEFT HAND - COMPLETE 3+ VIEW COMPARISON:  None FINDINGS: Degenerative changes at the first carpometacarpal joint. No signs of fracture or dislocation. Soft tissues are unremarkable. IMPRESSION: Degenerative changes without fracture. Electronically Signed   By: Zetta Bills M.D.   On: 02/03/2019 19:42    Procedures Procedures (including critical care time)  Medications Ordered in ED Medications  busPIRone (BUSPAR) tablet 15 mg (15 mg Oral Given 02/04/19 2238)  irbesartan (AVAPRO) tablet 300 mg ( Oral MAR Unhold 02/04/19 1853)  rosuvastatin (CRESTOR) tablet 10 mg (10 mg Oral Given 02/04/19 2238)  buPROPion (WELLBUTRIN) tablet 200 mg ( Oral MAR Unhold 02/04/19 1853)  doxylamine (Sleep) (UNISOM) tablet 25 mg ( Oral MAR Unhold 02/04/19 1853)  sertraline (ZOLOFT) tablet 100 mg ( Oral MAR Unhold 02/04/19 1853)  traZODone (DESYREL) tablet 100 mg ( Oral MAR Unhold 02/04/19 1853)  levothyroxine (SYNTHROID) tablet 88 mcg ( Oral MAR Unhold  02/04/19 1853)  pramipexole (MIRAPEX) tablet 0.5 mg (0.5 mg Oral Given 02/04/19 2238)  dexamethasone (DECADRON) injection 4 mg (4 mg Intravenous Given 02/04/19 2346)  levETIRAcetam (KEPPRA) IVPB 500 mg/100 mL premix ( Intravenous Rate/Dose Verify 02/04/19 2300)  0.9 %  sodium chloride infusion ( Intravenous Stopped 02/04/19 2010)  oxyCODONE (Oxy IR/ROXICODONE) immediate release tablet 5 mg ( Oral MAR Unhold 02/04/19 1853)  docusate sodium (COLACE) capsule 100 mg (100 mg Oral Not Given 02/04/19 2253)  polyethylene glycol (MIRALAX / GLYCOLAX) packet 17 g ( Oral MAR Unhold 02/04/19 1853)  bisacodyl (DULCOLAX) suppository 10 mg ( Rectal MAR Unhold 02/04/19 1853)  sodium phosphate (FLEET) 7-19 GM/118ML enema 1 enema ( Rectal MAR Unhold 02/04/19 1853)  hydrALAZINE (APRESOLINE) injection 10 mg ( Intravenous MAR Unhold 02/04/19 1853)  0.9 %  sodium chloride infusion ( Intravenous New Bag/Given 02/04/19 1557)  0.9 %  sodium chloride infusion ( Intravenous Stopped 02/04/19 2252)  acetaminophen (TYLENOL) tablet 650 mg (has no administration in time range)    Or  acetaminophen (TYLENOL) suppository 650 mg (has no administration in time range)  HYDROcodone-acetaminophen (NORCO/VICODIN) 5-325 MG per tablet 1 tablet (has no administration in time range)  HYDROmorphone (DILAUDID) injection 0.5-1 mg (0.5 mg Intravenous Given 02/04/19 2025)  naloxone (NARCAN) injection 0.08 mg (has no administration in time range)  senna (SENOKOT) tablet 8.6 mg (8.6 mg Oral Not Given 02/04/19 2253)  ondansetron (ZOFRAN) tablet 4 mg (has no administration in time range)    Or  ondansetron (ZOFRAN) injection 4 mg (has no administration in time range)  promethazine (PHENERGAN) tablet 12.5-25 mg (has no administration in time range)  labetalol (NORMODYNE) injection 10-40 mg (has no administration in time range)  pantoprazole (PROTONIX) injection 40 mg (40 mg Intravenous Given 02/04/19 2242)  fentaNYL (SUBLIMAZE) 100 MCG/2ML  injection (has no administration in time range)  fentaNYL (SUBLIMAZE) 100 MCG/2ML injection (has no administration in time range)  vancomycin (VANCOREADY) IVPB 750 mg/150 mL (750 mg Intravenous New Bag/Given 02/04/19 2352)  Chlorhexidine Gluconate Cloth 2 % PADS 6 each (has no administration in time range)  lactated ringers bolus 1,000 mL (0 mLs Intravenous Stopped 02/03/19 2042)  fentaNYL (SUBLIMAZE) injection 50 mcg (50 mcg Intravenous Given 02/03/19 2030)  potassium chloride (KLOR-CON) packet 40 mEq (40 mEq Oral Given 02/03/19 2042)  potassium chloride 10 mEq in 100 mL IVPB (0 mEq Intravenous Stopped 02/03/19 2155)  iohexol (OMNIPAQUE) 350 MG/ML injection 100 mL (100 mLs Intravenous Contrast Given 02/03/19 2019)  dexamethasone (DECADRON) injection 10 mg (10 mg Intravenous Given 02/03/19 2215)  gadobutrol (GADAVIST) 1 MMOL/ML injection 7 mL (7 mLs Intravenous Contrast Given 02/04/19 0113)  morphine 2 MG/ML injection 1 mg (1 mg Intravenous Given 02/04/19 0438)  potassium chloride 10 mEq in 100 mL IVPB (0 mEq Intravenous Stopped 02/04/19 0747)  0.9 %  sodium chloride infusion (Manually program via Guardrails IV Fluids) ( Intravenous New Bag/Given 02/04/19 1858)    ED Course  I have reviewed the triage vital signs and the nursing notes.  Pertinent labs & imaging results that were available during my care of the patient were reviewed by me and considered in my medical decision making (see chart for details).    MDM Rules/Calculators/A&P                      Jakaria DESTONI LUNDAY is a 63 y.o. female with above medical history who presents to the ED for progressive and worrisome neurologic symptoms including increasing weakness, falls, confusion, urinary incontinence. [place] today, ground level and fell hitting her head against a trash can. Complains of headache, left rib pain and back pain. She does not take blood thinners.  HPI and physical exam as above. She presents awake, slightly drowsy,  hemodynamically stable, afebrile. Her neurologic exam is non focal aside from gait imbalance and abnormal proprioception. Trauma and medical workup for her fall and worsening weakness and confusion.  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 She also has hydrocephalus. C/A/P demonstrates:  1. Compression deformities at T12 and L3, better detailed dedicated thoracolumbar spine reconstructions. Please see that report for further details. 2. No other evidence of acute traumatic injury to the chest, abdomen or pelvis. 3. Lobulated mass lesion in the medial aspect of the right middle lobe with adjacent satellite nodules and mediastinal and right hilar adenopathy. 4. Soft tissue nodule in the lower inner quadrant of the right breast with additional nodularity in the upper-outer quadrant of the same breast. Should correlate with mammography and dedicated breast imaging. 5. Bilateral adrenal masses concerning for metastatic disease. 6. Subcentimeter hypoattenuating focus in the right lobe liver as well as some prominent though nonenlarged lymph nodes, while these are typically characterized as benign incidental findings, should be viewed with some suspicion given the findings above. 7. Mild bladder wall thickening with subtle perivesicular haze, which may represent cystitis. 8. Prior left nephrectomy.  Laboratory work significant for elevated TSH, concerning for hypothyroidism. Also hypokalemia of 2.7  Assessment: 63 year old with prior cancer who recently has worsening weakness and falls found today to have cerebellar mass, hydrocephalus, T12 and L3 compression fracture, right middle lobe lung mass, right breast mass, adrenal masses, focal liver lesion, bladder wall thickening all of which are concerning for metastatic process. Also  noted to have hypothyroidism and hypokalemia.  Consulted neurosurgery for evaluation who will admit her with a medicine consult.  Neurosurgery tentatively plans to remove her cerebellar mass tomorrow. I discussed this plan with the patient and family, they understand and agree with plan for admission.    Final Clinical Impression(s) / ED Diagnoses Final diagnoses:  Weakness  Back pain    Rx / DC Orders ED Discharge Orders    None       Lindey Renzulli, Lovena Le, MD 02/05/19 0007    Tegeler, Gwenyth Allegra, MD 02/05/19 [phone]

## 2019-02-03 NOTE — H&P (Signed)
Chief Complaint   Chief Complaint  Patient presents with  . Fall    HPI   Consult requested by: [facility] Reason for consult: cerebellar mass  HPI: Krista Hodges is a 63 y.o. female with history of HTN, depression, HLD and multiple cancers including ?RCC s/p nephrectomy around 2015, endometrial adenocarcinoma 2017, papillary thyroid carcinoma 2019 s/p radiation who presented to the ED for recurrent falls and weakness. Her daughter is at bedside and assists with history.   4 months ago family started to notice patient with repetitive questioning and intermittent short term memory loss. She was referred to neurology and underwent work up and she was ultimately diagnosed with dementia. She had been doing well and essentially at her baseline up until 3 days ago when family noticed increased confusion, gait instability causing recurrent falls (6 in last 3 days) and intermittent urinary incontinence. She is essentially wheelchair bound right now due to gait instability. Her most recent fall was today. She fell striking head on garbage can. Also injured back. She has also been forgetting to take her daily meds and eat/drink per daughter. She underwent work up by EDP which included a head CT which revealed a right cerebellar mass. [facility] called for admission.   She complains of midline lower thoracic/lumbar back pain. No radicular symptoms. No weakness in less. Describes difficulties walking due to feeling off balance. Has not noticed confusion issues. Not on blood thinning agents. Per daughter is up to date on mammogram, colonoscopy.  ED course As part of work up due to new cerebellar mass, CT chest/abd/pelvis was also obtained. Primary findings:   - T12, L3 fracture  - Right middle lobe mass with satellite nodules, mediastinal and right hilar adenopathy  - Lower inner quadrant right breasts, nodules in right UO quadrant  - bilateral adrenal masses  - ?cystitis  Patient Active Problem List   Diagnosis Date Noted  . Papillary thyroid carcinoma ([place]) 05/05/2017  . Tremor 01/31/2017  . Left renal mass 05/21/2014  . Idiopathic acute pancreatitis   . Urinary tract infectious disease   . Pancreatic mass   . Other acute pancreatitis   . Left kidney mass   . Acute pancreatitis 04/03/2014  . PVC (premature ventricular contraction) 10/20/2013  . Dyspnea 10/20/2013  . Allergic rhinitis 10/16/2013  . Anxiety state 10/16/2013  . Enthesopathy of hip 10/16/2013  . Cervical radiculitis 10/16/2013  . 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 CFIDS (chronic fatigue and immune dysfunction syndrome) ([place]) 10/16/2013  . Inflammation of hand joint 10/16/2013  . Atypical migraine 10/16/2013  . Excessive sweating, local 10/16/2013  . HLD (hyperlipidemia) 10/16/2013  . Depression, major, single episode, in partial remission ([place]) 10/16/2013  . Amnesia 10/16/2013  . Adiposity 10/16/2013  . Arthritis, degenerative 10/16/2013  . Restless leg 10/16/2013  . Adaptation reaction 10/16/2013  . Thrombophlebitis of superficial veins of lower extremity 10/16/2013  . Essential (primary) hypertension 10/16/2013  . Avitaminosis D 10/16/2013  . Awareness of heartbeats 09/16/2013  . Premature complex, ventricular 09/16/2013    PMH: Past Medical History:  Diagnosis Date  . Allergic rhinitis   . Anxiety state   . Arthritis, degenerative   . Atypical migraine   . Awareness of heartbeats   . Cancer of left kidney (HCC)    Left Kidney  . Cervical radiculitis   . CFIDS (chronic fatigue and immune dysfunction syndrome) ([place])   . Depression, major, single episode, in partial remission ([place])   . Depression, neurotic   .  Elevated serum creatinine 03/2016   history of (2.07)  . Endometrial adenocarcinoma ([place])    Stage II grade2  . Essential (primary) hypertension   . Gastric ulcer   . Heart murmur    pt. denies  . HLD (hyperlipidemia)   . Inflammation of hand joint   . Memory difficulties   .  Premature complex, ventricular   . Renal disorder   . Renal mass, left   . Restless leg   . Shortness of breath dyspnea    occasional  . 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 Tremor    L arm  . Vitamin D deficiency     PSH: Past Surgical History:  Procedure Laterality Date  . c sections x 3     . CARPAL TUNNEL RELEASE     rt hand  . LAPAROSCOPIC NEPHRECTOMY Left 05/21/2014   Procedure: LEFT RADICAL LAPAROSCOPIC NEPHRECTOMY;  Surgeon: Ardis Hughs, MD;  Location: [facility];  Service: Urology;  Laterality: Left;  Marland Kitchen MANDIBLE SURGERY    . NEPHRECTOMY TRANSPLANTED ORGAN     Left kidney removed  . ROBOTIC ASSISTED TOTAL HYSTERECTOMY WITH BILATERAL SALPINGO OOPHERECTOMY  12/27/2015  . THYROIDECTOMY N/A 05/09/2017   Procedure: TOTAL THYROIDECTOMY WITH LIMITED CENTRAL COMPARTMENT LYMPH NODE DISSECTION;  Surgeon: Armandina Gemma, MD;  Location: [facility];  Service: General;  Laterality: N/A;  . TOTAL THYROIDECTOMY     05-09-17 Dr. Harlow Asa    (Not in a hospital admission)   SH: Social History   Tobacco Use  . Smoking status: Former Smoker    Packs/day: 1.00    Types: Cigarettes    Quit date: 05/03/2007    Years since quitting: 11.7  . Smokeless tobacco: Never Used  Substance Use Topics  . Alcohol use: Yes    Comment: occ  . Drug use: No    MEDS: Prior to Admission medications   Medication Sig Start Date End Date Taking? Authorizing Provider  acetaminophen (TYLENOL) 500 MG tablet Take 500-1,000 mg by mouth daily as needed for moderate pain or headache.    [provider]  buPROPion (WELLBUTRIN) 100 MG tablet Take 200 mg by mouth daily.    [provider]  busPIRone (BUSPAR) 15 MG tablet Take 15 mg by mouth 2 (two) times daily.    [provider]  calcium carbonate (TUMS) 500 MG chewable tablet Chew 2 tablets (400 mg of elemental calcium total) by mouth 2 (two) times daily. 05/10/17   Armandina Gemma, MD  Cholecalciferol (VITAMIN D) 2000  units tablet Take 2,000 Units by mouth daily.    [provider]  docusate sodium (COLACE) 100 MG capsule Take 1 capsule (100 mg total) by mouth 2 (two) times daily as needed (take to keep stool soft.). Patient taking differently: Take 100 mg by mouth daily as needed (take to keep stool soft.).  05/21/14   Ardis Hughs, MD  doxylamine, Sleep, (UNISOM) 25 MG tablet Take 25 mg by mouth at bedtime as needed for sleep.    [provider]  hydrochlorothiazide (HYDRODIURIL) 25 MG tablet Take 25 mg by mouth daily.    [provider]  irbesartan (AVAPRO) 300 MG tablet Take 300 mg daily by mouth.     [provider]  levothyroxine (SYNTHROID) 88 MCG tablet Take 1 tablet (88 mcg total) by mouth daily before breakfast. 05/10/17   Armandina Gemma, MD  Multiple Vitamins-Minerals (MULTIVITAMIN GUMMIES ADULT) CHEW Chew 2 tablets by mouth daily.  [provider]  pramipexole (MIRAPEX) 0.5 MG tablet Take 0.5 mg by mouth at bedtime.    [provider]  Probiotic Product (PROBIOTIC PO) Take 1 capsule by mouth daily.     [provider]  Propylene Glycol (SYSTANE BALANCE) 0.6 % SOLN Place 1 drop into both eyes daily.    [provider]  rosuvastatin (CRESTOR) 10 MG tablet Take 10 mg by mouth at bedtime.     [provider]  sertraline (ZOLOFT) 50 MG tablet Take 100 mg by mouth daily.    [provider]  traMADol (ULTRAM) 50 MG tablet Take 50-100 mg by mouth 2 (two) times daily as needed for moderate pain.     [provider]  traMADol (ULTRAM) 50 MG tablet Take 1-2 tablets (50-100 mg total) by mouth every 6 (six) hours as needed for moderate pain (mild pain not responsive to acetaminophen). 05/10/17   Armandina Gemma, MD  traZODone (DESYREL) 50 MG tablet Take 50-100 mg by mouth at bedtime as needed for sleep.     [provider]  vitamin C (ASCORBIC ACID) 250 MG tablet Take 500 mg by mouth daily.    [provider]    ALLERGY: Allergies  Allergen Reactions  . Penicillins Hives    Whelts. Has tolerated cephazolin and cephalexin. Has patient had a PCN reaction causing immediate rash, facial/tongue/throat swelling, SOB or lightheadedness with hypotension: Yes (per pt report) Has patient had a PCN reaction causing severe rash involving mucus membranes or skin necrosis: Yes Has patient had a PCN reaction that required hospitalization: No Has patient had a PCN reaction occurring within the last 10 years: No If all of the above answers are "NO", then may proceed with Cephalosporin  . Aspirin     Cannot take due to nephrectomy  . Keppra [Levetiracetam] Nausea Only    Anxiety   . Nsaids     Cannot take due to nephrectomy  . Requip [Ropinirole Hcl]     Headaches, restlessness    Social History   Tobacco Use  . Smoking status: Former Smoker    Packs/day: 1.00    Types: Cigarettes    Quit date: 05/03/2007    Years since quitting: 11.7  . Smokeless tobacco: Never Used  Substance Use Topics  . Alcohol use: Yes    Comment: occ     Family History  Problem Relation Age of Onset  . Cancer Mother   . Heart attack Father   . Diabetes Other   . Dementia Neg Hx      ROS   Review of Systems  Constitutional: Negative.   HENT: Negative.   Eyes: Negative for blurred vision, double vision and photophobia.  Respiratory: Negative.   Cardiovascular: Negative.   Gastrointestinal: Negative for nausea and vomiting.  Genitourinary: Negative for dysuria and urgency.       Intermittent urinary incontinence  Musculoskeletal: Positive for back pain and falls. Negative for myalgias and neck pain.  Skin: Negative.   Neurological: Positive for weakness and headaches. Negative for dizziness, tingling, tremors, sensory change, speech change and focal weakness.    Exam   Vitals:   02/03/19 1815 02/03/19 2115  BP: 118/62 122/80  Pulse: 62 (!) 59  Resp: 15 18  SpO2: 98% 98%   General  appearance: resting comfortable on stretcher, NAD Eyes: No scleral injection Cardiovascular: Regular rate and rhythm without murmurs, rubs, gallops. No edema or variciosities. Distal pulses normal. Pulmonary: Effort normal, non-labored breathing Musculoskeletal:  Muscle tone upper extremities: Normal    Muscle tone lower extremities: Normal    Motor exam: Upper Extremities Deltoid Bicep Tricep Grip  Right 5/5 5/5 5/5 5/5  Left 5/5 5/5 5/5 5/5   Lower Extremity IP Quad PF DF EHL  Right 5/5 5/5 5/5 5/5 5/5  Left 5/5 5/5 5/5 5/5 5/5   Neurological Mental Status:    - Patient is awake, alert, oriented to person, place, gets year wrong at times    - No signs of aphasia or neglect Cranial Nerves    - II: Visual Fields are full. PERRL    - III/IV/VI: EOMI without ptosis or diploplia.     - V: Facial sensation is grossly normal    - VII: Facial movement is symmetric.     - VIII: hearing is intact to voice    - X: Uvula elevates symmetrically    - XI: Shoulder shrug is symmetric.    - XII: tongue is midline without atrophy or fasciculations.  Sensory: Sensation grossly intact to LT Cerebellar    - FNF abnormally, L>R Difficulties with complex commands Difficulties with repetition   Results - Imaging/Labs   Results for orders placed or performed during the hospital encounter of 02/03/19 (from the past 48 hour(s))  CBG monitoring, ED     Status: Abnormal   Collection Time: 02/03/19  6:03 PM  Result Value Ref Range   Glucose-Capillary 165 (H) 70 - 99 mg/dL  Comprehensive metabolic panel     Status: Abnormal   Collection Time: 02/03/19  6:27 PM  Result Value Ref Range   Sodium 135 135 - 145 mmol/L   Potassium 2.7 (LL) 3.5 - 5.1 mmol/L    Comment: CRITICAL RESULT CALLED TO, READ BACK BY AND VERIFIED WITH: C TEGLER RN AT 1934 ON [id number] BY K FORSYTH    Chloride 98 98 - 111 mmol/L   CO2 26 22 - 32 mmol/L   Glucose, Bld 154 (H) 70 - 99 mg/dL   BUN 18 8 - 23 mg/dL    Creatinine, Ser 1.04 (H) 0.44 - 1.00 mg/dL   Calcium 9.5 8.9 - 10.3 mg/dL   Total Protein 6.3 (L) 6.5 - 8.1 g/dL   Albumin 3.2 (L) 3.5 - 5.0 g/dL   AST 16 15 - 41 U/L   ALT 12 0 - 44 U/L   Alkaline Phosphatase 103 38 - 126 U/L   Total Bilirubin 0.5 0.3 - 1.2 mg/dL   GFR calc non Af Amer 56 (L) >60 mL/min   GFR calc Af Amer >60 >60 mL/min   Anion gap 11 5 - 15    Comment: Performed at [facility], [address] [place], [place] [address]  CBC with Differential     Status: Abnormal   Collection Time: 02/03/19  6:27 PM  Result Value Ref Range   WBC 13.0 (H) 4.0 - 10.5 K/uL   RBC 3.58 (L) 3.87 - 5.11 MIL/uL   Hemoglobin 11.0 (L) 12.0 - 15.0 g/dL   HCT 34.4 (L) 36.0 - 46.0 %   MCV 96.1 80.0 - 100.0 fL   MCH 30.7 26.0 - 34.0 pg   MCHC 32.0 30.0 - 36.0 g/dL   RDW 12.7 11.5 - 15.5 %   Platelets 332 150 - 400 K/uL   nRBC 0.0 0.0 - 0.2 %   Neutrophils Relative % 91 %   Neutro Abs 11.9 (H) 1.7 - 7.7 K/uL   Lymphocytes Relative 2 %   Lymphs  Abs 0.3 (L) 0.7 - 4.0 K/uL   Monocytes Relative 6 %   Monocytes Absolute 0.7 0.1 - 1.0 K/uL   Eosinophils Relative 0 %   Eosinophils Absolute 0.0 0.0 - 0.5 K/uL   Basophils Relative 0 %   Basophils Absolute 0.0 0.0 - 0.1 K/uL   Immature Granulocytes 1 %   Abs Immature Granulocytes 0.12 (H) 0.00 - 0.07 K/uL    Comment: Performed at [facility] [address] [place], [place] [address]  CK     Status: None   Collection Time: 02/03/19  6:27 PM  Result Value Ref Range   Total CK 46 38 - 234 U/L    Comment: Performed at [facility], [place] [address], [place], [place] [address]  TSH     Status: Abnormal   Collection Time: 02/03/19  6:54 PM  Result Value Ref Range   TSH 75.666 (H) 0.350 - 4.500 uIU/mL    Comment: Performed by a 3rd Generation assay with a functional sensitivity of <=0.01 uIU/mL. Performed at [facility], [place] [address] [place], [place] [address]     DG Chest 1 View  Result Date:  02/03/2019 CLINICAL DATA:  Generalized weakness after falling EXAM: CHEST  1 VIEW COMPARISON:  05/02/2017 FINDINGS: Cardiomediastinal contours are normal accounting for portable technique. Lungs are clear. Postoperative changes are noted in the low neck. Visualized skeletal structures are unremarkable. IMPRESSION: No acute cardiopulmonary disease. Electronically Signed   By: Zetta Bills M.D.   On: 02/03/2019 19:40   DG Forearm Right  Result Date: 02/03/2019 CLINICAL DATA:  Right forearm pain, left hand pain, left shoulder pain and generalized weakness. EXAM: RIGHT FOREARM - 2 VIEW COMPARISON:  None FINDINGS: There is no evidence of fracture or other focal bone lesions. Soft tissues are unremarkable. IMPRESSION: Negative evaluation of the right forearm Electronically Signed   By: Zetta Bills M.D.   On: 02/03/2019 19:39   CT Head Wo Contrast  Result Date: 02/03/2019 CLINICAL DATA:  Weakness and falls over last 3 days, history of renal cell carcinoma EXAM: CT HEAD WITHOUT CONTRAST TECHNIQUE: Contiguous axial images were obtained from the base of the skull through the vertex without intravenous contrast. COMPARISON:  None. FINDINGS: Brain: There is a round hemorrhagic mass seen within the posterior right cerebellum measuring 2.8 cm. The mass a significant surrounding edema causes mass effect upon the posterior fourth ventricle and left cerebellum. There is also edema extending into the left cerebellum. There is dilatation the ventricles and sulci consistent with age-related atrophy. Low-attenuation changes in the deep white matter consistent with small vessel ischemia. Vascular: No hyperdense vessel or unexpected calcification. Skull: The skull is intact. No fracture or focal lesion identified. Sinuses/Orbits: The visualized paranasal sinuses and mastoid air cells are clear. The orbits and globes intact. Other: None IMPRESSION: Hemorrhagic 2.8 cm mass within the right cerebellum with surrounding  vasogenic edema causing mass effect upon the fourth ventricle and left cerebellum. There is dilatation the ventricles and sulci consistent with age-related atrophy. Low-attenuation changes in the deep white matter consistent with small vessel ischemia. These results were called by telephone at the time of interpretation on 02/03/2019 at 8:46 pm to provider [facility] , who verbally acknowledged these results. Electronically Signed   By: Prudencio Pair M.D.   On: 02/03/2019 20:54   CT Chest W Contrast  Result Date: 02/03/2019 CLINICAL DATA:  Increased weakness and falls for the last 3 days EXAM: CT CHEST,  ABDOMEN, AND PELVIS WITH CONTRAST TECHNIQUE: Multidetector CT imaging of the chest, abdomen and pelvis was performed following the standard protocol during bolus administration of intravenous contrast. CONTRAST:  153mL OMNIPAQUE IOHEXOL 350 MG/ML SOLN COMPARISON:  CT chest, abdomen and pelvis 12/02/2015 FINDINGS: CT CHEST FINDINGS Cardiovascular: The aorta is normal caliber. No dissection flap or other acute luminal abnormality of the aorta is seen. No periaortic stranding or hemorrhage. Atherosclerotic plaque within the normal caliber aorta. Normal 3 vessel branching of the arch. Central pulmonary arteries are normal caliber. No large central filling defects on this non tailored examination. Normal heart size. No pericardial effusion. Mediastinum/Nodes: No mediastinal fluid or hemorrhage. Physiologic amount of fluid present within the pericardial recesses. There is mediastinal and right hilar adenopathy. Findings include a centrally hypoattenuating 1.9 cm subcarinal nodule (4/27) and a 1.4 cm right hilar node with central hypoattenuation (4/27). There is a borderline enlarged right axillary node measuring 10 mm (4/32). Few nodular soft tissue densities in the upper-outer quadrant of the right breast with a more focal cutaneous based lesion in the lower inner quadrant measuring up to 2 cm in size.  Lungs/Pleura: There is a multilobulated mass lesion abutting the pleura with acute angular margins in the medial aspect of the right middle lobe. This measures up to 3 cm in size (6/87). Smaller adjacent 9 mm satellite nodule is noted (6/76 few additional satellite nodules are noted adjacent the right hilar vessels and bronchi. Subpleural 5 mm nodule noted in the lateral left upper lobe (6/64). No acute traumatic abnormality of the lung parenchyma. No consolidation, features of edema, pneumothorax, or effusion. Musculoskeletal: Dedicated thoracic spine reconstructions were generated, please see that report for further details of the spine. No visible displaced rib fractures. No suspicious osseous lesions in the chest CT ABDOMEN PELVIS FINDINGS Hepatobiliary: No direct hepatic injury or perihepatic hematoma. Subcentimeter hypoattenuating focus in the posterior right lobe liver (3/2) is too small to fully characterize on CT imaging. No focal liver abnormality is seen. No gallstones, gallbladder wall thickening, or biliary dilatation. Pancreas: Unremarkable. No pancreatic ductal dilatation or surrounding inflammatory changes. Spleen: Normal in size without focal abnormality. Adrenals/Urinary Tract: Irregular centrally hypoattenuating 3.3 cm lesion in the left adrenal gland. Additional hypertense 1.6 cm nodule in the lateral limb right adrenal gland. No convincing adrenal hemorrhage is seen however patient appears to be post left nephrectomy with some surgical material in the left nephrectomy bed. No right renal injury or perirenal hemorrhage. No suspicious renal lesion. Normal enhancement and excretion without extravasation of contrast from the collecting system on urinary phase delays. Mild bladder wall thickening. Faint perivesicular haze. No direct bladder injury. Stomach/Bowel: Distal esophagus, stomach and duodenal sweep are unremarkable. No small bowel wall thickening or dilatation. No evidence of obstruction. A  normal appendix is visualized. No colonic dilatation or wall thickening. No sites of mesenteric hematoma or contusion. Vascular/Lymphatic: No direct vascular injury in the abdomen or pelvis. Few prominent retroperitoneal nodes are noted including a 8 mm periaortic lymph node (4/72). Reproductive: Patient is post hysterectomy. No concerning adnexal lesions. Postsurgical changes noted in the low anterior abdomen Other: Mild body wall edema. No large body wall hematoma. No bowel containing hernia. No traumatic abdominal wall injury or dehiscence. Musculoskeletal: Dedicated lumbar spine reconstructions were generated, please see that report for further details. No abnormalities of the bony pelvis. No intramuscular hematoma or other acute muscular abnormality is seen. Slight age related atrophy is noted. IMPRESSION: 1. Compression deformities at T12 and L3, better  detailed dedicated thoracolumbar spine reconstructions. Please see that report for further details. 2. No other evidence of acute traumatic injury to the chest, abdomen or pelvis. 3. Lobulated mass lesion in the medial aspect of the right middle lobe with adjacent satellite nodules and mediastinal and right hilar adenopathy. 4. Soft tissue nodule in the lower inner quadrant of the right breast with additional nodularity in the upper-outer quadrant of the same breast. Should correlate with mammography and dedicated breast imaging. 5. Bilateral adrenal masses concerning for metastatic disease. 6. Subcentimeter hypoattenuating focus in the right lobe liver as well as some prominent though nonenlarged lymph nodes, while these are typically characterized as benign incidental findings, should be viewed with some suspicion given the findings above. 7. Mild bladder wall thickening with subtle perivesicular haze, which may represent cystitis. 8. Prior left nephrectomy. 9.  Aortic Atherosclerosis (ICD10-I70.0). These results were called by telephone at the time of  interpretation on 02/03/2019 at 9:12 pm to provider [facility] [place] , who verbally acknowledged these results. Electronically Signed   By: Lovena Le M.D.   On: 02/03/2019 21:14   CT Cervical Spine Wo Contrast  Result Date: 02/03/2019 CLINICAL DATA:  Fall. EXAM: CT CERVICAL SPINE WITHOUT CONTRAST TECHNIQUE: Multidetector CT imaging of the cervical spine was performed without intravenous contrast. Multiplanar CT image reconstructions were also generated. COMPARISON:  MRI cervical spine 12/27/2011 FINDINGS: Alignment: Normal Skull base and vertebrae: Negative for fracture Soft tissues and spinal canal: Negative for soft tissue mass or swelling. Prior thyroidectomy. Disc levels: Disc degeneration and spurring multiple levels. Bilateral foraminal stenosis at C3-4, C4-5, C5-6, C6-7 due to spurring. Upper chest: Lung apices clear bilaterally. Other: There is mass-effect on the posterior fossa with effacement of the fourth ventricle and obstructive hydrocephalus. There is question of high-density subdural blood in the right posterior fossa. This area is not well evaluated due to streak artifact. Recommend stat head CT. IMPRESSION: Abnormal mass-effect in the posterior fossa with obstructive hydrocephalus. Question subdural hematoma or mass in the posterior fossa on the right. Recommend stat head CT. Cervical spondylosis without fracture These results were called by telephone at the time of interpretation on 02/03/2019 at 8:49 pm to provider [facility] , who verbally acknowledged these results. Electronically Signed   By: Franchot Gallo M.D.   On: 02/03/2019 20:50   CT ABDOMEN PELVIS W CONTRAST  Result Date: 02/03/2019 CLINICAL DATA:  Increased weakness and falls for the last 3 days EXAM: CT CHEST, ABDOMEN, AND PELVIS WITH CONTRAST TECHNIQUE: Multidetector CT imaging of the chest, abdomen and pelvis was performed following the standard protocol during bolus administration of intravenous contrast.  CONTRAST:  171mL OMNIPAQUE IOHEXOL 350 MG/ML SOLN COMPARISON:  CT chest, abdomen and pelvis 12/02/2015 FINDINGS: CT CHEST FINDINGS Cardiovascular: The aorta is normal caliber. No dissection flap or other acute luminal abnormality of the aorta is seen. No periaortic stranding or hemorrhage. Atherosclerotic plaque within the normal caliber aorta. Normal 3 vessel branching of the arch. Central pulmonary arteries are normal caliber. No large central filling defects on this non tailored examination. Normal heart size. No pericardial effusion. Mediastinum/Nodes: No mediastinal fluid or hemorrhage. Physiologic amount of fluid present within the pericardial recesses. There is mediastinal and right hilar adenopathy. Findings include a centrally hypoattenuating 1.9 cm subcarinal nodule (4/27) and a 1.4 cm right hilar node with central hypoattenuation (4/27). There is a borderline enlarged right axillary node measuring 10 mm (4/32). Few nodular soft tissue densities in the upper-outer quadrant of the right breast  with a more focal cutaneous based lesion in the lower inner quadrant measuring up to 2 cm in size. Lungs/Pleura: There is a multilobulated mass lesion abutting the pleura with acute angular margins in the medial aspect of the right middle lobe. This measures up to 3 cm in size (6/87). Smaller adjacent 9 mm satellite nodule is noted (6/76 few additional satellite nodules are noted adjacent the right hilar vessels and bronchi. Subpleural 5 mm nodule noted in the lateral left upper lobe (6/64). No acute traumatic abnormality of the lung parenchyma. No consolidation, features of edema, pneumothorax, or effusion. Musculoskeletal: Dedicated thoracic spine reconstructions were generated, please see that report for further details of the spine. No visible displaced rib fractures. No suspicious osseous lesions in the chest CT ABDOMEN PELVIS FINDINGS Hepatobiliary: No direct hepatic injury or perihepatic hematoma. Subcentimeter  hypoattenuating focus in the posterior right lobe liver (3/2) is too small to fully characterize on CT imaging. No focal liver abnormality is seen. No gallstones, gallbladder wall thickening, or biliary dilatation. Pancreas: Unremarkable. No pancreatic ductal dilatation or surrounding inflammatory changes. Spleen: Normal in size without focal abnormality. Adrenals/Urinary Tract: Irregular centrally hypoattenuating 3.3 cm lesion in the left adrenal gland. Additional hypertense 1.6 cm nodule in the lateral limb right adrenal gland. No convincing adrenal hemorrhage is seen however patient appears to be post left nephrectomy with some surgical material in the left nephrectomy bed. No right renal injury or perirenal hemorrhage. No suspicious renal lesion. Normal enhancement and excretion without extravasation of contrast from the collecting system on urinary phase delays. Mild bladder wall thickening. Faint perivesicular haze. No direct bladder injury. Stomach/Bowel: Distal esophagus, stomach and duodenal sweep are unremarkable. No small bowel wall thickening or dilatation. No evidence of obstruction. A normal appendix is visualized. No colonic dilatation or wall thickening. No sites of mesenteric hematoma or contusion. Vascular/Lymphatic: No direct vascular injury in the abdomen or pelvis. Few prominent retroperitoneal nodes are noted including a 8 mm periaortic lymph node (4/72). Reproductive: Patient is post hysterectomy. No concerning adnexal lesions. Postsurgical changes noted in the low anterior abdomen Other: Mild body wall edema. No large body wall hematoma. No bowel containing hernia. No traumatic abdominal wall injury or dehiscence. Musculoskeletal: Dedicated lumbar spine reconstructions were generated, please see that report for further details. No abnormalities of the bony pelvis. No intramuscular hematoma or other acute muscular abnormality is seen. Slight age related atrophy is noted. IMPRESSION: 1.  Compression deformities at T12 and L3, better detailed dedicated thoracolumbar spine reconstructions. Please see that report for further details. 2. No other evidence of acute traumatic injury to the chest, abdomen or pelvis. 3. Lobulated mass lesion in the medial aspect of the right middle lobe with adjacent satellite nodules and mediastinal and right hilar adenopathy. 4. Soft tissue nodule in the lower inner quadrant of the right breast with additional nodularity in the upper-outer quadrant of the same breast. Should correlate with mammography and dedicated breast imaging. 5. Bilateral adrenal masses concerning for metastatic disease. 6. Subcentimeter hypoattenuating focus in the right lobe liver as well as some prominent though nonenlarged lymph nodes, while these are typically characterized as benign incidental findings, should be viewed with some suspicion given the findings above. 7. Mild bladder wall thickening with subtle perivesicular haze, which may represent cystitis. 8. Prior left nephrectomy. 9.  Aortic Atherosclerosis (ICD10-I70.0). These results were called by telephone at the time of interpretation on 02/03/2019 at 9:12 pm to provider [facility] , who verbally acknowledged these results. Electronically  Signed   By: Lovena Le M.D.   On: 02/03/2019 21:14   CT T-SPINE NO CHARGE  Result Date: 02/03/2019 CLINICAL DATA:  Increasing weakness and multiple falls for 3 days EXAM: CT THORACIC AND LUMBAR SPINE WITHOUT CONTRAST TECHNIQUE: Multiplanar CT images of the thoracic and lumbar spine were reconstructed from contemporary CT of the Chest, abdomen and pelvis. CONTRAST:  None or No additional COMPARISON:  Chest radiograph 02/03/2019 CT abdomen pelvis 05/04/2015, chest radiograph 05/02/2017, lumbar radiographs 10/13/2018 FINDINGS: THORACIC SPINE: Alignment: There is straightening of the normal thoracic kyphosis throughout the lower thoracic levels with focal kyphotic curvature at the level of  the T12 compression deformity. Mild dextrocurvature throughout the thoracic spine with an apex at the T11 level. No traumatic listhesis. No abnormal widened, jumped or perched facets. Vertebrae: There is multilevel Schmorl's node formations. Anterior wedging compression deformity of T12 (AO spine A1) with approximately 50% height loss anteriorly some persistent lucency suggesting acuity. No other acute osseous injury or compression deformity is seen in the thoracic spine. No suspicious osseous lesions. Paraspinal and other soft tissues: Small amount paravertebral soft tissue thickening/stranding adjacent the T12 compression deformity. Finding favors acuity. For findings within the posterior chest and mediastinum, see dedicated CT from which this study is reconstructed. Disc levels: There is diffuse multilevel intervertebral disc height loss with discogenic endplate changes and some posterior disc osteophyte formation at T4-T5, T5-T6 and T7-T8 but without significant resulting canal stenosis or neural foraminal narrowing in the imaged thoracic spine. LUMBAR SPINE: Segmentation: 5 non-rib-bearing lumbar type vertebral levels. Alignment: There is slight levocurvature of the lumbar spine with an apex at L3. No abnormally widened, jumped or perched facets. Vertebrae: There is a split/pincer type compression deformity of L3 (AO spine A2). Central height loss of approximately 50%. No abnormal posterior wall involvement or retropulsion of fracture fragments. No additional acute fracture is seen. Posterior elements are intact. No suspicious osseous lesions. Multilevel Schmorl's node formations seen in the lumbar spine. Paraspinal and other soft tissues: Small amount of paravertebral soft tissue thickening at the L3 level suggests acuity. No visible canal hematoma. For findings in the posterior abdomen and pelvis, see dedicated CT from which this study is reconstructed. Disc levels: Multilevel intervertebral disc height loss  throughout the lumbar spine with multilevel global disc bulges. Central disc protrusions at L2 and L3 result in mild canal stenosis. A left central disc protrusion at L1 results in more moderate canal narrowing. Facet hypertrophic changes are maximal at L5-S1 with resulting mild to moderate multilevel neural foraminal narrowing the lumbar spine. IMPRESSION: 1. Anterior wedging compression deformity T12 with 50% height loss anteriorly (AOSpineA1). 2. Split/Pincer type compression deformity of L3 with approximately 50% height loss anteriorly (AOSpine A2). 3. Faint paraspinal soft tissue thickening adjacent both these deformities suggest acuity. 4. No other acute osseous injuries in the thoracic or lumbar spine. 5. Multilevel degenerative changes of the thoracic and lumbar spine, as described above. 6. For findings in the posterior chest, abdomen and pelvis, see dedicated CT from which this study is reconstructed. Electronically Signed   By: Lovena Le M.D.   On: 02/03/2019 20:56   CT L-SPINE NO CHARGE  Result Date: 02/03/2019 CLINICAL DATA:  Increasing weakness and multiple falls for 3 days EXAM: CT THORACIC AND LUMBAR SPINE WITHOUT CONTRAST TECHNIQUE: Multiplanar CT images of the thoracic and lumbar spine were reconstructed from contemporary CT of the Chest, abdomen and pelvis. CONTRAST:  None or No additional COMPARISON:  Chest radiograph 02/03/2019 CT  abdomen pelvis 05/04/2015, chest radiograph 05/02/2017, lumbar radiographs 10/13/2018 FINDINGS: THORACIC SPINE: Alignment: There is straightening of the normal thoracic kyphosis throughout the lower thoracic levels with focal kyphotic curvature at the level of the T12 compression deformity. Mild dextrocurvature throughout the thoracic spine with an apex at the T11 level. No traumatic listhesis. No abnormal widened, jumped or perched facets. Vertebrae: There is multilevel Schmorl's node formations. Anterior wedging compression deformity of T12 (AO spine A1) with  approximately 50% height loss anteriorly some persistent lucency suggesting acuity. No other acute osseous injury or compression deformity is seen in the thoracic spine. No suspicious osseous lesions. Paraspinal and other soft tissues: Small amount paravertebral soft tissue thickening/stranding adjacent the T12 compression deformity. Finding favors acuity. For findings within the posterior chest and mediastinum, see dedicated CT from which this study is reconstructed. Disc levels: There is diffuse multilevel intervertebral disc height loss with discogenic endplate changes and some posterior disc osteophyte formation at T4-T5, T5-T6 and T7-T8 but without significant resulting canal stenosis or neural foraminal narrowing in the imaged thoracic spine. LUMBAR SPINE: Segmentation: 5 non-rib-bearing lumbar type vertebral levels. Alignment: There is slight levocurvature of the lumbar spine with an apex at L3. No abnormally widened, jumped or perched facets. Vertebrae: There is a split/pincer type compression deformity of L3 (AO spine A2). Central height loss of approximately 50%. No abnormal posterior wall involvement or retropulsion of fracture fragments. No additional acute fracture is seen. Posterior elements are intact. No suspicious osseous lesions. Multilevel Schmorl's node formations seen in the lumbar spine. Paraspinal and other soft tissues: Small amount of paravertebral soft tissue thickening at the L3 level suggests acuity. No visible canal hematoma. For findings in the posterior abdomen and pelvis, see dedicated CT from which this study is reconstructed. Disc levels: Multilevel intervertebral disc height loss throughout the lumbar spine with multilevel global disc bulges. Central disc protrusions at L2 and L3 result in mild canal stenosis. A left central disc protrusion at L1 results in more moderate canal narrowing. Facet hypertrophic changes are maximal at L5-S1 with resulting mild to moderate multilevel  neural foraminal narrowing the lumbar spine. IMPRESSION: 1. Anterior wedging compression deformity T12 with 50% height loss anteriorly (AOSpineA1). 2. Split/Pincer type compression deformity of L3 with approximately 50% height loss anteriorly (AOSpine A2). 3. Faint paraspinal soft tissue thickening adjacent both these deformities suggest acuity. 4. No other acute osseous injuries in the thoracic or lumbar spine. 5. Multilevel degenerative changes of the thoracic and lumbar spine, as described above. 6. For findings in the posterior chest, abdomen and pelvis, see dedicated CT from which this study is reconstructed. Electronically Signed   By: Lovena Le M.D.   On: 02/03/2019 20:56   DG Shoulder Left  Result Date: 02/03/2019 CLINICAL DATA:  Shoulder pain EXAM: LEFT SHOULDER - 2+ VIEW COMPARISON:  None FINDINGS: There is no evidence of fracture or dislocation. There is no evidence of arthropathy or other focal bone abnormality. Soft tissues are unremarkable. IMPRESSION: Negative evaluation of the left shoulder. Electronically Signed   By: Zetta Bills M.D.   On: 02/03/2019 19:43   DG Hand Complete Left  Result Date: 02/03/2019 CLINICAL DATA:  Hand pain EXAM: LEFT HAND - COMPLETE 3+ VIEW COMPARISON:  None FINDINGS: Degenerative changes at the first carpometacarpal joint. No signs of fracture or dislocation. Soft tissues are unremarkable. IMPRESSION: Degenerative changes without fracture. Electronically Signed   By: Zetta Bills M.D.   On: 02/03/2019 19:42   Impression/Plan   63 y.o.  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 She is neurologically intact with exception of gait disturbance & abnormal proprioception.  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 CT chest/abd/pelvis shows lung,  breast and adrenal masses - Given size, will need to have this tumor resected for both diagnosis and therapeutic purposes. Plan for tomorrow afternoon. - Start decadron 10mg  now, then 4 mg q 6 hours - Keppra for seizure prophylaxis - MRI brain w/w/o with stereotactic planning protocol - NPO at midnight with exception of medication - frequent neuro checks  Lung/Breast/Adrenal masses - As above - Will consult Oncology  T12 wedge fracture with 50% height loss - No retropulsion/spinal canal compromise - Tx conservatively with TLSO bracing which is to be worn when upright and OOB  L3 compression fracture - No retropulsion/spinal canal compromise - Tx conservatively with TLSO as above  I have consulted TH, Dr Hal Hope, who has agreed to see the patient for assistance with medical management. We appreciate his assistance.  - possible cystitis, Hypokalemia, hypothyroidism with TSH 75   I have discussed the plan for surgery tomorrow with both the patient and daughter. Dr Kathyrn Sheriff will follow up tomorrow to further discuss details of surgery. All questions answered. They state understanding of plan going forward. Admit to neuro ICU. We have made an exception tonight for her daughter to stay with her given new diagnosis.  Ferne Reus, PA-C [facility] [employer]

## 2019-02-04 ENCOUNTER — Inpatient Hospital Stay (HOSPITAL_COMMUNITY): Payer: Medicare Other | Admitting: Certified Registered Nurse Anesthetist

## 2019-02-04 ENCOUNTER — Encounter (HOSPITAL_COMMUNITY): Payer: Self-pay | Admitting: *Deleted

## 2019-02-04 ENCOUNTER — Inpatient Hospital Stay (HOSPITAL_COMMUNITY): Payer: Medicare Other

## 2019-02-04 ENCOUNTER — Encounter (HOSPITAL_COMMUNITY)
Admission: EM | Disposition: A | Discharge status: Rehabilitation Facility | Payer: Self-pay | Source: Home / Self Care | Attending: Neurosurgery

## 2019-02-04 HISTORY — PX: CRANIOTOMY: SHX93

## 2019-02-04 LAB — CBC
HCT: 28.6 % — ABNORMAL LOW (ref 36.0–46.0)
Hemoglobin: 9.2 g/dL — ABNORMAL LOW (ref 12.0–15.0)
MCH: 30.7 pg (ref 26.0–34.0)
MCHC: 32.2 g/dL (ref 30.0–36.0)
MCV: 95.3 fL (ref 80.0–100.0)
Platelets: 309 10*3/uL (ref 150–400)
RBC: 3 MIL/uL — ABNORMAL LOW (ref 3.87–5.11)
RDW: 12.9 % (ref 11.5–15.5)
WBC: 10.5 10*3/uL (ref 4.0–10.5)
nRBC: 0 % (ref 0.0–0.2)

## 2019-02-04 LAB — BASIC METABOLIC PANEL
Anion gap: 10 (ref 5–15)
BUN: 16 mg/dL (ref 8–23)
CO2: 25 mmol/L (ref 22–32)
Calcium: 9.6 mg/dL (ref 8.9–10.3)
Chloride: 100 mmol/L (ref 98–111)
Creatinine, Ser: 0.92 mg/dL (ref 0.44–1.00)
GFR calc Af Amer: >60 mL/min (ref >60)
GFR calc non Af Amer: >60 mL/min (ref >60)
Glucose, Bld: 130 mg/dL — ABNORMAL HIGH (ref 70–99)
Potassium: 3.4 mmol/L — ABNORMAL LOW (ref 3.5–5.1)
Sodium: 135 mmol/L (ref 135–145)

## 2019-02-04 LAB — POCT I-STAT 7, (LYTES, BLD GAS, ICA,H+H)
Acid-base deficit: 1 mmol/L (ref 0.0–2.0)
Bicarbonate: 23.6 mmol/L (ref 20.0–28.0)
Bicarbonate: 24.7 mmol/L (ref 20.0–28.0)
Calcium, Ion: 1.29 mmol/L (ref 1.15–1.40)
Calcium, Ion: 1.36 mmol/L (ref 1.15–1.40)
HCT: 23 % — ABNORMAL LOW (ref 36.0–46.0)
HCT: 25 % — ABNORMAL LOW (ref 36.0–46.0)
Hemoglobin: 7.8 g/dL — ABNORMAL LOW (ref 12.0–15.0)
Hemoglobin: 8.5 g/dL — ABNORMAL LOW (ref 12.0–15.0)
O2 Saturation: 100 %
O2 Saturation: 100 %
Patient temperature: 35.6
Patient temperature: 35.6
Potassium: 3 mmol/L — ABNORMAL LOW (ref 3.5–5.1)
Potassium: 3.3 mmol/L — ABNORMAL LOW (ref 3.5–5.1)
Sodium: 138 mmol/L (ref 135–145)
Sodium: 139 mmol/L (ref 135–145)
TCO2: 25 mmol/L (ref 22–32)
TCO2: 26 mmol/L (ref 22–32)
pCO2 arterial: 35.6 mmHg (ref 32.0–48.0)
pCO2 arterial: 37.1 mmHg (ref 32.0–48.0)
pH, Arterial: 7.423 (ref 7.350–7.450)
pH, Arterial: 7.426 (ref 7.350–7.450)
pO2, Arterial: 276 mmHg — ABNORMAL HIGH (ref 83.0–108.0)
pO2, Arterial: 289 mmHg — ABNORMAL HIGH (ref 83.0–108.0)

## 2019-02-04 LAB — PREPARE RBC (CROSSMATCH)

## 2019-02-04 LAB — MAGNESIUM: Magnesium: 1.8 mg/dL (ref 1.7–2.4)

## 2019-02-04 LAB — ABO/RH: ABO/RH(D): A POS

## 2019-02-04 SURGERY — CRANIOTOMY TUMOR EXCISION
Anesthesia: General

## 2019-02-04 MED ORDER — SODIUM CHLORIDE 0.9 % IV SOLN
0.0125 ug/kg/min | INTRAVENOUS | Status: DC
  Filled 2019-02-04: qty 2000

## 2019-02-04 MED ORDER — POLYETHYLENE GLYCOL 3350 17 G PO PACK: 17.0000 g | PACK | Freq: Every day | ORAL | Status: DC | PRN

## 2019-02-04 MED ORDER — FENTANYL CITRATE (PF) 250 MCG/5ML IJ SOLN
INTRAMUSCULAR | Status: AC
  Filled 2019-02-04: qty 5

## 2019-02-04 MED ORDER — FENTANYL CITRATE (PF) 100 MCG/2ML IJ SOLN
25.0000 ug | INTRAMUSCULAR | Status: DC | PRN
  Administered 2019-02-04 (×3): 50 ug via INTRAVENOUS

## 2019-02-04 MED ORDER — LIDOCAINE 2% (20 MG/ML) 5 ML SYRINGE
INTRAMUSCULAR | Status: AC
  Filled 2019-02-04: qty 10

## 2019-02-04 MED ORDER — ONDANSETRON HCL 4 MG/2ML IJ SOLN: 4.0000 mg | INTRAMUSCULAR | Status: DC | PRN

## 2019-02-04 MED ORDER — ROCURONIUM BROMIDE 10 MG/ML (PF) SYRINGE
PREFILLED_SYRINGE | INTRAVENOUS | Status: AC
  Filled 2019-02-04: qty 20

## 2019-02-04 MED ORDER — ACETAMINOPHEN 325 MG PO TABS
650.0000 mg | ORAL_TABLET | ORAL | Status: DC | PRN
  Administered 2019-02-05 – 2019-02-12 (×6): 650 mg via ORAL
  Filled 2019-02-04 (×6): qty 2

## 2019-02-04 MED ORDER — GLYCOPYRROLATE 0.2 MG/ML IJ SOLN
INTRAMUSCULAR | Status: DC | PRN
  Administered 2019-02-04 (×2): .1 mg via INTRAVENOUS

## 2019-02-04 MED ORDER — DEXAMETHASONE SODIUM PHOSPHATE 10 MG/ML IJ SOLN
INTRAMUSCULAR | Status: DC | PRN
  Administered 2019-02-04: 4 mg via INTRAVENOUS

## 2019-02-04 MED ORDER — CHLORHEXIDINE GLUCONATE CLOTH 2 % EX PADS
6.0000 | MEDICATED_PAD | Freq: Every day | CUTANEOUS | Status: DC
  Administered 2019-02-06: 10:00:00 6 via TOPICAL

## 2019-02-04 MED ORDER — MORPHINE SULFATE (PF) 2 MG/ML IV SOLN
1.0000 mg | Freq: Once | INTRAVENOUS | Status: AC
  Administered 2019-02-04: 05:00:00 1 mg via INTRAVENOUS
  Filled 2019-02-04: qty 1

## 2019-02-04 MED ORDER — SODIUM CHLORIDE 0.9 % IV SOLN
INTRAVENOUS | Status: DC | PRN
  Administered 2019-02-04: .2 ug/kg/min via INTRAVENOUS

## 2019-02-04 MED ORDER — SODIUM CHLORIDE 0.9% IV SOLUTION: Freq: Once | INTRAVENOUS | Status: AC

## 2019-02-04 MED ORDER — VANCOMYCIN HCL 750 MG/150ML IV SOLN
750.0000 mg | Freq: Two times a day (BID) | INTRAVENOUS | Status: AC
  Administered 2019-02-04 – 2019-02-05 (×2): 750 mg via INTRAVENOUS
  Filled 2019-02-04 (×2): qty 150

## 2019-02-04 MED ORDER — VANCOMYCIN HCL 1000 MG IV SOLR
INTRAVENOUS | Status: AC
  Filled 2019-02-04: qty 1000

## 2019-02-04 MED ORDER — ACETAMINOPHEN 650 MG RE SUPP: 650.0000 mg | RECTAL | Status: DC | PRN

## 2019-02-04 MED ORDER — BUPIVACAINE HCL (PF) 0.5 % IJ SOLN
INTRAMUSCULAR | Status: AC
  Filled 2019-02-04: qty 30

## 2019-02-04 MED ORDER — LIDOCAINE 2% (20 MG/ML) 5 ML SYRINGE
INTRAMUSCULAR | Status: DC | PRN
  Administered 2019-02-04: 60 mg via INTRAVENOUS

## 2019-02-04 MED ORDER — FLEET ENEMA 7-19 GM/118ML RE ENEM: 1.0000 | ENEMA | Freq: Once | RECTAL | Status: DC | PRN

## 2019-02-04 MED ORDER — THROMBIN 20000 UNITS EX SOLR
CUTANEOUS | Status: AC
  Filled 2019-02-04: qty 20000

## 2019-02-04 MED ORDER — ARTIFICIAL TEARS OPHTHALMIC OINT
TOPICAL_OINTMENT | OPHTHALMIC | Status: DC | PRN
  Administered 2019-02-04: 1 via OPHTHALMIC

## 2019-02-04 MED ORDER — THROMBIN 5000 UNITS EX SOLR: OROMUCOSAL | Status: DC | PRN

## 2019-02-04 MED ORDER — NALOXONE HCL 0.4 MG/ML IJ SOLN: 0.0800 mg | INTRAMUSCULAR | Status: DC | PRN

## 2019-02-04 MED ORDER — HEMOSTATIC AGENTS (NO CHARGE) OPTIME
TOPICAL | Status: DC | PRN
  Administered 2019-02-04: 1 via TOPICAL

## 2019-02-04 MED ORDER — ONDANSETRON HCL 4 MG PO TABS: 4.0000 mg | ORAL_TABLET | ORAL | Status: DC | PRN

## 2019-02-04 MED ORDER — DEXAMETHASONE SODIUM PHOSPHATE 10 MG/ML IJ SOLN
INTRAMUSCULAR | Status: AC
  Filled 2019-02-04: qty 3

## 2019-02-04 MED ORDER — PANTOPRAZOLE SODIUM 40 MG IV SOLR
40.0000 mg | Freq: Every day | INTRAVENOUS | Status: DC
  Administered 2019-02-04 – 2019-02-05 (×2): 40 mg via INTRAVENOUS
  Filled 2019-02-04 (×2): qty 40

## 2019-02-04 MED ORDER — PHENYLEPHRINE HCL-NACL 10-0.9 MG/250ML-% IV SOLN
INTRAVENOUS | Status: DC | PRN
  Administered 2019-02-04: 15 ug/min via INTRAVENOUS

## 2019-02-04 MED ORDER — PHENYLEPHRINE HCL-NACL 10-0.9 MG/250ML-% IV SOLN: INTRAVENOUS | Status: DC | PRN

## 2019-02-04 MED ORDER — PROPOFOL 10 MG/ML IV BOLUS
INTRAVENOUS | Status: DC | PRN
  Administered 2019-02-04: 30 mg via INTRAVENOUS
  Administered 2019-02-04: 100 mg via INTRAVENOUS
  Administered 2019-02-04: 50 mg via INTRAVENOUS

## 2019-02-04 MED ORDER — HYDROCODONE-ACETAMINOPHEN 5-325 MG PO TABS
1.0000 | ORAL_TABLET | ORAL | Status: DC | PRN
  Administered 2019-02-05 – 2019-02-16 (×20): 1 via ORAL
  Filled 2019-02-04 (×20): qty 1

## 2019-02-04 MED ORDER — SODIUM CHLORIDE 0.9 % IV SOLN: INTRAVENOUS | Status: DC

## 2019-02-04 MED ORDER — ONDANSETRON HCL 4 MG/2ML IJ SOLN
INTRAMUSCULAR | Status: AC
  Filled 2019-02-04: qty 2

## 2019-02-04 MED ORDER — THROMBIN 20000 UNITS EX SOLR
CUTANEOUS | Status: DC | PRN
  Administered 2019-02-04: 20 mL via TOPICAL

## 2019-02-04 MED ORDER — SUGAMMADEX SODIUM 200 MG/2ML IV SOLN
INTRAVENOUS | Status: DC | PRN
  Administered 2019-02-04: 200 mg via INTRAVENOUS

## 2019-02-04 MED ORDER — ONDANSETRON HCL 4 MG/2ML IJ SOLN: 4.0000 mg | Freq: Once | INTRAMUSCULAR | Status: DC | PRN

## 2019-02-04 MED ORDER — THROMBIN 5000 UNITS EX SOLR
CUTANEOUS | Status: AC
  Filled 2019-02-04: qty 5000

## 2019-02-04 MED ORDER — PROPOFOL 500 MG/50ML IV EMUL
INTRAVENOUS | Status: DC | PRN
  Administered 2019-02-04: 50 ug/kg/min via INTRAVENOUS

## 2019-02-04 MED ORDER — VANCOMYCIN HCL IN DEXTROSE 1-5 GM/200ML-% IV SOLN: 1000.0000 mg | INTRAVENOUS | Status: DC

## 2019-02-04 MED ORDER — GADOBUTROL 1 MMOL/ML IV SOLN
7.0000 mL | Freq: Once | INTRAVENOUS | Status: AC | PRN
  Administered 2019-02-04: 7 mL via INTRAVENOUS

## 2019-02-04 MED ORDER — FENTANYL CITRATE (PF) 100 MCG/2ML IJ SOLN
INTRAMUSCULAR | Status: AC
  Filled 2019-02-04: qty 2

## 2019-02-04 MED ORDER — SODIUM CHLORIDE 0.9 % IV SOLN: INTRAVENOUS | Status: DC | PRN

## 2019-02-04 MED ORDER — FENTANYL CITRATE (PF) 100 MCG/2ML IJ SOLN
INTRAMUSCULAR | Status: DC | PRN
  Administered 2019-02-04 (×2): 50 ug via INTRAVENOUS
  Administered 2019-02-04: 100 ug via INTRAVENOUS

## 2019-02-04 MED ORDER — LABETALOL HCL 5 MG/ML IV SOLN: 10.0000 mg | INTRAVENOUS | Status: DC | PRN

## 2019-02-04 MED ORDER — SENNA 8.6 MG PO TABS
1.0000 | ORAL_TABLET | Freq: Two times a day (BID) | ORAL | Status: DC
  Administered 2019-02-05 – 2019-02-16 (×21): 8.6 mg via ORAL
  Filled 2019-02-04 (×23): qty 1

## 2019-02-04 MED ORDER — POTASSIUM CHLORIDE 10 MEQ/100ML IV SOLN
10.0000 meq | INTRAVENOUS | Status: AC
  Administered 2019-02-04 (×2): 10 meq via INTRAVENOUS
  Filled 2019-02-04 (×2): qty 100

## 2019-02-04 MED ORDER — GLYCOPYRROLATE PF 0.2 MG/ML IJ SOSY
PREFILLED_SYRINGE | INTRAMUSCULAR | Status: AC
  Filled 2019-02-04: qty 1

## 2019-02-04 MED ORDER — LIDOCAINE-EPINEPHRINE 1 %-1:100000 IJ SOLN
INTRAMUSCULAR | Status: DC | PRN
  Administered 2019-02-04: 5 mL

## 2019-02-04 MED ORDER — VANCOMYCIN HCL IN DEXTROSE 1-5 GM/200ML-% IV SOLN
INTRAVENOUS | Status: AC
  Filled 2019-02-04: qty 200

## 2019-02-04 MED ORDER — BISACODYL 5 MG PO TBEC: 5.0000 mg | DELAYED_RELEASE_TABLET | Freq: Every day | ORAL | Status: DC | PRN

## 2019-02-04 MED ORDER — LIDOCAINE-EPINEPHRINE 1 %-1:100000 IJ SOLN
INTRAMUSCULAR | Status: AC
  Filled 2019-02-04: qty 1

## 2019-02-04 MED ORDER — VANCOMYCIN HCL 1000 MG IV SOLR
INTRAVENOUS | Status: DC | PRN
  Administered 2019-02-04: 1000 mg via INTRAVENOUS

## 2019-02-04 MED ORDER — PROPOFOL 10 MG/ML IV BOLUS
INTRAVENOUS | Status: AC
  Filled 2019-02-04: qty 20

## 2019-02-04 MED ORDER — PROMETHAZINE HCL 25 MG PO TABS: 12.5000 mg | ORAL_TABLET | ORAL | Status: DC | PRN

## 2019-02-04 MED ORDER — BUPIVACAINE HCL (PF) 0.5 % IJ SOLN
INTRAMUSCULAR | Status: DC | PRN
  Administered 2019-02-04: 5 mL

## 2019-02-04 MED ORDER — BACITRACIN ZINC 500 UNIT/GM EX OINT
TOPICAL_OINTMENT | CUTANEOUS | Status: AC
  Filled 2019-02-04: qty 28.35

## 2019-02-04 MED ORDER — PHENYLEPHRINE HCL (PRESSORS) 10 MG/ML IV SOLN
INTRAVENOUS | Status: DC | PRN
  Administered 2019-02-04: 20 ug via INTRAVENOUS

## 2019-02-04 MED ORDER — SODIUM CHLORIDE 0.9 % IV SOLN
INTRAVENOUS | Status: DC | PRN
  Administered 2019-02-04: 500 mL

## 2019-02-04 MED ORDER — HYDROMORPHONE HCL 1 MG/ML IJ SOLN
0.5000 mg | INTRAMUSCULAR | Status: DC | PRN
  Administered 2019-02-04: 0.5 mg via INTRAVENOUS
  Administered 2019-02-05: 1 mg via INTRAVENOUS
  Administered 2019-02-06: 0.5 mg via INTRAVENOUS
  Administered 2019-02-08 – 2019-02-10 (×5): 1 mg via INTRAVENOUS
  Filled 2019-02-04 (×8): qty 1

## 2019-02-04 MED ORDER — 0.9 % SODIUM CHLORIDE (POUR BTL) OPTIME
TOPICAL | Status: DC | PRN
  Administered 2019-02-04 (×3): 1000 mL

## 2019-02-04 MED ORDER — HYDRALAZINE HCL 20 MG/ML IJ SOLN: 10.0000 mg | INTRAMUSCULAR | Status: DC | PRN

## 2019-02-04 MED ORDER — BACITRACIN ZINC 500 UNIT/GM EX OINT
TOPICAL_OINTMENT | CUTANEOUS | Status: DC | PRN
  Administered 2019-02-04 (×2): 1 via TOPICAL

## 2019-02-04 MED ORDER — ONDANSETRON HCL 4 MG/2ML IJ SOLN
INTRAMUSCULAR | Status: DC | PRN
  Administered 2019-02-04: 4 mg via INTRAVENOUS

## 2019-02-04 MED ORDER — ROCURONIUM BROMIDE 50 MG/5ML IV SOSY
PREFILLED_SYRINGE | INTRAVENOUS | Status: DC | PRN
  Administered 2019-02-04: 20 mg via INTRAVENOUS
  Administered 2019-02-04: 40 mg via INTRAVENOUS
  Administered 2019-02-04: 10 mg via INTRAVENOUS

## 2019-02-04 MED ORDER — PHENYLEPHRINE 40 MCG/ML (10ML) SYRINGE FOR IV PUSH (FOR BLOOD PRESSURE SUPPORT)
PREFILLED_SYRINGE | INTRAVENOUS | Status: AC
  Filled 2019-02-04: qty 10

## 2019-02-04 SURGICAL SUPPLY — 103 items
BAND RUBBER #18 3X1/16 STRL (MISCELLANEOUS) ×6 IMPLANT
BATTERY IQ STERILE (MISCELLANEOUS) ×3 IMPLANT
BENZOIN TINCTURE PRP APPL 2/3 (GAUZE/BANDAGES/DRESSINGS) IMPLANT
BLADE CLIPPER SURG (BLADE) ×3 IMPLANT
BLADE SAW GIGLI 16 STRL (MISCELLANEOUS) IMPLANT
BLADE SURG 15 STRL LF DISP TIS (BLADE) IMPLANT
BLADE SURG 15 STRL SS (BLADE)
BLADE ULTRA TIP 2M (BLADE) ×3 IMPLANT
BNDG GAUZE ELAST 4 BULKY (GAUZE/BANDAGES/DRESSINGS) IMPLANT
BNDG STRETCH 4X75 STRL LF (GAUZE/BANDAGES/DRESSINGS) IMPLANT
BUR ACORN 6.0 PRECISION (BURR) ×2 IMPLANT
BUR ACORN 6.0MM PRECISION (BURR) ×1
BUR ROUND FLUTED 4 SOFT TCH (BURR) IMPLANT
BUR ROUND FLUTED 4MM SOFT TCH (BURR)
BUR SPIRAL ROUTER 2.3 (BUR) ×2 IMPLANT
BUR SPIRAL ROUTER 2.3MM (BUR) ×1
CANISTER SUCT 3000ML PPV (MISCELLANEOUS) ×6 IMPLANT
CARTRIDGE OIL MAESTRO DRILL (MISCELLANEOUS) ×1 IMPLANT
CATH VENTRIC 35X38 W/TROCAR LG (CATHETERS) IMPLANT
CLIP VESOCCLUDE MED 6/CT (CLIP) IMPLANT
CONT SPEC 4OZ CLIKSEAL STRL BL (MISCELLANEOUS) ×3 IMPLANT
COVER MAYO STAND STRL (DRAPES) IMPLANT
COVER WAND RF STERILE (DRAPES) ×3 IMPLANT
DECANTER SPIKE VIAL GLASS SM (MISCELLANEOUS) ×3 IMPLANT
DIFFUSER DRILL AIR PNEUMATIC (MISCELLANEOUS) ×3 IMPLANT
DRAIN SUBARACHNOID (WOUND CARE) IMPLANT
DRAPE HALF SHEET 40X57 (DRAPES) ×3 IMPLANT
DRAPE MICROSCOPE LEICA (MISCELLANEOUS) ×3 IMPLANT
DRAPE NEUROLOGICAL W/INCISE (DRAPES) ×3 IMPLANT
DRAPE STERI IOBAN 125X83 (DRAPES) IMPLANT
DRAPE SURG 17X23 STRL (DRAPES) IMPLANT
DRAPE WARM FLUID 44X44 (DRAPES) ×3 IMPLANT
DRSG ADAPTIC 3X8 NADH LF (GAUZE/BANDAGES/DRESSINGS) IMPLANT
DRSG OPSITE POSTOP 4X8 (GAUZE/BANDAGES/DRESSINGS) ×3 IMPLANT
DRSG TELFA 3X8 NADH (GAUZE/BANDAGES/DRESSINGS) ×3 IMPLANT
DURAMATRIX ONLAY 3X3 (Plate) ×6 IMPLANT
DURAPREP 6ML APPLICATOR 50/CS (WOUND CARE) ×3 IMPLANT
ELECT REM PT RETURN 9FT ADLT (ELECTROSURGICAL) ×3
ELECTRODE REM PT RTRN 9FT ADLT (ELECTROSURGICAL) ×1 IMPLANT
EVACUATOR 1/8 PVC DRAIN (DRAIN) IMPLANT
EVACUATOR SILICONE 100CC (DRAIN) IMPLANT
FORCEPS BIPOLAR SPETZLER 8 1.0 (NEUROSURGERY SUPPLIES) ×3 IMPLANT
GAUZE 4X4 16PLY RFD (DISPOSABLE) IMPLANT
GAUZE SPONGE 4X4 12PLY STRL (GAUZE/BANDAGES/DRESSINGS) ×3 IMPLANT
GLOVE BIO SURGEON STRL SZ7.5 (GLOVE) ×3 IMPLANT
GLOVE BIOGEL PI IND STRL 7.5 (GLOVE) ×2 IMPLANT
GLOVE BIOGEL PI INDICATOR 7.5 (GLOVE) ×4
GLOVE ECLIPSE 7.0 STRL STRAW (GLOVE) ×6 IMPLANT
GLOVE EXAM NITRILE XL STR (GLOVE) IMPLANT
GOWN STRL REUS W/ TWL LRG LVL3 (GOWN DISPOSABLE) ×2 IMPLANT
GOWN STRL REUS W/ TWL XL LVL3 (GOWN DISPOSABLE) IMPLANT
GOWN STRL REUS W/TWL 2XL LVL3 (GOWN DISPOSABLE) IMPLANT
GOWN STRL REUS W/TWL LRG LVL3 (GOWN DISPOSABLE) ×4
GOWN STRL REUS W/TWL XL LVL3 (GOWN DISPOSABLE)
HEMOSTAT POWDER KIT SURGIFOAM (HEMOSTASIS) ×6 IMPLANT
HEMOSTAT SURGICEL 2X14 (HEMOSTASIS) IMPLANT
HOOK DURA 1/2IN (MISCELLANEOUS) ×3 IMPLANT
IV NS 1000ML (IV SOLUTION) ×2
IV NS 1000ML BAXH (IV SOLUTION) ×1 IMPLANT
KIT BASIN OR (CUSTOM PROCEDURE TRAY) ×3 IMPLANT
KIT DRAIN CSF ACCUDRAIN (MISCELLANEOUS) IMPLANT
KIT TURNOVER KIT B (KITS) ×3 IMPLANT
KNIFE ARACHNOID DISP AM-24-S (MISCELLANEOUS) IMPLANT
NEEDLE HYPO 22GX1.5 SAFETY (NEEDLE) ×3 IMPLANT
NEEDLE SPNL 18GX3.5 QUINCKE PK (NEEDLE) IMPLANT
NS IRRIG 1000ML POUR BTL (IV SOLUTION) ×9 IMPLANT
OIL CARTRIDGE MAESTRO DRILL (MISCELLANEOUS) ×3
PACK CRANIOTOMY CUSTOM (CUSTOM PROCEDURE TRAY) ×3 IMPLANT
PATTIES SURGICAL .25X.25 (GAUZE/BANDAGES/DRESSINGS) IMPLANT
PATTIES SURGICAL .5 X.5 (GAUZE/BANDAGES/DRESSINGS) ×3 IMPLANT
PATTIES SURGICAL .5 X3 (DISPOSABLE) IMPLANT
PATTIES SURGICAL 1/4 X 3 (GAUZE/BANDAGES/DRESSINGS) IMPLANT
PATTIES SURGICAL 1X1 (DISPOSABLE) IMPLANT
PIN MAYFIELD SKULL DISP (PIN) ×3 IMPLANT
PLATE 1.5/0.5 35MM NEURO GAP (Plate) ×3 IMPLANT
PLATE 1.5/0.5 4HOLE NEURO REC (Plate) ×3 IMPLANT
SCREW SELF DRILL HT 1.5/4MM (Screw) ×24 IMPLANT
SEALANT ADHERUS EXTEND TIP (MISCELLANEOUS) ×3 IMPLANT
SET CARTRIDGE AND TUBING (SET/KITS/TRAYS/PACK) ×3 IMPLANT
SPECIMEN JAR SMALL (MISCELLANEOUS) ×3 IMPLANT
SPONGE NEURO XRAY DETECT 1X3 (DISPOSABLE) IMPLANT
SPONGE SURGIFOAM ABS GEL 100 (HEMOSTASIS) ×3 IMPLANT
STAPLER VISISTAT 35W (STAPLE) ×3 IMPLANT
STOCKINETTE 6  STRL (DRAPES)
STOCKINETTE 6 STRL (DRAPES) IMPLANT
SUT ETHILON 3 0 FSL (SUTURE) IMPLANT
SUT ETHILON 3 0 PS 1 (SUTURE) IMPLANT
SUT NURALON 4 0 TR CR/8 (SUTURE) ×6 IMPLANT
SUT SILK 0 TIES 10X30 (SUTURE) IMPLANT
SUT VIC AB 0 CT1 18XCR BRD8 (SUTURE) ×2 IMPLANT
SUT VIC AB 0 CT1 8-18 (SUTURE) ×4
SUT VIC AB 3-0 SH 8-18 (SUTURE) ×6 IMPLANT
TAPE CLOTH 1X10 TAN NS (GAUZE/BANDAGES/DRESSINGS) ×3 IMPLANT
TIP STANDARD 36KHZ (INSTRUMENTS) ×3
TIP STD 36KHZ (INSTRUMENTS) ×1 IMPLANT
TOWEL GREEN STERILE (TOWEL DISPOSABLE) ×3 IMPLANT
TOWEL GREEN STERILE FF (TOWEL DISPOSABLE) ×3 IMPLANT
TRAY FOLEY MTR SLVR 16FR STAT (SET/KITS/TRAYS/PACK) ×3 IMPLANT
TUBE CONNECTING 12'X1/4 (SUCTIONS) ×1
TUBE CONNECTING 12X1/4 (SUCTIONS) ×2 IMPLANT
UNDERPAD 30X30 (UNDERPADS AND DIAPERS) ×3 IMPLANT
WATER STERILE IRR 1000ML POUR (IV SOLUTION) ×3 IMPLANT
WRENCH TORQUE 36KHZ (INSTRUMENTS) ×3 IMPLANT

## 2019-02-04 NOTE — Anesthesia Procedure Notes (Signed)
Procedure Name: Intubation Date/Time: 02/04/2019 12:06 PM Performed by: Inda Coke, CRNA Pre-anesthesia Checklist: Patient identified, Emergency Drugs available, Suction available and Patient being monitored Patient Re-evaluated:Patient Re-evaluated prior to induction Oxygen Delivery Method: Circle system utilized Preoxygenation: Pre-oxygenation with 100% oxygen Induction Type: IV induction Ventilation: Mask ventilation without difficulty Laryngoscope Size: Mac and 3 Grade View: Grade I Tube type: Oral Tube size: 7.0 mm Number of attempts: 1 Airway Equipment and Method: Stylet and Video-laryngoscopy Placement Confirmation: ETT inserted through vocal cords under direct vision,  positive ETCO2 and breath sounds checked- equal and bilateral Secured at: 21 cm Tube secured with: Tape Dental Injury: Teeth and Oropharynx as per pre-operative assessment  Comments: Performed by Suszanne Finch

## 2019-02-04 NOTE — Progress Notes (Signed)
PROGRESS NOTE    Krista Hodges  [id number] DOB: September 29, 1955 DOA: 02/03/2019 PCP: Josetta Huddle, MD   Brief Narrative: Krista Hodges is a 63 y.o. 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 Patient presented secondary to worsening confusion and found to have a cerebellar mass with vasogenic edema with concern for metastatic disease.   Assessment & Plan:   Active Problems:   Essential (primary) hypertension   Brain tumor ([place])   Hypokalemia   Hypothyroidism   Cerebellar mass with vasogenic edema Neurosurgery consulted and started decadron. Plan for surgery. CT scan of head/chest/abdomen/pelvis concerning for multiple areas of metastatic disease. -Neurosurgery recommendations  Essential hypertension Well controlled -Continue irbesartan  Hypokalemia Given supplementation with improvement -Check magnesium  Anemia Appears to be acute. Unsure if any source of bleeding at this time -Daily CBC  Hyperlipidemia -Continue Crestor  Hypothyroidism TSH of 75.67. On Synthroid 50 mcg as an outpatient. Started on Synthroid 88 mcg inpatient -Will need to clarify if patient has been taking medication; for now, continue Synthroid  Depression -Continue Zoloft and Wellbutrin  Cognitive impairment -Continue Aricept and Namenda  Compression fractures T12 and L3 noted incidentally  History of papillary thyroid carcinoma/endometrial carcinoma/renal cell carcinoma  Follows at [facility]  Hyperglycemia Hemoglobin A1C pending   DVT prophylaxis: SCDs Code Status:   Code Status: Full Code Family Communication: None Disposition Plan: Discharge per primary    Procedures:   None  Antimicrobials:  None    Subjective: No issues reported overnight  Objective: Vitals:   02/04/19 1007  02/04/19 1152 02/04/19 1615 02/04/19 1630  BP:   140/81 137/76  Pulse:   74 77  Resp:   18 15  Temp:   97.8 F (36.6 C)   TempSrc:      SpO2:   100% 99%  Weight:  65 kg    Height: 5\' 6"  (1.676 m)       Intake/Output Summary (Last 24 hours) at 02/04/2019 1654 Last data filed at 02/04/2019 1557 Gross per 24 hour  Intake 3250 ml  Output 1500 ml  Net 1750 ml   Filed Weights   02/04/19 1152  Weight: 65 kg    Examination:  General exam: Appears calm and comfortable  Data Reviewed: I have personally reviewed following labs and imaging studies  CBC: Recent Labs  Lab 02/03/19 1827 02/04/19 1244 02/04/19 1251 02/04/19 1307  WBC 13.0*  --   --  10.5  NEUTROABS 11.9*  --   --   --   HGB 11.0* 7.8* 8.5* 9.2*  HCT 34.4* 23.0* 25.0* 28.6*  MCV 96.1  --   --  95.3  PLT 332  --   --  [id number]   Basic Metabolic Panel: Recent Labs  Lab 02/03/19 1827 02/04/19 0342 02/04/19 1244 02/04/19 1251  NA 135 135 139 138  K 2.7* 3.4* 3.0* 3.3*  CL 98 100  --   --   CO2 26 25  --   --   GLUCOSE 154* 130*  --   --   BUN 18 16  --   --   CREATININE 1.04* 0.92  --   --   CALCIUM 9.5 9.6  --   --    GFR: Estimated Creatinine Clearance: 57.1 mL/min (by C-G formula based on SCr of 0.92 mg/dL). Liver Function Tests: Recent Labs  Lab 02/03/19 1827  AST 16  ALT 12  ALKPHOS 103  BILITOT 0.5  PROT 6.3*  ALBUMIN 3.2*   No results for input(s): LIPASE, AMYLASE in the last 168 hours. No results for input(s): AMMONIA in the last 168 hours. Coagulation Profile: Recent Labs  Lab 02/03/19 2151  INR 1.0   Cardiac Enzymes: Recent Labs  Lab 02/03/19 1827  CKTOTAL 46   BNP (last 3 results) No results for input(s): PROBNP in the last 8760 hours. HbA1C: No results for input(s): HGBA1C in the last 72 hours. CBG: Recent Labs  Lab 02/03/19 1803  GLUCAP 165*   Lipid Profile: No results for input(s): CHOL, HDL, LDLCALC, TRIG, CHOLHDL, LDLDIRECT in the last 72 hours. Thyroid Function  Tests: Recent Labs    02/03/19 1854  TSH 75.666*  FREET4 0.45*   Anemia Panel: No results for input(s): VITAMINB12, FOLATE, FERRITIN, TIBC, IRON, RETICCTPCT in the last 72 hours. Sepsis Labs: No results for input(s): PROCALCITON, LATICACIDVEN in the last 168 hours.  Recent Results (from the past 240 hour(s))  Respiratory Panel by RT PCR (Flu A&B, Covid) - Nasopharyngeal Swab     Status: None   Collection Time: 02/03/19  9:12 PM   Specimen: Nasopharyngeal Swab  Result Value Ref Range Status   SARS Coronavirus 2 by RT PCR NEGATIVE NEGATIVE Final    Comment: (NOTE) SARS-CoV-2 target nucleic acids are NOT DETECTED. The SARS-CoV-2 RNA is generally detectable in upper respiratoy specimens during the acute phase of infection. The lowest concentration of SARS-CoV-2 viral copies this assay can detect is 131 copies/mL. A negative result does not preclude SARS-Cov-2 infection and should not be used as the sole basis for treatment or other patient management decisions. A negative result may occur with  improper specimen collection/handling, submission of specimen other than nasopharyngeal swab, presence of viral mutation(s) within the areas targeted by this assay, and inadequate number of viral copies (<131 copies/mL). A negative result must be combined with clinical observations, patient history, and epidemiological information. The expected result is Negative. Fact Sheet for Patients:  PinkCheek.be Fact Sheet for Healthcare Providers:  GravelBags.it This test is not yet ap proved or cleared by the [place] FDA and  has been authorized for detection and/or diagnosis of SARS-CoV-2 by FDA under an Emergency Use Authorization (EUA). This EUA will remain  in effect (meaning this test can be used) for the duration of the COVID-19 declaration under Section 564(b)(1) of the Act, 21 U.S.C. section 360bbb-3(b)(1), unless the  authorization is terminated or revoked sooner.    Influenza A by PCR NEGATIVE NEGATIVE Final   Influenza B by PCR NEGATIVE NEGATIVE Final    Comment: (NOTE) The Xpert Xpress SARS-CoV-2/FLU/RSV assay is intended as an aid in  the diagnosis of influenza from Nasopharyngeal swab specimens and  should not be used as a sole basis for treatment. Nasal washings and  aspirates are unacceptable for Xpert Xpress SARS-CoV-2/FLU/RSV  testing. Fact Sheet for Patients: PinkCheek.be Fact Sheet for Healthcare Providers: GravelBags.it This test is not yet approved or cleared by the [place] FDA and  has been authorized for detection and/or diagnosis of SARS-CoV-2 by  FDA under an Emergency Use Authorization (EUA). This EUA will remain  in effect (meaning this test can be used) for the duration of the  Covid-19 declaration under Section 564(b)(1) of the Act, 21  U.S.C. section 360bbb-3(b)(1), unless the authorization is  terminated or revoked. Performed at [facility], [place] [address] [place], [place] [address]  Radiology Studies: DG Chest 1 View  Result Date: 02/03/2019 CLINICAL DATA:  Generalized weakness after falling EXAM: CHEST  1 VIEW COMPARISON:  05/02/2017 FINDINGS: Cardiomediastinal contours are normal accounting for portable technique. Lungs are clear. Postoperative changes are noted in the low neck. Visualized skeletal structures are unremarkable. IMPRESSION: No acute cardiopulmonary disease. Electronically Signed   By: Zetta Bills M.D.   On: 02/03/2019 19:40   DG Forearm Right  Result Date: 02/03/2019 CLINICAL DATA:  Right forearm pain, left hand pain, left shoulder pain and generalized weakness. EXAM: RIGHT FOREARM - 2 VIEW COMPARISON:  None FINDINGS: There is no evidence of fracture or other focal bone lesions. Soft tissues are unremarkable. IMPRESSION: Negative evaluation of the right forearm  Electronically Signed   By: Zetta Bills M.D.   On: 02/03/2019 19:39   CT Head Wo Contrast  Result Date: 02/03/2019 CLINICAL DATA:  Weakness and falls over last 3 days, history of renal cell carcinoma EXAM: CT HEAD WITHOUT CONTRAST TECHNIQUE: Contiguous axial images were obtained from the base of the skull through the vertex without intravenous contrast. COMPARISON:  None. FINDINGS: Brain: There is a round hemorrhagic mass seen within the posterior right cerebellum measuring 2.8 cm. The mass a significant surrounding edema causes mass effect upon the posterior fourth ventricle and left cerebellum. There is also edema extending into the left cerebellum. There is dilatation the ventricles and sulci consistent with age-related atrophy. Low-attenuation changes in the deep white matter consistent with small vessel ischemia. Vascular: No hyperdense vessel or unexpected calcification. Skull: The skull is intact. No fracture or focal lesion identified. Sinuses/Orbits: The visualized paranasal sinuses and mastoid air cells are clear. The orbits and globes intact. Other: None IMPRESSION: Hemorrhagic 2.8 cm mass within the right cerebellum with surrounding vasogenic edema causing mass effect upon the fourth ventricle and left cerebellum. There is dilatation the ventricles and sulci consistent with age-related atrophy. Low-attenuation changes in the deep white matter consistent with small vessel ischemia. These results were called by telephone at the time of interpretation on 02/03/2019 at 8:46 pm to provider [facility] , who verbally acknowledged these results. Electronically Signed   By: Prudencio Pair M.D.   On: 02/03/2019 20:54   CT Chest W Contrast  Result Date: 02/03/2019 CLINICAL DATA:  Increased weakness and falls for the last 3 days EXAM: CT CHEST, ABDOMEN, AND PELVIS WITH CONTRAST TECHNIQUE: Multidetector CT imaging of the chest, abdomen and pelvis was performed following the standard protocol  during bolus administration of intravenous contrast. CONTRAST:  121mL OMNIPAQUE IOHEXOL 350 MG/ML SOLN COMPARISON:  CT chest, abdomen and pelvis 12/02/2015 FINDINGS: CT CHEST FINDINGS Cardiovascular: The aorta is normal caliber. No dissection flap or other acute luminal abnormality of the aorta is seen. No periaortic stranding or hemorrhage. Atherosclerotic plaque within the normal caliber aorta. Normal 3 vessel branching of the arch. Central pulmonary arteries are normal caliber. No large central filling defects on this non tailored examination. Normal heart size. No pericardial effusion. Mediastinum/Nodes: No mediastinal fluid or hemorrhage. Physiologic amount of fluid present within the pericardial recesses. There is mediastinal and right hilar adenopathy. Findings include a centrally hypoattenuating 1.9 cm subcarinal nodule (4/27) and a 1.4 cm right hilar node with central hypoattenuation (4/27). There is a borderline enlarged right axillary node measuring 10 mm (4/32). Few nodular soft tissue densities in the upper-outer quadrant of the right breast with a more focal cutaneous based lesion in the lower inner quadrant measuring up to 2 cm in size. Lungs/Pleura:  There is a multilobulated mass lesion abutting the pleura with acute angular margins in the medial aspect of the right middle lobe. This measures up to 3 cm in size (6/87). Smaller adjacent 9 mm satellite nodule is noted (6/76 few additional satellite nodules are noted adjacent the right hilar vessels and bronchi. Subpleural 5 mm nodule noted in the lateral left upper lobe (6/64). No acute traumatic abnormality of the lung parenchyma. No consolidation, features of edema, pneumothorax, or effusion. Musculoskeletal: Dedicated thoracic spine reconstructions were generated, please see that report for further details of the spine. No visible displaced rib fractures. No suspicious osseous lesions in the chest CT ABDOMEN PELVIS FINDINGS Hepatobiliary: No direct  hepatic injury or perihepatic hematoma. Subcentimeter hypoattenuating focus in the posterior right lobe liver (3/2) is too small to fully characterize on CT imaging. No focal liver abnormality is seen. No gallstones, gallbladder wall thickening, or biliary dilatation. Pancreas: Unremarkable. No pancreatic ductal dilatation or surrounding inflammatory changes. Spleen: Normal in size without focal abnormality. Adrenals/Urinary Tract: Irregular centrally hypoattenuating 3.3 cm lesion in the left adrenal gland. Additional hypertense 1.6 cm nodule in the lateral limb right adrenal gland. No convincing adrenal hemorrhage is seen however patient appears to be post left nephrectomy with some surgical material in the left nephrectomy bed. No right renal injury or perirenal hemorrhage. No suspicious renal lesion. Normal enhancement and excretion without extravasation of contrast from the collecting system on urinary phase delays. Mild bladder wall thickening. Faint perivesicular haze. No direct bladder injury. Stomach/Bowel: Distal esophagus, stomach and duodenal sweep are unremarkable. No small bowel wall thickening or dilatation. No evidence of obstruction. A normal appendix is visualized. No colonic dilatation or wall thickening. No sites of mesenteric hematoma or contusion. Vascular/Lymphatic: No direct vascular injury in the abdomen or pelvis. Few prominent retroperitoneal nodes are noted including a 8 mm periaortic lymph node (4/72). Reproductive: Patient is post hysterectomy. No concerning adnexal lesions. Postsurgical changes noted in the low anterior abdomen Other: Mild body wall edema. No large body wall hematoma. No bowel containing hernia. No traumatic abdominal wall injury or dehiscence. Musculoskeletal: Dedicated lumbar spine reconstructions were generated, please see that report for further details. No abnormalities of the bony pelvis. No intramuscular hematoma or other acute muscular abnormality is seen.  Slight age related atrophy is noted. IMPRESSION: 1. Compression deformities at T12 and L3, better detailed dedicated thoracolumbar spine reconstructions. Please see that report for further details. 2. No other evidence of acute traumatic injury to the chest, abdomen or pelvis. 3. Lobulated mass lesion in the medial aspect of the right middle lobe with adjacent satellite nodules and mediastinal and right hilar adenopathy. 4. Soft tissue nodule in the lower inner quadrant of the right breast with additional nodularity in the upper-outer quadrant of the same breast. Should correlate with mammography and dedicated breast imaging. 5. Bilateral adrenal masses concerning for metastatic disease. 6. Subcentimeter hypoattenuating focus in the right lobe liver as well as some prominent though nonenlarged lymph nodes, while these are typically characterized as benign incidental findings, should be viewed with some suspicion given the findings above. 7. Mild bladder wall thickening with subtle perivesicular haze, which may represent cystitis. 8. Prior left nephrectomy. 9.  Aortic Atherosclerosis (ICD10-I70.0). These results were called by telephone at the time of interpretation on 02/03/2019 at 9:12 pm to provider [facility] , who verbally acknowledged these results. Electronically Signed   By: Lovena Le M.D.   On: 02/03/2019 21:14   CT Cervical Spine Wo Contrast  Result Date: 02/03/2019 CLINICAL DATA:  Fall. EXAM: CT CERVICAL SPINE WITHOUT CONTRAST TECHNIQUE: Multidetector CT imaging of the cervical spine was performed without intravenous contrast. Multiplanar CT image reconstructions were also generated. COMPARISON:  MRI cervical spine 12/27/2011 FINDINGS: Alignment: Normal Skull base and vertebrae: Negative for fracture Soft tissues and spinal canal: Negative for soft tissue mass or swelling. Prior thyroidectomy. Disc levels: Disc degeneration and spurring multiple levels. Bilateral foraminal stenosis at  C3-4, C4-5, C5-6, C6-7 due to spurring. Upper chest: Lung apices clear bilaterally. Other: There is mass-effect on the posterior fossa with effacement of the fourth ventricle and obstructive hydrocephalus. There is question of high-density subdural blood in the right posterior fossa. This area is not well evaluated due to streak artifact. Recommend stat head CT. IMPRESSION: Abnormal mass-effect in the posterior fossa with obstructive hydrocephalus. Question subdural hematoma or mass in the posterior fossa on the right. Recommend stat head CT. Cervical spondylosis without fracture These results were called by telephone at the time of interpretation on 02/03/2019 at 8:49 pm to provider [facility] , who verbally acknowledged these results. Electronically Signed   By: Franchot Gallo M.D.   On: 02/03/2019 20:50   MR BRAIN W WO CONTRAST  Result Date: 02/04/2019 CLINICAL DATA:  Occurrence falls and weakness. History of multiple carcinomas. EXAM: MRI HEAD WITHOUT AND WITH CONTRAST TECHNIQUE: Multiplanar, multiecho pulse sequences of the brain and surrounding structures were obtained without and with intravenous contrast. CONTRAST:  70mL GADAVIST GADOBUTROL 1 MMOL/ML IV SOLN COMPARISON:  Head CT 02/03/2019 FINDINGS: Brain: There is a lesion of the right cerebellar hemisphere with nodular peripheral contrast enhancement, measuring 2.8 x 2.6 cm. There is associated hemorrhage is demonstrated on the earlier head CT. This causes moderate edema in the right cerebellar hemisphere with mass effect on the fourth ventricle. There is also a lesion of the superior cerebellar vermis that measures 1.5 x 1.4 cm. There are subcentimeter lesions of the left deep gray nuclei, left occipital lobe, left parietal white matter and left cerebellum. Early confluent hyperintense T2-weighted signal of the periventricular and deep white matter, most commonly due to chronic ischemic microangiopathy. Advanced atrophy for age.  Blood-sensitive sequences show a single focus of chronic microhemorrhage in the right frontal white matter. The midline structures are normal. Vascular: Normal flow voids. Skull and upper cervical spine: Normal marrow signal. Sinuses/Orbits: Negative. Other: None. IMPRESSION: 1. Seven intracranial metastatic lesions, the largest of which is in the right cerebellar hemisphere and measures up to 2.6 cm with associated hemorrhage and moderate mass effect on the fourth ventricle. 2. Advanced atrophy and chronic ischemic microangiopathy. Lesions are annotated on series 11, axial postcontrast T1-weighted imaging. Electronically Signed   By: Ulyses Jarred M.D.   On: 02/04/2019 01:28   CT ABDOMEN PELVIS W CONTRAST  Result Date: 02/03/2019 CLINICAL DATA:  Increased weakness and falls for the last 3 days EXAM: CT CHEST, ABDOMEN, AND PELVIS WITH CONTRAST TECHNIQUE: Multidetector CT imaging of the chest, abdomen and pelvis was performed following the standard protocol during bolus administration of intravenous contrast. CONTRAST:  167mL OMNIPAQUE IOHEXOL 350 MG/ML SOLN COMPARISON:  CT chest, abdomen and pelvis 12/02/2015 FINDINGS: CT CHEST FINDINGS Cardiovascular: The aorta is normal caliber. No dissection flap or other acute luminal abnormality of the aorta is seen. No periaortic stranding or hemorrhage. Atherosclerotic plaque within the normal caliber aorta. Normal 3 vessel branching of the arch. Central pulmonary arteries are normal caliber. No large central filling defects on this non tailored examination. Normal heart  size. No pericardial effusion. Mediastinum/Nodes: No mediastinal fluid or hemorrhage. Physiologic amount of fluid present within the pericardial recesses. There is mediastinal and right hilar adenopathy. Findings include a centrally hypoattenuating 1.9 cm subcarinal nodule (4/27) and a 1.4 cm right hilar node with central hypoattenuation (4/27). There is a borderline enlarged right axillary node  measuring 10 mm (4/32). Few nodular soft tissue densities in the upper-outer quadrant of the right breast with a more focal cutaneous based lesion in the lower inner quadrant measuring up to 2 cm in size. Lungs/Pleura: There is a multilobulated mass lesion abutting the pleura with acute angular margins in the medial aspect of the right middle lobe. This measures up to 3 cm in size (6/87). Smaller adjacent 9 mm satellite nodule is noted (6/76 few additional satellite nodules are noted adjacent the right hilar vessels and bronchi. Subpleural 5 mm nodule noted in the lateral left upper lobe (6/64). No acute traumatic abnormality of the lung parenchyma. No consolidation, features of edema, pneumothorax, or effusion. Musculoskeletal: Dedicated thoracic spine reconstructions were generated, please see that report for further details of the spine. No visible displaced rib fractures. No suspicious osseous lesions in the chest CT ABDOMEN PELVIS FINDINGS Hepatobiliary: No direct hepatic injury or perihepatic hematoma. Subcentimeter hypoattenuating focus in the posterior right lobe liver (3/2) is too small to fully characterize on CT imaging. No focal liver abnormality is seen. No gallstones, gallbladder wall thickening, or biliary dilatation. Pancreas: Unremarkable. No pancreatic ductal dilatation or surrounding inflammatory changes. Spleen: Normal in size without focal abnormality. Adrenals/Urinary Tract: Irregular centrally hypoattenuating 3.3 cm lesion in the left adrenal gland. Additional hypertense 1.6 cm nodule in the lateral limb right adrenal gland. No convincing adrenal hemorrhage is seen however patient appears to be post left nephrectomy with some surgical material in the left nephrectomy bed. No right renal injury or perirenal hemorrhage. No suspicious renal lesion. Normal enhancement and excretion without extravasation of contrast from the collecting system on urinary phase delays. Mild bladder wall thickening.  Faint perivesicular haze. No direct bladder injury. Stomach/Bowel: Distal esophagus, stomach and duodenal sweep are unremarkable. No small bowel wall thickening or dilatation. No evidence of obstruction. A normal appendix is visualized. No colonic dilatation or wall thickening. No sites of mesenteric hematoma or contusion. Vascular/Lymphatic: No direct vascular injury in the abdomen or pelvis. Few prominent retroperitoneal nodes are noted including a 8 mm periaortic lymph node (4/72). Reproductive: Patient is post hysterectomy. No concerning adnexal lesions. Postsurgical changes noted in the low anterior abdomen Other: Mild body wall edema. No large body wall hematoma. No bowel containing hernia. No traumatic abdominal wall injury or dehiscence. Musculoskeletal: Dedicated lumbar spine reconstructions were generated, please see that report for further details. No abnormalities of the bony pelvis. No intramuscular hematoma or other acute muscular abnormality is seen. Slight age related atrophy is noted. IMPRESSION: 1. Compression deformities at T12 and L3, better detailed dedicated thoracolumbar spine reconstructions. Please see that report for further details. 2. No other evidence of acute traumatic injury to the chest, abdomen or pelvis. 3. Lobulated mass lesion in the medial aspect of the right middle lobe with adjacent satellite nodules and mediastinal and right hilar adenopathy. 4. Soft tissue nodule in the lower inner quadrant of the right breast with additional nodularity in the upper-outer quadrant of the same breast. Should correlate with mammography and dedicated breast imaging. 5. Bilateral adrenal masses concerning for metastatic disease. 6. Subcentimeter hypoattenuating focus in the right lobe liver as well as some prominent  though nonenlarged lymph nodes, while these are typically characterized as benign incidental findings, should be viewed with some suspicion given the findings above. 7. Mild bladder  wall thickening with subtle perivesicular haze, which may represent cystitis. 8. Prior left nephrectomy. 9.  Aortic Atherosclerosis (ICD10-I70.0). These results were called by telephone at the time of interpretation on 02/03/2019 at 9:12 pm to provider [facility] , who verbally acknowledged these results. Electronically Signed   By: Lovena Le M.D.   On: 02/03/2019 21:14   CT T-SPINE NO CHARGE  Result Date: 02/03/2019 CLINICAL DATA:  Increasing weakness and multiple falls for 3 days EXAM: CT THORACIC AND LUMBAR SPINE WITHOUT CONTRAST TECHNIQUE: Multiplanar CT images of the thoracic and lumbar spine were reconstructed from contemporary CT of the Chest, abdomen and pelvis. CONTRAST:  None or No additional COMPARISON:  Chest radiograph 02/03/2019 CT abdomen pelvis 05/04/2015, chest radiograph 05/02/2017, lumbar radiographs 10/13/2018 FINDINGS: THORACIC SPINE: Alignment: There is straightening of the normal thoracic kyphosis throughout the lower thoracic levels with focal kyphotic curvature at the level of the T12 compression deformity. Mild dextrocurvature throughout the thoracic spine with an apex at the T11 level. No traumatic listhesis. No abnormal widened, jumped or perched facets. Vertebrae: There is multilevel Schmorl's node formations. Anterior wedging compression deformity of T12 (AO spine A1) with approximately 50% height loss anteriorly some persistent lucency suggesting acuity. No other acute osseous injury or compression deformity is seen in the thoracic spine. No suspicious osseous lesions. Paraspinal and other soft tissues: Small amount paravertebral soft tissue thickening/stranding adjacent the T12 compression deformity. Finding favors acuity. For findings within the posterior chest and mediastinum, see dedicated CT from which this study is reconstructed. Disc levels: There is diffuse multilevel intervertebral disc height loss with discogenic endplate changes and some posterior disc  osteophyte formation at T4-T5, T5-T6 and T7-T8 but without significant resulting canal stenosis or neural foraminal narrowing in the imaged thoracic spine. LUMBAR SPINE: Segmentation: 5 non-rib-bearing lumbar type vertebral levels. Alignment: There is slight levocurvature of the lumbar spine with an apex at L3. No abnormally widened, jumped or perched facets. Vertebrae: There is a split/pincer type compression deformity of L3 (AO spine A2). Central height loss of approximately 50%. No abnormal posterior wall involvement or retropulsion of fracture fragments. No additional acute fracture is seen. Posterior elements are intact. No suspicious osseous lesions. Multilevel Schmorl's node formations seen in the lumbar spine. Paraspinal and other soft tissues: Small amount of paravertebral soft tissue thickening at the L3 level suggests acuity. No visible canal hematoma. For findings in the posterior abdomen and pelvis, see dedicated CT from which this study is reconstructed. Disc levels: Multilevel intervertebral disc height loss throughout the lumbar spine with multilevel global disc bulges. Central disc protrusions at L2 and L3 result in mild canal stenosis. A left central disc protrusion at L1 results in more moderate canal narrowing. Facet hypertrophic changes are maximal at L5-S1 with resulting mild to moderate multilevel neural foraminal narrowing the lumbar spine. IMPRESSION: 1. Anterior wedging compression deformity T12 with 50% height loss anteriorly (AOSpineA1). 2. Split/Pincer type compression deformity of L3 with approximately 50% height loss anteriorly (AOSpine A2). 3. Faint paraspinal soft tissue thickening adjacent both these deformities suggest acuity. 4. No other acute osseous injuries in the thoracic or lumbar spine. 5. Multilevel degenerative changes of the thoracic and lumbar spine, as described above. 6. For findings in the posterior chest, abdomen and pelvis, see dedicated CT from which this study is  reconstructed. Electronically Signed  By: Lovena Le M.D.   On: 02/03/2019 20:56   CT L-SPINE NO CHARGE  Result Date: 02/03/2019 CLINICAL DATA:  Increasing weakness and multiple falls for 3 days EXAM: CT THORACIC AND LUMBAR SPINE WITHOUT CONTRAST TECHNIQUE: Multiplanar CT images of the thoracic and lumbar spine were reconstructed from contemporary CT of the Chest, abdomen and pelvis. CONTRAST:  None or No additional COMPARISON:  Chest radiograph 02/03/2019 CT abdomen pelvis 05/04/2015, chest radiograph 05/02/2017, lumbar radiographs 10/13/2018 FINDINGS: THORACIC SPINE: Alignment: There is straightening of the normal thoracic kyphosis throughout the lower thoracic levels with focal kyphotic curvature at the level of the T12 compression deformity. Mild dextrocurvature throughout the thoracic spine with an apex at the T11 level. No traumatic listhesis. No abnormal widened, jumped or perched facets. Vertebrae: There is multilevel Schmorl's node formations. Anterior wedging compression deformity of T12 (AO spine A1) with approximately 50% height loss anteriorly some persistent lucency suggesting acuity. No other acute osseous injury or compression deformity is seen in the thoracic spine. No suspicious osseous lesions. Paraspinal and other soft tissues: Small amount paravertebral soft tissue thickening/stranding adjacent the T12 compression deformity. Finding favors acuity. For findings within the posterior chest and mediastinum, see dedicated CT from which this study is reconstructed. Disc levels: There is diffuse multilevel intervertebral disc height loss with discogenic endplate changes and some posterior disc osteophyte formation at T4-T5, T5-T6 and T7-T8 but without significant resulting canal stenosis or neural foraminal narrowing in the imaged thoracic spine. LUMBAR SPINE: Segmentation: 5 non-rib-bearing lumbar type vertebral levels. Alignment: There is slight levocurvature of the lumbar spine with an apex  at L3. No abnormally widened, jumped or perched facets. Vertebrae: There is a split/pincer type compression deformity of L3 (AO spine A2). Central height loss of approximately 50%. No abnormal posterior wall involvement or retropulsion of fracture fragments. No additional acute fracture is seen. Posterior elements are intact. No suspicious osseous lesions. Multilevel Schmorl's node formations seen in the lumbar spine. Paraspinal and other soft tissues: Small amount of paravertebral soft tissue thickening at the L3 level suggests acuity. No visible canal hematoma. For findings in the posterior abdomen and pelvis, see dedicated CT from which this study is reconstructed. Disc levels: Multilevel intervertebral disc height loss throughout the lumbar spine with multilevel global disc bulges. Central disc protrusions at L2 and L3 result in mild canal stenosis. A left central disc protrusion at L1 results in more moderate canal narrowing. Facet hypertrophic changes are maximal at L5-S1 with resulting mild to moderate multilevel neural foraminal narrowing the lumbar spine. IMPRESSION: 1. Anterior wedging compression deformity T12 with 50% height loss anteriorly (AOSpineA1). 2. Split/Pincer type compression deformity of L3 with approximately 50% height loss anteriorly (AOSpine A2). 3. Faint paraspinal soft tissue thickening adjacent both these deformities suggest acuity. 4. No other acute osseous injuries in the thoracic or lumbar spine. 5. Multilevel degenerative changes of the thoracic and lumbar spine, as described above. 6. For findings in the posterior chest, abdomen and pelvis, see dedicated CT from which this study is reconstructed. Electronically Signed   By: Lovena Le M.D.   On: 02/03/2019 20:56   DG Shoulder Left  Result Date: 02/03/2019 CLINICAL DATA:  Shoulder pain EXAM: LEFT SHOULDER - 2+ VIEW COMPARISON:  None FINDINGS: There is no evidence of fracture or dislocation. There is no evidence of arthropathy  or other focal bone abnormality. Soft tissues are unremarkable. IMPRESSION: Negative evaluation of the left shoulder. Electronically Signed   By: Jewel Baize.D.  On: 02/03/2019 19:43   DG Hand Complete Left  Result Date: 02/03/2019 CLINICAL DATA:  Hand pain EXAM: LEFT HAND - COMPLETE 3+ VIEW COMPARISON:  None FINDINGS: Degenerative changes at the first carpometacarpal joint. No signs of fracture or dislocation. Soft tissues are unremarkable. IMPRESSION: Degenerative changes without fracture. Electronically Signed   By: Zetta Bills M.D.   On: 02/03/2019 19:42        Scheduled Meds: . sodium chloride   Intravenous Once  . [MAR Hold] buPROPion  200 mg Oral Daily  . [MAR Hold] busPIRone  15 mg Oral BID  . [MAR Hold] dexamethasone (DECADRON) injection  4 mg Intravenous Q6H  . [MAR Hold] docusate sodium  100 mg Oral BID  . fentaNYL      . [MAR Hold] irbesartan  300 mg Oral Daily  . [MAR Hold] levothyroxine  88 mcg Oral Q0600  . [MAR Hold] pramipexole  0.5 mg Oral QHS  . remifentanil (ULTIVA) 2 mg in 100 mL normal saline (20 mcg/mL) Optime  0.0125 mcg/kg/min Intravenous To OR  . [MAR Hold] rosuvastatin  10 mg Oral QHS  . [MAR Hold] sertraline  100 mg Oral Daily   Continuous Infusions: . sodium chloride 100 mL/hr at 02/03/19 2223  . sodium chloride 200 mL/hr at 02/04/19 1557  . [MAR Hold] levETIRAcetam Stopped (02/04/19 0326)  . vancomycin       LOS: 1 day     Cordelia Poche, MD [facility] 02/04/2019, 4:54 PM  If 7PM-7AM, please contact night-coverage [email or web address]

## 2019-02-04 NOTE — Anesthesia Postprocedure Evaluation (Signed)
Anesthesia Post Note  Patient: Krista Hodges  Procedure(s) Performed: Suboccipital CRANIECTOMY for  TUMOR EXCISION (N/A )     Patient location during evaluation: PACU Anesthesia Type: General Level of consciousness: awake and alert Pain management: pain level controlled Vital Signs Assessment: post-procedure vital signs reviewed and stable Respiratory status: spontaneous breathing, nonlabored ventilation, respiratory function stable and patient connected to nasal cannula oxygen Cardiovascular status: blood pressure returned to baseline and stable Postop Assessment: no apparent nausea or vomiting Anesthetic complications: no    Last Vitals:  Vitals:   02/04/19 1815 02/04/19 1830  BP: (!) 148/83 (!) 154/70  Pulse: (!) 59 (!) 48  Resp: 16 18  Temp:    SpO2: 94% 100%    Last Pain:  Vitals:   02/04/19 1715  TempSrc:   PainSc: Tyler Deis

## 2019-02-04 NOTE — Anesthesia Procedure Notes (Addendum)
Arterial Line Insertion Start/End12/23/2020 10:10 AM, 02/04/2019 10:16 AM Performed by: Suzette Battiest, MD, Trinna Post., CRNA  Patient location: Pre-op. Lidocaine 1% used for infiltration Left, radial was placed Catheter size: 20 G Hand hygiene performed , maximum sterile barriers used  and Seldinger technique used  Attempts: 1 Procedure performed without using ultrasound guided technique. Following insertion, Biopatch and dressing applied. Post procedure assessment: normal  Patient tolerated the procedure well with no immediate complications. Additional procedure comments: Performed by Suszanne Finch.

## 2019-02-04 NOTE — Transfer of Care (Signed)
Immediate Anesthesia Transfer of Care Note  Patient: Krista Hodges  Procedure(s) Performed: Suboccipital CRANIECTOMY for  TUMOR EXCISION (N/A )  Patient Location: PACU  Anesthesia Type:General  Level of Consciousness: awake and drowsy  Airway & Oxygen Therapy: Patient Spontanous Breathing and Patient connected to nasal cannula oxygen  Post-op Assessment: Report given to RN, Post -op Vital signs reviewed and stable, Patient moving all extremities X 4 and Patient able to stick tongue midline  Post vital signs: Reviewed and stable  Last Vitals:  Vitals Value Taken Time  BP 140/81 02/04/19 1615  Temp    Pulse 80 02/04/19 1619  Resp 20 02/04/19 1619  SpO2 100 % 02/04/19 1619  Vitals shown include unvalidated device data.  Last Pain:  Vitals:   02/04/19 0741  TempSrc: Oral  PainSc:          Complications: No apparent anesthesia complications

## 2019-02-04 NOTE — Anesthesia Preprocedure Evaluation (Signed)
Anesthesia Evaluation  Patient identified by MRN, date of birth, ID band Patient awake    Reviewed: Allergy & Precautions, NPO status , Patient's Chart, lab work & pertinent test results  Airway Mallampati: II  TM Distance: >3 FB Neck ROM: Full    Dental  (+) Dental Advisory Given   Pulmonary former smoker,  Lung mass   breath sounds clear to auscultation       Cardiovascular hypertension, Pt. 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)     Musculoskeletal  (+) Arthritis ,   Abdominal   Peds  Hematology  (+) anemia ,   Anesthesia Other Findings   Reproductive/Obstetrics                             Lab Results  Component Value Date   WBC 13.0 (H) 02/03/2019   HGB 11.0 (L) 02/03/2019   HCT 34.4 (L) 02/03/2019   MCV 96.1 02/03/2019   PLT 332 02/03/2019   Lab Results  Component Value Date   CREATININE 0.92 02/04/2019   BUN 16 02/04/2019   NA 135 02/04/2019   K 3.4 (L) 02/04/2019   CL 100 02/04/2019   CO2 25 02/04/2019    Anesthesia Physical Anesthesia Plan  ASA: III  Anesthesia Plan: General   Post-op Pain Management:    Induction: Intravenous  PONV Risk Score and Plan: 3 and Dexamethasone, Ondansetron and Treatment may vary due to age or medical condition  Airway Management Planned: Oral ETT  Additional Equipment: Arterial line  Intra-op Plan:   Post-operative Plan: Extubation in OR  Informed Consent: I have reviewed the patients History and Physical, chart, labs and discussed the procedure including the risks, benefits and alternatives for the proposed anesthesia with the patient or authorized representative who has indicated his/her understanding and acceptance.     Dental  advisory given  Plan Discussed with: CRNA  Anesthesia Plan Comments:         Anesthesia Quick Evaluation

## 2019-02-04 NOTE — Progress Notes (Addendum)
Pharmacy Antibiotic Note  Krista Hodges is a 63 y.o. female admitted on 02/03/2019 with cerebellar mass s/p craniotomy. Pharmacy has been consulted for vancomycin dosing for surgical ppx x24h. Cr <1, pt received vancomycin 1000mg  x1 ~1200.  Plan: -Vancomycin 750mg  IV q12h x2 -Pharmacy will sign off, reconsult if needed   Height: 5\' 6"  (167.6 cm) Weight: 143 lb 4.8 oz (65 kg) IBW/kg (Calculated) : 59.3  Temp (24hrs), Avg:98 F (36.7 C), Min:97.8 F (36.6 C), Max:98.1 F (36.7 C)  Recent Labs  Lab 02/03/19 1827 02/04/19 0342 02/04/19 1307  WBC 13.0*  --  10.5  CREATININE 1.04* 0.92  --     Estimated Creatinine Clearance: 57.1 mL/min (by C-G formula based on SCr of 0.92 mg/dL).    Allergies  Allergen Reactions  . Penicillins Hives    Whelts. Has tolerated cephazolin and cephalexin. Has patient had a PCN reaction causing immediate rash, facial/tongue/throat swelling, SOB or lightheadedness with hypotension: Yes (per pt report) Has patient had a PCN reaction causing severe rash involving mucus membranes or skin necrosis: Yes Has patient had a PCN reaction that required hospitalization: No Has patient had a PCN reaction occurring within the last 10 years: No If all of the above answers are "NO", then may proceed with Cephalosporin  . Aspirin     Cannot take due to nephrectomy  . Keppra [Levetiracetam] Nausea Only    Anxiety   . Nsaids     Cannot take due to nephrectomy  . Requip [Ropinirole Hcl]     Headaches, restlessness      Thank you for allowing pharmacy to be a part of this patient's care.   Arrie Senate, PharmD, BCPS Clinical Pharmacist [phone] Please check AMION for all [facility] numbers 02/04/2019

## 2019-02-04 NOTE — Op Note (Signed)
NEUROSURGERY OPERATIVE NOTE   PREOP DIAGNOSIS:  1.  Right cerebellar tumor  POSTOP DIAGNOSIS: Same  PROCEDURE: Suboccipital craniotomy for resection of cerebellar tumor Use of intraoperative microscope for microdissection  SURGEON: Dr. Lisbeth Renshaw, MD  ASSISTANT: Julianne Handler, PA-C  ANESTHESIA: General Endotracheal  EBL: 200 cc  SPECIMENS: Cerebellar tumor for permanent pathology  DRAINS: None  COMPLICATIONS: None immediate  CONDITION: Hemodynamically stable to postanesthesia care unit  HISTORY: Krista Hodges is a 63 y.o. female presenting to the emergency department with several weeks of progressively worsening gait instability.  She suffered a fall prompting her visit to the ED.  Work-up in the emergency department included CT of the brain which demonstrated ventriculomegaly and a large right-sided posterior fossa tumor.  Patient has a history of possibly endometrial cancer remotely, and more recently renal cell carcinoma status post left nephrectomy and papillary thyroid cancer status post thyroidectomy.  CT of the chest abdomen pelvis was also completed demonstrating multiple breast masses, a right-sided lung mass, possible liver mass, and bilateral adrenal masses.  MRI confirmed the presence of a large right tonsillar cerebellar lesion as well as a relatively large bony lesion and subcentimeter 4 supratentorial metastases.  With the size of the right tonsillar lesion, associated hydrocephalus, and lack of pathologic diagnosis, surgical resection was indicated.  The risks and benefits of the surgery were reviewed in detail with the patient and her daughter.  After all questions were answered informed consent was obtained and witnessed.  PROCEDURE IN DETAIL: The patient was brought to the operating room. After induction of general anesthesia, the patient was positioned on the operative table in the [place] head holder in the prone position. All pressure points were  meticulously padded. Skin incision was then marked out and prepped and draped in the usual sterile fashion.  After timeout was conducted, the midline skin incision was infiltrated with local anesthetic with epinephrine.  Incision was then made sharply and carried down through the subcutaneous fat.  The nuchal fascia was then identified.  Fascia was incised in a Y-shaped fashion based superiorly.  Suboccipital musculature was then divided in the avascular midline plane.  The spinous process of C1, C2, as well as the occipital bone were all identified and dissected in the subperiosteal plane.  Self-retaining retractors were then placed.  A suboccipital craniotomy was fashioned using high-speed drill and Kerrison punches.  The opening of the foramen magnum was widened on the right side to the lateral edge.  I did further extend the craniotomy superiorly and to the right using Leksell rongeurs.  At this point, the dura was opened in a Y-shaped fashion across the cerebellar tonsils and somewhat inferiorly to the level of C1.  Hemostasis was secured on the epidural plane using a combination of 4-0 Nurolon stitches and bipolar electrocautery, as well as morselized Gelfoam and thrombin.  At this point the microscope was draped sterilely and brought in the field and the remainder of the case was done under the microscope using microdissection technique.  The arachnoid overlying the cerebellar tonsils was incised and we got good flow of CSF.  The arachnoid plane between the 2 tonsils was then identified and exploited to identify the obex.  Choroid plexus in the inferior fourth ventricle was identified as were both posterior inferior cerebellar arteries.  The right PICA was dissected away from the cerebellar tonsil in order to protect it from the ensuing resection.  At this point, the cerebellar pia was coagulated with the bipolar electrocautery.  Utilizing the ultrasonic aspirator and bipolar electrocautery, I  immediately encountered the tumor underneath the right cerebellar tonsil which was noted to be somewhat firm in consistency and slightly bluish in color.  The tumor was then circumferentially dissected away from the surrounding cerebellar white matter initially superiorly, laterally, and inferiorly and finally medially.  The deep plane was then dissected free taking care to prevent deeper dissection approaching the medulla.  The tumor was then removed en bloc and sent for permanent pathology.  At this point the resection cavity was inspected and the Wille Glaser was used to further remove any remaining tumor in the deep margin.  The cavity appeared to be free of any identifiable tumor and appeared to be surrounded by normal cerebellar white matter.  Hemostasis was then secured using a combination of bipolar electrocautery and morselized Gelfoam with thrombin.  The wound was then irrigated with copious amounts of normal saline irrigation.  The dural leaflets were contracted and I was unable to achieve watertight closure of the dura.  It therefore elected to place a dural inlay graft.  The leaflets were then reapproximated with 4-0 Nurolon stitches.  An onlay graft was then placed.  The bone flap was then replaced and plated with standard titanium plates and screws.  The entire suboccipital region was then covered with a layer of polyethylene glycol dural sealant.  Self-retaining retractors were then removed.  The layer was then closed in multiple layers using interrupted 0 Vicryl and 3-0 Vicryl stitches.  Skin was closed with staples.  Bacitracin ointment and sterile dressing was applied.  The patient was then transferred to the stretcher and the [place] head holder was removed.  She was then extubated and taken to the postanesthesia care unit in stable hemodynamic condition.  At the end of the case all sponge, needle, instrument, and cottonoid counts were correct.

## 2019-02-05 ENCOUNTER — Encounter: Payer: Self-pay | Admitting: *Deleted

## 2019-02-05 ENCOUNTER — Inpatient Hospital Stay (HOSPITAL_COMMUNITY): Payer: Medicare Other

## 2019-02-05 ENCOUNTER — Other Ambulatory Visit: Payer: Self-pay

## 2019-02-05 LAB — BASIC METABOLIC PANEL
Anion gap: 9 (ref 5–15)
BUN: 16 mg/dL (ref 8–23)
CO2: 21 mmol/L — ABNORMAL LOW (ref 22–32)
Calcium: 9.5 mg/dL (ref 8.9–10.3)
Chloride: 108 mmol/L (ref 98–111)
Creatinine, Ser: 0.87 mg/dL (ref 0.44–1.00)
GFR calc Af Amer: >60 mL/min (ref >60)
GFR calc non Af Amer: >60 mL/min (ref >60)
Glucose, Bld: 115 mg/dL — ABNORMAL HIGH (ref 70–99)
Potassium: 3.5 mmol/L (ref 3.5–5.1)
Sodium: 138 mmol/L (ref 135–145)

## 2019-02-05 LAB — CBC
HCT: 28.7 % — ABNORMAL LOW (ref 36.0–46.0)
Hemoglobin: 9.2 g/dL — ABNORMAL LOW (ref 12.0–15.0)
MCH: 30.6 pg (ref 26.0–34.0)
MCHC: 32.1 g/dL (ref 30.0–36.0)
MCV: 95.3 fL (ref 80.0–100.0)
Platelets: 296 10*3/uL (ref 150–400)
RBC: 3.01 MIL/uL — ABNORMAL LOW (ref 3.87–5.11)
RDW: 13.2 % (ref 11.5–15.5)
WBC: 15.3 10*3/uL — ABNORMAL HIGH (ref 4.0–10.5)
nRBC: 0 % (ref 0.0–0.2)

## 2019-02-05 LAB — HEMOGLOBIN A1C
Hgb A1c MFr Bld: 5.6 % (ref 4.8–5.6)
Mean Plasma Glucose: 114.02 mg/dL

## 2019-02-05 LAB — MRSA PCR SCREENING: MRSA by PCR: NEGATIVE

## 2019-02-05 MED ORDER — GADOBUTROL 1 MMOL/ML IV SOLN
6.0000 mL | Freq: Once | INTRAVENOUS | Status: AC | PRN
  Administered 2019-02-05: 07:00:00 6 mL via INTRAVENOUS

## 2019-02-05 MED FILL — Gelatin Absorbable MT Powder: OROMUCOSAL | Qty: 1 | Status: AC

## 2019-02-05 MED FILL — Thrombin For Soln 5000 Unit: CUTANEOUS | Qty: 5000 | Status: AC

## 2019-02-05 NOTE — Progress Notes (Signed)
OT Cancellation Note  Patient Details Name: Krista Hodges MRN: [id number] DOB: 09/26/1955   Cancelled Treatment:    Reason Eval/Treat Not Completed: Pt awaiting TLSO to mobilize OOB.  Nilsa Nutting., OTR/L Acute Rehabilitation Services Pager [phone] Office [phone]   Lucille Passy M 02/05/2019, 9:50 AM

## 2019-02-05 NOTE — Progress Notes (Signed)
Subjective: Patient underwent postoperative MRI this morning, and was given Dilaudid 1 mg IV at about 0600.  Nursing staff reports that she has been drowsy since dose of Dilaudid, but was more alert prior to it.  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  No other new changes intracranially.  Objective: Vital signs in last 24 hours: Vitals:   02/05/19 0600 02/05/19 0700 02/05/19 0708 02/05/19 0800  BP: 115/61  110/67   Pulse: (!) 49 (!) 46    Resp: 12 11    Temp:    98.6 F (37 C)  TempSrc:    Axillary  SpO2: 93% 96%    Weight:      Height:        Intake/Output from previous day: 12/23 0701 - 12/24 0700 In: 3650.5 [I.V.:3050.3; IV Piggyback:600.2] Out: 1710 [Urine:1510; Blood:200] Intake/Output this shift: No intake/output data recorded.  Physical Exam: Drowsy, but arousable to voice.  Oriented to name and hospital, but not month and year.  Following commands.  Moving all 4 extremities.  Dressing not intact, nursing staff to change dressing.  CBC Recent Labs    02/04/19 1307 02/05/19 0653  WBC 10.5 15.3*  HGB 9.2* 9.2*  HCT 28.6* 28.7*  PLT 309 296   BMET Recent Labs    02/03/19 1827 02/04/19 0342 02/04/19 1244 02/04/19 1251  NA 135 135 139 138  K 2.7* 3.4* 3.0* 3.3*  CL 98 100  --   --   CO2 26 25  --   --   GLUCOSE 154* 130*  --   --   BUN 18 16  --   --   CREATININE 1.04* 0.92  --   --   CALCIUM 9.5 9.6  --   --     Studies/Results: DG Chest 1 View  Result Date: 02/03/2019 CLINICAL DATA:  Generalized weakness after falling EXAM: CHEST  1 VIEW COMPARISON:  05/02/2017 FINDINGS: Cardiomediastinal contours are normal accounting for portable technique. Lungs are clear. Postoperative changes are noted in the low neck. Visualized skeletal structures are unremarkable. IMPRESSION: No acute cardiopulmonary disease. Electronically Signed   By: Zetta Bills M.D.   On: 02/03/2019 19:40   DG Forearm Right  Result Date:  02/03/2019 CLINICAL DATA:  Right forearm pain, left hand pain, left shoulder pain and generalized weakness. EXAM: RIGHT FOREARM - 2 VIEW COMPARISON:  None FINDINGS: There is no evidence of fracture or other focal bone lesions. Soft tissues are unremarkable. IMPRESSION: Negative evaluation of the right forearm Electronically Signed   By: Zetta Bills M.D.   On: 02/03/2019 19:39   CT Head Wo Contrast  Result Date: 02/03/2019 CLINICAL DATA:  Weakness and falls over last 3 days, history of renal cell carcinoma EXAM: CT HEAD WITHOUT CONTRAST TECHNIQUE: Contiguous axial images were obtained from the base of the skull through the vertex without intravenous contrast. COMPARISON:  None. FINDINGS: Brain: There is a round hemorrhagic mass seen within the posterior right cerebellum measuring 2.8 cm. The mass a significant surrounding edema causes mass effect upon the posterior fourth ventricle and left cerebellum. There is also edema extending into the left cerebellum. There is dilatation the ventricles and sulci consistent with age-related atrophy. Low-attenuation changes in the deep white matter consistent with small vessel ischemia. Vascular: No hyperdense vessel or unexpected calcification. Skull: The skull is intact. No fracture or focal lesion identified. Sinuses/Orbits: The visualized paranasal sinuses and mastoid air cells are clear. The orbits and globes intact. Other: None  IMPRESSION: Hemorrhagic 2.8 cm mass within the right cerebellum with surrounding vasogenic edema causing mass effect upon the fourth ventricle and left cerebellum. There is dilatation the ventricles and sulci consistent with age-related atrophy. Low-attenuation changes in the deep white matter consistent with small vessel ischemia. These results were called by telephone at the time of interpretation on 02/03/2019 at 8:46 pm to provider [facility] , who verbally acknowledged these results. Electronically Signed   By: Prudencio Pair  M.D.   On: 02/03/2019 20:54   CT Chest W Contrast  Result Date: 02/03/2019 CLINICAL DATA:  Increased weakness and falls for the last 3 days EXAM: CT CHEST, ABDOMEN, AND PELVIS WITH CONTRAST TECHNIQUE: Multidetector CT imaging of the chest, abdomen and pelvis was performed following the standard protocol during bolus administration of intravenous contrast. CONTRAST:  164mL OMNIPAQUE IOHEXOL 350 MG/ML SOLN COMPARISON:  CT chest, abdomen and pelvis 12/02/2015 FINDINGS: CT CHEST FINDINGS Cardiovascular: The aorta is normal caliber. No dissection flap or other acute luminal abnormality of the aorta is seen. No periaortic stranding or hemorrhage. Atherosclerotic plaque within the normal caliber aorta. Normal 3 vessel branching of the arch. Central pulmonary arteries are normal caliber. No large central filling defects on this non tailored examination. Normal heart size. No pericardial effusion. Mediastinum/Nodes: No mediastinal fluid or hemorrhage. Physiologic amount of fluid present within the pericardial recesses. There is mediastinal and right hilar adenopathy. Findings include a centrally hypoattenuating 1.9 cm subcarinal nodule (4/27) and a 1.4 cm right hilar node with central hypoattenuation (4/27). There is a borderline enlarged right axillary node measuring 10 mm (4/32). Few nodular soft tissue densities in the upper-outer quadrant of the right breast with a more focal cutaneous based lesion in the lower inner quadrant measuring up to 2 cm in size. Lungs/Pleura: There is a multilobulated mass lesion abutting the pleura with acute angular margins in the medial aspect of the right middle lobe. This measures up to 3 cm in size (6/87). Smaller adjacent 9 mm satellite nodule is noted (6/76 few additional satellite nodules are noted adjacent the right hilar vessels and bronchi. Subpleural 5 mm nodule noted in the lateral left upper lobe (6/64). No acute traumatic abnormality of the lung parenchyma. No  consolidation, features of edema, pneumothorax, or effusion. Musculoskeletal: Dedicated thoracic spine reconstructions were generated, please see that report for further details of the spine. No visible displaced rib fractures. No suspicious osseous lesions in the chest CT ABDOMEN PELVIS FINDINGS Hepatobiliary: No direct hepatic injury or perihepatic hematoma. Subcentimeter hypoattenuating focus in the posterior right lobe liver (3/2) is too small to fully characterize on CT imaging. No focal liver abnormality is seen. No gallstones, gallbladder wall thickening, or biliary dilatation. Pancreas: Unremarkable. No pancreatic ductal dilatation or surrounding inflammatory changes. Spleen: Normal in size without focal abnormality. Adrenals/Urinary Tract: Irregular centrally hypoattenuating 3.3 cm lesion in the left adrenal gland. Additional hypertense 1.6 cm nodule in the lateral limb right adrenal gland. No convincing adrenal hemorrhage is seen however patient appears to be post left nephrectomy with some surgical material in the left nephrectomy bed. No right renal injury or perirenal hemorrhage. No suspicious renal lesion. Normal enhancement and excretion without extravasation of contrast from the collecting system on urinary phase delays. Mild bladder wall thickening. Faint perivesicular haze. No direct bladder injury. Stomach/Bowel: Distal esophagus, stomach and duodenal sweep are unremarkable. No small bowel wall thickening or dilatation. No evidence of obstruction. A normal appendix is visualized. No colonic dilatation or wall thickening. No sites of  mesenteric hematoma or contusion. Vascular/Lymphatic: No direct vascular injury in the abdomen or pelvis. Few prominent retroperitoneal nodes are noted including a 8 mm periaortic lymph node (4/72). Reproductive: Patient is post hysterectomy. No concerning adnexal lesions. Postsurgical changes noted in the low anterior abdomen Other: Mild body wall edema. No large body  wall hematoma. No bowel containing hernia. No traumatic abdominal wall injury or dehiscence. Musculoskeletal: Dedicated lumbar spine reconstructions were generated, please see that report for further details. No abnormalities of the bony pelvis. No intramuscular hematoma or other acute muscular abnormality is seen. Slight age related atrophy is noted. IMPRESSION: 1. Compression deformities at T12 and L3, better detailed dedicated thoracolumbar spine reconstructions. Please see that report for further details. 2. No other evidence of acute traumatic injury to the chest, abdomen or pelvis. 3. Lobulated mass lesion in the medial aspect of the right middle lobe with adjacent satellite nodules and mediastinal and right hilar adenopathy. 4. Soft tissue nodule in the lower inner quadrant of the right breast with additional nodularity in the upper-outer quadrant of the same breast. Should correlate with mammography and dedicated breast imaging. 5. Bilateral adrenal masses concerning for metastatic disease. 6. Subcentimeter hypoattenuating focus in the right lobe liver as well as some prominent though nonenlarged lymph nodes, while these are typically characterized as benign incidental findings, should be viewed with some suspicion given the findings above. 7. Mild bladder wall thickening with subtle perivesicular haze, which may represent cystitis. 8. Prior left nephrectomy. 9.  Aortic Atherosclerosis (ICD10-I70.0). These results were called by telephone at the time of interpretation on 02/03/2019 at 9:12 pm to provider [facility] , who verbally acknowledged these results. Electronically Signed   By: Lovena Le M.D.   On: 02/03/2019 21:14   CT Cervical Spine Wo Contrast  Result Date: 02/03/2019 CLINICAL DATA:  Fall. EXAM: CT CERVICAL SPINE WITHOUT CONTRAST TECHNIQUE: Multidetector CT imaging of the cervical spine was performed without intravenous contrast. Multiplanar CT image reconstructions were also  generated. COMPARISON:  MRI cervical spine 12/27/2011 FINDINGS: Alignment: Normal Skull base and vertebrae: Negative for fracture Soft tissues and spinal canal: Negative for soft tissue mass or swelling. Prior thyroidectomy. Disc levels: Disc degeneration and spurring multiple levels. Bilateral foraminal stenosis at C3-4, C4-5, C5-6, C6-7 due to spurring. Upper chest: Lung apices clear bilaterally. Other: There is mass-effect on the posterior fossa with effacement of the fourth ventricle and obstructive hydrocephalus. There is question of high-density subdural blood in the right posterior fossa. This area is not well evaluated due to streak artifact. Recommend stat head CT. IMPRESSION: Abnormal mass-effect in the posterior fossa with obstructive hydrocephalus. Question subdural hematoma or mass in the posterior fossa on the right. Recommend stat head CT. Cervical spondylosis without fracture These results were called by telephone at the time of interpretation on 02/03/2019 at 8:49 pm to provider [facility] , who verbally acknowledged these results. Electronically Signed   By: Franchot Gallo M.D.   On: 02/03/2019 20:50   MR BRAIN W WO CONTRAST  Result Date: 02/05/2019 CLINICAL DATA:  Post craniotomy EXAM: MRI HEAD WITHOUT AND WITH CONTRAST TECHNIQUE: Multiplanar, multiecho pulse sequences of the brain and surrounding structures were obtained without and with intravenous contrast. CONTRAST:  95mL GADAVIST GADOBUTROL 1 MMOL/ML IV SOLN COMPARISON:  Post craniotomy FINDINGS: Brain: There are new postoperative changes of right suboccipital craniotomy for resection of dominant right cerebellar mass. Resection cavity contains air, fluid, and blood products with minimal, likely postsurgical enhancement at the margins. Mild reduced  diffusion at the cavity margins likely reflects postoperative contusion. Surrounding edema remains similar. There is persistent mass-effect on the fourth ventricle with resulting  similar hydrocephalus. Additional enhancing lesions and associated edema are stable over the short interval. Foci of T2 hyperintensity in the supratentorial white matter likely reflect a combination chronic microvascular ischemic changes and is digital edema related to hydrocephalus. There are scattered extra-axial foci of susceptibility likely reflecting pneumocephalus. Vascular: Major vessel flow voids at the skull base are preserved. Skull and upper cervical spine: There is abnormal T1 marrow signal eccentric to the right at C3 and possibly C4. Sinuses/Orbits: Paranasal sinuses are aerated. Orbits are unremarkable. Other: Sella is unremarkable.  Mastoid air cells are clear. 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 Associated mass effect and hydrocephalus are similar. Stable additional metastatic lesions. Abnormal marrow signal the upper cervical spine suspicious for metastatic disease. Electronically Signed   By: Macy Mis M.D.   On: 02/05/2019 08:35   MR BRAIN W WO CONTRAST  Result Date: 02/04/2019 CLINICAL DATA:  Occurrence falls and weakness. History of multiple carcinomas. EXAM: MRI HEAD WITHOUT AND WITH CONTRAST TECHNIQUE: Multiplanar, multiecho pulse sequences of the brain and surrounding structures were obtained without and with intravenous contrast. CONTRAST:  40mL GADAVIST GADOBUTROL 1 MMOL/ML IV SOLN COMPARISON:  Head CT 02/03/2019 FINDINGS: Brain: There is a lesion of the right cerebellar hemisphere with nodular peripheral contrast enhancement, measuring 2.8 x 2.6 cm. There is associated hemorrhage is demonstrated on the earlier head CT. This causes moderate edema in the right cerebellar hemisphere with mass effect on the fourth ventricle. There is also a lesion of the superior cerebellar vermis that measures 1.5 x 1.4 cm. There are subcentimeter lesions of the left deep gray nuclei, left occipital lobe, left parietal white  matter and left cerebellum. Early confluent hyperintense T2-weighted signal of the periventricular and deep white matter, most commonly due to chronic ischemic microangiopathy. Advanced atrophy for age. Blood-sensitive sequences show a single focus of chronic microhemorrhage in the right frontal white matter. The midline structures are normal. Vascular: Normal flow voids. Skull and upper cervical spine: Normal marrow signal. Sinuses/Orbits: Negative. Other: None. IMPRESSION: 1. Seven intracranial metastatic lesions, the largest of which is in the right cerebellar hemisphere and measures up to 2.6 cm with associated hemorrhage and moderate mass effect on the fourth ventricle. 2. Advanced atrophy and chronic ischemic microangiopathy. Lesions are annotated on series 11, axial postcontrast T1-weighted imaging. Electronically Signed   By: Ulyses Jarred M.D.   On: 02/04/2019 01:28   CT ABDOMEN PELVIS W CONTRAST  Result Date: 02/03/2019 CLINICAL DATA:  Increased weakness and falls for the last 3 days EXAM: CT CHEST, ABDOMEN, AND PELVIS WITH CONTRAST TECHNIQUE: Multidetector CT imaging of the chest, abdomen and pelvis was performed following the standard protocol during bolus administration of intravenous contrast. CONTRAST:  155mL OMNIPAQUE IOHEXOL 350 MG/ML SOLN COMPARISON:  CT chest, abdomen and pelvis 12/02/2015 FINDINGS: CT CHEST FINDINGS Cardiovascular: The aorta is normal caliber. No dissection flap or other acute luminal abnormality of the aorta is seen. No periaortic stranding or hemorrhage. Atherosclerotic plaque within the normal caliber aorta. Normal 3 vessel branching of the arch. Central pulmonary arteries are normal caliber. No large central filling defects on this non tailored examination. Normal heart size. No pericardial effusion. Mediastinum/Nodes: No mediastinal fluid or hemorrhage. Physiologic amount of fluid present within the pericardial recesses. There is mediastinal and right hilar adenopathy.  Findings include a centrally hypoattenuating 1.9  cm subcarinal nodule (4/27) and a 1.4 cm right hilar node with central hypoattenuation (4/27). There is a borderline enlarged right axillary node measuring 10 mm (4/32). Few nodular soft tissue densities in the upper-outer quadrant of the right breast with a more focal cutaneous based lesion in the lower inner quadrant measuring up to 2 cm in size. Lungs/Pleura: There is a multilobulated mass lesion abutting the pleura with acute angular margins in the medial aspect of the right middle lobe. This measures up to 3 cm in size (6/87). Smaller adjacent 9 mm satellite nodule is noted (6/76 few additional satellite nodules are noted adjacent the right hilar vessels and bronchi. Subpleural 5 mm nodule noted in the lateral left upper lobe (6/64). No acute traumatic abnormality of the lung parenchyma. No consolidation, features of edema, pneumothorax, or effusion. Musculoskeletal: Dedicated thoracic spine reconstructions were generated, please see that report for further details of the spine. No visible displaced rib fractures. No suspicious osseous lesions in the chest CT ABDOMEN PELVIS FINDINGS Hepatobiliary: No direct hepatic injury or perihepatic hematoma. Subcentimeter hypoattenuating focus in the posterior right lobe liver (3/2) is too small to fully characterize on CT imaging. No focal liver abnormality is seen. No gallstones, gallbladder wall thickening, or biliary dilatation. Pancreas: Unremarkable. No pancreatic ductal dilatation or surrounding inflammatory changes. Spleen: Normal in size without focal abnormality. Adrenals/Urinary Tract: Irregular centrally hypoattenuating 3.3 cm lesion in the left adrenal gland. Additional hypertense 1.6 cm nodule in the lateral limb right adrenal gland. No convincing adrenal hemorrhage is seen however patient appears to be post left nephrectomy with some surgical material in the left nephrectomy bed. No right renal injury or  perirenal hemorrhage. No suspicious renal lesion. Normal enhancement and excretion without extravasation of contrast from the collecting system on urinary phase delays. Mild bladder wall thickening. Faint perivesicular haze. No direct bladder injury. Stomach/Bowel: Distal esophagus, stomach and duodenal sweep are unremarkable. No small bowel wall thickening or dilatation. No evidence of obstruction. A normal appendix is visualized. No colonic dilatation or wall thickening. No sites of mesenteric hematoma or contusion. Vascular/Lymphatic: No direct vascular injury in the abdomen or pelvis. Few prominent retroperitoneal nodes are noted including a 8 mm periaortic lymph node (4/72). Reproductive: Patient is post hysterectomy. No concerning adnexal lesions. Postsurgical changes noted in the low anterior abdomen Other: Mild body wall edema. No large body wall hematoma. No bowel containing hernia. No traumatic abdominal wall injury or dehiscence. Musculoskeletal: Dedicated lumbar spine reconstructions were generated, please see that report for further details. No abnormalities of the bony pelvis. No intramuscular hematoma or other acute muscular abnormality is seen. Slight age related atrophy is noted. IMPRESSION: 1. Compression deformities at T12 and L3, better detailed dedicated thoracolumbar spine reconstructions. Please see that report for further details. 2. No other evidence of acute traumatic injury to the chest, abdomen or pelvis. 3. Lobulated mass lesion in the medial aspect of the right middle lobe with adjacent satellite nodules and mediastinal and right hilar adenopathy. 4. Soft tissue nodule in the lower inner quadrant of the right breast with additional nodularity in the upper-outer quadrant of the same breast. Should correlate with mammography and dedicated breast imaging. 5. Bilateral adrenal masses concerning for metastatic disease. 6. Subcentimeter hypoattenuating focus in the right lobe liver as well as  some prominent though nonenlarged lymph nodes, while these are typically characterized as benign incidental findings, should be viewed with some suspicion given the findings above. 7. Mild bladder wall thickening with subtle perivesicular haze,  which may represent cystitis. 8. Prior left nephrectomy. 9.  Aortic Atherosclerosis (ICD10-I70.0). These results were called by telephone at the time of interpretation on 02/03/2019 at 9:12 pm to provider [facility] , who verbally acknowledged these results. Electronically Signed   By: Lovena Le M.D.   On: 02/03/2019 21:14   CT T-SPINE NO CHARGE  Result Date: 02/03/2019 CLINICAL DATA:  Increasing weakness and multiple falls for 3 days EXAM: CT THORACIC AND LUMBAR SPINE WITHOUT CONTRAST TECHNIQUE: Multiplanar CT images of the thoracic and lumbar spine were reconstructed from contemporary CT of the Chest, abdomen and pelvis. CONTRAST:  None or No additional COMPARISON:  Chest radiograph 02/03/2019 CT abdomen pelvis 05/04/2015, chest radiograph 05/02/2017, lumbar radiographs 10/13/2018 FINDINGS: THORACIC SPINE: Alignment: There is straightening of the normal thoracic kyphosis throughout the lower thoracic levels with focal kyphotic curvature at the level of the T12 compression deformity. Mild dextrocurvature throughout the thoracic spine with an apex at the T11 level. No traumatic listhesis. No abnormal widened, jumped or perched facets. Vertebrae: There is multilevel Schmorl's node formations. Anterior wedging compression deformity of T12 (AO spine A1) with approximately 50% height loss anteriorly some persistent lucency suggesting acuity. No other acute osseous injury or compression deformity is seen in the thoracic spine. No suspicious osseous lesions. Paraspinal and other soft tissues: Small amount paravertebral soft tissue thickening/stranding adjacent the T12 compression deformity. Finding favors acuity. For findings within the posterior chest and  mediastinum, see dedicated CT from which this study is reconstructed. Disc levels: There is diffuse multilevel intervertebral disc height loss with discogenic endplate changes and some posterior disc osteophyte formation at T4-T5, T5-T6 and T7-T8 but without significant resulting canal stenosis or neural foraminal narrowing in the imaged thoracic spine. LUMBAR SPINE: Segmentation: 5 non-rib-bearing lumbar type vertebral levels. Alignment: There is slight levocurvature of the lumbar spine with an apex at L3. No abnormally widened, jumped or perched facets. Vertebrae: There is a split/pincer type compression deformity of L3 (AO spine A2). Central height loss of approximately 50%. No abnormal posterior wall involvement or retropulsion of fracture fragments. No additional acute fracture is seen. Posterior elements are intact. No suspicious osseous lesions. Multilevel Schmorl's node formations seen in the lumbar spine. Paraspinal and other soft tissues: Small amount of paravertebral soft tissue thickening at the L3 level suggests acuity. No visible canal hematoma. For findings in the posterior abdomen and pelvis, see dedicated CT from which this study is reconstructed. Disc levels: Multilevel intervertebral disc height loss throughout the lumbar spine with multilevel global disc bulges. Central disc protrusions at L2 and L3 result in mild canal stenosis. A left central disc protrusion at L1 results in more moderate canal narrowing. Facet hypertrophic changes are maximal at L5-S1 with resulting mild to moderate multilevel neural foraminal narrowing the lumbar spine. IMPRESSION: 1. Anterior wedging compression deformity T12 with 50% height loss anteriorly (AOSpineA1). 2. Split/Pincer type compression deformity of L3 with approximately 50% height loss anteriorly (AOSpine A2). 3. Faint paraspinal soft tissue thickening adjacent both these deformities suggest acuity. 4. No other acute osseous injuries in the thoracic or  lumbar spine. 5. Multilevel degenerative changes of the thoracic and lumbar spine, as described above. 6. For findings in the posterior chest, abdomen and pelvis, see dedicated CT from which this study is reconstructed. Electronically Signed   By: Lovena Le M.D.   On: 02/03/2019 20:56   CT L-SPINE NO CHARGE  Result Date: 02/03/2019 CLINICAL DATA:  Increasing weakness and multiple falls for 3 days EXAM:  CT THORACIC AND LUMBAR SPINE WITHOUT CONTRAST TECHNIQUE: Multiplanar CT images of the thoracic and lumbar spine were reconstructed from contemporary CT of the Chest, abdomen and pelvis. CONTRAST:  None or No additional COMPARISON:  Chest radiograph 02/03/2019 CT abdomen pelvis 05/04/2015, chest radiograph 05/02/2017, lumbar radiographs 10/13/2018 FINDINGS: THORACIC SPINE: Alignment: There is straightening of the normal thoracic kyphosis throughout the lower thoracic levels with focal kyphotic curvature at the level of the T12 compression deformity. Mild dextrocurvature throughout the thoracic spine with an apex at the T11 level. No traumatic listhesis. No abnormal widened, jumped or perched facets. Vertebrae: There is multilevel Schmorl's node formations. Anterior wedging compression deformity of T12 (AO spine A1) with approximately 50% height loss anteriorly some persistent lucency suggesting acuity. No other acute osseous injury or compression deformity is seen in the thoracic spine. No suspicious osseous lesions. Paraspinal and other soft tissues: Small amount paravertebral soft tissue thickening/stranding adjacent the T12 compression deformity. Finding favors acuity. For findings within the posterior chest and mediastinum, see dedicated CT from which this study is reconstructed. Disc levels: There is diffuse multilevel intervertebral disc height loss with discogenic endplate changes and some posterior disc osteophyte formation at T4-T5, T5-T6 and T7-T8 but without significant resulting canal stenosis or  neural foraminal narrowing in the imaged thoracic spine. LUMBAR SPINE: Segmentation: 5 non-rib-bearing lumbar type vertebral levels. Alignment: There is slight levocurvature of the lumbar spine with an apex at L3. No abnormally widened, jumped or perched facets. Vertebrae: There is a split/pincer type compression deformity of L3 (AO spine A2). Central height loss of approximately 50%. No abnormal posterior wall involvement or retropulsion of fracture fragments. No additional acute fracture is seen. Posterior elements are intact. No suspicious osseous lesions. Multilevel Schmorl's node formations seen in the lumbar spine. Paraspinal and other soft tissues: Small amount of paravertebral soft tissue thickening at the L3 level suggests acuity. No visible canal hematoma. For findings in the posterior abdomen and pelvis, see dedicated CT from which this study is reconstructed. Disc levels: Multilevel intervertebral disc height loss throughout the lumbar spine with multilevel global disc bulges. Central disc protrusions at L2 and L3 result in mild canal stenosis. A left central disc protrusion at L1 results in more moderate canal narrowing. Facet hypertrophic changes are maximal at L5-S1 with resulting mild to moderate multilevel neural foraminal narrowing the lumbar spine. IMPRESSION: 1. Anterior wedging compression deformity T12 with 50% height loss anteriorly (AOSpineA1). 2. Split/Pincer type compression deformity of L3 with approximately 50% height loss anteriorly (AOSpine A2). 3. Faint paraspinal soft tissue thickening adjacent both these deformities suggest acuity. 4. No other acute osseous injuries in the thoracic or lumbar spine. 5. Multilevel degenerative changes of the thoracic and lumbar spine, as described above. 6. For findings in the posterior chest, abdomen and pelvis, see dedicated CT from which this study is reconstructed. Electronically Signed   By: Lovena Le M.D.   On: 02/03/2019 20:56   DG Shoulder  Left  Result Date: 02/03/2019 CLINICAL DATA:  Shoulder pain EXAM: LEFT SHOULDER - 2+ VIEW COMPARISON:  None FINDINGS: There is no evidence of fracture or dislocation. There is no evidence of arthropathy or other focal bone abnormality. Soft tissues are unremarkable. IMPRESSION: Negative evaluation of the left shoulder. Electronically Signed   By: Zetta Bills M.D.   On: 02/03/2019 19:43   DG Hand Complete Left  Result Date: 02/03/2019 CLINICAL DATA:  Hand pain EXAM: LEFT HAND - COMPLETE 3+ VIEW COMPARISON:  None FINDINGS: Degenerative changes at  the first carpometacarpal joint. No signs of fracture or dislocation. Soft tissues are unremarkable. IMPRESSION: Degenerative changes without fracture. Electronically Signed   By: Zetta Bills M.D.   On: 02/03/2019 19:42    Assessment/Plan: First postoperative day following posterior fossa craniotomy for metastasis.  Postoperative MRI looks good.  Potassium 3.5 this morning, will recheck in a.m.  Continuing dexamethasone 4 mg IV every 6 hours.  Nursing staff asked about TLSO because of T12 and L3 fractures.  The explained that it had been by Dr. Kathyrn Sheriff, but no orders are placed.  I have recommended a Vertalign TLSO, which is to be used when she is mobilized out of bed.  Hosie Spangle, MD 02/05/2019, 9:23 AM

## 2019-02-05 NOTE — Progress Notes (Signed)
Orthopedic Tech Progress Note Patient Details:  Krista Hodges May 28, 1955 [id number] Called in order to [employer] for a TLSO VERTALIGN brace Patient ID: Krista Hodges, female   DOB: 1955-05-17, 63 y.o.   MRN: [id number]   [name] 02/05/2019, 9:36 AM

## 2019-02-05 NOTE — Progress Notes (Addendum)
PT Cancellation Note  Patient Details Name: Krista Hodges MRN: [id number] DOB: Aug 23, 1955   Cancelled Treatment:    Reason Eval/Treat Not Completed: Patient not medically ready(notes per NS for TLSO for mobility however no brace present or ordered as of yet) 0744  1130 continue to await brace   Krista Hodges 02/05/2019, 7:44 AM  Bayard Males, PT Acute Rehabilitation Services Pager: [phone] Office: [phone]

## 2019-02-05 NOTE — Evaluation (Signed)
Physical Therapy Evaluation Patient Details Name: Krista Hodges MRN: [id number] DOB: 1956/02/07 Today's Date: 02/05/2019   History of Present Illness  63 yo admitted with falls and weakness found to have T12 and L3 fx to be treated with TLSO for OOB and cerebellar mass s/p craniotomy with MRI also demonstrating multiple breast, lung, adrenal and possible liver mass. PMHx: HTN, depression, HLD and multiple cancers including ?RCC s/p nephrectomy around 2015, endometrial adenocarcinoma 2017, papillary thyroid carcinoma 2019 s/p radiation. Pt also recently diagnosed with dementia.    Clinical Impression  Pt caddy corner in bed on arrival in TLSO with eyes closed. Pt able to open eyes on command but tends to close immediately without prompting. Sitting EOB with eyes open pt with upper right gaze with increased time and cues to even begin to find visual target then reported diplopia with improvement with left eye occluded. Pt with decreased cognition with daughter in law present to provide history and PLOF. Pt with decreased strength, functional mobility, balance and adherence to precautions who will benefit from acute therapy to maximize mobility safety and function. Pt lives in a in law suite at son's house and her 24hr assist is elderly spouse. Pt would benefit from return home due to dementia with additional assist if 24hr program available otherwise SNF recommended unless pt able to achieve at least min assist for mobility.     Follow Up Recommendations Home health PT;Supervision/Assistance - 24 hour;SNF(pt would benefit from [facility] first program due to dementia and familiar environment. If this isn't possible then SNF)    Equipment Recommendations  3in1 (PT)    Recommendations for Other Services OT consult     Precautions / Restrictions Precautions Precautions: Fall;Back Precaution Booklet Issued: Yes (comment) Precaution Comments: art line, diplopia Required Braces or Orthoses:  Spinal Brace Spinal Brace: Thoracolumbosacral orthotic;Applied in sitting position      Mobility  Bed Mobility Overal bed mobility: Needs Assistance Bed Mobility: Rolling;Sidelying to Sit Rolling: Mod assist Sidelying to sit: Mod assist       General bed mobility comments: cues and physical assist to roll toward right with assist of pad at pelvis to complete roll. Assist to bring legs off of bed and elevate trunk from surface  Transfers Overall transfer level: Needs assistance   Transfers: Sit to/from Stand;Stand Pivot Transfers Sit to Stand: Mod assist Stand pivot transfers: Mod assist       General transfer comment: mod assist to rise from bed with bil knees blocked with face to face technique and pt grasping therapist arms. Pivot toward right with sequential cues/ assist for weight shift and increased time to process and step with RLE. Decreased control of descent. Min assist to scoot back in chair and pillow wedge to position in midline as pt with tendency for left lean  Ambulation/Gait             General Gait Details: unsafe to attempt without +2 assist  Stairs            Wheelchair Mobility    Modified Rankin (Stroke Patients Only)       Balance Overall balance assessment: Needs assistance;History of Falls Sitting-balance support: Bilateral upper extremity supported;Feet supported Sitting balance-Leahy Scale: Fair Sitting balance - Comments: minguard sitting EOb   Standing balance support: Bilateral upper extremity supported Standing balance-Leahy Scale: Poor Standing balance comment: physical assist to maintain standing  Pertinent Vitals/Pain Pain Assessment: 0-10 Pain Score: 6  Pain Location: back Pain Descriptors / Indicators: Aching;Guarding Pain Intervention(s): Limited activity within patient's tolerance;Monitored during session;Repositioned    Home Living Family/patient expects to be discharged  to:: Private residence Living Arrangements: Spouse/significant other Available Help at Discharge: Family;Available 24 hours/day Type of Home: Apartment(in law suite at son's house) Home Access: Level entry     Home Layout: One level Home Equipment: Wheelchair - [employer] - 2 wheels;Cane - single point;Grab bars - tub/shower      Prior Function Level of Independence: Needs assistance   Gait / Transfers Assistance Needed: was walking with assist grossly 20' then unable to walk for 2 weeks PTA  ADL's / Homemaking Assistance Needed: bathing and dressing on her own until 2 weeks PtA and since that time assist for all        Hand Dominance        Extremity/Trunk Assessment   Upper Extremity Assessment Upper Extremity Assessment: Generalized weakness    Lower Extremity Assessment Lower Extremity Assessment: Generalized weakness(RLE weaker than LLE)    Cervical / Trunk Assessment Cervical / Trunk Assessment: Kyphotic;Other exceptions Cervical / Trunk Exceptions: post surgical with TLSO  Communication      Cognition Arousal/Alertness: Lethargic Behavior During Therapy: Flat affect Overall Cognitive Status: Impaired/Different from baseline Area of Impairment: Memory;Following commands;Safety/judgement;Problem solving;Orientation                 Orientation Level: Situation   Memory: Decreased short-term memory Following Commands: Follows one step commands with increased time Safety/Judgement: Decreased awareness of safety;Decreased awareness of deficits   Problem Solving: Slow processing;Decreased initiation;Difficulty sequencing;Requires verbal cues;Requires tactile cues General Comments: daughter-in-law present and reports cognition appears near normal with some days better than others      General Comments      Exercises     Assessment/Plan    PT Assessment Patient needs continued PT services  PT Problem List Decreased strength;Decreased  mobility;Decreased safety awareness;Decreased activity tolerance;Decreased cognition;Decreased balance;Decreased knowledge of use of DME;Pain;Decreased knowledge of precautions       PT Treatment Interventions Gait training;Balance training;Functional mobility training;Cognitive remediation;Therapeutic activities;Patient/family education;DME instruction;Therapeutic exercise;Wheelchair mobility training    PT Goals (Current goals can be found in the Care Plan section)  Acute Rehab PT Goals Patient Stated Goal: return home PT Goal Formulation: With patient/family Time For Goal Achievement: 03/19/19 Potential to Achieve Goals: Fair    Frequency Min 4X/week   Barriers to discharge Decreased caregiver support pt lives with spouse who cannot provide mod physical assist and other family works    Co-evaluation               AM-PAC PT "6 Clicks" Mobility  Outcome Measure Help needed turning from your back to your side while in a flat bed without using bedrails?: A Lot Help needed moving from lying on your back to sitting on the side of a flat bed without using bedrails?: A Lot Help needed moving to and from a bed to a chair (including a wheelchair)?: A Lot Help needed standing up from a chair using your arms (e.g., wheelchair or bedside chair)?: A Lot Help needed to walk in hospital room?: Total Help needed climbing 3-5 steps with a railing? : Total 6 Click Score: 10    End of Session Equipment Utilized During Treatment: Gait belt;Back brace Activity Tolerance: Patient tolerated treatment well Patient left: in chair;with call bell/phone within reach;with chair alarm set;with nursing/sitter in room;with family/visitor present Nurse Communication:  Mobility status;Precautions PT Visit Diagnosis: Other abnormalities of gait and mobility (R26.89);History of falling (Z91.81);Unsteadiness on feet (R26.81)    Time: [id number] PT Time Calculation (min) (ACUTE ONLY): 30 min   Charges:    PT Evaluation $PT Eval Moderate Complexity: 1 Mod PT Treatments $Therapeutic Activity: 8-22 mins        Noelie Renfrow P, PT Acute Rehabilitation Services Pager: [phone] Office: [phone]   Remo Kirschenmann B Sofhia Ulibarri 02/05/2019, 1:44 PM

## 2019-02-05 NOTE — Progress Notes (Signed)
PROGRESS NOTE    Krista Hodges  [id number] DOB: March 17, 1955 DOA: 02/03/2019 PCP: Josetta Huddle, MD   Brief Narrative: Krista Hodges is a 63 y.o. 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 Patient presented secondary to worsening confusion and found to have a cerebellar mass with vasogenic edema with concern for metastatic disease.   Assessment & Plan:   Active Problems:   Essential (primary) hypertension   Brain tumor ([place])   Hypokalemia   Hypothyroidism   Cerebellar mass with vasogenic edema Neurosurgery consulted and started decadron. Plan for surgery. CT scan of head/chest/abdomen/pelvis concerning for multiple areas of metastatic disease. S/p suboccipital craniectomy -Neurosurgery recommendations  Essential hypertension Well controlled -Continue irbesartan  Hypokalemia Given supplementation with improvement. Magnesium normal.  Anemia Appears to be acute. Unsure if any source of bleeding at this time -Daily CBC  Hyperlipidemia -Continue Crestor  Hypothyroidism TSH of 75.67. On Synthroid 50 mcg as an outpatient. Started on Synthroid 88 mcg inpatient -Will need to clarify if patient has been taking medication; for now, continue Synthroid  Depression -Continue Zoloft and Wellbutrin  Cognitive impairment -Continue Aricept and Namenda  Compression fractures T12 and L3 noted incidentally. TLSO brace ordered  History of papillary thyroid carcinoma/endometrial carcinoma/renal cell carcinoma  Follows at [facility]  Hyperglycemia Hemoglobin A1C of 5.6.   DVT prophylaxis: SCDs Code Status:   Code Status: Full Code Family Communication: None Disposition Plan: Discharge per primary    Procedures:   None  Antimicrobials:  None    Subjective: No issues  overnight  Objective: Vitals:   02/05/19 0708 02/05/19 0800 02/05/19 1200 02/05/19 1209  BP: 110/67   118/61  Pulse:    (!) 43  Resp:    15  Temp:  98.6 F (37 C) 97.9 F (36.6 C)   TempSrc:  Axillary Oral   SpO2:    97%  Weight:      Height:        Intake/Output Summary (Last 24 hours) at 02/05/2019 1527 Last data filed at 02/05/2019 0700 Gross per 24 hour  Intake 1900.52 ml  Output 210 ml  Net 1690.52 ml   Filed Weights   02/04/19 1152  Weight: 65 kg    Examination:  General exam: Appears calm and comfortable Respiratory system: Clear to auscultation. Respiratory effort normal. Cardiovascular system: S1 & S2 heard, RRR. 1/6 systolic murmur Gastrointestinal system: Abdomen is nondistended, soft and nontender. No organomegaly or masses felt. Normal bowel sounds heard. Central nervous system: Alert and oriented. No focal neurological deficits. Extremities: No edema. No calf tenderness Skin: No cyanosis. No rashes Psychiatry: Judgement and insight appear normal. Mood & affect appropriate.   Data Reviewed: I have personally reviewed following labs and imaging studies  CBC: Recent Labs  Lab 02/03/19 1827 02/04/19 1244 02/04/19 1251 02/04/19 1307 02/05/19 0653  WBC 13.0*  --   --  10.5 15.3*  NEUTROABS 11.9*  --   --   --   --   HGB 11.0* 7.8* 8.5* 9.2* 9.2*  HCT 34.4* 23.0* 25.0* 28.6* 28.7*  MCV 96.1  --   --  95.3 95.3  PLT 332  --   --  309 0000000   Basic Metabolic Panel: Recent Labs  Lab 02/03/19 1827 02/04/19 0342 02/04/19 1244 02/04/19 1251 02/05/19 0653  NA 135 135 139 138 138  K 2.7* 3.4* 3.0* 3.3* 3.5  CL  98 100  --   --  108  CO2 26 25  --   --  21*  GLUCOSE 154* 130*  --   --  115*  BUN 18 16  --   --  16  CREATININE 1.04* 0.92  --   --  0.87  CALCIUM 9.5 9.6  --   --  9.5  MG  --  1.8  --   --   --    GFR: Estimated Creatinine Clearance: 60.4 mL/min (by C-G formula based on SCr of 0.87 mg/dL). Liver Function Tests: Recent Labs  Lab  02/03/19 1827  AST 16  ALT 12  ALKPHOS 103  BILITOT 0.5  PROT 6.3*  ALBUMIN 3.2*   No results for input(s): LIPASE, AMYLASE in the last 168 hours. No results for input(s): AMMONIA in the last 168 hours. Coagulation Profile: Recent Labs  Lab 02/03/19 2151  INR 1.0   Cardiac Enzymes: Recent Labs  Lab 02/03/19 1827  CKTOTAL 46   BNP (last 3 results) No results for input(s): PROBNP in the last 8760 hours. HbA1C: Recent Labs    02/05/19 0653  HGBA1C 5.6   CBG: Recent Labs  Lab 02/03/19 1803  GLUCAP 165*   Lipid Profile: No results for input(s): CHOL, HDL, LDLCALC, TRIG, CHOLHDL, LDLDIRECT in the last 72 hours. Thyroid Function Tests: Recent Labs    02/03/19 1854  TSH 75.666*  FREET4 0.45*   Anemia Panel: No results for input(s): VITAMINB12, FOLATE, FERRITIN, TIBC, IRON, RETICCTPCT in the last 72 hours. Sepsis Labs: No results for input(s): PROCALCITON, LATICACIDVEN in the last 168 hours.  Recent Results (from the past 240 hour(s))  Respiratory Panel by RT PCR (Flu A&B, Covid) - Nasopharyngeal Swab     Status: None   Collection Time: 02/03/19  9:12 PM   Specimen: Nasopharyngeal Swab  Result Value Ref Range Status   SARS Coronavirus 2 by RT PCR NEGATIVE NEGATIVE Final    Comment: (NOTE) SARS-CoV-2 target nucleic acids are NOT DETECTED. The SARS-CoV-2 RNA is generally detectable in upper respiratoy specimens during the acute phase of infection. The lowest concentration of SARS-CoV-2 viral copies this assay can detect is 131 copies/mL. A negative result does not preclude SARS-Cov-2 infection and should not be used as the sole basis for treatment or other patient management decisions. A negative result may occur with  improper specimen collection/handling, submission of specimen other than nasopharyngeal swab, presence of viral mutation(s) within the areas targeted by this assay, and inadequate number of viral copies (<131 copies/mL). A negative result must  be combined with clinical observations, patient history, and epidemiological information. The expected result is Negative. Fact Sheet for Patients:  PinkCheek.be Fact Sheet for Healthcare Providers:  GravelBags.it This test is not yet ap proved or cleared by the [place] FDA and  has been authorized for detection and/or diagnosis of SARS-CoV-2 by FDA under an Emergency Use Authorization (EUA). This EUA will remain  in effect (meaning this test can be used) for the duration of the COVID-19 declaration under Section 564(b)(1) of the Act, 21 U.S.C. section 360bbb-3(b)(1), unless the authorization is terminated or revoked sooner.    Influenza A by PCR NEGATIVE NEGATIVE Final   Influenza B by PCR NEGATIVE NEGATIVE Final    Comment: (NOTE) The Xpert Xpress SARS-CoV-2/FLU/RSV assay is intended as an aid in  the diagnosis of influenza from Nasopharyngeal swab specimens and  should not be used as a sole basis for treatment. Nasal washings and  aspirates  are unacceptable for Xpert Xpress SARS-CoV-2/FLU/RSV  testing. Fact Sheet for Patients: PinkCheek.be Fact Sheet for Healthcare Providers: GravelBags.it This test is not yet approved or cleared by the [place] FDA and  has been authorized for detection and/or diagnosis of SARS-CoV-2 by  FDA under an Emergency Use Authorization (EUA). This EUA will remain  in effect (meaning this test can be used) for the duration of the  Covid-19 declaration under Section 564(b)(1) of the Act, 21  U.S.C. section 360bbb-3(b)(1), unless the authorization is  terminated or revoked. Performed at [facility], [place] [address] [place], [place] [address]   Culture, Urine     Status: Abnormal (Preliminary result)   Collection Time: 02/03/19 11:37 PM   Specimen: Urine, Random  Result Value Ref Range Status   Specimen Description  URINE, RANDOM  Final   Special Requests NONE  Final   Culture (A)  Final    >=100,000 COLONIES/mL ESCHERICHIA COLI SUSCEPTIBILITIES TO FOLLOW Performed at [facility] [address], [place], [place] [address]    Report Status PENDING  Incomplete  MRSA PCR Screening     Status: None   Collection Time: 02/04/19  8:06 PM   Specimen: Nasopharyngeal  Result Value Ref Range Status   MRSA by PCR NEGATIVE NEGATIVE Final    Comment:        The GeneXpert MRSA Assay (FDA approved for NASAL specimens only), is one component of a comprehensive MRSA colonization surveillance program. It is not intended to diagnose MRSA infection nor to guide or monitor treatment for MRSA infections. Performed at [facility], [place] [address], [place], [place] [address]          Radiology Studies: DG Chest 1 View  Result Date: 02/03/2019 CLINICAL DATA:  Generalized weakness after falling EXAM: CHEST  1 VIEW COMPARISON:  05/02/2017 FINDINGS: Cardiomediastinal contours are normal accounting for portable technique. Lungs are clear. Postoperative changes are noted in the low neck. Visualized skeletal structures are unremarkable. IMPRESSION: No acute cardiopulmonary disease. Electronically Signed   By: Zetta Bills M.D.   On: 02/03/2019 19:40   DG Forearm Right  Result Date: 02/03/2019 CLINICAL DATA:  Right forearm pain, left hand pain, left shoulder pain and generalized weakness. EXAM: RIGHT FOREARM - 2 VIEW COMPARISON:  None FINDINGS: There is no evidence of fracture or other focal bone lesions. Soft tissues are unremarkable. IMPRESSION: Negative evaluation of the right forearm Electronically Signed   By: Zetta Bills M.D.   On: 02/03/2019 19:39   CT Head Wo Contrast  Result Date: 02/03/2019 CLINICAL DATA:  Weakness and falls over last 3 days, history of renal cell carcinoma EXAM: CT HEAD WITHOUT CONTRAST TECHNIQUE: Contiguous axial images were obtained from the base of the skull through  the vertex without intravenous contrast. COMPARISON:  None. FINDINGS: Brain: There is a round hemorrhagic mass seen within the posterior right cerebellum measuring 2.8 cm. The mass a significant surrounding edema causes mass effect upon the posterior fourth ventricle and left cerebellum. There is also edema extending into the left cerebellum. There is dilatation the ventricles and sulci consistent with age-related atrophy. Low-attenuation changes in the deep white matter consistent with small vessel ischemia. Vascular: No hyperdense vessel or unexpected calcification. Skull: The skull is intact. No fracture or focal lesion identified. Sinuses/Orbits: The visualized paranasal sinuses and mastoid air cells are clear. The orbits and globes intact. Other: None IMPRESSION: Hemorrhagic 2.8 cm mass within the right cerebellum with surrounding vasogenic edema  causing mass effect upon the fourth ventricle and left cerebellum. There is dilatation the ventricles and sulci consistent with age-related atrophy. Low-attenuation changes in the deep white matter consistent with small vessel ischemia. These results were called by telephone at the time of interpretation on 02/03/2019 at 8:46 pm to provider [facility] , who verbally acknowledged these results. Electronically Signed   By: Prudencio Pair M.D.   On: 02/03/2019 20:54   CT Chest W Contrast  Result Date: 02/03/2019 CLINICAL DATA:  Increased weakness and falls for the last 3 days EXAM: CT CHEST, ABDOMEN, AND PELVIS WITH CONTRAST TECHNIQUE: Multidetector CT imaging of the chest, abdomen and pelvis was performed following the standard protocol during bolus administration of intravenous contrast. CONTRAST:  158mL OMNIPAQUE IOHEXOL 350 MG/ML SOLN COMPARISON:  CT chest, abdomen and pelvis 12/02/2015 FINDINGS: CT CHEST FINDINGS Cardiovascular: The aorta is normal caliber. No dissection flap or other acute luminal abnormality of the aorta is seen. No periaortic stranding  or hemorrhage. Atherosclerotic plaque within the normal caliber aorta. Normal 3 vessel branching of the arch. Central pulmonary arteries are normal caliber. No large central filling defects on this non tailored examination. Normal heart size. No pericardial effusion. Mediastinum/Nodes: No mediastinal fluid or hemorrhage. Physiologic amount of fluid present within the pericardial recesses. There is mediastinal and right hilar adenopathy. Findings include a centrally hypoattenuating 1.9 cm subcarinal nodule (4/27) and a 1.4 cm right hilar node with central hypoattenuation (4/27). There is a borderline enlarged right axillary node measuring 10 mm (4/32). Few nodular soft tissue densities in the upper-outer quadrant of the right breast with a more focal cutaneous based lesion in the lower inner quadrant measuring up to 2 cm in size. Lungs/Pleura: There is a multilobulated mass lesion abutting the pleura with acute angular margins in the medial aspect of the right middle lobe. This measures up to 3 cm in size (6/87). Smaller adjacent 9 mm satellite nodule is noted (6/76 few additional satellite nodules are noted adjacent the right hilar vessels and bronchi. Subpleural 5 mm nodule noted in the lateral left upper lobe (6/64). No acute traumatic abnormality of the lung parenchyma. No consolidation, features of edema, pneumothorax, or effusion. Musculoskeletal: Dedicated thoracic spine reconstructions were generated, please see that report for further details of the spine. No visible displaced rib fractures. No suspicious osseous lesions in the chest CT ABDOMEN PELVIS FINDINGS Hepatobiliary: No direct hepatic injury or perihepatic hematoma. Subcentimeter hypoattenuating focus in the posterior right lobe liver (3/2) is too small to fully characterize on CT imaging. No focal liver abnormality is seen. No gallstones, gallbladder wall thickening, or biliary dilatation. Pancreas: Unremarkable. No pancreatic ductal dilatation or  surrounding inflammatory changes. Spleen: Normal in size without focal abnormality. Adrenals/Urinary Tract: Irregular centrally hypoattenuating 3.3 cm lesion in the left adrenal gland. Additional hypertense 1.6 cm nodule in the lateral limb right adrenal gland. No convincing adrenal hemorrhage is seen however patient appears to be post left nephrectomy with some surgical material in the left nephrectomy bed. No right renal injury or perirenal hemorrhage. No suspicious renal lesion. Normal enhancement and excretion without extravasation of contrast from the collecting system on urinary phase delays. Mild bladder wall thickening. Faint perivesicular haze. No direct bladder injury. Stomach/Bowel: Distal esophagus, stomach and duodenal sweep are unremarkable. No small bowel wall thickening or dilatation. No evidence of obstruction. A normal appendix is visualized. No colonic dilatation or wall thickening. No sites of mesenteric hematoma or contusion. Vascular/Lymphatic: No direct vascular injury in the abdomen or  pelvis. Few prominent retroperitoneal nodes are noted including a 8 mm periaortic lymph node (4/72). Reproductive: Patient is post hysterectomy. No concerning adnexal lesions. Postsurgical changes noted in the low anterior abdomen Other: Mild body wall edema. No large body wall hematoma. No bowel containing hernia. No traumatic abdominal wall injury or dehiscence. Musculoskeletal: Dedicated lumbar spine reconstructions were generated, please see that report for further details. No abnormalities of the bony pelvis. No intramuscular hematoma or other acute muscular abnormality is seen. Slight age related atrophy is noted. IMPRESSION: 1. Compression deformities at T12 and L3, better detailed dedicated thoracolumbar spine reconstructions. Please see that report for further details. 2. No other evidence of acute traumatic injury to the chest, abdomen or pelvis. 3. Lobulated mass lesion in the medial aspect of the  right middle lobe with adjacent satellite nodules and mediastinal and right hilar adenopathy. 4. Soft tissue nodule in the lower inner quadrant of the right breast with additional nodularity in the upper-outer quadrant of the same breast. Should correlate with mammography and dedicated breast imaging. 5. Bilateral adrenal masses concerning for metastatic disease. 6. Subcentimeter hypoattenuating focus in the right lobe liver as well as some prominent though nonenlarged lymph nodes, while these are typically characterized as benign incidental findings, should be viewed with some suspicion given the findings above. 7. Mild bladder wall thickening with subtle perivesicular haze, which may represent cystitis. 8. Prior left nephrectomy. 9.  Aortic Atherosclerosis (ICD10-I70.0). These results were called by telephone at the time of interpretation on 02/03/2019 at 9:12 pm to provider [facility] , who verbally acknowledged these results. Electronically Signed   By: Lovena Le M.D.   On: 02/03/2019 21:14   CT Cervical Spine Wo Contrast  Result Date: 02/03/2019 CLINICAL DATA:  Fall. EXAM: CT CERVICAL SPINE WITHOUT CONTRAST TECHNIQUE: Multidetector CT imaging of the cervical spine was performed without intravenous contrast. Multiplanar CT image reconstructions were also generated. COMPARISON:  MRI cervical spine 12/27/2011 FINDINGS: Alignment: Normal Skull base and vertebrae: Negative for fracture Soft tissues and spinal canal: Negative for soft tissue mass or swelling. Prior thyroidectomy. Disc levels: Disc degeneration and spurring multiple levels. Bilateral foraminal stenosis at C3-4, C4-5, C5-6, C6-7 due to spurring. Upper chest: Lung apices clear bilaterally. Other: There is mass-effect on the posterior fossa with effacement of the fourth ventricle and obstructive hydrocephalus. There is question of high-density subdural blood in the right posterior fossa. This area is not well evaluated due to streak  artifact. Recommend stat head CT. IMPRESSION: Abnormal mass-effect in the posterior fossa with obstructive hydrocephalus. Question subdural hematoma or mass in the posterior fossa on the right. Recommend stat head CT. Cervical spondylosis without fracture These results were called by telephone at the time of interpretation on 02/03/2019 at 8:49 pm to provider [facility] , who verbally acknowledged these results. Electronically Signed   By: Franchot Gallo M.D.   On: 02/03/2019 20:50   MR BRAIN W WO CONTRAST  Result Date: 02/05/2019 CLINICAL DATA:  Post craniotomy EXAM: MRI HEAD WITHOUT AND WITH CONTRAST TECHNIQUE: Multiplanar, multiecho pulse sequences of the brain and surrounding structures were obtained without and with intravenous contrast. CONTRAST:  23mL GADAVIST GADOBUTROL 1 MMOL/ML IV SOLN COMPARISON:  Post craniotomy FINDINGS: Brain: There are new postoperative changes of right suboccipital craniotomy for resection of dominant right cerebellar mass. Resection cavity contains air, fluid, and blood products with minimal, likely postsurgical enhancement at the margins. Mild reduced diffusion at the cavity margins likely reflects postoperative contusion. Surrounding edema remains similar.  There is persistent mass-effect on the fourth ventricle with resulting similar hydrocephalus. Additional enhancing lesions and associated edema are stable over the short interval. Foci of T2 hyperintensity in the supratentorial white matter likely reflect a combination chronic microvascular ischemic changes and is digital edema related to hydrocephalus. There are scattered extra-axial foci of susceptibility likely reflecting pneumocephalus. Vascular: Major vessel flow voids at the skull base are preserved. Skull and upper cervical spine: There is abnormal T1 marrow signal eccentric to the right at C3 and possibly C4. Sinuses/Orbits: Paranasal sinuses are aerated. Orbits are unremarkable. Other: Sella is  unremarkable.  Mastoid air cells are clear. 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 Associated mass effect and hydrocephalus are similar. Stable additional metastatic lesions. Abnormal marrow signal the upper cervical spine suspicious for metastatic disease. Electronically Signed   By: Macy Mis M.D.   On: 02/05/2019 08:35   MR BRAIN W WO CONTRAST  Result Date: 02/04/2019 CLINICAL DATA:  Occurrence falls and weakness. History of multiple carcinomas. EXAM: MRI HEAD WITHOUT AND WITH CONTRAST TECHNIQUE: Multiplanar, multiecho pulse sequences of the brain and surrounding structures were obtained without and with intravenous contrast. CONTRAST:  57mL GADAVIST GADOBUTROL 1 MMOL/ML IV SOLN COMPARISON:  Head CT 02/03/2019 FINDINGS: Brain: There is a lesion of the right cerebellar hemisphere with nodular peripheral contrast enhancement, measuring 2.8 x 2.6 cm. There is associated hemorrhage is demonstrated on the earlier head CT. This causes moderate edema in the right cerebellar hemisphere with mass effect on the fourth ventricle. There is also a lesion of the superior cerebellar vermis that measures 1.5 x 1.4 cm. There are subcentimeter lesions of the left deep gray nuclei, left occipital lobe, left parietal white matter and left cerebellum. Early confluent hyperintense T2-weighted signal of the periventricular and deep white matter, most commonly due to chronic ischemic microangiopathy. Advanced atrophy for age. Blood-sensitive sequences show a single focus of chronic microhemorrhage in the right frontal white matter. The midline structures are normal. Vascular: Normal flow voids. Skull and upper cervical spine: Normal marrow signal. Sinuses/Orbits: Negative. Other: None. IMPRESSION: 1. Seven intracranial metastatic lesions, the largest of which is in the right cerebellar hemisphere and measures up to 2.6 cm with associated hemorrhage and moderate  mass effect on the fourth ventricle. 2. Advanced atrophy and chronic ischemic microangiopathy. Lesions are annotated on series 11, axial postcontrast T1-weighted imaging. Electronically Signed   By: Ulyses Jarred M.D.   On: 02/04/2019 01:28   CT ABDOMEN PELVIS W CONTRAST  Result Date: 02/03/2019 CLINICAL DATA:  Increased weakness and falls for the last 3 days EXAM: CT CHEST, ABDOMEN, AND PELVIS WITH CONTRAST TECHNIQUE: Multidetector CT imaging of the chest, abdomen and pelvis was performed following the standard protocol during bolus administration of intravenous contrast. CONTRAST:  165mL OMNIPAQUE IOHEXOL 350 MG/ML SOLN COMPARISON:  CT chest, abdomen and pelvis 12/02/2015 FINDINGS: CT CHEST FINDINGS Cardiovascular: The aorta is normal caliber. No dissection flap or other acute luminal abnormality of the aorta is seen. No periaortic stranding or hemorrhage. Atherosclerotic plaque within the normal caliber aorta. Normal 3 vessel branching of the arch. Central pulmonary arteries are normal caliber. No large central filling defects on this non tailored examination. Normal heart size. No pericardial effusion. Mediastinum/Nodes: No mediastinal fluid or hemorrhage. Physiologic amount of fluid present within the pericardial recesses. There is mediastinal and right hilar adenopathy. Findings include a centrally hypoattenuating 1.9 cm subcarinal nodule (4/27) and a 1.4 cm right hilar node with central hypoattenuation (  4/27). There is a borderline enlarged right axillary node measuring 10 mm (4/32). Few nodular soft tissue densities in the upper-outer quadrant of the right breast with a more focal cutaneous based lesion in the lower inner quadrant measuring up to 2 cm in size. Lungs/Pleura: There is a multilobulated mass lesion abutting the pleura with acute angular margins in the medial aspect of the right middle lobe. This measures up to 3 cm in size (6/87). Smaller adjacent 9 mm satellite nodule is noted (6/76 few  additional satellite nodules are noted adjacent the right hilar vessels and bronchi. Subpleural 5 mm nodule noted in the lateral left upper lobe (6/64). No acute traumatic abnormality of the lung parenchyma. No consolidation, features of edema, pneumothorax, or effusion. Musculoskeletal: Dedicated thoracic spine reconstructions were generated, please see that report for further details of the spine. No visible displaced rib fractures. No suspicious osseous lesions in the chest CT ABDOMEN PELVIS FINDINGS Hepatobiliary: No direct hepatic injury or perihepatic hematoma. Subcentimeter hypoattenuating focus in the posterior right lobe liver (3/2) is too small to fully characterize on CT imaging. No focal liver abnormality is seen. No gallstones, gallbladder wall thickening, or biliary dilatation. Pancreas: Unremarkable. No pancreatic ductal dilatation or surrounding inflammatory changes. Spleen: Normal in size without focal abnormality. Adrenals/Urinary Tract: Irregular centrally hypoattenuating 3.3 cm lesion in the left adrenal gland. Additional hypertense 1.6 cm nodule in the lateral limb right adrenal gland. No convincing adrenal hemorrhage is seen however patient appears to be post left nephrectomy with some surgical material in the left nephrectomy bed. No right renal injury or perirenal hemorrhage. No suspicious renal lesion. Normal enhancement and excretion without extravasation of contrast from the collecting system on urinary phase delays. Mild bladder wall thickening. Faint perivesicular haze. No direct bladder injury. Stomach/Bowel: Distal esophagus, stomach and duodenal sweep are unremarkable. No small bowel wall thickening or dilatation. No evidence of obstruction. A normal appendix is visualized. No colonic dilatation or wall thickening. No sites of mesenteric hematoma or contusion. Vascular/Lymphatic: No direct vascular injury in the abdomen or pelvis. Few prominent retroperitoneal nodes are noted  including a 8 mm periaortic lymph node (4/72). Reproductive: Patient is post hysterectomy. No concerning adnexal lesions. Postsurgical changes noted in the low anterior abdomen Other: Mild body wall edema. No large body wall hematoma. No bowel containing hernia. No traumatic abdominal wall injury or dehiscence. Musculoskeletal: Dedicated lumbar spine reconstructions were generated, please see that report for further details. No abnormalities of the bony pelvis. No intramuscular hematoma or other acute muscular abnormality is seen. Slight age related atrophy is noted. IMPRESSION: 1. Compression deformities at T12 and L3, better detailed dedicated thoracolumbar spine reconstructions. Please see that report for further details. 2. No other evidence of acute traumatic injury to the chest, abdomen or pelvis. 3. Lobulated mass lesion in the medial aspect of the right middle lobe with adjacent satellite nodules and mediastinal and right hilar adenopathy. 4. Soft tissue nodule in the lower inner quadrant of the right breast with additional nodularity in the upper-outer quadrant of the same breast. Should correlate with mammography and dedicated breast imaging. 5. Bilateral adrenal masses concerning for metastatic disease. 6. Subcentimeter hypoattenuating focus in the right lobe liver as well as some prominent though nonenlarged lymph nodes, while these are typically characterized as benign incidental findings, should be viewed with some suspicion given the findings above. 7. Mild bladder wall thickening with subtle perivesicular haze, which may represent cystitis. 8. Prior left nephrectomy. 9.  Aortic Atherosclerosis (ICD10-I70.0). These  results were called by telephone at the time of interpretation on 02/03/2019 at 9:12 pm to provider [facility] , who verbally acknowledged these results. Electronically Signed   By: Lovena Le M.D.   On: 02/03/2019 21:14   CT T-SPINE NO CHARGE  Result Date:  02/03/2019 CLINICAL DATA:  Increasing weakness and multiple falls for 3 days EXAM: CT THORACIC AND LUMBAR SPINE WITHOUT CONTRAST TECHNIQUE: Multiplanar CT images of the thoracic and lumbar spine were reconstructed from contemporary CT of the Chest, abdomen and pelvis. CONTRAST:  None or No additional COMPARISON:  Chest radiograph 02/03/2019 CT abdomen pelvis 05/04/2015, chest radiograph 05/02/2017, lumbar radiographs 10/13/2018 FINDINGS: THORACIC SPINE: Alignment: There is straightening of the normal thoracic kyphosis throughout the lower thoracic levels with focal kyphotic curvature at the level of the T12 compression deformity. Mild dextrocurvature throughout the thoracic spine with an apex at the T11 level. No traumatic listhesis. No abnormal widened, jumped or perched facets. Vertebrae: There is multilevel Schmorl's node formations. Anterior wedging compression deformity of T12 (AO spine A1) with approximately 50% height loss anteriorly some persistent lucency suggesting acuity. No other acute osseous injury or compression deformity is seen in the thoracic spine. No suspicious osseous lesions. Paraspinal and other soft tissues: Small amount paravertebral soft tissue thickening/stranding adjacent the T12 compression deformity. Finding favors acuity. For findings within the posterior chest and mediastinum, see dedicated CT from which this study is reconstructed. Disc levels: There is diffuse multilevel intervertebral disc height loss with discogenic endplate changes and some posterior disc osteophyte formation at T4-T5, T5-T6 and T7-T8 but without significant resulting canal stenosis or neural foraminal narrowing in the imaged thoracic spine. LUMBAR SPINE: Segmentation: 5 non-rib-bearing lumbar type vertebral levels. Alignment: There is slight levocurvature of the lumbar spine with an apex at L3. No abnormally widened, jumped or perched facets. Vertebrae: There is a split/pincer type compression deformity of L3  (AO spine A2). Central height loss of approximately 50%. No abnormal posterior wall involvement or retropulsion of fracture fragments. No additional acute fracture is seen. Posterior elements are intact. No suspicious osseous lesions. Multilevel Schmorl's node formations seen in the lumbar spine. Paraspinal and other soft tissues: Small amount of paravertebral soft tissue thickening at the L3 level suggests acuity. No visible canal hematoma. For findings in the posterior abdomen and pelvis, see dedicated CT from which this study is reconstructed. Disc levels: Multilevel intervertebral disc height loss throughout the lumbar spine with multilevel global disc bulges. Central disc protrusions at L2 and L3 result in mild canal stenosis. A left central disc protrusion at L1 results in more moderate canal narrowing. Facet hypertrophic changes are maximal at L5-S1 with resulting mild to moderate multilevel neural foraminal narrowing the lumbar spine. IMPRESSION: 1. Anterior wedging compression deformity T12 with 50% height loss anteriorly (AOSpineA1). 2. Split/Pincer type compression deformity of L3 with approximately 50% height loss anteriorly (AOSpine A2). 3. Faint paraspinal soft tissue thickening adjacent both these deformities suggest acuity. 4. No other acute osseous injuries in the thoracic or lumbar spine. 5. Multilevel degenerative changes of the thoracic and lumbar spine, as described above. 6. For findings in the posterior chest, abdomen and pelvis, see dedicated CT from which this study is reconstructed. Electronically Signed   By: Lovena Le M.D.   On: 02/03/2019 20:56   CT L-SPINE NO CHARGE  Result Date: 02/03/2019 CLINICAL DATA:  Increasing weakness and multiple falls for 3 days EXAM: CT THORACIC AND LUMBAR SPINE WITHOUT CONTRAST TECHNIQUE: Multiplanar CT images of the thoracic  and lumbar spine were reconstructed from contemporary CT of the Chest, abdomen and pelvis. CONTRAST:  None or No additional  COMPARISON:  Chest radiograph 02/03/2019 CT abdomen pelvis 05/04/2015, chest radiograph 05/02/2017, lumbar radiographs 10/13/2018 FINDINGS: THORACIC SPINE: Alignment: There is straightening of the normal thoracic kyphosis throughout the lower thoracic levels with focal kyphotic curvature at the level of the T12 compression deformity. Mild dextrocurvature throughout the thoracic spine with an apex at the T11 level. No traumatic listhesis. No abnormal widened, jumped or perched facets. Vertebrae: There is multilevel Schmorl's node formations. Anterior wedging compression deformity of T12 (AO spine A1) with approximately 50% height loss anteriorly some persistent lucency suggesting acuity. No other acute osseous injury or compression deformity is seen in the thoracic spine. No suspicious osseous lesions. Paraspinal and other soft tissues: Small amount paravertebral soft tissue thickening/stranding adjacent the T12 compression deformity. Finding favors acuity. For findings within the posterior chest and mediastinum, see dedicated CT from which this study is reconstructed. Disc levels: There is diffuse multilevel intervertebral disc height loss with discogenic endplate changes and some posterior disc osteophyte formation at T4-T5, T5-T6 and T7-T8 but without significant resulting canal stenosis or neural foraminal narrowing in the imaged thoracic spine. LUMBAR SPINE: Segmentation: 5 non-rib-bearing lumbar type vertebral levels. Alignment: There is slight levocurvature of the lumbar spine with an apex at L3. No abnormally widened, jumped or perched facets. Vertebrae: There is a split/pincer type compression deformity of L3 (AO spine A2). Central height loss of approximately 50%. No abnormal posterior wall involvement or retropulsion of fracture fragments. No additional acute fracture is seen. Posterior elements are intact. No suspicious osseous lesions. Multilevel Schmorl's node formations seen in the lumbar spine.  Paraspinal and other soft tissues: Small amount of paravertebral soft tissue thickening at the L3 level suggests acuity. No visible canal hematoma. For findings in the posterior abdomen and pelvis, see dedicated CT from which this study is reconstructed. Disc levels: Multilevel intervertebral disc height loss throughout the lumbar spine with multilevel global disc bulges. Central disc protrusions at L2 and L3 result in mild canal stenosis. A left central disc protrusion at L1 results in more moderate canal narrowing. Facet hypertrophic changes are maximal at L5-S1 with resulting mild to moderate multilevel neural foraminal narrowing the lumbar spine. IMPRESSION: 1. Anterior wedging compression deformity T12 with 50% height loss anteriorly (AOSpineA1). 2. Split/Pincer type compression deformity of L3 with approximately 50% height loss anteriorly (AOSpine A2). 3. Faint paraspinal soft tissue thickening adjacent both these deformities suggest acuity. 4. No other acute osseous injuries in the thoracic or lumbar spine. 5. Multilevel degenerative changes of the thoracic and lumbar spine, as described above. 6. For findings in the posterior chest, abdomen and pelvis, see dedicated CT from which this study is reconstructed. Electronically Signed   By: Lovena Le M.D.   On: 02/03/2019 20:56   DG Shoulder Left  Result Date: 02/03/2019 CLINICAL DATA:  Shoulder pain EXAM: LEFT SHOULDER - 2+ VIEW COMPARISON:  None FINDINGS: There is no evidence of fracture or dislocation. There is no evidence of arthropathy or other focal bone abnormality. Soft tissues are unremarkable. IMPRESSION: Negative evaluation of the left shoulder. Electronically Signed   By: Zetta Bills M.D.   On: 02/03/2019 19:43   DG Hand Complete Left  Result Date: 02/03/2019 CLINICAL DATA:  Hand pain EXAM: LEFT HAND - COMPLETE 3+ VIEW COMPARISON:  None FINDINGS: Degenerative changes at the first carpometacarpal joint. No signs of fracture or  dislocation. Soft tissues are  unremarkable. IMPRESSION: Degenerative changes without fracture. Electronically Signed   By: Zetta Bills M.D.   On: 02/03/2019 19:42        Scheduled Meds:  buPROPion  200 mg Oral Daily   busPIRone  15 mg Oral BID   Chlorhexidine Gluconate Cloth  6 each Topical Q0600   dexamethasone (DECADRON) injection  4 mg Intravenous Q6H   docusate sodium  100 mg Oral BID   irbesartan  300 mg Oral Daily   levothyroxine  88 mcg Oral Q0600   pantoprazole (PROTONIX) IV  40 mg Intravenous QHS   pramipexole  0.5 mg Oral QHS   rosuvastatin  10 mg Oral QHS   senna  1 tablet Oral BID   sertraline  100 mg Oral Daily   Continuous Infusions:  sodium chloride Stopped (02/04/19 2010)   sodium chloride 200 mL/hr at 02/04/19 1557   sodium chloride 75 mL/hr at 02/05/19 1419   levETIRAcetam 500 mg (02/05/19 0934)     LOS: 2 days     Cordelia Poche, MD [facility] 02/05/2019, 3:27 PM  If 7PM-7AM, please contact night-coverage [email or web address]

## 2019-02-05 NOTE — Progress Notes (Signed)
At MRI. Patient is restless and not holding still for imaging, despite multiple attempts and reminding patient. No PRN medications available for sedation or anxiety. This RN paged neurosurgery NP on call Reinaldo Meeker and updated NP on situation. No new orders received. Will assess patient's pain and see if that is causing the restlessness. Will continue to monitor.

## 2019-02-06 DIAGNOSIS — R001 Bradycardia, unspecified: Secondary | ICD-10-CM

## 2019-02-06 DIAGNOSIS — I495 Sick sinus syndrome: Secondary | ICD-10-CM

## 2019-02-06 LAB — CBC
HCT: 28.4 % — ABNORMAL LOW (ref 36.0–46.0)
Hemoglobin: 9.2 g/dL — ABNORMAL LOW (ref 12.0–15.0)
MCH: 31.1 pg (ref 26.0–34.0)
MCHC: 32.4 g/dL (ref 30.0–36.0)
MCV: 95.9 fL (ref 80.0–100.0)
Platelets: 259 10*3/uL (ref 150–400)
RBC: 2.96 MIL/uL — ABNORMAL LOW (ref 3.87–5.11)
RDW: 13.2 % (ref 11.5–15.5)
WBC: 13.6 10*3/uL — ABNORMAL HIGH (ref 4.0–10.5)
nRBC: 0 % (ref 0.0–0.2)

## 2019-02-06 LAB — BASIC METABOLIC PANEL
Anion gap: 11 (ref 5–15)
BUN: 16 mg/dL (ref 8–23)
CO2: 20 mmol/L — ABNORMAL LOW (ref 22–32)
Calcium: 9.3 mg/dL (ref 8.9–10.3)
Chloride: 107 mmol/L (ref 98–111)
Creatinine, Ser: 0.81 mg/dL (ref 0.44–1.00)
GFR calc Af Amer: >60 mL/min (ref >60)
GFR calc non Af Amer: >60 mL/min (ref >60)
Glucose, Bld: 135 mg/dL — ABNORMAL HIGH (ref 70–99)
Potassium: 3.4 mmol/L — ABNORMAL LOW (ref 3.5–5.1)
Sodium: 138 mmol/L (ref 135–145)

## 2019-02-06 LAB — URINE CULTURE: Culture: >=100000 — AB

## 2019-02-06 MED ORDER — DEXAMETHASONE SODIUM PHOSPHATE 10 MG/ML IJ SOLN
6.0000 mg | Freq: Four times a day (QID) | INTRAMUSCULAR | Status: DC
  Administered 2019-02-06 – 2019-02-12 (×23): 6 mg via INTRAVENOUS
  Filled 2019-02-06 (×9): qty 0.6
  Filled 2019-02-06: qty 1
  Filled 2019-02-06: qty 0.6
  Filled 2019-02-06: qty 1
  Filled 2019-02-06 (×10): qty 0.6
  Filled 2019-02-06: qty 1
  Filled 2019-02-06 (×2): qty 0.6

## 2019-02-06 MED ORDER — LEVOTHYROXINE SODIUM 50 MCG PO TABS
62.5000 ug | ORAL_TABLET | Freq: Every day | ORAL | Status: DC
  Administered 2019-02-07 – 2019-02-10 (×4): 62.5 ug via ORAL
  Filled 2019-02-06 (×4): qty 1

## 2019-02-06 MED ORDER — POTASSIUM CHLORIDE CRYS ER 20 MEQ PO TBCR
40.0000 meq | EXTENDED_RELEASE_TABLET | Freq: Once | ORAL | Status: AC
  Administered 2019-02-06: 10:00:00 40 meq via ORAL
  Filled 2019-02-06: qty 2

## 2019-02-06 MED ORDER — PANTOPRAZOLE SODIUM 40 MG PO TBEC
40.0000 mg | DELAYED_RELEASE_TABLET | Freq: Every day | ORAL | Status: DC
  Administered 2019-02-06 – 2019-02-15 (×10): 40 mg via ORAL
  Filled 2019-02-06 (×10): qty 1

## 2019-02-06 NOTE — Progress Notes (Signed)
PROGRESS NOTE    Krista Hodges  [id number] DOB: Dec 21, 1955 DOA: 02/03/2019 PCP: Josetta Huddle, MD   Brief Narrative: Krista Hodges is a 63 y.o. 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 Patient presented secondary to worsening confusion and found to have a cerebellar mass with vasogenic edema with concern for metastatic disease.   Assessment & Plan:   Active Problems:   Essential (primary) hypertension   Brain tumor ([place])   Hypokalemia   Hypothyroidism   Cerebellar mass with vasogenic edema Neurosurgery consulted and started decadron. Plan for surgery. CT scan of head/chest/abdomen/pelvis concerning for multiple areas of metastatic disease. S/p suboccipital craniectomy -Neurosurgery recommendations: increasing dexamethasone  Essential hypertension Well controlled -Continue irbesartan  Lethargy Unknown if secondary to narcotic medication vs above problem. Neurosurgery managing. Mentation is improved today.  Sinus bradycardia Does not seem symptomatic but is lethargic at times. Bradycardia in the 30-40 bpm range. Upon personal telemetry review, patient does have episodes of significant tachycardia up to the 120-140 bpm range. Hypothyroidism likely playing some role. -Will consult cardiology  Hypokalemia Given supplementation with improvement. Magnesium normal.  Anemia Appears to be acute. Unsure if any source of bleeding at this time -Daily CBC  Hyperlipidemia -Continue Crestor  Hypothyroidism TSH of 75.67. On Synthroid 50 mcg as an outpatient. Started on Synthroid 88 mcg inpatient. Patient states she was not taking her Synthroid regularly -Decrease to Synthroid 62.5 mcg daily  Depression -Continue Zoloft and Wellbutrin  Cognitive impairment -Continue Aricept and  Namenda  Compression fractures T12 and L3 noted incidentally. TLSO brace ordered  History of papillary thyroid carcinoma/endometrial carcinoma/renal cell carcinoma  Follows at [facility]  Hyperglycemia Hemoglobin A1C of 5.6.   DVT prophylaxis: SCDs Code Status:   Code Status: Full Code Family Communication: None Disposition Plan: Discharge per primary    Procedures:   Craniectomy  Antimicrobials:  None    Subjective: No issues overnight  Objective: Vitals:   02/06/19 0500 02/06/19 0600 02/06/19 0627 02/06/19 0700  BP: 119/67 (!) 164/65 138/67 139/77  Pulse: (!) 42 (!) 41  (!) 43  Resp: 10 12  11   Temp:      TempSrc:      SpO2: 94% 96%  96%  Weight:      Height:        Intake/Output Summary (Last 24 hours) at 02/06/2019 0858 Last data filed at 02/06/2019 0700 Gross per 24 hour  Intake 1945.46 ml  Output 1455 ml  Net 490.46 ml   Filed Weights   02/04/19 1152  Weight: 65 kg    Examination:  General exam: Appears calm and comfortable Respiratory system: Clear to auscultation. Respiratory effort normal. Cardiovascular system: S1 & S2 heard, Bradycardia. Normal rhythm Gastrointestinal system: Abdomen is nondistended, soft and nontender. No organomegaly or masses felt. Normal bowel sounds heard. Central nervous system: Somnolent but arouses easily and is oriented to person, city, hospital. She did not know which hospital or date. Extremities: No edema. No calf tenderness Skin: No cyanosis. No rashes Psychiatry: Judgement and insight appear impaired. Mood & affect appropriate.  Data Reviewed: I have personally reviewed following labs and imaging studies  CBC: Recent Labs  Lab 02/03/19 1827 02/04/19 1244 02/04/19 1251 02/04/19 1307 02/05/19 0653 02/06/19 0800  WBC 13.0*  --   --  10.5 15.3* 13.6*  NEUTROABS 11.9*  --   --   --   --   --  HGB 11.0* 7.8* 8.5* 9.2* 9.2* 9.2*  HCT 34.4* 23.0* 25.0* 28.6* 28.7* 28.4*  MCV 96.1  --   --  95.3  95.3 95.9  PLT 332  --   --  309 296 [id number]   Basic Metabolic Panel: Recent Labs  Lab 02/03/19 1827 02/04/19 0342 02/04/19 1244 02/04/19 1251 02/05/19 0653 02/06/19 0424  NA 135 135 139 138 138 138  K 2.7* 3.4* 3.0* 3.3* 3.5 3.4*  CL 98 100  --   --  108 107  CO2 26 25  --   --  21* 20*  GLUCOSE 154* 130*  --   --  115* 135*  BUN 18 16  --   --  16 16  CREATININE 1.04* 0.92  --   --  0.87 0.81  CALCIUM 9.5 9.6  --   --  9.5 9.3  MG  --  1.8  --   --   --   --    GFR: Estimated Creatinine Clearance: 64.8 mL/min (by C-G formula based on SCr of 0.81 mg/dL). Liver Function Tests: Recent Labs  Lab 02/03/19 1827  AST 16  ALT 12  ALKPHOS 103  BILITOT 0.5  PROT 6.3*  ALBUMIN 3.2*   No results for input(s): LIPASE, AMYLASE in the last 168 hours. No results for input(s): AMMONIA in the last 168 hours. Coagulation Profile: Recent Labs  Lab 02/03/19 2151  INR 1.0   Cardiac Enzymes: Recent Labs  Lab 02/03/19 1827  CKTOTAL 46   BNP (last 3 results) No results for input(s): PROBNP in the last 8760 hours. HbA1C: Recent Labs    02/05/19 0653  HGBA1C 5.6   CBG: Recent Labs  Lab 02/03/19 1803  GLUCAP 165*   Lipid Profile: No results for input(s): CHOL, HDL, LDLCALC, TRIG, CHOLHDL, LDLDIRECT in the last 72 hours. Thyroid Function Tests: Recent Labs    02/03/19 1854  TSH 75.666*  FREET4 0.45*   Anemia Panel: No results for input(s): VITAMINB12, FOLATE, FERRITIN, TIBC, IRON, RETICCTPCT in the last 72 hours. Sepsis Labs: No results for input(s): PROCALCITON, LATICACIDVEN in the last 168 hours.  Recent Results (from the past 240 hour(s))  Respiratory Panel by RT PCR (Flu A&B, Covid) - Nasopharyngeal Swab     Status: None   Collection Time: 02/03/19  9:12 PM   Specimen: Nasopharyngeal Swab  Result Value Ref Range Status   SARS Coronavirus 2 by RT PCR NEGATIVE NEGATIVE Final    Comment: (NOTE) SARS-CoV-2 target nucleic acids are NOT DETECTED. The SARS-CoV-2 RNA  is generally detectable in upper respiratoy specimens during the acute phase of infection. The lowest concentration of SARS-CoV-2 viral copies this assay can detect is 131 copies/mL. A negative result does not preclude SARS-Cov-2 infection and should not be used as the sole basis for treatment or other patient management decisions. A negative result may occur with  improper specimen collection/handling, submission of specimen other than nasopharyngeal swab, presence of viral mutation(s) within the areas targeted by this assay, and inadequate number of viral copies (<131 copies/mL). A negative result must be combined with clinical observations, patient history, and epidemiological information. The expected result is Negative. Fact Sheet for Patients:  PinkCheek.be Fact Sheet for Healthcare Providers:  GravelBags.it This test is not yet ap proved or cleared by the [place] FDA and  has been authorized for detection and/or diagnosis of SARS-CoV-2 by FDA under an Emergency Use Authorization (EUA). This EUA will remain  in effect (meaning this test can be  used) for the duration of the COVID-19 declaration under Section 564(b)(1) of the Act, 21 U.S.C. section 360bbb-3(b)(1), unless the authorization is terminated or revoked sooner.    Influenza A by PCR NEGATIVE NEGATIVE Final   Influenza B by PCR NEGATIVE NEGATIVE Final    Comment: (NOTE) The Xpert Xpress SARS-CoV-2/FLU/RSV assay is intended as an aid in  the diagnosis of influenza from Nasopharyngeal swab specimens and  should not be used as a sole basis for treatment. Nasal washings and  aspirates are unacceptable for Xpert Xpress SARS-CoV-2/FLU/RSV  testing. Fact Sheet for Patients: PinkCheek.be Fact Sheet for Healthcare Providers: GravelBags.it This test is not yet approved or cleared by the [place] FDA and   has been authorized for detection and/or diagnosis of SARS-CoV-2 by  FDA under an Emergency Use Authorization (EUA). This EUA will remain  in effect (meaning this test can be used) for the duration of the  Covid-19 declaration under Section 564(b)(1) of the Act, 21  U.S.C. section 360bbb-3(b)(1), unless the authorization is  terminated or revoked. Performed at [facility], [place] [address] [place], [place] [address]   Culture, Urine     Status: Abnormal   Collection Time: 02/03/19 11:37 PM   Specimen: Urine, Random  Result Value Ref Range Status   Specimen Description URINE, RANDOM  Final   Special Requests   Final    NONE Performed at [facility], [place] [address], [place], [place] [address]    Culture >=100,000 COLONIES/mL ESCHERICHIA COLI (A)  Final   Report Status 02/06/2019 FINAL  Final   Organism ID, Bacteria ESCHERICHIA COLI (A)  Final      Susceptibility   Escherichia coli - MIC*    AMPICILLIN 8 SENSITIVE Sensitive     CEFAZOLIN <=4 SENSITIVE Sensitive     CEFTRIAXONE <=1 SENSITIVE Sensitive     CIPROFLOXACIN <=0.25 SENSITIVE Sensitive     GENTAMICIN <=1 SENSITIVE Sensitive     IMIPENEM <=0.25 SENSITIVE Sensitive     NITROFURANTOIN 64 INTERMEDIATE Intermediate     TRIMETH/SULFA <=20 SENSITIVE Sensitive     AMPICILLIN/SULBACTAM <=2 SENSITIVE Sensitive     PIP/TAZO <=4 SENSITIVE Sensitive     * >=100,000 COLONIES/mL ESCHERICHIA COLI  MRSA PCR Screening     Status: None   Collection Time: 02/04/19  8:06 PM   Specimen: Nasopharyngeal  Result Value Ref Range Status   MRSA by PCR NEGATIVE NEGATIVE Final    Comment:        The GeneXpert MRSA Assay (FDA approved for NASAL specimens only), is one component of a comprehensive MRSA colonization surveillance program. It is not intended to diagnose MRSA infection nor to guide or monitor treatment for MRSA infections. Performed at [facility], [place] [address] [place], [place] [address]           Radiology Studies: MR BRAIN W WO CONTRAST  Result Date: 02/05/2019 CLINICAL DATA:  Post craniotomy EXAM: MRI HEAD WITHOUT AND WITH CONTRAST TECHNIQUE: Multiplanar, multiecho pulse sequences of the brain and surrounding structures were obtained without and with intravenous contrast. CONTRAST:  51mL GADAVIST GADOBUTROL 1 MMOL/ML IV SOLN COMPARISON:  Post craniotomy FINDINGS: Brain: There are new postoperative changes of right suboccipital craniotomy for resection of dominant right cerebellar mass. Resection cavity contains air, fluid, and blood products with minimal, likely postsurgical enhancement at the margins. Mild reduced diffusion at the cavity margins likely reflects postoperative contusion. Surrounding edema remains similar. There is persistent mass-effect on the fourth ventricle  with resulting similar hydrocephalus. Additional enhancing lesions and associated edema are stable over the short interval. Foci of T2 hyperintensity in the supratentorial white matter likely reflect a combination chronic microvascular ischemic changes and is digital edema related to hydrocephalus. There are scattered extra-axial foci of susceptibility likely reflecting pneumocephalus. Vascular: Major vessel flow voids at the skull base are preserved. Skull and upper cervical spine: There is abnormal T1 marrow signal eccentric to the right at C3 and possibly C4. Sinuses/Orbits: Paranasal sinuses are aerated. Orbits are unremarkable. Other: Sella is unremarkable.  Mastoid air cells are clear. 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 Associated mass effect and hydrocephalus are similar. Stable additional metastatic lesions. Abnormal marrow signal the upper cervical spine suspicious for metastatic disease. Electronically Signed   By: Macy Mis M.D.   On: 02/05/2019 08:35        Scheduled Meds: . buPROPion  200 mg Oral Daily  . busPIRone  15 mg  Oral BID  . Chlorhexidine Gluconate Cloth  6 each Topical Q0600  . dexamethasone (DECADRON) injection  6 mg Intravenous Q6H  . docusate sodium  100 mg Oral BID  . irbesartan  300 mg Oral Daily  . [START ON 02/07/2019] levothyroxine  62.5 mcg Oral Q0600  . pantoprazole (PROTONIX) IV  40 mg Intravenous QHS  . pramipexole  0.5 mg Oral QHS  . rosuvastatin  10 mg Oral QHS  . senna  1 tablet Oral BID  . sertraline  100 mg Oral Daily   Continuous Infusions: . sodium chloride Stopped (02/04/19 2010)  . sodium chloride 200 mL/hr at 02/04/19 1557  . sodium chloride 75 mL/hr at 02/06/19 0700  . levETIRAcetam Stopped (02/05/19 2318)     LOS: 3 days     Cordelia Poche, MD [facility] 02/06/2019, 8:58 AM  If 7PM-7AM, please contact night-coverage [email or web address]

## 2019-02-06 NOTE — Progress Notes (Addendum)
Subjective: Patient sitting up in bed, denies discomfort.  Did receive Dilaudid at 0220.  TLSO for T12 and L3 fractures at bedside.  Objective: Vital signs in last 24 hours: Vitals:   02/06/19 0500 02/06/19 0600 02/06/19 0627 02/06/19 0700  BP: 119/67 (!) 164/65 138/67 139/77  Pulse: (!) 42 (!) 41  (!) 43  Resp: 10 12  11   Temp:      TempSrc:      SpO2: 94% 96%  96%  Weight:      Height:        Intake/Output from previous day: 12/24 0701 - 12/25 0700 In: 1945.5 [I.V.:1595.5; IV Piggyback:350] Out: [id number] Intake/Output this shift: No intake/output data recorded.  Physical Exam: Lethargic, but more responsive than yesterday.  Oriented to name, hospital, and [place], but not to month or year.  Moving all 4 extremities to command.  CBC Recent Labs    02/04/19 1307 02/05/19 0653  WBC 10.5 15.3*  HGB 9.2* 9.2*  HCT 28.6* 28.7*  PLT 309 296   BMET Recent Labs    02/05/19 0653 02/06/19 0424  NA 138 138  K 3.5 3.4*  CL 108 107  CO2 21* 20*  GLUCOSE 115* 135*  BUN 16 16  CREATININE 0.87 0.81  CALCIUM 9.5 9.3    Studies/Results: MR BRAIN W WO CONTRAST  Result Date: 02/05/2019 CLINICAL DATA:  Post craniotomy EXAM: MRI HEAD WITHOUT AND WITH CONTRAST TECHNIQUE: Multiplanar, multiecho pulse sequences of the brain and surrounding structures were obtained without and with intravenous contrast. CONTRAST:  8mL GADAVIST GADOBUTROL 1 MMOL/ML IV SOLN COMPARISON:  Post craniotomy FINDINGS: Brain: There are new postoperative changes of right suboccipital craniotomy for resection of dominant right cerebellar mass. Resection cavity contains air, fluid, and blood products with minimal, likely postsurgical enhancement at the margins. Mild reduced diffusion at the cavity margins likely reflects postoperative contusion. Surrounding edema remains similar. There is persistent mass-effect on the fourth ventricle with resulting similar hydrocephalus. Additional enhancing lesions  and associated edema are stable over the short interval. Foci of T2 hyperintensity in the supratentorial white matter likely reflect a combination chronic microvascular ischemic changes and is digital edema related to hydrocephalus. There are scattered extra-axial foci of susceptibility likely reflecting pneumocephalus. Vascular: Major vessel flow voids at the skull base are preserved. Skull and upper cervical spine: There is abnormal T1 marrow signal eccentric to the right at C3 and possibly C4. Sinuses/Orbits: Paranasal sinuses are aerated. Orbits are unremarkable. Other: Sella is unremarkable.  Mastoid air cells are clear. 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 Associated mass effect and hydrocephalus are similar. Stable additional metastatic lesions. Abnormal marrow signal the upper cervical spine suspicious for metastatic disease. Electronically Signed   By: Macy Mis M.D.   On: 02/05/2019 08:35    Assessment/Plan: Patient with widely metastatic disease, with a history of multiple malignancies, 2 days status post resection of large right cerebellar metastasis.  Although lethargy lessened, will increase dexamethasone to 6 mg IV every 6 hours for now.  Further adjustments of dexamethasone as patient's condition progresses.  Hosie Spangle, MD 02/06/2019, 8:45 AM

## 2019-02-06 NOTE — Consult Note (Addendum)
Cardiology Consultation:   Patient ID: Krista Hodges; [id number]; 17-May-1955   Admit date: 02/03/2019 Date of Consult: 02/06/2019  Primary Care Provider: Josetta Huddle, MD Primary Cardiologist: Jenkins Rouge, MD 11/30/2013 Primary Electrophysiologist:  None   Patient Profile:   [name] is a 63 y.o. female with a hx of PVCs on ECG, nl ETT 2015, chronic fatigue, endometrial adenoCA, HTN, HLD, cerebellar tumor, hypothyroid after XRT for papillary thyroid CA, who is being seen today for the evaluation of tachy/brady at the request of Dr Lonny Prude.  History of Present Illness:   Krista Hodges was evaluated by Dr Johnsie Cancel in 2015 for palpitations. PVCs seen, ETT and echo ok.  Krista Hodges was admitted 12/22 for recurrent falls, weakness. Dx w/ cerebellar tumor. Had suboccipital craniotomy on 12/23. IM is following, pt admitted she had not been taking her Synthroid.   Pt was noted to have sustained bradycardia, heart rates in the 40s.  She was also noted to be tachycardic at times, with a heart rate 120-130.  Cardiology was asked to evaluate her.  Of note, her Synthroid dose on admission was 50 mcg daily.  It was increased to 88 mcg daily but that is being decreased to 62.5 mcg daily after she told the MDs she had not been taking it prior to admission.  Krista. Krista Hodges responds to her name, but is not able to answer questions very well.  She got 5-325 mg tablet of Vicodin about an hour ago, that may be affecting her ability to respond.  Earlier today she was oriented to name and place, and moving extremities on command.  She denies feeling her heart skip or race.  She denies chest pain.   Past Medical History:  Diagnosis Date  . Allergic rhinitis   . Anxiety state   . Arthritis, degenerative   . Atypical migraine   . Awareness of heartbeats   . Cancer of left kidney (HCC)    Left Kidney  . Cervical radiculitis   . CFIDS (chronic fatigue and immune dysfunction syndrome) ([place])   . Depression,  major, single episode, in partial remission ([place])   . Depression, neurotic   . Elevated serum creatinine 03/2016   history of (2.07)  . Endometrial adenocarcinoma ([place])    Stage II grade2  . Essential (primary) hypertension   . Gastric ulcer   . Heart murmur    pt. denies  . HLD (hyperlipidemia)   . Inflammation of hand joint   . Memory difficulties   . Premature complex, ventricular   . Renal disorder   . Renal mass, left   . Restless leg   . Shortness of breath dyspnea    occasional  . 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 Tremor    L arm  . Vitamin D deficiency     Past Surgical History:  Procedure Laterality Date  . c sections x 3     . CARPAL TUNNEL RELEASE     rt hand  . CRANIOTOMY N/A 02/04/2019   Procedure: Suboccipital CRANIECTOMY for  TUMOR EXCISION;  Surgeon: Consuella Lose, MD;  Location: [facility];  Service: Neurosurgery;  Laterality: N/A;  . LAPAROSCOPIC NEPHRECTOMY Left 05/21/2014   Procedure: LEFT RADICAL LAPAROSCOPIC NEPHRECTOMY;  Surgeon: Ardis Hughs, MD;  Location: [facility];  Service: Urology;  Laterality: Left;  Marland Kitchen MANDIBLE SURGERY    . NEPHRECTOMY TRANSPLANTED ORGAN     Left kidney removed  . ROBOTIC ASSISTED TOTAL HYSTERECTOMY WITH BILATERAL SALPINGO  OOPHERECTOMY  12/27/2015  . THYROIDECTOMY N/A 05/09/2017   Procedure: TOTAL THYROIDECTOMY WITH LIMITED CENTRAL COMPARTMENT LYMPH NODE DISSECTION;  Surgeon: Armandina Gemma, MD;  Location: [facility];  Service: General;  Laterality: N/A;  . TOTAL THYROIDECTOMY     05-09-17 Dr. Harlow Asa     Prior to Admission medications   Medication Sig Start Date End Date Taking? Authorizing Provider  acetaminophen (TYLENOL) 500 MG tablet Take 500-1,000 mg by mouth daily as needed for moderate pain or headache.   Yes [provider]  buPROPion (WELLBUTRIN) 100 MG tablet Take 100 mg by mouth daily.    Yes [provider]  busPIRone (BUSPAR) 15 MG tablet Take 15 mg by mouth 2 (two) times  daily.   Yes [provider]  donepezil (ARICEPT) 10 MG tablet Take 10 mg by mouth at bedtime.  01/02/19  Yes [provider]  hydrochlorothiazide (HYDRODIURIL) 25 MG tablet Take 25 mg by mouth daily.   Yes [provider]  irbesartan (AVAPRO) 300 MG tablet Take 300 mg daily by mouth.    Yes [provider]  levothyroxine (SYNTHROID) 50 MCG tablet Take 50 mcg by mouth daily. 01/02/19  Yes [provider]  memantine (NAMENDA) 5 MG tablet Take 5 mg by mouth 2 (two) times daily. 01/02/19  Yes [provider]  pramipexole (MIRAPEX) 0.5 MG tablet Take 0.5 mg by mouth at bedtime.   Yes [provider]  Propylene Glycol (SYSTANE BALANCE) 0.6 % SOLN Place 1 drop into both eyes daily.   Yes [provider]  rosuvastatin (CRESTOR) 10 MG tablet Take 10 mg by mouth daily.    Yes [provider]  sertraline (ZOLOFT) 100 MG tablet Take 50 mg by mouth 2 (two) times daily. 01/02/19  Yes [provider]  tiZANidine (ZANAFLEX) 4 MG tablet Take 4 mg by mouth as needed for muscle spasms.  12/19/18  Yes [provider]  traMADol-acetaminophen (ULTRACET) 37.5-325 MG tablet Take 1 tablet by mouth as needed for moderate pain.  01/02/19  Yes [provider]  traZODone (DESYREL) 50 MG tablet Take 50-100 mg by mouth at bedtime as needed for sleep.    Yes [provider]  calcium carbonate (TUMS) 500 MG chewable tablet Chew 2 tablets (400 mg of elemental calcium total) by mouth 2 (two) times daily. Patient not taking: Reported on 02/03/2019 05/10/17   Armandina Gemma, MD  docusate sodium (COLACE) 100 MG capsule Take 1 capsule (100 mg total) by mouth 2 (two) times daily as needed (take to keep stool soft.). Patient not taking: Reported on 02/03/2019 05/21/14   Ardis Hughs, MD  traMADol (ULTRAM) 50 MG tablet Take 1-2 tablets (50-100 mg total) by mouth every 6 (six) hours as needed for moderate pain (mild pain not  responsive to acetaminophen). Patient not taking: Reported on 02/03/2019 05/10/17   Armandina Gemma, MD    Inpatient Medications: Scheduled Meds: . buPROPion  200 mg Oral Daily  . busPIRone  15 mg Oral BID  . Chlorhexidine Gluconate Cloth  6 each Topical Q0600  . dexamethasone (DECADRON) injection  6 mg Intravenous Q6H  . docusate sodium  100 mg Oral BID  . irbesartan  300 mg Oral Daily  . [START ON 02/07/2019] levothyroxine  62.5 mcg Oral Q0600  . pantoprazole (PROTONIX) IV  40 mg Intravenous QHS  . pramipexole  0.5 mg Oral QHS  . rosuvastatin  10 mg Oral QHS  . senna  1 tablet Oral BID  . sertraline  100 mg Oral Daily   Continuous Infusions: . sodium chloride Stopped (02/04/19 2010)  . sodium chloride 200 mL/hr at 02/04/19 1557  . sodium chloride 75 mL/hr at 02/06/19 0700  . levETIRAcetam Stopped (02/05/19 2318)   PRN Meds: acetaminophen **OR** acetaminophen, bisacodyl, doxylamine (Sleep), hydrALAZINE, HYDROcodone-acetaminophen, HYDROmorphone (DILAUDID) injection, labetalol, naLOXone (NARCAN)  injection, ondansetron **OR** ondansetron (ZOFRAN) IV, oxyCODONE, polyethylene glycol, promethazine, sodium phosphate, traZODone  Allergies:    Allergies  Allergen Reactions  . Penicillins Hives    [place]. Has tolerated cephazolin and cephalexin. Has patient had a PCN reaction causing immediate rash, facial/tongue/throat swelling, SOB or lightheadedness with hypotension: Yes (per pt report) Has patient had a PCN reaction causing severe rash involving mucus membranes or skin necrosis: Yes Has patient had a PCN reaction that required hospitalization: No Has patient had a PCN reaction occurring within the last 10 years: No If all of the above answers are "NO", then may proceed with Cephalosporin  . Aspirin     Cannot take due to nephrectomy  . Keppra [Levetiracetam] Nausea Only    Anxiety   . Nsaids     Cannot take due to nephrectomy  . Requip [Ropinirole Hcl]     Headaches, restlessness     Social History:   Social History   Socioeconomic History  . Marital status: Married    Spouse name: Gershon Mussel  . Number of children: 3  . Years of education: 30  . Highest education level: Not on file  Occupational History  . Occupation: nurse  Tobacco Use  . Smoking status: Former Smoker    Packs/day: 1.00    Types: Cigarettes    Quit date: 05/03/2007    Years since quitting: 11.7  . Smokeless tobacco: Never Used  Substance and Sexual Activity  . Alcohol use: Yes    Comment: occ  . Drug use: No  . Sexual activity: Yes  Other Topics Concern  . Not on file  Social History Narrative   Lives with husband   Caffeine use: 1 cup coffee per day   Left handed    Social Determinants of Health   Financial Resource Strain:   . Difficulty of Paying Living Expenses: Not on file  Food Insecurity:   . Worried About Charity fundraiser in the Last Year: Not on file  . Ran Out of Food in the Last Year: Not on file  Transportation Needs:   . Lack of Transportation (Medical): Not on file  . Lack of Transportation (Non-Medical): Not on file  Physical Activity:   . Days of Exercise per Week: Not on file  . Minutes of Exercise per Session: Not on file  Stress:   . Feeling of Stress : Not on file  Social Connections:   . Frequency of Communication with Friends and Family: Not on file  . Frequency of Social Gatherings with Friends and Family: Not on file  . Attends Religious Services: Not on file  . Active Member of Clubs or Organizations: Not on file  . Attends Archivist Meetings: Not on file  . Marital Status: Not on file  Intimate Partner Violence:   . Fear of Current or Ex-Partner: Not on file  . Emotionally Abused: Not on file  . Physically Abused: Not on file  . Sexually Abused: Not on file    Family History:   Family History  Problem Relation Age of Onset  . Cancer Mother   . Heart attack Father   . Diabetes Other   .  Dementia Neg Hx    Family Status:    Family Status  Relation Name Status  . Mother  Deceased  . Father  Deceased  . Other  (Not Specified)  . Neg Hx  (Not Specified)    ROS:  Please see the history of present illness.  All other ROS reviewed and negative.     Physical Exam/Data:   Vitals:   02/06/19 0500 02/06/19 0600 02/06/19 0627 02/06/19 0700  BP: 119/67 (!) 164/65 138/67 139/77  Pulse: (!) 42 (!) 41  (!) 43  Resp: 10 12  11   Temp:      TempSrc:      SpO2: 94% 96%  96%  Weight:      Height:        Intake/Output Summary (Last 24 hours) at 02/06/2019 0858 Last data filed at 02/06/2019 0700 Gross per 24 hour  Intake 1945.46 ml  Output 1455 ml  Net 490.46 ml   Filed Weights   02/04/19 1152  Weight: 65 kg   Body mass index is 23.13 kg/m.  General:  Well nourished, well developed, female in no acute distress HEENT: normal Lymph: no adenopathy Neck: JVD -not elevated Endocrine:  No thryomegaly Vascular: No carotid bruits; 4/4 extremity pulses 2+  Cardiac:  normal S1, S2; RRR; no murmur Lungs:  clear bilaterally, no wheezing, rhonchi or rales  Abd: soft, nontender, no hepatomegaly  Ext: no edema Musculoskeletal:  No deformities, BUE and BLE strength not able to be tested Skin: warm and dry  Neuro:  no focal abnormalities noted, not moving much or responding to commands right now  EKG:  The EKG was personally reviewed and demonstrates: 12/22 ECG is sinus rhythm, heart rate 61, no acute ischemic changes, no significant change from 04/2017 ECG when her heart rate was 54 Telemetry:  Telemetry was personally reviewed and demonstrates: Sinus bradycardia, sustaining in the high 30s/low 40s and occasionally dropping down into the low 30s, largely secondary to nonconducted PACs with associated pauses of approximately 2.3 seconds   CV studies:   ECHO: 10/23/2013 - Left ventricle: The cavity size was normal. Wall thickness was  increased in a pattern of mild LVH. Systolic function was normal.  The  estimated ejection fraction was in the range of 60% to 65%.  Wall motion was normal; there were no regional wall motion  abnormalities. Features are consistent with a pseudonormal left  ventricular filling pattern, with concomitant abnormal relaxation  and increased filling pressure (grade 2 diastolic dysfunction).  - Aortic valve: There was no stenosis.  - Mitral valve: Mildly calcified annulus. There was trivial  regurgitation.  - Right ventricle: The cavity size was normal. Systolic function  was normal.  - Pulmonary arteries: No complete TR doppler jet so unable to  estimate PA systolic pressure.  - Inferior vena cava: The vessel was normal in size. The  respirophasic diameter changes were in the normal range (>= 50%),  consistent with normal central venous pressure.   Impressions:   - Normal LV size with mild LV hypertrophy. EF 60-65%. Moderate  diastolic dysfunction. Normal RV size and systolic function. No  significant valvular abnormalities.   Laboratory Data:   Chemistry Recent Labs  Lab 02/04/19 0342 02/04/19 1251 02/05/19 0653 02/06/19 0424  NA 135 138 138 138  K 3.4* 3.3* 3.5 3.4*  CL 100  --  108 107  CO2 25  --  21* 20*  GLUCOSE 130*  --  115* 135*  BUN 16  --  16 16  CREATININE 0.92  --  0.87 0.81  CALCIUM 9.6  --  9.5 9.3  GFRNONAA >60  --  >60 >60  GFRAA >60  --  >60 >60  ANIONGAP 10  --  9 11    Lab Results  Component Value Date   ALT 12 02/03/2019   AST 16 02/03/2019   ALKPHOS 103 02/03/2019   BILITOT 0.5 02/03/2019   Hematology Recent Labs  Lab 02/04/19 1307 02/05/19 0653 02/06/19 0800  WBC 10.5 15.3* 13.6*  RBC 3.00* 3.01* 2.96*  HGB 9.2* 9.2* 9.2*  HCT 28.6* 28.7* 28.4*  MCV 95.3 95.3 95.9  MCH 30.7 30.6 31.1  MCHC 32.2 32.1 32.4  RDW 12.9 13.2 13.2  PLT 309 296 259   Cardiac Enzymes High Sensitivity Troponin:  No results for input(s): TROPONINIHS in the last 720 hours.    BNPNo results for input(s): BNP,  PROBNP in the last 168 hours.  DDimer No results for input(s): DDIMER in the last 168 hours. TSH:  Lab Results  Component Value Date   TSH 75.666 (H) 02/03/2019   Lipids: Lab Results  Component Value Date   CHOL 134 04/03/2014   HDL 41 04/03/2014   LDLCALC 66 04/03/2014   TRIG 137 04/03/2014   CHOLHDL 3.3 04/03/2014   HgbA1c: Lab Results  Component Value Date   HGBA1C 5.6 02/05/2019   Magnesium:  Magnesium  Date Value Ref Range Status  02/04/2019 1.8 1.7 - 2.4 mg/dL Final    Comment:    Performed at [facility], [place] [address] [place], [place] [address]     Radiology/Studies:  DG Chest 1 View  Result Date: 02/03/2019 CLINICAL DATA:  Generalized weakness after falling EXAM: CHEST  1 VIEW COMPARISON:  05/02/2017 FINDINGS: Cardiomediastinal contours are normal accounting for portable technique. Lungs are clear. Postoperative changes are noted in the low neck. Visualized skeletal structures are unremarkable. IMPRESSION: No acute cardiopulmonary disease. Electronically Signed   By: Zetta Bills M.D.   On: 02/03/2019 19:40   DG Forearm Right  Result Date: 02/03/2019 CLINICAL DATA:  Right forearm pain, left hand pain, left shoulder pain and generalized weakness. EXAM: RIGHT FOREARM - 2 VIEW COMPARISON:  None FINDINGS: There is no evidence of fracture or other focal bone lesions. Soft tissues are unremarkable. IMPRESSION: Negative evaluation of the right forearm Electronically Signed   By: Zetta Bills M.D.   On: 02/03/2019 19:39   CT Head Wo Contrast  Result Date: 02/03/2019 CLINICAL DATA:  Weakness and falls over last 3 days, history of renal cell carcinoma EXAM: CT HEAD WITHOUT CONTRAST TECHNIQUE: Contiguous axial images were obtained from the base of the skull through the vertex without intravenous contrast. COMPARISON:  None. FINDINGS: Brain: There is a round hemorrhagic mass seen within the posterior right cerebellum measuring 2.8 cm. The mass a significant  surrounding edema causes mass effect upon the posterior fourth ventricle and left cerebellum. There is also edema extending into the left cerebellum. There is dilatation the ventricles and sulci consistent with age-related atrophy. Low-attenuation changes in the deep white matter consistent with small vessel ischemia. Vascular: No hyperdense vessel or unexpected calcification. Skull: The skull is intact. No fracture or focal lesion identified. Sinuses/Orbits: The visualized paranasal sinuses and mastoid air cells are clear. The orbits and globes intact. Other: None IMPRESSION: Hemorrhagic 2.8 cm mass within the right cerebellum with surrounding vasogenic edema causing mass effect upon the fourth ventricle and left cerebellum. There is dilatation the ventricles  and sulci consistent with age-related atrophy. Low-attenuation changes in the deep white matter consistent with small vessel ischemia. These results were called by telephone at the time of interpretation on 02/03/2019 at 8:46 pm to provider [facility] , who verbally acknowledged these results. Electronically Signed   By: Prudencio Pair M.D.   On: 02/03/2019 20:54   CT Chest W Contrast  Result Date: 02/03/2019 CLINICAL DATA:  Increased weakness and falls for the last 3 days EXAM: CT CHEST, ABDOMEN, AND PELVIS WITH CONTRAST TECHNIQUE: Multidetector CT imaging of the chest, abdomen and pelvis was performed following the standard protocol during bolus administration of intravenous contrast. CONTRAST:  11mL OMNIPAQUE IOHEXOL 350 MG/ML SOLN COMPARISON:  CT chest, abdomen and pelvis 12/02/2015 FINDINGS: CT CHEST FINDINGS Cardiovascular: The aorta is normal caliber. No dissection flap or other acute luminal abnormality of the aorta is seen. No periaortic stranding or hemorrhage. Atherosclerotic plaque within the normal caliber aorta. Normal 3 vessel branching of the arch. Central pulmonary arteries are normal caliber. No large central filling defects on  this non tailored examination. Normal heart size. No pericardial effusion. Mediastinum/Nodes: No mediastinal fluid or hemorrhage. Physiologic amount of fluid present within the pericardial recesses. There is mediastinal and right hilar adenopathy. Findings include a centrally hypoattenuating 1.9 cm subcarinal nodule (4/27) and a 1.4 cm right hilar node with central hypoattenuation (4/27). There is a borderline enlarged right axillary node measuring 10 mm (4/32). Few nodular soft tissue densities in the upper-outer quadrant of the right breast with a more focal cutaneous based lesion in the lower inner quadrant measuring up to 2 cm in size. Lungs/Pleura: There is a multilobulated mass lesion abutting the pleura with acute angular margins in the medial aspect of the right middle lobe. This measures up to 3 cm in size (6/87). Smaller adjacent 9 mm satellite nodule is noted (6/76 few additional satellite nodules are noted adjacent the right hilar vessels and bronchi. Subpleural 5 mm nodule noted in the lateral left upper lobe (6/64). No acute traumatic abnormality of the lung parenchyma. No consolidation, features of edema, pneumothorax, or effusion. Musculoskeletal: Dedicated thoracic spine reconstructions were generated, please see that report for further details of the spine. No visible displaced rib fractures. No suspicious osseous lesions in the chest CT ABDOMEN PELVIS FINDINGS Hepatobiliary: No direct hepatic injury or perihepatic hematoma. Subcentimeter hypoattenuating focus in the posterior right lobe liver (3/2) is too small to fully characterize on CT imaging. No focal liver abnormality is seen. No gallstones, gallbladder wall thickening, or biliary dilatation. Pancreas: Unremarkable. No pancreatic ductal dilatation or surrounding inflammatory changes. Spleen: Normal in size without focal abnormality. Adrenals/Urinary Tract: Irregular centrally hypoattenuating 3.3 cm lesion in the left adrenal gland.  Additional hypertense 1.6 cm nodule in the lateral limb right adrenal gland. No convincing adrenal hemorrhage is seen however patient appears to be post left nephrectomy with some surgical material in the left nephrectomy bed. No right renal injury or perirenal hemorrhage. No suspicious renal lesion. Normal enhancement and excretion without extravasation of contrast from the collecting system on urinary phase delays. Mild bladder wall thickening. Faint perivesicular haze. No direct bladder injury. Stomach/Bowel: Distal esophagus, stomach and duodenal sweep are unremarkable. No small bowel wall thickening or dilatation. No evidence of obstruction. A normal appendix is visualized. No colonic dilatation or wall thickening. No sites of mesenteric hematoma or contusion. Vascular/Lymphatic: No direct vascular injury in the abdomen or pelvis. Few prominent retroperitoneal nodes are noted including a 8 mm periaortic lymph node (4/72).  Reproductive: Patient is post hysterectomy. No concerning adnexal lesions. Postsurgical changes noted in the low anterior abdomen Other: Mild body wall edema. No large body wall hematoma. No bowel containing hernia. No traumatic abdominal wall injury or dehiscence. Musculoskeletal: Dedicated lumbar spine reconstructions were generated, please see that report for further details. No abnormalities of the bony pelvis. No intramuscular hematoma or other acute muscular abnormality is seen. Slight age related atrophy is noted. IMPRESSION: 1. Compression deformities at T12 and L3, better detailed dedicated thoracolumbar spine reconstructions. Please see that report for further details. 2. No other evidence of acute traumatic injury to the chest, abdomen or pelvis. 3. Lobulated mass lesion in the medial aspect of the right middle lobe with adjacent satellite nodules and mediastinal and right hilar adenopathy. 4. Soft tissue nodule in the lower inner quadrant of the right breast with additional  nodularity in the upper-outer quadrant of the same breast. Should correlate with mammography and dedicated breast imaging. 5. Bilateral adrenal masses concerning for metastatic disease. 6. Subcentimeter hypoattenuating focus in the right lobe liver as well as some prominent though nonenlarged lymph nodes, while these are typically characterized as benign incidental findings, should be viewed with some suspicion given the findings above. 7. Mild bladder wall thickening with subtle perivesicular haze, which may represent cystitis. 8. Prior left nephrectomy. 9.  Aortic Atherosclerosis (ICD10-I70.0). These results were called by telephone at the time of interpretation on 02/03/2019 at 9:12 pm to provider [facility] , who verbally acknowledged these results. Electronically Signed   By: Lovena Le M.D.   On: 02/03/2019 21:14   CT Cervical Spine Wo Contrast  Result Date: 02/03/2019 CLINICAL DATA:  Fall. EXAM: CT CERVICAL SPINE WITHOUT CONTRAST TECHNIQUE: Multidetector CT imaging of the cervical spine was performed without intravenous contrast. Multiplanar CT image reconstructions were also generated. COMPARISON:  MRI cervical spine 12/27/2011 FINDINGS: Alignment: Normal Skull base and vertebrae: Negative for fracture Soft tissues and spinal canal: Negative for soft tissue mass or swelling. Prior thyroidectomy. Disc levels: Disc degeneration and spurring multiple levels. Bilateral foraminal stenosis at C3-4, C4-5, C5-6, C6-7 due to spurring. Upper chest: Lung apices clear bilaterally. Other: There is mass-effect on the posterior fossa with effacement of the fourth ventricle and obstructive hydrocephalus. There is question of high-density subdural blood in the right posterior fossa. This area is not well evaluated due to streak artifact. Recommend stat head CT. IMPRESSION: Abnormal mass-effect in the posterior fossa with obstructive hydrocephalus. Question subdural hematoma or mass in the posterior fossa on  the right. Recommend stat head CT. Cervical spondylosis without fracture These results were called by telephone at the time of interpretation on 02/03/2019 at 8:49 pm to provider [facility] , who verbally acknowledged these results. Electronically Signed   By: Franchot Gallo M.D.   On: 02/03/2019 20:50   MR BRAIN W WO CONTRAST  Result Date: 02/05/2019 CLINICAL DATA:  Post craniotomy EXAM: MRI HEAD WITHOUT AND WITH CONTRAST TECHNIQUE: Multiplanar, multiecho pulse sequences of the brain and surrounding structures were obtained without and with intravenous contrast. CONTRAST:  78mL GADAVIST GADOBUTROL 1 MMOL/ML IV SOLN COMPARISON:  Post craniotomy FINDINGS: Brain: There are new postoperative changes of right suboccipital craniotomy for resection of dominant right cerebellar mass. Resection cavity contains air, fluid, and blood products with minimal, likely postsurgical enhancement at the margins. Mild reduced diffusion at the cavity margins likely reflects postoperative contusion. Surrounding edema remains similar. There is persistent mass-effect on the fourth ventricle with resulting similar hydrocephalus. Additional enhancing lesions  and associated edema are stable over the short interval. Foci of T2 hyperintensity in the supratentorial white matter likely reflect a combination chronic microvascular ischemic changes and is digital edema related to hydrocephalus. There are scattered extra-axial foci of susceptibility likely reflecting pneumocephalus. Vascular: Major vessel flow voids at the skull base are preserved. Skull and upper cervical spine: There is abnormal T1 marrow signal eccentric to the right at C3 and possibly C4. Sinuses/Orbits: Paranasal sinuses are aerated. Orbits are unremarkable. Other: Sella is unremarkable.  Mastoid air cells are clear. 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 Associated mass effect and hydrocephalus are  similar. Stable additional metastatic lesions. Abnormal marrow signal the upper cervical spine suspicious for metastatic disease. Electronically Signed   By: Macy Mis M.D.   On: 02/05/2019 08:35   MR BRAIN W WO CONTRAST  Result Date: 02/04/2019 CLINICAL DATA:  Occurrence falls and weakness. History of multiple carcinomas. EXAM: MRI HEAD WITHOUT AND WITH CONTRAST TECHNIQUE: Multiplanar, multiecho pulse sequences of the brain and surrounding structures were obtained without and with intravenous contrast. CONTRAST:  90mL GADAVIST GADOBUTROL 1 MMOL/ML IV SOLN COMPARISON:  Head CT 02/03/2019 FINDINGS: Brain: There is a lesion of the right cerebellar hemisphere with nodular peripheral contrast enhancement, measuring 2.8 x 2.6 cm. There is associated hemorrhage is demonstrated on the earlier head CT. This causes moderate edema in the right cerebellar hemisphere with mass effect on the fourth ventricle. There is also a lesion of the superior cerebellar vermis that measures 1.5 x 1.4 cm. There are subcentimeter lesions of the left deep gray nuclei, left occipital lobe, left parietal white matter and left cerebellum. Early confluent hyperintense T2-weighted signal of the periventricular and deep white matter, most commonly due to chronic ischemic microangiopathy. Advanced atrophy for age. Blood-sensitive sequences show a single focus of chronic microhemorrhage in the right frontal white matter. The midline structures are normal. Vascular: Normal flow voids. Skull and upper cervical spine: Normal marrow signal. Sinuses/Orbits: Negative. Other: None. IMPRESSION: 1. Seven intracranial metastatic lesions, the largest of which is in the right cerebellar hemisphere and measures up to 2.6 cm with associated hemorrhage and moderate mass effect on the fourth ventricle. 2. Advanced atrophy and chronic ischemic microangiopathy. Lesions are annotated on series 11, axial postcontrast T1-weighted imaging. Electronically Signed    By: Ulyses Jarred M.D.   On: 02/04/2019 01:28   CT ABDOMEN PELVIS W CONTRAST  Result Date: 02/03/2019 CLINICAL DATA:  Increased weakness and falls for the last 3 days EXAM: CT CHEST, ABDOMEN, AND PELVIS WITH CONTRAST TECHNIQUE: Multidetector CT imaging of the chest, abdomen and pelvis was performed following the standard protocol during bolus administration of intravenous contrast. CONTRAST:  18mL OMNIPAQUE IOHEXOL 350 MG/ML SOLN COMPARISON:  CT chest, abdomen and pelvis 12/02/2015 FINDINGS: CT CHEST FINDINGS Cardiovascular: The aorta is normal caliber. No dissection flap or other acute luminal abnormality of the aorta is seen. No periaortic stranding or hemorrhage. Atherosclerotic plaque within the normal caliber aorta. Normal 3 vessel branching of the arch. Central pulmonary arteries are normal caliber. No large central filling defects on this non tailored examination. Normal heart size. No pericardial effusion. Mediastinum/Nodes: No mediastinal fluid or hemorrhage. Physiologic amount of fluid present within the pericardial recesses. There is mediastinal and right hilar adenopathy. Findings include a centrally hypoattenuating 1.9 cm subcarinal nodule (4/27) and a 1.4 cm right hilar node with central hypoattenuation (4/27). There is a borderline enlarged right axillary node measuring 10 mm (4/32). Few nodular  soft tissue densities in the upper-outer quadrant of the right breast with a more focal cutaneous based lesion in the lower inner quadrant measuring up to 2 cm in size. Lungs/Pleura: There is a multilobulated mass lesion abutting the pleura with acute angular margins in the medial aspect of the right middle lobe. This measures up to 3 cm in size (6/87). Smaller adjacent 9 mm satellite nodule is noted (6/76 few additional satellite nodules are noted adjacent the right hilar vessels and bronchi. Subpleural 5 mm nodule noted in the lateral left upper lobe (6/64). No acute traumatic abnormality of the lung  parenchyma. No consolidation, features of edema, pneumothorax, or effusion. Musculoskeletal: Dedicated thoracic spine reconstructions were generated, please see that report for further details of the spine. No visible displaced rib fractures. No suspicious osseous lesions in the chest CT ABDOMEN PELVIS FINDINGS Hepatobiliary: No direct hepatic injury or perihepatic hematoma. Subcentimeter hypoattenuating focus in the posterior right lobe liver (3/2) is too small to fully characterize on CT imaging. No focal liver abnormality is seen. No gallstones, gallbladder wall thickening, or biliary dilatation. Pancreas: Unremarkable. No pancreatic ductal dilatation or surrounding inflammatory changes. Spleen: Normal in size without focal abnormality. Adrenals/Urinary Tract: Irregular centrally hypoattenuating 3.3 cm lesion in the left adrenal gland. Additional hypertense 1.6 cm nodule in the lateral limb right adrenal gland. No convincing adrenal hemorrhage is seen however patient appears to be post left nephrectomy with some surgical material in the left nephrectomy bed. No right renal injury or perirenal hemorrhage. No suspicious renal lesion. Normal enhancement and excretion without extravasation of contrast from the collecting system on urinary phase delays. Mild bladder wall thickening. Faint perivesicular haze. No direct bladder injury. Stomach/Bowel: Distal esophagus, stomach and duodenal sweep are unremarkable. No small bowel wall thickening or dilatation. No evidence of obstruction. A normal appendix is visualized. No colonic dilatation or wall thickening. No sites of mesenteric hematoma or contusion. Vascular/Lymphatic: No direct vascular injury in the abdomen or pelvis. Few prominent retroperitoneal nodes are noted including a 8 mm periaortic lymph node (4/72). Reproductive: Patient is post hysterectomy. No concerning adnexal lesions. Postsurgical changes noted in the low anterior abdomen Other: Mild body wall  edema. No large body wall hematoma. No bowel containing hernia. No traumatic abdominal wall injury or dehiscence. Musculoskeletal: Dedicated lumbar spine reconstructions were generated, please see that report for further details. No abnormalities of the bony pelvis. No intramuscular hematoma or other acute muscular abnormality is seen. Slight age related atrophy is noted. IMPRESSION: 1. Compression deformities at T12 and L3, better detailed dedicated thoracolumbar spine reconstructions. Please see that report for further details. 2. No other evidence of acute traumatic injury to the chest, abdomen or pelvis. 3. Lobulated mass lesion in the medial aspect of the right middle lobe with adjacent satellite nodules and mediastinal and right hilar adenopathy. 4. Soft tissue nodule in the lower inner quadrant of the right breast with additional nodularity in the upper-outer quadrant of the same breast. Should correlate with mammography and dedicated breast imaging. 5. Bilateral adrenal masses concerning for metastatic disease. 6. Subcentimeter hypoattenuating focus in the right lobe liver as well as some prominent though nonenlarged lymph nodes, while these are typically characterized as benign incidental findings, should be viewed with some suspicion given the findings above. 7. Mild bladder wall thickening with subtle perivesicular haze, which may represent cystitis. 8. Prior left nephrectomy. 9.  Aortic Atherosclerosis (ICD10-I70.0). These results were called by telephone at the time of interpretation on 02/03/2019 at 9:12 pm  to provider Marda Stalker , who verbally acknowledged these results. Electronically Signed   By: Lovena Le M.D.   On: 02/03/2019 21:14   CT T-SPINE NO CHARGE  Result Date: 02/03/2019 CLINICAL DATA:  Increasing weakness and multiple falls for 3 days EXAM: CT THORACIC AND LUMBAR SPINE WITHOUT CONTRAST TECHNIQUE: Multiplanar CT images of the thoracic and lumbar spine were reconstructed  from contemporary CT of the Chest, abdomen and pelvis. CONTRAST:  None or No additional COMPARISON:  Chest radiograph 02/03/2019 CT abdomen pelvis 05/04/2015, chest radiograph 05/02/2017, lumbar radiographs 10/13/2018 FINDINGS: THORACIC SPINE: Alignment: There is straightening of the normal thoracic kyphosis throughout the lower thoracic levels with focal kyphotic curvature at the level of the T12 compression deformity. Mild dextrocurvature throughout the thoracic spine with an apex at the T11 level. No traumatic listhesis. No abnormal widened, jumped or perched facets. Vertebrae: There is multilevel Schmorl's node formations. Anterior wedging compression deformity of T12 (AO spine A1) with approximately 50% height loss anteriorly some persistent lucency suggesting acuity. No other acute osseous injury or compression deformity is seen in the thoracic spine. No suspicious osseous lesions. Paraspinal and other soft tissues: Small amount paravertebral soft tissue thickening/stranding adjacent the T12 compression deformity. Finding favors acuity. For findings within the posterior chest and mediastinum, see dedicated CT from which this study is reconstructed. Disc levels: There is diffuse multilevel intervertebral disc height loss with discogenic endplate changes and some posterior disc osteophyte formation at T4-T5, T5-T6 and T7-T8 but without significant resulting canal stenosis or neural foraminal narrowing in the imaged thoracic spine. LUMBAR SPINE: Segmentation: 5 non-rib-bearing lumbar type vertebral levels. Alignment: There is slight levocurvature of the lumbar spine with an apex at L3. No abnormally widened, jumped or perched facets. Vertebrae: There is a split/pincer type compression deformity of L3 (AO spine A2). Central height loss of approximately 50%. No abnormal posterior wall involvement or retropulsion of fracture fragments. No additional acute fracture is seen. Posterior elements are intact. No  suspicious osseous lesions. Multilevel Schmorl's node formations seen in the lumbar spine. Paraspinal and other soft tissues: Small amount of paravertebral soft tissue thickening at the L3 level suggests acuity. No visible canal hematoma. For findings in the posterior abdomen and pelvis, see dedicated CT from which this study is reconstructed. Disc levels: Multilevel intervertebral disc height loss throughout the lumbar spine with multilevel global disc bulges. Central disc protrusions at L2 and L3 result in mild canal stenosis. A left central disc protrusion at L1 results in more moderate canal narrowing. Facet hypertrophic changes are maximal at L5-S1 with resulting mild to moderate multilevel neural foraminal narrowing the lumbar spine. IMPRESSION: 1. Anterior wedging compression deformity T12 with 50% height loss anteriorly (AOSpineA1). 2. Split/Pincer type compression deformity of L3 with approximately 50% height loss anteriorly (AOSpine A2). 3. Faint paraspinal soft tissue thickening adjacent both these deformities suggest acuity. 4. No other acute osseous injuries in the thoracic or lumbar spine. 5. Multilevel degenerative changes of the thoracic and lumbar spine, as described above. 6. For findings in the posterior chest, abdomen and pelvis, see dedicated CT from which this study is reconstructed. Electronically Signed   By: Lovena Le M.D.   On: 02/03/2019 20:56   CT L-SPINE NO CHARGE  Result Date: 02/03/2019 CLINICAL DATA:  Increasing weakness and multiple falls for 3 days EXAM: CT THORACIC AND LUMBAR SPINE WITHOUT CONTRAST TECHNIQUE: Multiplanar CT images of the thoracic and lumbar spine were reconstructed from contemporary CT of the Chest, abdomen and pelvis. CONTRAST:  None or No additional COMPARISON:  Chest radiograph 02/03/2019 CT abdomen pelvis 05/04/2015, chest radiograph 05/02/2017, lumbar radiographs 10/13/2018 FINDINGS: THORACIC SPINE: Alignment: There is straightening of the normal  thoracic kyphosis throughout the lower thoracic levels with focal kyphotic curvature at the level of the T12 compression deformity. Mild dextrocurvature throughout the thoracic spine with an apex at the T11 level. No traumatic listhesis. No abnormal widened, jumped or perched facets. Vertebrae: There is multilevel Schmorl's node formations. Anterior wedging compression deformity of T12 (AO spine A1) with approximately 50% height loss anteriorly some persistent lucency suggesting acuity. No other acute osseous injury or compression deformity is seen in the thoracic spine. No suspicious osseous lesions. Paraspinal and other soft tissues: Small amount paravertebral soft tissue thickening/stranding adjacent the T12 compression deformity. Finding favors acuity. For findings within the posterior chest and mediastinum, see dedicated CT from which this study is reconstructed. Disc levels: There is diffuse multilevel intervertebral disc height loss with discogenic endplate changes and some posterior disc osteophyte formation at T4-T5, T5-T6 and T7-T8 but without significant resulting canal stenosis or neural foraminal narrowing in the imaged thoracic spine. LUMBAR SPINE: Segmentation: 5 non-rib-bearing lumbar type vertebral levels. Alignment: There is slight levocurvature of the lumbar spine with an apex at L3. No abnormally widened, jumped or perched facets. Vertebrae: There is a split/pincer type compression deformity of L3 (AO spine A2). Central height loss of approximately 50%. No abnormal posterior wall involvement or retropulsion of fracture fragments. No additional acute fracture is seen. Posterior elements are intact. No suspicious osseous lesions. Multilevel Schmorl's node formations seen in the lumbar spine. Paraspinal and other soft tissues: Small amount of paravertebral soft tissue thickening at the L3 level suggests acuity. No visible canal hematoma. For findings in the posterior abdomen and pelvis, see  dedicated CT from which this study is reconstructed. Disc levels: Multilevel intervertebral disc height loss throughout the lumbar spine with multilevel global disc bulges. Central disc protrusions at L2 and L3 result in mild canal stenosis. A left central disc protrusion at L1 results in more moderate canal narrowing. Facet hypertrophic changes are maximal at L5-S1 with resulting mild to moderate multilevel neural foraminal narrowing the lumbar spine. IMPRESSION: 1. Anterior wedging compression deformity T12 with 50% height loss anteriorly (AOSpineA1). 2. Split/Pincer type compression deformity of L3 with approximately 50% height loss anteriorly (AOSpine A2). 3. Faint paraspinal soft tissue thickening adjacent both these deformities suggest acuity. 4. No other acute osseous injuries in the thoracic or lumbar spine. 5. Multilevel degenerative changes of the thoracic and lumbar spine, as described above. 6. For findings in the posterior chest, abdomen and pelvis, see dedicated CT from which this study is reconstructed. Electronically Signed   By: Lovena Le M.D.   On: 02/03/2019 20:56   DG Shoulder Left  Result Date: 02/03/2019 CLINICAL DATA:  Shoulder pain EXAM: LEFT SHOULDER - 2+ VIEW COMPARISON:  None FINDINGS: There is no evidence of fracture or dislocation. There is no evidence of arthropathy or other focal bone abnormality. Soft tissues are unremarkable. IMPRESSION: Negative evaluation of the left shoulder. Electronically Signed   By: Zetta Bills M.D.   On: 02/03/2019 19:43   DG Hand Complete Left  Result Date: 02/03/2019 CLINICAL DATA:  Hand pain EXAM: LEFT HAND - COMPLETE 3+ VIEW COMPARISON:  None FINDINGS: Degenerative changes at the first carpometacarpal joint. No signs of fracture or dislocation. Soft tissues are unremarkable. IMPRESSION: Degenerative changes without fracture. Electronically Signed   By: Jewel Baize.D.  On: 02/03/2019 19:42    Assessment and Plan:   1.  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 -However, the underlying ventricular rate did not change and was generally in the 50s. -She did have some pauses greater than 2 seconds that are likely secondary to nonconducted PACs. -However, heart rate below 35 was not sustained -Her heart rate is sometimes in the 50s and 60s, so no convincing evidence of chronotropic incompetence. -She has not had any hypotension, despite her low heart rate. -At this time, no indication for pacemaker  Otherwise, per IM and Neurosurgery Active Problems:   Essential (primary) hypertension   Brain tumor ([place])   Hypokalemia   Hypothyroidism     For questions or updates, please contact [facility] Please consult [email or web address] for contact info under Cardiology/STEMI.   Signed, Rosaria Ferries, PA-C  02/06/2019 8:58 AM  The patient was seen and examined, and I agree with the history, physical exam, assessment and plan as documented above, with modifications as noted below. I have also personally reviewed all relevant documentation, old records, labs, and both radiographic and cardiovascular studies. I have also independently interpreted old and new ECG's.  Briefly, this is a 63 yr old woman with multiple malignancies and widely metastatic disease with resent resection of large right cerebellar mass. We are consulted for tachycardia and bradycardia.  At time of my exam, pt is in sinus bradycardia, 40 bpm range.  Extensive review of telemetry demonstrates no significant tachycardia; rather, there is extensive artifact with actual ventricular rates during those episodes in 50 bpm range.  There may be bursts of atrial flutter (with controlled HR's) but these are short-lived. There have been no significant pauses. HR's  in 30 bpm range were also short-lived and bradycardia would be expected in someone with recent neurosurgery.  No indication for any cardiac workup.  Will sign off.  She denies chest pain and palpitations.   Kate Sable, MD, [facility]  02/06/2019 11:34 AM

## 2019-02-06 NOTE — Progress Notes (Signed)
Patient ID: Krista Hodges, female   DOB: 01-Nov-1955, 63 y.o.   MRN: [id number] BP 130/73   Pulse (!) 53   Temp 97.6 F (36.4 C) (Oral)   Resp 16   Ht 5\' 6"  (1.676 m)   Wt 65 kg   SpO2 97%   BMI 23.13 kg/m  Alert and oriented x 4 Wound is clean, dry, no signs of infection Doing better Remain in unit today. Moving all extremities well

## 2019-02-07 NOTE — Progress Notes (Signed)
Physical Therapy Treatment Patient Details Name: DEMETRIC QUICK MRN: [id number] DOB: 1955/07/26 Today's Date: 02/07/2019    History of Present Illness 63 yo admitted with falls and weakness found to have T12 and L3 fx to be treated with TLSO for OOB and cerebellar mass s/p craniotomy with MRI also demonstrating multiple breast, lung, adrenal and possible liver mass. PMHx: HTN, depression, HLD and multiple cancers including ?RCC s/p nephrectomy around 2015, endometrial adenocarcinoma 2017, papillary thyroid carcinoma 2019 s/p radiation. Pt also recently diagnosed with dementia.    PT Comments    Pt with improved ambulation tolerance today amb 120' with RW and minAx2. Pt continues to have impaired processing and sequencing, impaired memory, doesn't remember back precautions or the need for the TLSO. Braces front piece doesn't have velcro attached, [employer] contacted. Pt continues to require 24/7 assist for safe mobility and due to impaired cognition. If this can not be provided pt will need SNF upon d/c. Acute PT to cont to follow.    Follow Up Recommendations  Home health PT;Supervision/Assistance - 24 hour;SNF     Equipment Recommendations  3in1 (PT)    Recommendations for Other Services OT consult     Precautions / Restrictions Precautions Precautions: Fall;Back Precaution Comments: diplopia Required Braces or Orthoses: Spinal Brace Spinal Brace: Thoracolumbosacral orthotic;Applied in sitting position Restrictions Weight Bearing Restrictions: No    Mobility  Bed Mobility Overal bed mobility: Needs Assistance Bed Mobility: Rolling;Supine to Sit Rolling: Mod assist Sidelying to sit: Mod assist       General bed mobility comments: pt with urgency to go to the bathroom and impulsively trying to get out of bed despite multiple lines and needing to put on TLSO  Transfers Overall transfer level: Needs assistance Equipment used: Rolling walker (2 wheeled) Transfers: Sit to/from  Stand Sit to Stand: Mod assist;+2 physical assistance         General transfer comment: Pt requires (A) to power up and then progressing with RW toward bathroom  Ambulation/Gait Ambulation/Gait assistance: Mod assist;+2 safety/equipment Gait Distance (Feet): 120 Feet Assistive device: Rolling walker (2 wheeled) Gait Pattern/deviations: Step-through pattern;Decreased stride length;Wide base of support Gait velocity: slow Gait velocity interpretation: <1.8 ft/sec, indicate of risk for recurrent falls General Gait Details: pt with short step length and height, minA to keep walker close to patient, max directional verbal cues to stay in walker as well, daughter in law pushed chair behind. pt with 4 standing rest breaks   Stairs             Wheelchair Mobility    Modified Rankin (Stroke Patients Only)       Balance Overall balance assessment: Needs assistance Sitting-balance support: Bilateral upper extremity supported Sitting balance-Leahy Scale: Fair Sitting balance - Comments: minguard sitting EOb   Standing balance support: Bilateral upper extremity supported Standing balance-Leahy Scale: Poor Standing balance comment: reliance on RW                            Cognition Arousal/Alertness: Awake/alert Behavior During Therapy: Flat affect Overall Cognitive Status: Impaired/Different from baseline Area of Impairment: Orientation;Attention;Memory;Safety/judgement;Following commands;Awareness;Problem solving                 Orientation Level: Disoriented to;Place;Time;Situation(pt kept thinking she was at home) Current Attention Level: Sustained Memory: Decreased recall of precautions;Decreased short-term memory Following Commands: Follows one step commands with increased time;Follows one step commands consistently;Follows multi-step commands inconsistently Safety/Judgement: Decreased awareness of safety;Decreased awareness  of deficits Awareness:  Emergent(asked to go to bathrrom) Problem Solving: Slow processing;Decreased initiation;Difficulty sequencing General Comments: max directional verbal and tactile cues to complete task and for safety      Exercises      General Comments General comments (skin integrity, edema, etc.): pt went to bathroom with OT. pt with posterior neck incision, no drainage.      Pertinent Vitals/Pain Pain Assessment: No/denies pain    Home Living Family/patient expects to be discharged to:: Private residence Living Arrangements: Spouse/significant other Available Help at Discharge: Family;Available 24 hours/day Type of Home: Apartment Home Access: Level entry   Home Layout: One level Home Equipment: Wheelchair - [employer] - 2 wheels;Cane - single point;Grab bars - tub/shower Additional Comments: daughter in law present and will be able to help at home. She thinks they have a 3n1 at home but uncertain    Prior Function Level of Independence: Needs assistance  Gait / Transfers Assistance Needed: was walking with assist grossly 20' then unable to walk for 2 weeks PTA ADL's / Homemaking Assistance Needed: bathing and dressing on her own until 2 weeks PtA and since that time assist for all     PT Goals (current goals can now be found in the care plan section) Acute Rehab PT Goals Patient Stated Goal: home Progress towards PT goals: Progressing toward goals    Frequency    Min 4X/week      PT Plan Current plan remains appropriate    Co-evaluation   Reason for Co-Treatment: For patient/therapist safety   OT goals addressed during session: ADL's and self-care;Strengthening/ROM      AM-PAC PT "6 Clicks" Mobility   Outcome Measure  Help needed turning from your back to your side while in a flat bed without using bedrails?: A Lot Help needed moving from lying on your back to sitting on the side of a flat bed without using bedrails?: A Lot Help needed moving to and from a bed to a  chair (including a wheelchair)?: A Lot Help needed standing up from a chair using your arms (e.g., wheelchair or bedside chair)?: A Lot Help needed to walk in hospital room?: A Lot Help needed climbing 3-5 steps with a railing? : Total 6 Click Score: 11    End of Session Equipment Utilized During Treatment: Gait belt;Back brace Activity Tolerance: Patient tolerated treatment well Patient left: in chair;with call bell/phone within reach;with chair alarm set;with nursing/sitter in room;with family/visitor present Nurse Communication: Mobility status;Precautions PT Visit Diagnosis: Other abnormalities of gait and mobility (R26.89);History of falling (Z91.81);Unsteadiness on feet (R26.81)     Time: 1253-1310 PT Time Calculation (min) (ACUTE ONLY): 17 min  Charges:  $Gait Training: 8-22 mins                     Kittie Plater, PT, DPT Acute Rehabilitation Services Pager #: [phone] Office #: [phone]    Berline Lopes 02/07/2019, 2:30 PM

## 2019-02-07 NOTE — Progress Notes (Signed)
OT NOTE  Call made to [employer] regarding patients brace and fit. The TLSO front pad is the wrong size and falling out of brace due to no adhesive present. Pt must be able to don brace in sitting for proper fit as pt does not have a supine order requirement. OT to follow up with further brace education after [employer] adjustments to the brace for proper fit.    Fleeta Emmer, OTR/L  Acute Rehabilitation Services Pager: [phone] Office: [phone] .

## 2019-02-07 NOTE — Evaluation (Signed)
Occupational Therapy Evaluation Patient Details Name: Krista Hodges MRN: [id number] DOB: May 07, 1955 Today's Date: 02/07/2019    History of Present Illness 63 yo admitted with falls and weakness found to have T12 and L3 fx to be treated with TLSO for OOB and cerebellar mass s/p craniotomy with MRI also demonstrating multiple breast, lung, adrenal and possible liver mass. PMHx: HTN, depression, HLD and multiple cancers including ?RCC s/p nephrectomy around 2015, endometrial adenocarcinoma 2017, papillary thyroid carcinoma 2019 s/p radiation. Pt also recently diagnosed with dementia.   Clinical Impression   Patient is s/p craniotomy surgery resulting in functional limitations due to the deficits listed below (see OT problem list). Pt currently mod (A) with RW for balance with decrease vision and cognitive deficits.  Patient will benefit from skilled OT acutely to increase independence and safety with ADLS to allow discharge [place].     Follow Up Recommendations  Home health OT    Equipment Recommendations  3 in 1 bedside commode;Tub/shower seat    Recommendations for Other Services       Precautions / Restrictions Precautions Precautions: Fall;Back Precaution Comments: diplopia Required Braces or Orthoses: Spinal Brace Spinal Brace: Thoracolumbosacral orthotic;Applied in sitting position      Mobility Bed Mobility Overal bed mobility: Needs Assistance Bed Mobility: Rolling;Supine to Sit Rolling: Mod assist Sidelying to sit: Mod assist       General bed mobility comments: pt attempting to exit the bed on therapist arrival due to need to void bladder. pt requires (A) to to elevate chest from bed level and pushing with R UE. pt static sitting min (A) initially. pt requires mod (A) to seqence task  Transfers Overall transfer level: Needs assistance Equipment used: Rolling walker (2 wheeled) Transfers: Sit to/from Stand Sit to Stand: +2 physical assistance;Mod assist          General transfer comment: Pt requires (A) to power up and then progressing with RW toward bathroom    Balance Overall balance assessment: Needs assistance Sitting-balance support: Bilateral upper extremity supported Sitting balance-Leahy Scale: Fair     Standing balance support: Bilateral upper extremity supported Standing balance-Leahy Scale: Poor Standing balance comment: reliance on RW                           ADL either performed or assessed with clinical judgement   ADL Overall ADL's : Needs assistance/impaired     Grooming: Wash/dry hands;Moderate assistance;Standing Grooming Details (indicate cue type and reason): cues to avoid leaning over at sink level onto elbow Upper Body Bathing: Minimal assistance   Lower Body Bathing: Moderate assistance;Sit to/from stand           Toilet Transfer: +2 for physical assistance;+2 for safety/equipment;Moderate assistance;BSC;RW Toilet Transfer Details (indicate cue type and reason): cues to remain inside the RW and therapist holding RW back to make the patient inside RW. pt states "i can't see" requiring cues for visual deficits Toileting- Clothing Manipulation and Hygiene: Moderate assistance;Sit to/from stand Toileting - Clothing Manipulation Details (indicate cue type and reason): pt able to power up and attmepting to wipe with flexed posture with cues that squating is required     Functional mobility during ADLs: Moderate assistance;Rolling walker General ADL Comments: pt progressed from supine to bathroom level adls. Pt ending session with PT ambulation in the hallway.      Vision   Vision Assessment?: Vision impaired- to be further tested in functional context Additional Comments: pt over and  undershooting during session. pt just verbalizes "i can't see' due to cognitive deficits difficult to fully assess. Functionally pt was abel to locate toilet paper in lower left quadrant. pt required (A) to locate depth to  sink and reaching faucet hands. pt reaching L for soap dispenser but needed hand over hand to get hand high enough for soap     Perception     Praxis      Pertinent Vitals/Pain Pain Assessment: No/denies pain     Hand Dominance Right   Extremity/Trunk Assessment Upper Extremity Assessment Upper Extremity Assessment: Generalized weakness   Lower Extremity Assessment Lower Extremity Assessment: Generalized weakness   Cervical / Trunk Assessment Cervical / Trunk Assessment: Kyphotic Cervical / Trunk Exceptions: s/p craniotomy with TLSO for t12 and L3 compression fx   Communication Communication Communication: No difficulties   Cognition Arousal/Alertness: Awake/alert Behavior During Therapy: Flat affect Overall Cognitive Status: Impaired/Different from baseline Area of Impairment: Orientation;Attention;Memory;Safety/judgement;Following commands;Awareness;Problem solving                 Orientation Level: Disoriented to;Place;Time;Situation(I thought we had gone home) Current Attention Level: Sustained Memory: Decreased recall of precautions;Decreased short-term memory Following Commands: Follows one step commands inconsistently;Follows one step commands with increased time Safety/Judgement: Decreased awareness of safety;Decreased awareness of deficits Awareness: Intellectual Problem Solving: Slow processing;Decreased initiation;Difficulty sequencing General Comments: Pt requires set by step for sequence. pt attempting to exit bed due to need to void bladder and lack awareness to lines. after bathroom transfer pt states "i thought we went home" pt no awareness to hospital admission is present   General Comments  Pt with incision open to air    Exercises     Shoulder Instructions      Home Living Family/patient expects to be discharged to:: Private residence Living Arrangements: Spouse/significant other Available Help at Discharge: Family;Available 24  hours/day Type of Home: Apartment Home Access: Level entry     Home Layout: One level     Bathroom Shower/Tub: Occupational psychologist: Standard     Home Equipment: Wheelchair - [employer] - 2 wheels;Cane - single point;Grab bars - tub/shower   Additional Comments: daughter in law present and will be able to help at home. She thinks they have a 3n1 at home but uncertain      Prior Functioning/Environment Level of Independence: Needs assistance  Gait / Transfers Assistance Needed: was walking with assist grossly 20' then unable to walk for 2 weeks PTA ADL's / Homemaking Assistance Needed: bathing and dressing on her own until 2 weeks PtA and since that time assist for all            OT Problem List: Decreased strength;Decreased activity tolerance;Impaired balance (sitting and/or standing);Decreased cognition;Decreased safety awareness;Decreased knowledge of use of DME or AE;Decreased knowledge of precautions;Cardiopulmonary status limiting activity;Decreased coordination;Impaired vision/perception      OT Treatment/Interventions: Self-care/ADL training;Therapeutic exercise;Neuromuscular education;Energy conservation;DME and/or AE instruction;Manual therapy;Therapeutic activities;Cognitive remediation/compensation;Visual/perceptual remediation/compensation;Patient/family education;Balance training    OT Goals(Current goals can be found in the care plan section) Acute Rehab OT Goals Patient Stated Goal: to return home with family OT Goal Formulation: With patient/family Time For Goal Achievement: 02/21/19 Potential to Achieve Goals: Good  OT Frequency: Min 2X/week   Barriers to D/C:            Co-evaluation PT/OT/SLP Co-Evaluation/Treatment: Yes Reason for Co-Treatment: For patient/therapist safety   OT goals addressed during session: ADL's and self-care;Strengthening/ROM      AM-PAC OT "6 Clicks" Daily  Activity     Outcome Measure Help from another  person eating meals?: A Little Help from another person taking care of personal grooming?: A Little Help from another person toileting, which includes using toliet, bedpan, or urinal?: A Lot Help from another person bathing (including washing, rinsing, drying)?: A Lot Help from another person to put on and taking off regular upper body clothing?: A Little Help from another person to put on and taking off regular lower body clothing?: A Lot 6 Click Score: 15   End of Session Equipment Utilized During Treatment: Gait belt;Rolling walker Nurse Communication: Mobility status;Precautions  Activity Tolerance: Patient tolerated treatment well Patient left: Other (comment)(in hall with PT Ashly)  OT Visit Diagnosis: Unsteadiness on feet (R26.81);Muscle weakness (generalized) (M62.81)                Time: 1244-1300 OT Time Calculation (min): 16 min Charges:  OT General Charges $OT Visit: 1 Visit OT Evaluation $OT Eval Moderate Complexity: 1 Mod   Brynn, OTR/L  Acute Rehabilitation Services Pager: [phone] Office: [phone] .   Jeri Modena 02/07/2019, 1:20 PM

## 2019-02-07 NOTE — Progress Notes (Signed)
   Providing Compassionate, Quality Care - Together   Subjective: Patient reports no issues overnight. Patient has back pain, but this is not new. Nurse reports she just transferred from ICU side and lethargy has improved since increase in steroids.  Objective: Vital signs in last 24 hours: Temp:  [97.5 F (36.4 C)-98.7 F (37.1 C)] 97.5 F (36.4 C) (12/26 0837) Pulse Rate:  [46-69] 56 (12/26 0837) Resp:  [10-17] 17 (12/26 0837) BP: (97-149)/(61-94) 149/75 (12/26 0837) SpO2:  [76 %-99 %] 98 % (12/26 0837)  Intake/Output from previous day: 12/25 0701 - 12/26 0700 In: 980.5 [I.V.:880.5; IV Piggyback:100] Out: 1175 [Urine:1175] Intake/Output this shift: No intake/output data recorded.  Alert and oriented to self and place Speech fluent PERRLA MAE, Strength and sensation intact Incision is clean, dry, and intact  Lab Results: Recent Labs    02/05/19 0653 02/06/19 0800  WBC 15.3* 13.6*  HGB 9.2* 9.2*  HCT 28.7* 28.4*  PLT 296 259   BMET Recent Labs    02/05/19 0653 02/06/19 0424  NA 138 138  K 3.5 3.4*  CL 108 107  CO2 21* 20*  GLUCOSE 115* 135*  BUN 16 16  CREATININE 0.87 0.81  CALCIUM 9.5 9.3    Studies/Results: No results found.  Assessment/Plan: Patient is three days status post suboccipital craniotomy for resection of right-sided cerebellar tumor. Patient's lethargy has improved since increase in decadron yesterday.   LOS: 4 days    -Continue current dose of decadron for now. Consider reducing dose starting tomorrow. -Continue mobilization with therapies. -PT recommending [facility] due to dementia-feels patient would benefit from being in a familiar environment. SNF placement recommended if home program not available   Viona Gilmore, DNP, AGNP-C Nurse Practitioner  [facility] [place]. [address], [address], [place], [place] [address] P: [phone]    F: [phone]  02/07/2019, 9:11 AM

## 2019-02-07 NOTE — Progress Notes (Signed)
PROGRESS NOTE    Krista Hodges  [id number] DOB: [birth year] DOA: 02/03/2019 PCP: Josetta Huddle, MD   Brief Narrative: Krista Hodges is a 63 y.o. 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 Patient presented secondary to worsening confusion and found to have a cerebellar mass with vasogenic edema with concern for metastatic disease.   Assessment & Plan:   Active Problems:   Essential (primary) hypertension   Brain tumor ([place])   Hypokalemia   Hypothyroidism   Cerebellar mass with vasogenic edema Neurosurgery consulted and started decadron. Plan for surgery. CT scan of head/chest/abdomen/pelvis concerning for multiple areas of metastatic disease. S/p suboccipital craniectomy -Neurosurgery recommendations: dexamethasone, therapy  Essential hypertension Well controlled -Continue irbesartan  Lethargy Unknown if secondary to narcotic medication vs above problem. Neurosurgery managing. Mentation is improved today.  Sinus bradycardia Does not seem symptomatic but is lethargic at times. Bradycardia in the 30-40 bpm range. Cardiology consulted for what appeared to be episodes of tachycardia, however, assessment is that tachycardia is actually artifact.   Hypokalemia Given supplementation with improvement. Magnesium normal.  Anemia Appears to be acute. Unsure if any source of bleeding at this time. Stable.  Hyperlipidemia -Continue Crestor  Hypothyroidism TSH of 75.67. On Synthroid 50 mcg as an outpatient. Started on Synthroid 88 mcg inpatient on admission. Patient states she was not taking her Synthroid regularly -Continue Synthroid 62.5 mcg daily  Depression -Continue Zoloft and Wellbutrin  Cognitive impairment -Continue Aricept and Namenda  Compression fractures T12 and L3 noted  incidentally. TLSO brace ordered  History of papillary thyroid carcinoma/endometrial carcinoma/renal cell carcinoma  Follows at [facility] healthcare  Hyperglycemia Hemoglobin A1C of 5.6.   DVT prophylaxis: SCDs Code Status:   Code Status: Full Code Family Communication: None Disposition Plan: Discharge per primary    Procedures:   Craniectomy  Antimicrobials:  None    Subjective: No concerns today.  Objective: Vitals:   02/07/19 0400 02/07/19 0600 02/07/19 0700 02/07/19 0837  BP: 138/71  (!) 144/70 (!) 149/75  Pulse:  (!) 50 (!) 51 (!) 56  Resp: 10 13 14 17   Temp: 98.7 F (37.1 C)   (!) 97.5 F (36.4 C)  TempSrc: Axillary   Oral  SpO2:  98% 98% 98%  Weight:      Height:        Intake/Output Summary (Last 24 hours) at 02/07/2019 0950 Last data filed at 02/07/2019 0600 Gross per 24 hour  Intake 830.57 ml  Output 1175 ml  Net -344.43 ml   Filed Weights   02/04/19 1152  Weight: 65 kg    Examination:  General exam: Appears calm and comfortable Respiratory system: Clear to auscultation. Respiratory effort normal. Cardiovascular system: S1 & S2 heard, RRR. Gastrointestinal system: Abdomen is nondistended, soft and nontender. No organomegaly or masses felt. Normal bowel sounds heard. Central nervous system: Alert and oriented. No focal neurological deficits. Extremities: No edema. No calf tenderness Skin: No cyanosis. No rashes Psychiatry: Judgement and insight appear normal. Mood & affect appropriate.   Data Reviewed: I have personally reviewed following labs and imaging studies  CBC: Recent Labs  Lab 02/03/19 1827 02/04/19 1244 02/04/19 1251 02/04/19 1307 02/05/19 0653 02/06/19 0800  WBC 13.0*  --   --  10.5 15.3* 13.6*  NEUTROABS 11.9*  --   --   --   --   --   HGB 11.0* 7.8* 8.5* 9.2*  9.2* 9.2*  HCT 34.4* 23.0* 25.0* 28.6* 28.7* 28.4*  MCV 96.1  --   --  95.3 95.3 95.9  PLT 332  --   --  309 296 [id number]   Basic Metabolic Panel: Recent Labs    Lab 02/03/19 1827 02/04/19 0342 02/04/19 1244 02/04/19 1251 02/05/19 0653 02/06/19 0424  NA 135 135 139 138 138 138  K 2.7* 3.4* 3.0* 3.3* 3.5 3.4*  CL 98 100  --   --  108 107  CO2 26 25  --   --  21* 20*  GLUCOSE 154* 130*  --   --  115* 135*  BUN 18 16  --   --  16 16  CREATININE 1.04* 0.92  --   --  0.87 0.81  CALCIUM 9.5 9.6  --   --  9.5 9.3  MG  --  1.8  --   --   --   --    GFR: Estimated Creatinine Clearance: 64.8 mL/min (by C-G formula based on SCr of 0.81 mg/dL). Liver Function Tests: Recent Labs  Lab 02/03/19 1827  AST 16  ALT 12  ALKPHOS 103  BILITOT 0.5  PROT 6.3*  ALBUMIN 3.2*   No results for input(s): LIPASE, AMYLASE in the last 168 hours. No results for input(s): AMMONIA in the last 168 hours. Coagulation Profile: Recent Labs  Lab 02/03/19 2151  INR 1.0   Cardiac Enzymes: Recent Labs  Lab 02/03/19 1827  CKTOTAL 46   BNP (last 3 results) No results for input(s): PROBNP in the last 8760 hours. HbA1C: Recent Labs    02/05/19 0653  HGBA1C 5.6   CBG: Recent Labs  Lab 02/03/19 1803  GLUCAP 165*   Lipid Profile: No results for input(s): CHOL, HDL, LDLCALC, TRIG, CHOLHDL, LDLDIRECT in the last 72 hours. Thyroid Function Tests: No results for input(s): TSH, T4TOTAL, FREET4, T3FREE, THYROIDAB in the last 72 hours. Anemia Panel: No results for input(s): VITAMINB12, FOLATE, FERRITIN, TIBC, IRON, RETICCTPCT in the last 72 hours. Sepsis Labs: No results for input(s): PROCALCITON, LATICACIDVEN in the last 168 hours.  Recent Results (from the past 240 hour(s))  Respiratory Panel by RT PCR (Flu A&B, Covid) - Nasopharyngeal Swab     Status: None   Collection Time: 02/03/19  9:12 PM   Specimen: Nasopharyngeal Swab  Result Value Ref Range Status   SARS Coronavirus 2 by RT PCR NEGATIVE NEGATIVE Final    Comment: (NOTE) SARS-CoV-2 target nucleic acids are NOT DETECTED. The SARS-CoV-2 RNA is generally detectable in upper respiratoy specimens  during the acute phase of infection. The lowest concentration of SARS-CoV-2 viral copies this assay can detect is 131 copies/mL. A negative result does not preclude SARS-Cov-2 infection and should not be used as the sole basis for treatment or other patient management decisions. A negative result may occur with  improper specimen collection/handling, submission of specimen other than nasopharyngeal swab, presence of viral mutation(s) within the areas targeted by this assay, and inadequate number of viral copies (<131 copies/mL). A negative result must be combined with clinical observations, patient history, and epidemiological information. The expected result is Negative. Fact Sheet for Patients:  PinkCheek.be Fact Sheet for Healthcare Providers:  GravelBags.it This test is not yet ap proved or cleared by the [place] FDA and  has been authorized for detection and/or diagnosis of SARS-CoV-2 by FDA under an Emergency Use Authorization (EUA). This EUA will remain  in effect (meaning this test can be used) for the duration of  the COVID-19 declaration under Section 564(b)(1) of the Act, 21 U.S.C. section 360bbb-3(b)(1), unless the authorization is terminated or revoked sooner.    Influenza A by PCR NEGATIVE NEGATIVE Final   Influenza B by PCR NEGATIVE NEGATIVE Final    Comment: (NOTE) The Xpert Xpress SARS-CoV-2/FLU/RSV assay is intended as an aid in  the diagnosis of influenza from Nasopharyngeal swab specimens and  should not be used as a sole basis for treatment. Nasal washings and  aspirates are unacceptable for Xpert Xpress SARS-CoV-2/FLU/RSV  testing. Fact Sheet for Patients: PinkCheek.be Fact Sheet for Healthcare Providers: GravelBags.it This test is not yet approved or cleared by the [place] FDA and  has been authorized for detection and/or diagnosis of  SARS-CoV-2 by  FDA under an Emergency Use Authorization (EUA). This EUA will remain  in effect (meaning this test can be used) for the duration of the  Covid-19 declaration under Section 564(b)(1) of the Act, 21  U.S.C. section 360bbb-3(b)(1), unless the authorization is  terminated or revoked. Performed at [facility], [place] [address], [place], [place] [address]   Culture, Urine     Status: Abnormal   Collection Time: 02/03/19 11:37 PM   Specimen: Urine, Random  Result Value Ref Range Status   Specimen Description URINE, RANDOM  Final   Special Requests   Final    NONE Performed at [facility], [place] [address], [place], [place] [address]    Culture >=100,000 COLONIES/mL ESCHERICHIA COLI (A)  Final   Report Status 02/06/2019 FINAL  Final   Organism ID, Bacteria ESCHERICHIA COLI (A)  Final      Susceptibility   Escherichia coli - MIC*    AMPICILLIN 8 SENSITIVE Sensitive     CEFAZOLIN <=4 SENSITIVE Sensitive     CEFTRIAXONE <=1 SENSITIVE Sensitive     CIPROFLOXACIN <=0.25 SENSITIVE Sensitive     GENTAMICIN <=1 SENSITIVE Sensitive     IMIPENEM <=0.25 SENSITIVE Sensitive     NITROFURANTOIN 64 INTERMEDIATE Intermediate     TRIMETH/SULFA <=20 SENSITIVE Sensitive     AMPICILLIN/SULBACTAM <=2 SENSITIVE Sensitive     PIP/TAZO <=4 SENSITIVE Sensitive     * >=100,000 COLONIES/mL ESCHERICHIA COLI  MRSA PCR Screening     Status: None   Collection Time: 02/04/19  8:06 PM   Specimen: Nasopharyngeal  Result Value Ref Range Status   MRSA by PCR NEGATIVE NEGATIVE Final    Comment:        The GeneXpert MRSA Assay (FDA approved for NASAL specimens only), is one component of a comprehensive MRSA colonization surveillance program. It is not intended to diagnose MRSA infection nor to guide or monitor treatment for MRSA infections. Performed at [facility], [place] [address] [place],  [address]          Radiology Studies: No results  found.      Scheduled Meds: . buPROPion  200 mg Oral Daily  . busPIRone  15 mg Oral BID  . Chlorhexidine Gluconate Cloth  6 each Topical Q0600  . dexamethasone (DECADRON) injection  6 mg Intravenous Q6H  . docusate sodium  100 mg Oral BID  . irbesartan  300 mg Oral Daily  . levothyroxine  62.5 mcg Oral Q0600  . pantoprazole  40 mg Oral Q2200  . pramipexole  0.5 mg Oral QHS  . rosuvastatin  10 mg Oral QHS  . senna  1 tablet Oral BID  . sertraline  100 mg Oral Daily   Continuous Infusions: .  sodium chloride Stopped (02/04/19 2010)  . sodium chloride 200 mL/hr at 02/04/19 1557  . sodium chloride 75 mL/hr at 02/06/19 1900  . levETIRAcetam 500 mg (02/06/19 2204)     LOS: 4 days     Cordelia Poche, MD [facility] 02/07/2019, 9:50 AM  If 7PM-7AM, please contact night-coverage [email or web address]

## 2019-02-08 LAB — BPAM RBC
Blood Product Expiration Date: 202101232359
Blood Product Expiration Date: 202101232359
ISSUE DATE / TIME: 202012220658
ISSUE DATE / TIME: 202012220708
Unit Type and Rh: 6200
Unit Type and Rh: 6200

## 2019-02-08 LAB — TYPE AND SCREEN
ABO/RH(D): A POS
Antibody Screen: NEGATIVE
Unit division: 0
Unit division: 0

## 2019-02-08 NOTE — Progress Notes (Signed)
PROGRESS NOTE    Krista Hodges  [id number] DOB: 15-Feb-1955 DOA: 02/03/2019 PCP: Josetta Huddle, MD   Brief Narrative: Krista Hodges is a 63 y.o. 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 Patient presented secondary to worsening confusion and found to have a cerebellar mass with vasogenic edema with concern for metastatic disease.   Assessment & Plan:   Active Problems:   Essential (primary) hypertension   Brain tumor ([place])   Hypokalemia   Hypothyroidism   Cerebellar mass with vasogenic edema Neurosurgery consulted and started decadron. Plan for surgery. CT scan of head/chest/abdomen/pelvis concerning for multiple areas of metastatic disease. S/p suboccipital craniectomy -Neurosurgery recommendations: dexamethasone, therapy  Essential hypertension Well controlled -Continue irbesartan  Lethargy Unknown if secondary to narcotic medication vs above problem. Neurosurgery managing. Resolved.  Sinus bradycardia Does not seem symptomatic but is lethargic at times. Bradycardia in the 30-40 bpm range at lowest. Cardiology consulted for what appeared to be episodes of tachycardia, however, assessment is that tachycardia is actually artifact. Currently stable.  Hypokalemia Given supplementation with improvement. Magnesium normal.  Anemia Appears to be acute. Unsure if any source of bleeding at this time. Stable.  Hyperlipidemia -Continue Crestor  Hypothyroidism TSH of 75.67. On Synthroid 50 mcg as an outpatient. Started on Synthroid 88 mcg inpatient on admission. Patient states she was not taking her Synthroid regularly -Continue Synthroid 62.5 mcg daily; recommend to continue Synthroid at increased dose of 62.5 mcg daily on discharge.  Depression -Continue Zoloft and Wellbutrin  Cognitive  impairment -Continue Aricept and Namenda  Compression fractures T12 and L3 noted incidentally. TLSO brace ordered  History of papillary thyroid carcinoma/endometrial carcinoma/renal cell carcinoma  Follows at [facility] healthcare  Hyperglycemia Hemoglobin A1C of 5.6.   DVT prophylaxis: SCDs Code Status:   Code Status: Full Code Family Communication: None Disposition Plan: Discharge per primary    Procedures:   Craniectomy  Antimicrobials:  None    Subjective: No issues noted. Hoping to go home.  Objective: Vitals:   02/07/19 1627 02/07/19 2008 02/07/19 2334 02/08/19 0737  BP: (!) 141/72 133/74 128/65 (!) 162/68  Pulse: (!) 57 (!) 59 (!) 59 (!) 54  Resp: 18 15 16 14   Temp: 97.6 F (36.4 C) 97.6 F (36.4 C) (!) 97.5 F (36.4 C) (!) 97.4 F (36.3 C)  TempSrc: Oral Oral Oral Oral  SpO2: 100% 91% 96% 100%  Weight:      Height:       No intake or output data in the 24 hours ending 02/08/19 1011 Filed Weights   02/04/19 1152  Weight: 65 kg    Examination:  General exam: Appears calm and comfortable Respiratory system: Clear to auscultation. Respiratory effort normal. Cardiovascular system: S1 & S2 heard, RRR. No murmurs, rubs, gallops or clicks. Gastrointestinal system: Abdomen is nondistended, soft and nontender. No organomegaly or masses felt. Normal bowel sounds heard. Central nervous system: Alert and oriented. No focal neurological deficits. Extremities: No edema. No calf tenderness Skin: No cyanosis. No rashes Psychiatry: Judgement and insight appear normal. Mood & affect appropriate.   Data Reviewed: I have personally reviewed following labs and imaging studies  CBC: Recent Labs  Lab 02/03/19 1827 02/04/19 1244 02/04/19 1251 02/04/19 1307 02/05/19 0653 02/06/19 0800  WBC 13.0*  --   --  10.5 15.3* 13.6*  NEUTROABS 11.9*  --   --   --   --   --  HGB 11.0* 7.8* 8.5* 9.2* 9.2* 9.2*  HCT 34.4* 23.0* 25.0* 28.6* 28.7* 28.4*  MCV 96.1  --   --   95.3 95.3 95.9  PLT 332  --   --  309 296 [id number]   Basic Metabolic Panel: Recent Labs  Lab 02/03/19 1827 02/04/19 0342 02/04/19 1244 02/04/19 1251 02/05/19 0653 02/06/19 0424  NA 135 135 139 138 138 138  K 2.7* 3.4* 3.0* 3.3* 3.5 3.4*  CL 98 100  --   --  108 107  CO2 26 25  --   --  21* 20*  GLUCOSE 154* 130*  --   --  115* 135*  BUN 18 16  --   --  16 16  CREATININE 1.04* 0.92  --   --  0.87 0.81  CALCIUM 9.5 9.6  --   --  9.5 9.3  MG  --  1.8  --   --   --   --    GFR: Estimated Creatinine Clearance: 64.8 mL/min (by C-G formula based on SCr of 0.81 mg/dL). Liver Function Tests: Recent Labs  Lab 02/03/19 1827  AST 16  ALT 12  ALKPHOS 103  BILITOT 0.5  PROT 6.3*  ALBUMIN 3.2*   No results for input(s): LIPASE, AMYLASE in the last 168 hours. No results for input(s): AMMONIA in the last 168 hours. Coagulation Profile: Recent Labs  Lab 02/03/19 2151  INR 1.0   Cardiac Enzymes: Recent Labs  Lab 02/03/19 1827  CKTOTAL 46   BNP (last 3 results) No results for input(s): PROBNP in the last 8760 hours. HbA1C: No results for input(s): HGBA1C in the last 72 hours. CBG: Recent Labs  Lab 02/03/19 1803  GLUCAP 165*   Lipid Profile: No results for input(s): CHOL, HDL, LDLCALC, TRIG, CHOLHDL, LDLDIRECT in the last 72 hours. Thyroid Function Tests: No results for input(s): TSH, T4TOTAL, FREET4, T3FREE, THYROIDAB in the last 72 hours. Anemia Panel: No results for input(s): VITAMINB12, FOLATE, FERRITIN, TIBC, IRON, RETICCTPCT in the last 72 hours. Sepsis Labs: No results for input(s): PROCALCITON, LATICACIDVEN in the last 168 hours.  Recent Results (from the past 240 hour(s))  Respiratory Panel by RT PCR (Flu A&B, Covid) - Nasopharyngeal Swab     Status: None   Collection Time: 02/03/19  9:12 PM   Specimen: Nasopharyngeal Swab  Result Value Ref Range Status   SARS Coronavirus 2 by RT PCR NEGATIVE NEGATIVE Final    Comment: (NOTE) SARS-CoV-2 target nucleic acids  are NOT DETECTED. The SARS-CoV-2 RNA is generally detectable in upper respiratoy specimens during the acute phase of infection. The lowest concentration of SARS-CoV-2 viral copies this assay can detect is 131 copies/mL. A negative result does not preclude SARS-Cov-2 infection and should not be used as the sole basis for treatment or other patient management decisions. A negative result may occur with  improper specimen collection/handling, submission of specimen other than nasopharyngeal swab, presence of viral mutation(s) within the areas targeted by this assay, and inadequate number of viral copies (<131 copies/mL). A negative result must be combined with clinical observations, patient history, and epidemiological information. The expected result is Negative. Fact Sheet for Patients:  PinkCheek.be Fact Sheet for Healthcare Providers:  GravelBags.it This test is not yet ap proved or cleared by the [place] FDA and  has been authorized for detection and/or diagnosis of SARS-CoV-2 by FDA under an Emergency Use Authorization (EUA). This EUA will remain  in effect (meaning this test can be used) for the  duration of the COVID-19 declaration under Section 564(b)(1) of the Act, 21 U.S.C. section 360bbb-3(b)(1), unless the authorization is terminated or revoked sooner.    Influenza A by PCR NEGATIVE NEGATIVE Final   Influenza B by PCR NEGATIVE NEGATIVE Final    Comment: (NOTE) The Xpert Xpress SARS-CoV-2/FLU/RSV assay is intended as an aid in  the diagnosis of influenza from Nasopharyngeal swab specimens and  should not be used as a sole basis for treatment. Nasal washings and  aspirates are unacceptable for Xpert Xpress SARS-CoV-2/FLU/RSV  testing. Fact Sheet for Patients: PinkCheek.be Fact Sheet for Healthcare Providers: GravelBags.it This test is not yet approved or  cleared by the [place] FDA and  has been authorized for detection and/or diagnosis of SARS-CoV-2 by  FDA under an Emergency Use Authorization (EUA). This EUA will remain  in effect (meaning this test can be used) for the duration of the  Covid-19 declaration under Section 564(b)(1) of the Act, 21  U.S.C. section 360bbb-3(b)(1), unless the authorization is  terminated or revoked. Performed at [facility], [place] [address], [place], [place] [address]   Culture, Urine     Status: Abnormal   Collection Time: 02/03/19 11:37 PM   Specimen: Urine, Random  Result Value Ref Range Status   Specimen Description URINE, RANDOM  Final   Special Requests   Final    NONE Performed at [facility], [place] [address], [place], [place] [address]    Culture >=100,000 COLONIES/mL ESCHERICHIA COLI (A)  Final   Report Status 02/06/2019 FINAL  Final   Organism ID, Bacteria ESCHERICHIA COLI (A)  Final      Susceptibility   Escherichia coli - MIC*    AMPICILLIN 8 SENSITIVE Sensitive     CEFAZOLIN <=4 SENSITIVE Sensitive     CEFTRIAXONE <=1 SENSITIVE Sensitive     CIPROFLOXACIN <=0.25 SENSITIVE Sensitive     GENTAMICIN <=1 SENSITIVE Sensitive     IMIPENEM <=0.25 SENSITIVE Sensitive     NITROFURANTOIN 64 INTERMEDIATE Intermediate     TRIMETH/SULFA <=20 SENSITIVE Sensitive     AMPICILLIN/SULBACTAM <=2 SENSITIVE Sensitive     PIP/TAZO <=4 SENSITIVE Sensitive     * >=100,000 COLONIES/mL ESCHERICHIA COLI  MRSA PCR Screening     Status: None   Collection Time: 02/04/19  8:06 PM   Specimen: Nasopharyngeal  Result Value Ref Range Status   MRSA by PCR NEGATIVE NEGATIVE Final    Comment:        The GeneXpert MRSA Assay (FDA approved for NASAL specimens only), is one component of a comprehensive MRSA colonization surveillance program. It is not intended to diagnose MRSA infection nor to guide or monitor treatment for MRSA infections. Performed at [facility], [place] [address]  [place],  [address]          Radiology Studies: No results found.      Scheduled Meds: . buPROPion  200 mg Oral Daily  . busPIRone  15 mg Oral BID  . Chlorhexidine Gluconate Cloth  6 each Topical Q0600  . dexamethasone (DECADRON) injection  6 mg Intravenous Q6H  . docusate sodium  100 mg Oral BID  . irbesartan  300 mg Oral Daily  . levothyroxine  62.5 mcg Oral Q0600  . pantoprazole  40 mg Oral Q2200  . pramipexole  0.5 mg Oral QHS  . rosuvastatin  10 mg Oral QHS  . senna  1 tablet Oral BID  . sertraline  100 mg Oral Daily  Continuous Infusions: . sodium chloride Stopped (02/04/19 2010)  . sodium chloride 200 mL/hr at 02/04/19 1557  . sodium chloride 75 mL/hr at 02/07/19 1306  . levETIRAcetam 500 mg (02/08/19 0927)     LOS: 5 days     Cordelia Poche, MD [facility] 02/08/2019, 10:11 AM  If 7PM-7AM, please contact night-coverage [email or web address]

## 2019-02-08 NOTE — Progress Notes (Signed)
Subjective: Patient reports feeling stronger  Objective: Vital signs in last 24 hours: Temp:  [97.4 F (36.3 C)-97.6 F (36.4 C)] 97.4 F (36.3 C) (12/27 0737) Pulse Rate:  [53-59] 54 (12/27 0737) Resp:  [14-18] 14 (12/27 0737) BP: (128-162)/(65-74) 162/68 (12/27 0737) SpO2:  [91 %-100 %] 100 % (12/27 0737)  Intake/Output from previous day: No intake/output data recorded. Intake/Output this shift: No intake/output data recorded.  Physical Exam: Awake, alert, conversant.  MAEW to command  Lab Results: Recent Labs    02/06/19 0800  WBC 13.6*  HGB 9.2*  HCT 28.4*  PLT 259   BMET Recent Labs    02/06/19 0424  NA 138  K 3.4*  CL 107  CO2 20*  GLUCOSE 135*  BUN 16  CREATININE 0.81  CALCIUM 9.3    Studies/Results: No results found.  Assessment/Plan: Mobilize with therapies.  Continue supportive care.      LOS: 5 days    Peggyann Shoals, MD 02/08/2019, 8:58 AM

## 2019-02-09 LAB — GLUCOSE, CAPILLARY
Glucose-Capillary: 114 mg/dL — ABNORMAL HIGH (ref 70–99)
Glucose-Capillary: 99 mg/dL (ref 70–99)

## 2019-02-09 NOTE — Progress Notes (Signed)
PROGRESS NOTE    Krista Hodges  [id number] DOB: 05/02/1955 DOA: 02/03/2019 PCP: Josetta Huddle, MD   Brief Narrative: Krista Hodges is a 63 y.o. 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 Patient presented secondary to worsening confusion and found to have a cerebellar mass with vasogenic edema with concern for metastatic disease.   Assessment & Plan:   Active Problems:   Essential (primary) hypertension   Brain tumor ([place])   Hypokalemia   Hypothyroidism   Cerebellar mass with vasogenic edema Neurosurgery consulted and started decadron. Plan for surgery. CT scan of head/chest/abdomen/pelvis concerning for multiple areas of metastatic disease. S/p suboccipital craniectomy -Neurosurgery recommendations: dexamethasone, therapy  Essential hypertension Well controlled -Continue irbesartan  Lethargy Unknown if secondary to narcotic medication vs above problem. Neurosurgery managing. Resolved.  Sinus bradycardia Does not seem symptomatic but is lethargic at times. Bradycardia in the 30-40 bpm range at lowest. Cardiology consulted for what appeared to be episodes of tachycardia, however, assessment is that tachycardia is actually artifact. Currently stable with episodes of sinus bradycardia.  Hypokalemia Given supplementation with improvement. Magnesium normal.  Anemia Appears to be acute. Unsure if any source of bleeding at this time. Stable.  Hyperlipidemia -Continue Crestor  Hypothyroidism TSH of 75.67. On Synthroid 50 mcg as an outpatient. Started on Synthroid 88 mcg inpatient on admission. Patient states she was not taking her Synthroid regularly -Continue Synthroid 62.5 mcg daily; recommend to continue Synthroid at increased dose of 62.5 mcg daily on discharge. Will need repeat TSH testing  in 4-6 weeks by PCP; sooner if she develops symptoms of hyperthyroidism. Discussed with patient on 12/28.  Depression -Continue Zoloft and Wellbutrin  Cognitive impairment -Continue Aricept and Namenda  Compression fractures T12 and L3 noted incidentally. TLSO brace ordered  History of papillary thyroid carcinoma/endometrial carcinoma/renal cell carcinoma  Follow[facility] at [facility]  Hyperglycemia Hemoglobin A1C of 5.6.   DVT prophylaxis: SCDs Code Status:   Code Status: Full Code Family Communication: None Disposition Plan: Discharge per primary    Procedures:   Craniectomy  Antimicrobials:  None    Subjective: No issues today.  Objective: Vitals:   02/09/19 0000 02/09/19 0200 02/09/19 0247 02/09/19 0758  BP: (!) 146/77   (!) 141/67  Pulse:  (!) 44 (!) 44 (!) 44  Resp: 13 10 14 11   Temp:    98.1 F (36.7 C)  TempSrc:    Oral  SpO2: 99% 100% 95%   Weight:      Height:        Intake/Output Summary (Last 24 hours) at 02/09/2019 0946 Last data filed at 02/08/2019 1800 Gross per 24 hour  Intake 3946.17 ml  Output -  Net 3946.17 ml   Filed Weights   02/04/19 1152  Weight: 65 kg    Examination:  General exam: Appears calm and comfortable Respiratory system: Clear to auscultation. Respiratory effort normal. Cardiovascular system: S1 & S2 heard, RRR. No murmurs, rubs, gallops or clicks. Gastrointestinal system: Abdomen is nondistended, soft and nontender. No organomegaly or masses felt. Normal bowel sounds heard. Central nervous system: Alert and oriented. No focal neurological deficits. Extremities: No edema. No calf tenderness Skin: No cyanosis. No rashes Psychiatry: Judgement and insight appear normal. Mood & affect appropriate.  Data Reviewed: I have personally reviewed following labs and imaging studies  CBC: Recent Labs  Lab 02/03/19 1827 02/04/19 1244 02/04/19 1251 02/04/19 1307  02/05/19 0653 02/06/19 0800  WBC 13.0*  --   --  10.5  15.3* 13.6*  NEUTROABS 11.9*  --   --   --   --   --   HGB 11.0* 7.8* 8.5* 9.2* 9.2* 9.2*  HCT 34.4* 23.0* 25.0* 28.6* 28.7* 28.4*  MCV 96.1  --   --  95.3 95.3 95.9  PLT 332  --   --  309 296 [id number]   Basic Metabolic Panel: Recent Labs  Lab 02/03/19 1827 02/04/19 0342 02/04/19 1244 02/04/19 1251 02/05/19 0653 02/06/19 0424  NA 135 135 139 138 138 138  K 2.7* 3.4* 3.0* 3.3* 3.5 3.4*  CL 98 100  --   --  108 107  CO2 26 25  --   --  21* 20*  GLUCOSE 154* 130*  --   --  115* 135*  BUN 18 16  --   --  16 16  CREATININE 1.04* 0.92  --   --  0.87 0.81  CALCIUM 9.5 9.6  --   --  9.5 9.3  MG  --  1.8  --   --   --   --    GFR: Estimated Creatinine Clearance: 64.8 mL/min (by C-G formula based on SCr of 0.81 mg/dL). Liver Function Tests: Recent Labs  Lab 02/03/19 1827  AST 16  ALT 12  ALKPHOS 103  BILITOT 0.5  PROT 6.3*  ALBUMIN 3.2*   No results for input(s): LIPASE, AMYLASE in the last 168 hours. No results for input(s): AMMONIA in the last 168 hours. Coagulation Profile: Recent Labs  Lab 02/03/19 2151  INR 1.0   Cardiac Enzymes: Recent Labs  Lab 02/03/19 1827  CKTOTAL 46   BNP (last 3 results) No results for input(s): PROBNP in the last 8760 hours. HbA1C: No results for input(s): HGBA1C in the last 72 hours. CBG: Recent Labs  Lab 02/03/19 1803 02/09/19 0735  GLUCAP 165* 114*   Lipid Profile: No results for input(s): CHOL, HDL, LDLCALC, TRIG, CHOLHDL, LDLDIRECT in the last 72 hours. Thyroid Function Tests: No results for input(s): TSH, T4TOTAL, FREET4, T3FREE, THYROIDAB in the last 72 hours. Anemia Panel: No results for input(s): VITAMINB12, FOLATE, FERRITIN, TIBC, IRON, RETICCTPCT in the last 72 hours. Sepsis Labs: No results for input(s): PROCALCITON, LATICACIDVEN in the last 168 hours.  Recent Results (from the past 240 hour(s))  Respiratory Panel by RT PCR (Flu A&B, Covid) - Nasopharyngeal Swab     Status: None   Collection Time: 02/03/19  9:12 PM    Specimen: Nasopharyngeal Swab  Result Value Ref Range Status   SARS Coronavirus 2 by RT PCR NEGATIVE NEGATIVE Final    Comment: (NOTE) SARS-CoV-2 target nucleic acids are NOT DETECTED. The SARS-CoV-2 RNA is generally detectable in upper respiratoy specimens during the acute phase of infection. The lowest concentration of SARS-CoV-2 viral copies this assay can detect is 131 copies/mL. A negative result does not preclude SARS-Cov-2 infection and should not be used as the sole basis for treatment or other patient management decisions. A negative result may occur with  improper specimen collection/handling, submission of specimen other than nasopharyngeal swab, presence of viral mutation(s) within the areas targeted by this assay, and inadequate number of viral copies (<131 copies/mL). A negative result must be combined with clinical observations, patient history, and epidemiological information. The expected result is Negative. Fact Sheet for Patients:  PinkCheek.be Fact Sheet for Healthcare Providers:  GravelBags.it This test is not yet ap proved or cleared by  the [employer] and  has been authorized for detection and/or diagnosis of SARS-CoV-2 by FDA under an Emergency Use Authorization (EUA). This EUA will remain  in effect (meaning this test can be used) for the duration of the COVID-19 declaration under Section 564(b)(1) of the Act, 21 U.S.C. section 360bbb-3(b)(1), unless the authorization is terminated or revoked sooner.    Influenza A by PCR NEGATIVE NEGATIVE Final   Influenza B by PCR NEGATIVE NEGATIVE Final    Comment: (NOTE) The Xpert Xpress SARS-CoV-2/FLU/RSV assay is intended as an aid in  the diagnosis of influenza from Nasopharyngeal swab specimens and  should not be used as a sole basis for treatment. Nasal washings and  aspirates are unacceptable for Xpert Xpress SARS-CoV-2/FLU/RSV  testing. Fact Sheet  for Patients: PinkCheek.be Fact Sheet for Healthcare Providers: GravelBags.it This test is not yet approved or cleared by the [place] FDA and  has been authorized for detection and/or diagnosis of SARS-CoV-2 by  FDA under an Emergency Use Authorization (EUA). This EUA will remain  in effect (meaning this test can be used) for the duration of the  Covid-19 declaration under Section 564(b)(1) of the Act, 21  U.S.C. section 360bbb-3(b)(1), unless the authorization is  terminated or revoked. Performed at [facility], [place] [address], [place], [place] [address]   Culture, Urine     Status: Abnormal   Collection Time: 02/03/19 11:37 PM   Specimen: Urine, Random  Result Value Ref Range Status   Specimen Description URINE, RANDOM  Final   Special Requests   Final    NONE Performed at [facility], [place] [address], [place], [place] [address]    Culture >=100,000 COLONIES/mL ESCHERICHIA COLI (A)  Final   Report Status 02/06/2019 FINAL  Final   Organism ID, Bacteria ESCHERICHIA COLI (A)  Final      Susceptibility   Escherichia coli - MIC*    AMPICILLIN 8 SENSITIVE Sensitive     CEFAZOLIN <=4 SENSITIVE Sensitive     CEFTRIAXONE <=1 SENSITIVE Sensitive     CIPROFLOXACIN <=0.25 SENSITIVE Sensitive     GENTAMICIN <=1 SENSITIVE Sensitive     IMIPENEM <=0.25 SENSITIVE Sensitive     NITROFURANTOIN 64 INTERMEDIATE Intermediate     TRIMETH/SULFA <=20 SENSITIVE Sensitive     AMPICILLIN/SULBACTAM <=2 SENSITIVE Sensitive     PIP/TAZO <=4 SENSITIVE Sensitive     * >=100,000 COLONIES/mL ESCHERICHIA COLI  MRSA PCR Screening     Status: None   Collection Time: 02/04/19  8:06 PM   Specimen: Nasopharyngeal  Result Value Ref Range Status   MRSA by PCR NEGATIVE NEGATIVE Final    Comment:        The GeneXpert MRSA Assay (FDA approved for NASAL specimens only), is one component of a comprehensive MRSA colonization surveillance  program. It is not intended to diagnose MRSA infection nor to guide or monitor treatment for MRSA infections. Performed at [facility], [place] [address], [place], [place] [address]          Radiology Studies: No results found.      Scheduled Meds: . buPROPion  200 mg Oral Daily  . busPIRone  15 mg Oral BID  . dexamethasone (DECADRON) injection  6 mg Intravenous Q6H  . docusate sodium  100 mg Oral BID  . irbesartan  300 mg Oral Daily  . levothyroxine  62.5 mcg Oral Q0600  . pantoprazole  40 mg Oral Q2200  . pramipexole  0.5 mg  Oral QHS  . rosuvastatin  10 mg Oral QHS  . senna  1 tablet Oral BID  . sertraline  100 mg Oral Daily   Continuous Infusions: . sodium chloride Stopped (02/04/19 2010)  . sodium chloride 200 mL/hr at 02/04/19 1557  . sodium chloride 75 mL/hr at 02/08/19 1800  . levETIRAcetam 500 mg (02/08/19 2135)     LOS: 6 days     Cordelia Poche, MD [facility] 02/09/2019, 9:46 AM  If 7PM-7AM, please contact night-coverage [email or web address]

## 2019-02-09 NOTE — Plan of Care (Signed)
  Problem: Elimination: Goal: Will not experience complications related to bowel motility Outcome: Progressing Note: Pt last BM prior to admission and feels constipated, administered PRN suppository since Miralax did not have much effect this morning. Re-assessed and pt had a BM this afternoon. Will continue to assess bowel pattern as needed.   Problem: Pain Managment: Goal: General experience of comfort will improve Note: Following ambulation with PT pt requesting pain medication. Chief complaint of pain is 9/10 to back area. PRN Dilaudid IV 1mg  administered. Went to re-assess and pt is asleep. Will continue to monitor.

## 2019-02-09 NOTE — Progress Notes (Signed)
Physical Therapy Treatment Patient Details Name: Krista Hodges MRN: [id number] DOB: 10/13/1955 Today's Date: 02/09/2019    History of Present Illness 63 yo admitted with falls and weakness found to have T12 and L3 fx to be treated with TLSO for OOB and cerebellar mass s/p craniotomy with MRI also demonstrating multiple breast, lung, adrenal and possible liver mass. PMHx: HTN, depression, HLD and multiple cancers including ?RCC s/p nephrectomy around 2015, endometrial adenocarcinoma 2017, papillary thyroid carcinoma 2019 s/p radiation. Pt also recently diagnosed with dementia.    PT Comments    Making steady improvements.  Still having difficulty donning the brace.  Emphasis on education, transitions, sit to stand and progressing gait.    Follow Up Recommendations  Home health PT;Supervision/Assistance - 24 hour;SNF     Equipment Recommendations  3in1 (PT)    Recommendations for Other Services       Precautions / Restrictions Precautions Precautions: Fall;Back Required Braces or Orthoses: Spinal Brace Spinal Brace: Thoracolumbosacral orthotic;Applied in sitting position    Mobility  Bed Mobility Overal bed mobility: Needs Assistance Bed Mobility: Rolling;Sidelying to Sit Rolling: Mod assist Sidelying to sit: Mod assist       General bed mobility comments: cues and assist to roll several times for peri-care.  Transfers Overall transfer level: Needs assistance Equipment used: Rolling walker (2 wheeled) Transfers: Sit to/from Stand   Stand pivot transfers: Min assist       General transfer comment: stability assist.  Assist to come forward more than boost.  Ambulation/Gait Ambulation/Gait assistance: Min assist Gait Distance (Feet): 90 Feet Assistive device: Rolling walker (2 wheeled) Gait Pattern/deviations: Step-through pattern;Decreased stride length Gait velocity: slow   General Gait Details: Still short step lengths, staying too far behind the RW,  generally coordinated steps.   Stairs             Wheelchair Mobility    Modified Rankin (Stroke Patients Only)       Balance Overall balance assessment: Needs assistance Sitting-balance support: No upper extremity supported Sitting balance-Leahy Scale: Fair Sitting balance - Comments: min guard EOB and while brace donned     Standing balance-Leahy Scale: Poor Standing balance comment: reliance on RW                            Cognition Arousal/Alertness: Awake/alert Behavior During Therapy: Flat affect;WFL for tasks assessed/performed Overall Cognitive Status: Impaired/Different from baseline                   Orientation Level: Situation;Time Current Attention Level: Sustained Memory: Decreased recall of precautions Following Commands: Follows one step commands with increased time Safety/Judgement: Decreased awareness of deficits Awareness: Emergent Problem Solving: Slow processing;Decreased initiation;Difficulty sequencing        Exercises      General Comments General comments (skin integrity, edema, etc.): reinforced back precautions, bracing issues, lifting restrictions and progression of activity.      Pertinent Vitals/Pain Pain Assessment: Faces Faces Pain Scale: Hurts little more Pain Location: back Pain Descriptors / Indicators: Aching;Discomfort Pain Intervention(s): Monitored during session    Home Living                      Prior Function            PT Goals (current goals can now be found in the care plan section) Acute Rehab PT Goals Patient Stated Goal: home PT Goal Formulation: With patient/family Time For  Goal Achievement: 02/16/19 Potential to Achieve Goals: Good Progress towards PT goals: Progressing toward goals    Frequency    Min 4X/week      PT Plan Current plan remains appropriate    Co-evaluation              AM-PAC PT "6 Clicks" Mobility   Outcome Measure  Help needed  turning from your back to your side while in a flat bed without using bedrails?: A Lot Help needed moving from lying on your back to sitting on the side of a flat bed without using bedrails?: A Lot Help needed moving to and from a bed to a chair (including a wheelchair)?: A Little Help needed standing up from a chair using your arms (e.g., wheelchair or bedside chair)?: A Little Help needed to walk in hospital room?: A Little Help needed climbing 3-5 steps with a railing? : A Lot 6 Click Score: 15    End of Session Equipment Utilized During Treatment: Back brace Activity Tolerance: Patient tolerated treatment well Patient left: in chair;with call bell/phone within reach;with chair alarm set;with family/visitor present Nurse Communication: Mobility status;Precautions PT Visit Diagnosis: Other abnormalities of gait and mobility (R26.89);Pain;Other symptoms and signs involving the nervous system (R29.898) Pain - part of body: (back)     Time: [id number] PT Time Calculation (min) (ACUTE ONLY): 40 min  Charges:  $Gait Training: 8-22 mins $Therapeutic Activity: 8-22 mins $Self Care/Home Management: 8-22                     02/09/2019  Krista Hodges., PT Acute Rehabilitation Services [phone]  (pager) [phone]  (office)   Krista Hodges Krista Hodges 02/09/2019, 6:35 PM

## 2019-02-10 MED ORDER — LEVOTHYROXINE SODIUM 75 MCG PO TABS
75.0000 ug | ORAL_TABLET | Freq: Every day | ORAL | Status: DC
  Administered 2019-02-11 – 2019-02-16 (×6): 75 ug via ORAL
  Filled 2019-02-10 (×6): qty 1

## 2019-02-10 MED ORDER — LEVOTHYROXINE SODIUM 75 MCG PO TABS: 75.0000 ug | ORAL_TABLET | Freq: Every day | ORAL | 0 refills | Status: DC

## 2019-02-10 NOTE — Progress Notes (Signed)
Subjective: Patient reports some mild headache but doing well  Objective: Vital signs in last 24 hours: Temp:  [97.9 F (36.6 C)-98.3 F (36.8 C)] 97.9 F (36.6 C) (12/29 1927) Pulse Rate:  [49-79] 57 (12/29 1958) Resp:  [13-20] 13 (12/29 1958) BP: (128-144)/(73-80) 137/75 (12/29 1958) SpO2:  [98 %-100 %] 98 % (12/29 1958)  Intake/Output from previous day: 12/28 0701 - 12/29 0700 In: 2289.6 [P.O.:360; I.V.:1729.6; IV Piggyback:200] Out: 600 [Urine:600] Intake/Output this shift: No intake/output data recorded.  Neurologic: Grossly normal  Lab Results: Lab Results  Component Value Date   WBC 13.6 (H) 02/06/2019   HGB 9.2 (L) 02/06/2019   HCT 28.4 (L) 02/06/2019   MCV 95.9 02/06/2019   PLT 259 02/06/2019   Lab Results  Component Value Date   INR 1.0 02/03/2019   BMET Lab Results  Component Value Date   NA 138 02/06/2019   K 3.4 (L) 02/06/2019   CL 107 02/06/2019   CO2 20 (L) 02/06/2019   GLUCOSE 135 (H) 02/06/2019   BUN 16 02/06/2019   CREATININE 0.81 02/06/2019   CALCIUM 9.3 02/06/2019    Studies/Results: No results found.  Assessment/Plan: Continue therapies, probably discharge tomorrow.    LOS: 7 days    Ocie Cornfield Gaylen Pereira 02/10/2019, 10:03 PM

## 2019-02-10 NOTE — Progress Notes (Signed)
Physical Therapy Treatment Patient Details Name: Krista Hodges MRN: [id number] DOB: [birth year] Today's Date: 02/10/2019    History of Present Illness 63 yo admitted with falls and weakness found to have T12 and L3 fx to be treated with TLSO for OOB and cerebellar mass s/p craniotomy with MRI also demonstrating multiple breast, lung, adrenal and possible liver mass. PMHx: HTN, depression, HLD and multiple cancers including ?RCC s/p nephrectomy around 2015, endometrial adenocarcinoma 2017, papillary thyroid carcinoma 2019 s/p radiation. Pt also recently diagnosed with dementia.    PT Comments    Pt more flat of affect and anxious today.  Very slow to move with a brady/stiff quality of movement.  Emphasis on sit to stand trials and short gait trials.   Follow Up Recommendations  Other (comment);Home health PT;SNF;Supervision/Assistance - 24 hour  pt would benefit from [facility] first program due to dementia and familiar environment. If this isn't possible then SNF     Equipment Recommendations  3in1 (PT)    Recommendations for Other Services       Precautions / Restrictions Precautions Precautions: Fall;Back Precaution Booklet Issued: Yes (comment) Required Braces or Orthoses: Spinal Brace Spinal Brace: Thoracolumbosacral orthotic;Applied in sitting position Restrictions Weight Bearing Restrictions: No    Mobility  Bed Mobility Overal bed mobility: Needs Assistance             General bed mobility comments: up in the chair on arrival  Transfers Overall transfer level: Needs assistance Equipment used: Rolling walker (2 wheeled) Transfers: Sit to/from Stand Sit to Stand: Max assist;+2 physical assistance;+2 safety/equipment         General transfer comment: repetitive/consistent cues for technique, direction and hand placement.  Significant assist to come forward and boost up.  Ambulation/Gait Ambulation/Gait assistance: Mod assist;Max assist Gait Distance  (Feet): 15 Feet(x3) Assistive device: Rolling walker (2 wheeled) Gait Pattern/deviations: Step-to pattern;Step-through pattern;Decreased stride length;Shuffle Gait velocity: slow Gait velocity interpretation: <1.31 ft/sec, indicative of household ambulator General Gait Details: very short slow guarded steps.  bradykinetic, tense and locked up quality.   Stairs             Wheelchair Mobility    Modified Rankin (Stroke Patients Only)       Balance Overall balance assessment: Needs assistance Sitting-balance support: Single extremity supported;Bilateral upper extremity supported Sitting balance-Leahy Scale: Fair(to poor) Sitting balance - Comments: min guard EOB   Standing balance support: Bilateral upper extremity supported Standing balance-Leahy Scale: Poor Standing balance comment: required AD and external support                            Cognition Arousal/Alertness: Awake/alert Behavior During Therapy: Flat affect Overall Cognitive Status: Impaired/Different from baseline                   Orientation Level: Situation Current Attention Level: Sustained   Following Commands: Follows one step commands with increased time Safety/Judgement: Decreased awareness of deficits Awareness: Emergent Problem Solving: Slow processing;Decreased initiation;Difficulty sequencing        Exercises      General Comments General comments (skin integrity, edema, etc.): reinforced back precautions, bracing issues, lifting restrictions and progression of activity.      Pertinent Vitals/Pain Pain Assessment: Faces Faces Pain Scale: Hurts little more Pain Location: back Pain Descriptors / Indicators: Aching;Discomfort Pain Intervention(s): Monitored during session    Home Living  Prior Function            PT Goals (current goals can now be found in the care plan section) Acute Rehab PT Goals Patient Stated Goal: home PT  Goal Formulation: With patient/family Time For Goal Achievement: 02/16/19 Potential to Achieve Goals: Good Progress towards PT goals: Not progressing toward goals - comment(much less mobile today)    Frequency    Min 4X/week      PT Plan Current plan remains appropriate    Co-evaluation              AM-PAC PT "6 Clicks" Mobility   Outcome Measure  Help needed turning from your back to your side while in a flat bed without using bedrails?: A Lot Help needed moving from lying on your back to sitting on the side of a flat bed without using bedrails?: A Lot Help needed moving to and from a bed to a chair (including a wheelchair)?: A Lot Help needed standing up from a chair using your arms (e.g., wheelchair or bedside chair)?: A Lot Help needed to walk in hospital room?: A Lot Help needed climbing 3-5 steps with a railing? : A Lot 6 Click Score: 12    End of Session Equipment Utilized During Treatment: Back brace Activity Tolerance: Patient tolerated treatment well;Patient limited by pain;Patient limited by fatigue Patient left: in chair;with call bell/phone within reach;with chair alarm set;with family/visitor present Nurse Communication: Mobility status;Precautions PT Visit Diagnosis: Other abnormalities of gait and mobility (R26.89);Pain;Other symptoms and signs involving the nervous system (R29.898) Pain - part of body: (back)     Time: 1129-1207 PT Time Calculation (min) (ACUTE ONLY): 38 min  Charges:  $Gait Training: 23-37 mins $Therapeutic Activity: 8-22 mins                     02/10/2019  Krista Hodges., PT Acute Rehabilitation Services [phone]  (pager) [phone]  (office)   Krista Hodges Krista Hodges 02/10/2019, 12:20 PM

## 2019-02-10 NOTE — Progress Notes (Signed)
PROGRESS NOTE    Krista Hodges  [id number] DOB: 1955-08-12 DOA: 02/03/2019 PCP: Josetta Huddle, MD   Brief Narrative: Krista Hodges is a 63 y.o. 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 Patient presented secondary to worsening confusion and found to have a cerebellar mass with vasogenic edema with concern for metastatic disease.   Assessment & Plan:   Active Problems:   Essential (primary) hypertension   Brain tumor ([place])   Hypokalemia   Hypothyroidism   Cerebellar mass with vasogenic edema Neurosurgery consulted and started decadron. Plan for surgery. CT scan of head/chest/abdomen/pelvis concerning for multiple areas of metastatic disease. S/p suboccipital craniectomy -Neurosurgery recommendations: dexamethasone, therapy  Essential hypertension Well controlled -Continue irbesartan  Lethargy Unknown if secondary to narcotic medication vs above problem. Neurosurgery managing. Resolved.  Sinus bradycardia Does not seem symptomatic but is lethargic at times. Bradycardia in the 30-40 bpm range at lowest. Cardiology consulted for what appeared to be episodes of tachycardia, however, assessment is that tachycardia is actually artifact. Currently stable with episodes of sinus bradycardia.  Hypokalemia Given supplementation with improvement. Magnesium normal.  Anemia Appears to be acute. Unsure if any source of bleeding at this time. Stable.  Hyperlipidemia -Continue Crestor  Hypothyroidism TSH of 75.67. On Synthroid 50 mcg as an outpatient which appears to have been decreased from 88 mcg. Started on Synthroid 88 mcg on admission. Patient states she was not taking her Synthroid regularly -Switch to Synthroid 75 mcg daily; recommend to continue Synthroid at increased dose of 75 mcg daily on  discharge (medication reconciliation completed). Will need repeat TSH testing in 4-6 weeks by PCP; sooner if she develops symptoms of hyperthyroidism. Discussed with patient on 12/28.  Depression -Continue Zoloft and Wellbutrin  Cognitive impairment -Continue Aricept and Namenda  Compression fractures T12 and L3 noted incidentally. TLSO brace ordered  History of papillary thyroid carcinoma/endometrial carcinoma/renal cell carcinoma  Follows at [facility]  Hyperglycemia Hemoglobin A1C of 5.6.  IV fluids discontinued. TRH will sign off. Please reconsult if we are needed. Thanks!   DVT prophylaxis: SCDs Code Status:   Code Status: Full Code Family Communication: None Disposition Plan: Discharge per primary    Procedures:   Craniectomy  Antimicrobials:  None    Subjective: No concerns. Eager to discharge  Objective: Vitals:   02/09/19 1921 02/09/19 2329 02/10/19 0300 02/10/19 0755  BP:    (!) 144/80  Pulse:    79  Resp:    18  Temp: 98.4 F (36.9 C) 98.1 F (36.7 C) 98 F (36.7 C) 97.9 F (36.6 C)  TempSrc: Oral Oral    SpO2:    100%  Weight:      Height:        Intake/Output Summary (Last 24 hours) at 02/10/2019 1040 Last data filed at 02/10/2019 0817 Gross per 24 hour  Intake 2169.56 ml  Output 900 ml  Net 1269.56 ml   Filed Weights   02/04/19 1152  Weight: 65 kg    Examination:  General exam: Appears calm and comfortable Respiratory system: Clear to auscultation. Respiratory effort normal. Cardiovascular system: S1 & S2 heard, RRR. No murmurs, rubs, gallops or clicks. Gastrointestinal system: Abdomen is nondistended, soft and nontender. No organomegaly or masses felt. Normal bowel sounds heard. Central nervous system: Alert and oriented. No focal neurological deficits. Extremities: No edema. No calf tenderness Skin: No cyanosis. No rashes Psychiatry:  Judgement and insight appear normal. Mood & affect appropriate.   Data Reviewed: I  have personally reviewed following labs and imaging studies  CBC: Recent Labs  Lab 02/03/19 1827 02/04/19 1244 02/04/19 1251 02/04/19 1307 02/05/19 0653 02/06/19 0800  WBC 13.0*  --   --  10.5 15.3* 13.6*  NEUTROABS 11.9*  --   --   --   --   --   HGB 11.0* 7.8* 8.5* 9.2* 9.2* 9.2*  HCT 34.4* 23.0* 25.0* 28.6* 28.7* 28.4*  MCV 96.1  --   --  95.3 95.3 95.9  PLT 332  --   --  309 296 [id number]   Basic Metabolic Panel: Recent Labs  Lab 02/03/19 1827 02/04/19 0342 02/04/19 1244 02/04/19 1251 02/05/19 0653 02/06/19 0424  NA 135 135 139 138 138 138  K 2.7* 3.4* 3.0* 3.3* 3.5 3.4*  CL 98 100  --   --  108 107  CO2 26 25  --   --  21* 20*  GLUCOSE 154* 130*  --   --  115* 135*  BUN 18 16  --   --  16 16  CREATININE 1.04* 0.92  --   --  0.87 0.81  CALCIUM 9.5 9.6  --   --  9.5 9.3  MG  --  1.8  --   --   --   --    GFR: Estimated Creatinine Clearance: 64.8 mL/min (by C-G formula based on SCr of 0.81 mg/dL). Liver Function Tests: Recent Labs  Lab 02/03/19 1827  AST 16  ALT 12  ALKPHOS 103  BILITOT 0.5  PROT 6.3*  ALBUMIN 3.2*   No results for input(s): LIPASE, AMYLASE in the last 168 hours. No results for input(s): AMMONIA in the last 168 hours. Coagulation Profile: Recent Labs  Lab 02/03/19 2151  INR 1.0   Cardiac Enzymes: Recent Labs  Lab 02/03/19 1827  CKTOTAL 46   BNP (last 3 results) No results for input(s): PROBNP in the last 8760 hours. HbA1C: No results for input(s): HGBA1C in the last 72 hours. CBG: Recent Labs  Lab 02/03/19 1803 02/09/19 0735 02/09/19 1238  GLUCAP 165* 114* 99   Lipid Profile: No results for input(s): CHOL, HDL, LDLCALC, TRIG, CHOLHDL, LDLDIRECT in the last 72 hours. Thyroid Function Tests: No results for input(s): TSH, T4TOTAL, FREET4, T3FREE, THYROIDAB in the last 72 hours. Anemia Panel: No results for input(s): VITAMINB12, FOLATE, FERRITIN, TIBC, IRON, RETICCTPCT in the last 72 hours. Sepsis Labs: No results for  input(s): PROCALCITON, LATICACIDVEN in the last 168 hours.  Recent Results (from the past 240 hour(s))  Respiratory Panel by RT PCR (Flu A&B, Covid) - Nasopharyngeal Swab     Status: None   Collection Time: 02/03/19  9:12 PM   Specimen: Nasopharyngeal Swab  Result Value Ref Range Status   SARS Coronavirus 2 by RT PCR NEGATIVE NEGATIVE Final    Comment: (NOTE) SARS-CoV-2 target nucleic acids are NOT DETECTED. The SARS-CoV-2 RNA is generally detectable in upper respiratoy specimens during the acute phase of infection. The lowest concentration of SARS-CoV-2 viral copies this assay can detect is 131 copies/mL. A negative result does not preclude SARS-Cov-2 infection and should not be used as the sole basis for treatment or other patient management decisions. A negative result may occur with  improper specimen collection/handling, submission of specimen other than nasopharyngeal swab, presence of viral mutation(s) within the areas targeted by this assay, and inadequate number of viral copies (<131 copies/mL). A negative result  must be combined with clinical observations, patient history, and epidemiological information. The expected result is Negative. Fact Sheet for Patients:  PinkCheek.be Fact Sheet for Healthcare Providers:  GravelBags.it This test is not yet ap proved or cleared by the [place] FDA and  has been authorized for detection and/or diagnosis of SARS-CoV-2 by FDA under an Emergency Use Authorization (EUA). This EUA will remain  in effect (meaning this test can be used) for the duration of the COVID-19 declaration under Section 564(b)(1) of the Act, 21 U.S.C. section 360bbb-3(b)(1), unless the authorization is terminated or revoked sooner.    Influenza A by PCR NEGATIVE NEGATIVE Final   Influenza B by PCR NEGATIVE NEGATIVE Final    Comment: (NOTE) The Xpert Xpress SARS-CoV-2/FLU/RSV assay is intended as an aid  in  the diagnosis of influenza from Nasopharyngeal swab specimens and  should not be used as a sole basis for treatment. Nasal washings and  aspirates are unacceptable for Xpert Xpress SARS-CoV-2/FLU/RSV  testing. Fact Sheet for Patients: PinkCheek.be Fact Sheet for Healthcare Providers: GravelBags.it This test is not yet approved or cleared by the [place] FDA and  has been authorized for detection and/or diagnosis of SARS-CoV-2 by  FDA under an Emergency Use Authorization (EUA). This EUA will remain  in effect (meaning this test can be used) for the duration of the  Covid-19 declaration under Section 564(b)(1) of the Act, 21  U.S.C. section 360bbb-3(b)(1), unless the authorization is  terminated or revoked. Performed at [facility], [place] [address], [place], [place] [address]   Culture, Urine     Status: Abnormal   Collection Time: 02/03/19 11:37 PM   Specimen: Urine, Random  Result Value Ref Range Status   Specimen Description URINE, RANDOM  Final   Special Requests   Final    NONE Performed at [facility], [place] [address], [place], [place] [address]    Culture >=100,000 COLONIES/mL ESCHERICHIA COLI (A)  Final   Report Status 02/06/2019 FINAL  Final   Organism ID, Bacteria ESCHERICHIA COLI (A)  Final      Susceptibility   Escherichia coli - MIC*    AMPICILLIN 8 SENSITIVE Sensitive     CEFAZOLIN <=4 SENSITIVE Sensitive     CEFTRIAXONE <=1 SENSITIVE Sensitive     CIPROFLOXACIN <=0.25 SENSITIVE Sensitive     GENTAMICIN <=1 SENSITIVE Sensitive     IMIPENEM <=0.25 SENSITIVE Sensitive     NITROFURANTOIN 64 INTERMEDIATE Intermediate     TRIMETH/SULFA <=20 SENSITIVE Sensitive     AMPICILLIN/SULBACTAM <=2 SENSITIVE Sensitive     PIP/TAZO <=4 SENSITIVE Sensitive     * >=100,000 COLONIES/mL ESCHERICHIA COLI  MRSA PCR Screening     Status: None   Collection Time: 02/04/19  8:06 PM   Specimen: Nasopharyngeal   Result Value Ref Range Status   MRSA by PCR NEGATIVE NEGATIVE Final    Comment:        The GeneXpert MRSA Assay (FDA approved for NASAL specimens only), is one component of a comprehensive MRSA colonization surveillance program. It is not intended to diagnose MRSA infection nor to guide or monitor treatment for MRSA infections. Performed at [facility], [place] [address], [place], [place] [address]          Radiology Studies: No results found.      Scheduled Meds: . buPROPion  200 mg Oral Daily  . busPIRone  15 mg Oral BID  . dexamethasone (DECADRON) injection  6 mg Intravenous Q6H  .  docusate sodium  100 mg Oral BID  . irbesartan  300 mg Oral Daily  . [START ON 02/11/2019] levothyroxine  75 mcg Oral Q0600  . pantoprazole  40 mg Oral Q2200  . pramipexole  0.5 mg Oral QHS  . rosuvastatin  10 mg Oral QHS  . senna  1 tablet Oral BID  . sertraline  100 mg Oral Daily   Continuous Infusions: . sodium chloride 200 mL/hr at 02/04/19 1557     LOS: 7 days     Cordelia Poche, MD [facility] 02/10/2019, 10:40 AM  If 7PM-7AM, please contact night-coverage [email or web address]

## 2019-02-10 NOTE — Plan of Care (Signed)
  Problem: Coping: Goal: Level of anxiety will decrease Outcome: Progressing Note: Pt gets very anxious when getting out to bed or with any ambulation. RN assisted with coping with condition and hospitalization RN also implemented reorientation process and emotional support. Will continue to provide encourage with out of bed activities during her hospital stay.

## 2019-02-11 DIAGNOSIS — F0390 Unspecified dementia without behavioral disturbance: Secondary | ICD-10-CM

## 2019-02-11 NOTE — Progress Notes (Signed)
  NEUROSURGERY PROGRESS NOTE   I came by this afternoon when patient's daughter-in-law was present to further discuss care.  Ken with PT was also present. Patient lives with her husband in an in-law suite attached to her son and DIL's home. Husband is able to care for self. Son & DIL have three kids, two of which are of age to help assist with patient's needs (HS and [employer] students). After a long discussion alongside PT, given fluctuating progress, we believe patient may benefit from CIR prior to discharge home instead of direct discharge home. I have consulted CIR for consideration. Daughter appreciative of care.  Ferne Reus, PA-C [facility] [employer]

## 2019-02-11 NOTE — Consult Note (Signed)
Physical Medicine and Rehabilitation Consult Reason for Consult: Decreased functional mobility Referring Physician: Dr. Kathyrn Sheriff   HPI: Krista Hodges is a 63 y.o. right-handed female with history of renal cancer with left radical laparoscopic nephrectomy 05/21/2014, endometrial adenocarcinoma 2017, hypothyroidism after XRT for papillary thyroid cancer, chronic fatigue and immune dysfunction syndrome, CKD stage III, remote tobacco abuse, early dementia maintained on Aricept as well as BuSpar.  History taken from chart review due to cognition and son taken from patient.  Patient states she lives with her spouse in a 1 level home.  They have a daughter-in-law who can assist as needed.  Patient ambulating with assistance prior to admission.  She presented on 02/03/2019 with gait instability.  Abdominal/cervical/chest CTs revealed multiple nodules diffusely suggestive of metastatic disease.  CT head personally reviewed, showing hemorrhagic mass with vasogenic edema.  She underwent suboccipital craniotomy resection of tumor on 02/04/2019 per Dr. Kathyrn Sheriff.  Placed on Keppra for seizure prophylaxis as well as Decadron protocol established.  Cardiology services consulted 02/06/2019 for tachy/brady and telemetry carefully reviewed no episodes of true tachycardia noted she did have some PVCs heart rate as low as 35 but not sustained.  No current indications for pacemaker and monitored.. Patient also found to have compression deformities at T12 and L3 as well as a lobulated mass lesion in the medial aspect of the right middle lobe with adjacent satellite nodules and mediastinal and right hilar adenopathy.  Patient was fitted with a TLSO back brace applied in sitting position.  Tolerating a regular diet.  Therapy evaluations completed with recommendations of physical medicine rehab consult.  Review of Systems  Constitutional: Negative for chills and fever.  HENT: Negative for hearing loss.   Eyes: Negative  for blurred vision and double vision.  Respiratory: Negative for cough and shortness of breath.   Cardiovascular: Negative for leg swelling.  Gastrointestinal: Positive for constipation. Negative for heartburn, nausea and vomiting.  Genitourinary: Negative for dysuria and hematuria.  Musculoskeletal: Positive for joint pain and myalgias.  Skin: Negative for rash.  Neurological: Positive for dizziness and tremors.       Gait instability Restless legs  Psychiatric/Behavioral: Positive for depression and memory loss.       Anxiety   Past Medical History:  Diagnosis Date  . Allergic rhinitis   . Anxiety state   . Arthritis, degenerative   . Atypical migraine   . Awareness of heartbeats   . Cancer of left kidney (HCC)    Left Kidney  . Cervical radiculitis   . CFIDS (chronic fatigue and immune dysfunction syndrome) ([place])   . Depression, major, single episode, in partial remission ([place])   . Depression, neurotic   . Elevated serum creatinine 03/2016   history of (2.07)  . Endometrial adenocarcinoma ([place])    Stage II grade2  . Essential (primary) hypertension   . Gastric ulcer   . Heart murmur    pt. denies  . HLD (hyperlipidemia)   . Inflammation of hand joint   . Memory difficulties   . Premature complex, ventricular   . Renal disorder   . Renal mass, left   . Restless leg   . Shortness of breath dyspnea    occasional  . 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 Tremor    L arm  . Vitamin D deficiency    Past Surgical History:  Procedure Laterality Date  . c sections x 3     . CARPAL  TUNNEL RELEASE     rt hand  . CRANIOTOMY N/A 02/04/2019   Procedure: Suboccipital CRANIECTOMY for  TUMOR EXCISION;  Surgeon: Consuella Lose, MD;  Location: [facility];  Service: Neurosurgery;  Laterality: N/A;  . LAPAROSCOPIC NEPHRECTOMY Left 05/21/2014   Procedure: LEFT RADICAL LAPAROSCOPIC NEPHRECTOMY;  Surgeon: Ardis Hughs, MD;  Location: [facility];  Service:  Urology;  Laterality: Left;  Marland Kitchen MANDIBLE SURGERY    . NEPHRECTOMY TRANSPLANTED ORGAN     Left kidney removed  . ROBOTIC ASSISTED TOTAL HYSTERECTOMY WITH BILATERAL SALPINGO OOPHERECTOMY  12/27/2015  . THYROIDECTOMY N/A 05/09/2017   Procedure: TOTAL THYROIDECTOMY WITH LIMITED CENTRAL COMPARTMENT LYMPH NODE DISSECTION;  Surgeon: Armandina Gemma, MD;  Location: [facility];  Service: General;  Laterality: N/A;  . TOTAL THYROIDECTOMY     05-09-17 Dr. Harlow Asa   Family History  Problem Relation Age of Onset  . Cancer Mother   . Heart attack Father   . Diabetes Other   . Dementia Neg Hx    Social History:  reports that she quit smoking about 11 years ago. Her smoking use included cigarettes. She smoked 1.00 pack per day. She has never used smokeless tobacco. She reports current alcohol use. She reports that she does not use drugs. Allergies:  Allergies  Allergen Reactions  . Penicillins Hives    [place]helts. Has tolerated cephazolin and cephalexin. Has patient had a PCN reaction causing immediate rash, facial/tongue/throat swelling, SOB or lightheadedness with hypotension: Yes (per pt report) Has patient had a PCN reaction causing severe rash involving mucus membranes or skin necrosis: Yes Has patient had a PCN reaction that required hospitalization: No Has patient had a PCN reaction occurring within the last 10 years: No If all of the above answers are "NO", then may proceed with Cephalosporin  . Aspirin     Cannot take due to nephrectomy  . Keppra [Levetiracetam] Nausea Only    Anxiety   . Nsaids     Cannot take due to nephrectomy  . Requip [Ropinirole Hcl]     Headaches, restlessness   Medications Prior to Admission  Medication Sig Dispense Refill  . acetaminophen (TYLENOL) 500 MG tablet Take 500-1,000 mg by mouth daily as needed for moderate pain or headache.    Marland Kitchen buPROPion (WELLBUTRIN) 100 MG tablet Take 100 mg by mouth daily.     . busPIRone (BUSPAR) 15 MG tablet Take 15 mg by mouth 2 (two)  times daily.    Marland Kitchen donepezil (ARICEPT) 10 MG tablet Take 10 mg by mouth at bedtime.     . hydrochlorothiazide (HYDRODIURIL) 25 MG tablet Take 25 mg by mouth daily.    . irbesartan (AVAPRO) 300 MG tablet Take 300 mg daily by mouth.     . levothyroxine (SYNTHROID) 50 MCG tablet Take 50 mcg by mouth daily.    . memantine (NAMENDA) 5 MG tablet Take 5 mg by mouth 2 (two) times daily.    . pramipexole (MIRAPEX) 0.5 MG tablet Take 0.5 mg by mouth at bedtime.    Marland Kitchen Propylene Glycol (SYSTANE BALANCE) 0.6 % SOLN Place 1 drop into both eyes daily.    . rosuvastatin (CRESTOR) 10 MG tablet Take 10 mg by mouth daily.     . sertraline (ZOLOFT) 100 MG tablet Take 50 mg by mouth 2 (two) times daily.    Marland Kitchen tiZANidine (ZANAFLEX) 4 MG tablet Take 4 mg by mouth as needed for muscle spasms.     . traMADol-acetaminophen (ULTRACET) 37.5-325 MG tablet Take 1 tablet  by mouth as needed for moderate pain.     . traZODone (DESYREL) 50 MG tablet Take 50-100 mg by mouth at bedtime as needed for sleep.     . calcium carbonate (TUMS) 500 MG chewable tablet Chew 2 tablets (400 mg of elemental calcium total) by mouth 2 (two) times daily. (Patient not taking: Reported on 02/03/2019) 90 tablet 1  . docusate sodium (COLACE) 100 MG capsule Take 1 capsule (100 mg total) by mouth 2 (two) times daily as needed (take to keep stool soft.). (Patient not taking: Reported on 02/03/2019) 60 capsule 0  . traMADol (ULTRAM) 50 MG tablet Take 1-2 tablets (50-100 mg total) by mouth every 6 (six) hours as needed for moderate pain (mild pain not responsive to acetaminophen). (Patient not taking: Reported on 02/03/2019) 15 tablet 0    Home: [place] expects to be discharged to:: Private residence Living Arrangements: Spouse/significant other Available Help at Discharge: Family, Available 24 hours/day Type of Home: Apartment Home Access: Level entry Home Layout: One level Bathroom Shower/Tub: Multimedia programmer:  Standard Home Equipment: Wheelchair - manual, Environmental consultant - 2 wheels, [place] - single point, Grab bars - tub/shower Additional Comments: daughter in law present and will be able to help at home. She thinks they have a 3n1 at home but uncertain  Functional History: Prior Function Level of Independence: Needs assistance Gait / Transfers Assistance Needed: was walking with assist grossly 20' then unable to walk for 2 weeks PTA ADL's / Homemaking Assistance Needed: bathing and dressing on her own until 2 weeks PtA and since that time assist for all Functional Status:  Mobility: Bed Mobility Overal bed mobility: Needs Assistance Bed Mobility: Rolling, Sidelying to Sit Rolling: Min assist Sidelying to sit: Mod assist General bed mobility comments: pt has improved over last treatment, but needs lots of tactile cuing and truncal assist slow enough for pt to engage R UE assist Transfers Overall transfer level: Needs assistance Equipment used: Rolling walker (2 wheeled) Transfers: Sit to/from Stand Sit to Stand: Min assist Stand pivot transfers: Min assist General transfer comment: repetitive/consistent cues for technique, direction and hand placement.  Assist to stay forward and minimal boost. Ambulation/Gait Ambulation/Gait assistance: Mod assist Gait Distance (Feet): 15 Feet(to bathroom, then 35 feet into hall and back to the chair.) Assistive device: Rolling walker (2 wheeled) Gait Pattern/deviations: Step-to pattern, Step-through pattern General Gait Details: short guarded steps, too far outside the RW. Consistently veering to the Left, needing assist to direct to the right.  Stiff and guarded overall. Gait velocity: slow Gait velocity interpretation: <1.31 ft/sec, indicative of household ambulator    ADL: ADL Overall ADL's : Needs assistance/impaired Grooming: Wash/dry hands, Moderate assistance, Standing Grooming Details (indicate cue type and reason): cues to avoid leaning over at sink  level onto elbow Upper Body Bathing: Minimal assistance Lower Body Bathing: Moderate assistance, Sit to/from stand Upper Body Dressing : Sitting, Maximal assistance Upper Body Dressing Details (indicate cue type and reason): assistance to don brace Toilet Transfer: Minimal assistance, Cueing for safety, Cueing for sequencing, Ambulation, RW Toilet Transfer Details (indicate cue type and reason): simulated from eob taking approx. 10 steps to recliner (very small short steps), cues for rw management, asks "did we make it tell me when i can sit" Toileting- Clothing Manipulation and Hygiene: Moderate assistance, Sit to/from stand Toileting - Clothing Manipulation Details (indicate cue type and reason): pt able to power up and attmepting to wipe with flexed posture with cues that squating is  required Functional mobility during ADLs: Minimal assistance, Rolling walker General ADL Comments: able to complete bed mobility, and simulated toilet transfer in room with eob and ambulation to recliner.  Cognition: Cognition Overall Cognitive Status: Impaired/Different from baseline(more interactive and focus, but not tested formally) Orientation Level: Oriented to person, Disoriented to place, Disoriented to situation, Disoriented to time Cognition Arousal/Alertness: Awake/alert Behavior During Therapy: WFL for tasks assessed/performed Overall Cognitive Status: Impaired/Different from baseline(more interactive and focus, but not tested formally) Area of Impairment: Orientation, Attention, Memory, Safety/judgement, Following commands, Awareness, Problem solving Orientation Level: Situation Current Attention Level: Sustained Memory: Decreased recall of precautions Following Commands: Follows one step commands with increased time Safety/Judgement: Decreased awareness of deficits Awareness: Emergent Problem Solving: Slow processing, Decreased initiation, Difficulty sequencing General Comments: max  directional verbal and tactile cues to complete task and for safety  Blood pressure 136/68, pulse (!) 51, temperature 97.7 F (36.5 C), temperature source Oral, resp. rate 13, height 5\' 6"  (1.676 m), weight 65 kg, SpO2 96 %. Physical Exam  Vitals reviewed. Constitutional: She appears well-developed and well-nourished.  HENT:  Head: Normocephalic and atraumatic.  Eyes: EOM are normal. Right eye exhibits no discharge. Left eye exhibits no discharge.  Neck: No tracheal deviation present. No thyromegaly present.  Respiratory: Effort normal. No stridor. No respiratory distress.  GI: She exhibits no distension.  Musculoskeletal:     Comments: No edema or tenderness in extremities  Neurological: She is alert.  Patient is alert and cooperative with exam.   She was able to provide her name but delay in processing for her age however she was able to give appropriate date of birth.   Follows simple commands.   Very limited medical historian. Motor: Grossly 4/5 throughout  Skin: Skin is warm and dry.  Psychiatric: Her affect is blunt. Her speech is delayed. She is slowed.    No results found for this or any previous visit (from the past 24 hour(s)). No results found.  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  Records reviewed and summated above.  1. Does the need for close, 24 hr/day medical supervision in concert with the patient's rehab needs make it unreasonable for this patient to be served in a less intensive setting? Yes  2. Co-Morbidities requiring supervision/potential complications: renal cancer with left radical laparoscopic nephrectomy 05/21/2014, endometrial adenocarcinoma 2017, hypothyroidism after XRT for papillary thyroid cancer, chronic fatigue and immune dysfunction syndrome, CKD stage III (repeat BMP, avoid nephrotoxic meds), remote tobacco abuse, early dementia,  hypokalemia (continue to monitor and replete as necessary), leukocytosis (likely steroid-induced, repeat labs, cont to monitor for signs and symptoms of infection, further workup if indicated), bradycardia (monitor with increased mobility) 3. Due to safety, skin/wound care, disease management, medication administration and patient education, does the patient require 24 hr/day rehab nursing? Yes 4. Does the patient require coordinated care of a physician, rehab nurse, therapy disciplines of PT/OT to address physical and functional deficits in the context of the above medical diagnosis(es)? Yes Addressing deficits in the following areas: balance, endurance, locomotion, strength, transferring, bathing, dressing, toileting, cognition and psychosocial support 5. Can the patient actively participate in an intensive therapy program of at least 3 hrs of therapy per day at least 5 days per week? Potentially 6. The potential for patient to make measurable gains while on inpatient rehab is good and fair 7. Anticipated functional outcomes upon discharge from inpatient rehab are min assist  with  PT, min assist with OT, n/a with SLP. 8. Estimated rehab length of stay to reach the above functional goals is: 17-20 days. 9. Anticipated discharge destination: TBD. 10. Overall Rehab/Functional Prognosis: fair and poor  RECOMMENDATIONS: This patient's condition is appropriate for continued rehabilitative care in the following setting: May consider CIR when patient medically stable and able to tolerate 3 hours of therapy per day.  Recommend Palliative care consult and Heme/Onc follow up with patient and family.  Patient has agreed to participate in recommended program. Potentially Note that insurance prior authorization may be required for reimbursement for recommended care.  Comment: Rehab Admissions Coordinator to follow up.  I have personally performed a face to face diagnostic evaluation, including, but not limited  to relevant history and physical exam findings, of this patient and developed relevant assessment and plan.  Additionally, I have reviewed and concur with the physician assistant's documentation above.   Delice Lesch, MD, ABPMR Lavon Paganini Angiulli, PA-C 02/12/2019

## 2019-02-11 NOTE — NC FL2 (Signed)
[place] LEVEL OF CARE SCREENING TOOL     IDENTIFICATION  Patient Name: Krista Hodges Birthdate: 1955-08-31 Sex: female Admission Date (Current Location): 02/03/2019  [facility] and [place] Number:  Herbalist and Address:  [facility], [place] [address] [place], [place] [address]      Provider Number: [id number]  Attending Physician Name and Address:  Consuella Lose, MD  Relative Name and Phone Number:  Marcine Matar- daughter- [phone]    Current Level of Care: Hospital Recommended Level of Care: [place] Prior Approval Number:    Date Approved/Denied:   PASRR Number: pending review  Discharge Plan: SNF    Current Diagnoses: Patient Active Problem List   Diagnosis Date Noted  . Dementia ([place]) 02/11/2019  . Brain tumor ([place]) 02/03/2019  . Hypokalemia 02/03/2019  . Hypothyroidism 02/03/2019  . Papillary thyroid carcinoma ([place]) 05/05/2017  . Tremor 01/31/2017  . Left renal mass 05/21/2014  . Idiopathic acute pancreatitis   . Urinary tract infectious disease   . Pancreatic mass   . Other acute pancreatitis   . Left kidney mass   . Acute pancreatitis 04/03/2014  . PVC (premature ventricular contraction) 10/20/2013  . Dyspnea 10/20/2013  . Allergic rhinitis 10/16/2013  . Anxiety state 10/16/2013  . Enthesopathy of hip 10/16/2013  . Cervical radiculitis 10/16/2013  . 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 CFIDS (chronic fatigue and immune dysfunction syndrome) ([place]) 10/16/2013  . Inflammation of hand joint 10/16/2013  . Atypical migraine 10/16/2013  . Excessive sweating, local 10/16/2013  . HLD (hyperlipidemia) 10/16/2013  . Depression, major, single episode, in partial remission ([place]) 10/16/2013  . Amnesia 10/16/2013  . Adiposity 10/16/2013  . Arthritis, degenerative 10/16/2013  . Restless leg 10/16/2013  . Adaptation reaction 10/16/2013  . Thrombophlebitis of superficial veins of lower  extremity 10/16/2013  . Essential (primary) hypertension 10/16/2013  . Avitaminosis D 10/16/2013  . Awareness of heartbeats 09/16/2013  . Premature complex, ventricular 09/16/2013    Orientation RESPIRATION BLADDER Height & Weight     Self, Time, Situation, Place  Normal Continent Weight: 65 kg Height:  5\' 6"  (167.6 cm)  BEHAVIORAL SYMPTOMS/MOOD NEUROLOGICAL BOWEL NUTRITION STATUS      Continent Diet  AMBULATORY STATUS COMMUNICATION OF NEEDS Skin   Limited Assist Verbally Surgical wounds                       Personal Care Assistance Level of Assistance  Bathing, Dressing Bathing Assistance: Limited assistance   Dressing Assistance: Limited assistance     Functional Limitations Info             SPECIAL CARE FACTORS FREQUENCY  PT (By licensed PT), OT (By licensed OT)     PT Frequency: 5x OT Frequency: 5x            Contractures Contractures Info: Not present    Additional Factors Info  Allergies   Allergies Info: PCN, Aspirin, Keppra, NSAIDS, Requip-           Current Medications (02/11/2019):  This is the current hospital active medication list Current Facility-Administered Medications  Medication Dose Route Frequency Provider Last Rate Last Admin  . 0.9 %  sodium chloride infusion   Intravenous Continuous Suzette Battiest, MD 200 mL/hr at 02/04/19 1557 New Bag at 02/04/19 1557  . acetaminophen (TYLENOL) tablet 650 mg  650 mg Oral Q4H PRN Traci Sermon, PA-C   650 mg at 02/10/19 1958  Or  . acetaminophen (TYLENOL) suppository 650 mg  650 mg Rectal Q4H PRN Costella, Vista Mink, PA-C      . bisacodyl (DULCOLAX) suppository 10 mg  10 mg Rectal Daily PRN Costella, Vista Mink, PA-C   10 mg at 02/09/19 0956  . buPROPion [facility]) tablet 200 mg  200 mg Oral Daily Costella, Vincent J, PA-C   200 mg at 02/11/19 1100  . busPIRone (BUSPAR) tablet 15 mg  15 mg Oral BID Costella, Vincent J, PA-C   15 mg at 02/11/19 1100  . dexamethasone (DECADRON)  injection 6 mg  6 mg Intravenous Q6H Jovita Gamma, MD   6 mg at 02/11/19 1103  . docusate sodium (COLACE) capsule 100 mg  100 mg Oral BID Costella, Vincent J, PA-C   100 mg at 02/11/19 1100  . doxylamine (Sleep) (UNISOM) tablet 25 mg  25 mg Oral QHS PRN Traci Sermon, PA-C   25 mg at 02/04/19 [id number]  . hydrALAZINE (APRESOLINE) injection 10 mg  10 mg Intravenous Q4H PRN Rise Patience, MD      . HYDROcodone-acetaminophen (NORCO/VICODIN) 5-325 MG per tablet 1 tablet  1 tablet Oral Q4H PRN Traci Sermon, PA-C   1 tablet at 02/11/19 [id number]  . HYDROmorphone (DILAUDID) injection 0.5-1 mg  0.5-1 mg Intravenous Q2H PRN Costella, Vista Mink, PA-C   1 mg at 02/10/19 1144  . irbesartan (AVAPRO) tablet 300 mg  300 mg Oral Daily Costella, Vincent J, PA-C   300 mg at 02/11/19 1100  . labetalol (NORMODYNE) injection 10-40 mg  10-40 mg Intravenous Q10 min PRN Costella, Vista Mink, PA-C      . levothyroxine (SYNTHROID) tablet 75 mcg  75 mcg Oral Q0600 Mariel Aloe, MD   75 mcg at 02/11/19 0606  . naloxone [facility]) injection 0.08 mg  0.08 mg Intravenous PRN Costella, Vista Mink, PA-C      . ondansetron (ZOFRAN) tablet 4 mg  4 mg Oral Q4H PRN Costella, Vista Mink, PA-C       Or  . ondansetron (ZOFRAN) injection 4 mg  4 mg Intravenous Q4H PRN Costella, Vista Mink, PA-C      . oxyCODONE (Oxy IR/ROXICODONE) immediate release tablet 5 mg  5 mg Oral Q4H PRN Costella, Vincent J, PA-C   5 mg at 02/11/19 0019  . pantoprazole (PROTONIX) EC tablet 40 mg  40 mg Oral Q2200 Alvira Philips, [place]   40 mg at 02/10/19 2106  . polyethylene glycol (MIRALAX / GLYCOLAX) packet 17 g  17 g Oral Daily PRN Traci Sermon, PA-C   17 g at 02/09/19 0539  . pramipexole (MIRAPEX) tablet 0.5 mg  0.5 mg Oral QHS Costella, Vincent J, PA-C   0.5 mg at 02/10/19 2106  . promethazine (PHENERGAN) tablet 12.5-25 mg  12.5-25 mg Oral Q4H PRN Costella, Vista Mink, PA-C      . rosuvastatin (CRESTOR) tablet 10 mg  10 mg Oral QHS  Costella, Vista Mink, PA-C   10 mg at 02/10/19 2106  . senna (SENOKOT) tablet 8.6 mg  1 tablet Oral BID Costella, Vincent J, PA-C   8.6 mg at 02/11/19 1100  . sertraline (ZOLOFT) tablet 100 mg  100 mg Oral Daily Costella, Vincent J, PA-C   100 mg at 02/11/19 1100  . sodium phosphate (FLEET) 7-19 GM/118ML enema 1 enema  1 enema Rectal Once PRN Costella, Vista Mink, PA-C      . traZODone (DESYREL) tablet 100 mg  100 mg Oral QHS PRN Costella, Evette Doffing  J, PA-C   100 mg at 02/07/19 2155     Discharge Medications: Please see discharge summary for a list of discharge medications.  Relevant Imaging Results:  Relevant Lab Results:   Additional Information SS#- [id number]  [name], RN   Patient will require 30 days of skilled services

## 2019-02-11 NOTE — TOC Initial Note (Signed)
Transition of Care (TOC) - Initial/Assessment Note  Krista Gibbons RN,BSN Transitions of Care Unit 4NP (non trauma) - RN Case Manager [phone]   Patient Details  Name: Krista Hodges MRN: [id number] Date of Birth: 1955/09/01  Transition of Care [place]) CM/SW Contact:    Krista Patricia, RN Phone Number: 02/11/2019, 4:54 PM  Clinical Narrative:                 Noted referral for SNF/transition planning needs- reached out to daughter Krista Hodges this AM to discuss PT eval recommendations and discuss family ideas/plans for discharge- Per TC pt lives in "inlaw" suite at son's home with spouse- spouse would not be able to provide needed assistance that pt currently needs per Krista Hodges and the rest of family works during the day. Krista Hodges would like discuss things with her dad and give return call.  Return call received around 1230 from [place] with pt's spouse also on the call regarding transition plans- they confirm that spouse would not be able to provide needed assistance and ask questions regarding STSNF placement for rehab needs. Questions answered and they would like to speak with rest of family- however are leaning towards STSNF with [facility] as first choice if bed available.  Call received again from [facility] and pt's spouse at 74- with verbal permission to go ahead and do SNF bed search- concern is that pt will be able to tolerate intensity of inpt rehab program- as she waxes and wanes with her tolerance with PT. Note that CIR has been consulted- will work on SNF as back up plan per conversation with family- [facility] remains first choice- have explained to family that if no bed available then will need to look at other bed offers as pt has been noted in MD note to be medically stable for discharge. Family voices understanding.  CM will f/u with CIR [facility] regarding INPT rehab consult and fax out FL2 for bed search.  PASRR- pending review-   Expected Discharge Plan: Skilled Nursing  Facility Barriers to Discharge: SNF Pending bed offer   Patient Goals and CMS Choice Patient states their goals for this hospitalization and ongoing recovery are:: rehab CMS Medicare.gov Compare Post Acute Care list provided to:: Patient Represenative (must comment)(spouse/daughter Krista Hodges) Choice offered to / list presented to : Spouse, Adult Children  Expected Discharge Plan and Services Expected Discharge Plan: [place] In-house Referral: Clinical Social Work Discharge Planning Services: CM Consult Post Acute Care Choice: [place] arrangements for the past 2 months: [place]                                      Prior Living Arrangements/Services Living arrangements for the past 2 months: Single Family Home Lives with:: Spouse Patient language and need for interpreter reviewed:: Yes Do you feel safe going back to the place where you live?: Yes      Need for Family Participation in Patient Care: Yes (Comment) Care giver support system in place?: Yes (comment)   Criminal Activity/Legal Involvement Pertinent to Current Situation/Hospitalization: No - Comment as needed  Activities of Daily Living   ADL Screening (condition at time of admission) Patient's cognitive ability adequate to safely complete daily activities?: No Is the patient deaf or have difficulty hearing?: No Does the patient have difficulty seeing, even when wearing glasses/contacts?: No Does the patient have difficulty concentrating, remembering,  or making decisions?: Yes Patient able to express need for assistance with ADLs?: Yes Does the patient have difficulty dressing or bathing?: Yes Independently performs ADLs?: No Communication: Independent Dressing (OT): Needs assistance Is this a change from baseline?: Change from baseline, expected to last <3days Grooming: Needs assistance Is this a change from baseline?: Change from baseline, expected to last <3  days Feeding: Independent Bathing: Needs assistance Is this a change from baseline?: Change from baseline, expected to last <3 days Toileting: Needs assistance Is this a change from baseline?: Change from baseline, expected to last <3 days In/Out Bed: Needs assistance Is this a change from baseline?: Change from baseline, expected to last <3 days Walks in Home: Needs assistance Is this a change from baseline?: Change from baseline, expected to last <3 days Does the patient have difficulty walking or climbing stairs?: Yes Weakness of Legs: Both Weakness of Arms/Hands: Both  Permission Sought/Granted Permission sought to share information with : Case Manager, Investment banker, corporate granted to share info w AGENCY: SNF        Emotional Assessment Appearance:: Appears stated age Attitude/Demeanor/Rapport: Gracious Affect (typically observed): Pleasant Orientation: : Oriented to Self, Oriented to  Time   Psych Involvement: No (comment)  Admission diagnosis:  Back pain [M54.9] Brain tumor ([place]) [D49.6] Weakness [R53.1] Patient Active Problem List   Diagnosis Date Noted  . Dementia ([place]) 02/11/2019  . Brain tumor ([place]) 02/03/2019  . Hypokalemia 02/03/2019  . Hypothyroidism 02/03/2019  . Papillary thyroid carcinoma ([place]) 05/05/2017  . Tremor 01/31/2017  . Left renal mass 05/21/2014  . Idiopathic acute pancreatitis   . Urinary tract infectious disease   . Pancreatic mass   . Other acute pancreatitis   . Left kidney mass   . Acute pancreatitis 04/03/2014  . PVC (premature ventricular contraction) 10/20/2013  . Dyspnea 10/20/2013  . Allergic rhinitis 10/16/2013  . Anxiety state 10/16/2013  . Enthesopathy of hip 10/16/2013  . Cervical radiculitis 10/16/2013  . 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 CFIDS (chronic fatigue and immune dysfunction syndrome) ([place]) 10/16/2013  . Inflammation of hand joint 10/16/2013  . Atypical migraine 10/16/2013  .  Excessive sweating, local 10/16/2013  . HLD (hyperlipidemia) 10/16/2013  . Depression, major, single episode, in partial remission ([place]) 10/16/2013  . Amnesia 10/16/2013  . Adiposity 10/16/2013  . Arthritis, degenerative 10/16/2013  . Restless leg 10/16/2013  . Adaptation reaction 10/16/2013  . Thrombophlebitis of superficial veins of lower extremity 10/16/2013  . Essential (primary) hypertension 10/16/2013  . Avitaminosis D 10/16/2013  . Awareness of heartbeats 09/16/2013  . Premature complex, ventricular 09/16/2013   PCP:  Josetta Huddle, MD Pharmacy:   [facility], [address] [place] [address] Phone: [phone] Fax: [phone]     Social Determinants of Health (SDOH) Interventions    Readmission Risk Interventions No flowsheet data found.

## 2019-02-11 NOTE — Progress Notes (Signed)
Orthopedic Tech Progress Note Patient Details:  Krista Hodges [birth year] [id number] Called in order to [facility] for a TLSO Patient ID: Krista Hodges, female   DOB: Aug 21, 1955, 63 y.o.   MRN: [id number]   [name] 02/11/2019, 4:14 PM

## 2019-02-11 NOTE — Progress Notes (Signed)
Physical Therapy Treatment Patient Details Name: Krista Hodges MRN: [id number] DOB: [birth year] Today's Date: 02/11/2019    History of Present Illness 63 yo admitted with falls and weakness found to have T12 and L3 fx to be treated with TLSO for OOB and cerebellar mass s/p craniotomy with MRI also demonstrating multiple breast, lung, adrenal and possible liver mass. PMHx: HTN, depression, HLD and multiple cancers including ?RCC s/p nephrectomy around 2015, endometrial adenocarcinoma 2017, papillary thyroid carcinoma 2019 s/p radiation. Pt also recently diagnosed with dementia.    PT Comments    Therapist, pt, dtr in law and PA in discussion that pt not ready to go home due to rough hospital course.  Emphasis on transitions, scooting, sitting balance while brace donned, sit to stand, standing balance and progressing gait.  Pt is more interactive, but is stiff, halting of movement and anxious about falling.    Follow Up Recommendations  CIR;Supervision/Assistance - 24 hour;Other (comment)(pt has not progressed as expected, course has been Copy  3in1 (PT)(TBA)    Recommendations for Other Services       Precautions / Restrictions Precautions Precautions: Fall;Back Precaution Booklet Issued: Yes (comment) Required Braces or Orthoses: Spinal Brace Spinal Brace: Thoracolumbosacral orthotic;Applied in sitting position;Other (comment)(getting another TLSO other than vertalign.)    Mobility  Bed Mobility Overal bed mobility: Needs Assistance Bed Mobility: Rolling;Sidelying to Sit Rolling: Min assist Sidelying to sit: Mod assist       General bed mobility comments: pt has improved over last treatment, but needs lots of tactile cuing and truncal assist slow enough for pt to engage R UE assist  Transfers Overall transfer level: Needs assistance Equipment used: Rolling walker (2 wheeled) Transfers: Sit to/from Stand Sit to Stand: Min assist          General transfer comment: repetitive/consistent cues for technique, direction and hand placement.  Assist to stay forward and minimal boost.  Ambulation/Gait Ambulation/Gait assistance: Mod assist Gait Distance (Feet): 15 Feet(to bathroom, then 35 feet into hall and back to the chair.) Assistive device: Rolling walker (2 wheeled) Gait Pattern/deviations: Step-to pattern;Step-through pattern Gait velocity: slow Gait velocity interpretation: <1.31 ft/sec, indicative of household ambulator General Gait Details: short guarded steps, too far outside the RW. Consistently veering to the Left, needing assist to direct to the right.  Stiff and guarded overall.   Stairs             Wheelchair Mobility    Modified Rankin (Stroke Patients Only)       Balance Overall balance assessment: Needs assistance Sitting-balance support: Single extremity supported;Bilateral upper extremity supported Sitting balance-Leahy Scale: Fair       Standing balance-Leahy Scale: Poor Standing balance comment: required AD and external support                            Cognition Arousal/Alertness: Awake/alert Behavior During Therapy: WFL for tasks assessed/performed Overall Cognitive Status: Impaired/Different from baseline(more interactive and focus, but not tested formally)                                        Exercises      General Comments General comments (skin integrity, edema, etc.): pt has had a rough course with "hills and valleys".  On overly "flat" days pt needs much more consistent cuing and assist.  Today, she was a little more interactive      Pertinent Vitals/Pain Pain Assessment: Faces Faces Pain Scale: Hurts little more Pain Location: back Pain Descriptors / Indicators: Aching;Discomfort Pain Intervention(s): Monitored during session    Home Living                      Prior Function            PT Goals (current goals can now  be found in the care plan section) Acute Rehab PT Goals Patient Stated Goal: home PT Goal Formulation: With patient/family Time For Goal Achievement: 02/16/19 Potential to Achieve Goals: Good Progress towards PT goals: Progressing toward goals(course has be up and down with slow progression.)    Frequency    Min 4X/week      PT Plan Current plan remains appropriate    Co-evaluation              AM-PAC PT "6 Clicks" Mobility   Outcome Measure  Help needed turning from your back to your side while in a flat bed without using bedrails?: A Lot Help needed moving from lying on your back to sitting on the side of a flat bed without using bedrails?: A Lot Help needed moving to and from a bed to a chair (including a wheelchair)?: A Lot Help needed standing up from a chair using your arms (e.g., wheelchair or bedside chair)?: A Lot Help needed to walk in hospital room?: A Lot Help needed climbing 3-5 steps with a railing? : A Lot 6 Click Score: 12    End of Session Equipment Utilized During Treatment: Back brace Activity Tolerance: Patient tolerated treatment well;Patient limited by fatigue Patient left: in bed;with call bell/phone within reach;with bed alarm set Nurse Communication: Mobility status;Precautions PT Visit Diagnosis: Other abnormalities of gait and mobility (R26.89);Pain;Other symptoms and signs involving the nervous system (R29.898) Pain - Right/Left: (back) Pain - part of body: (back)     Time: [id number] PT Time Calculation (min) (ACUTE ONLY): 39 min  Charges:  $Gait Training: 8-22 mins $Therapeutic Activity: 8-22 mins $Self Care/Home Management: 8-22                     02/11/2019  Ginger Carne., PT Acute Rehabilitation Services [phone]  (pager) [phone]  (office)   [name] 02/11/2019, 2:50 PM

## 2019-02-11 NOTE — Progress Notes (Signed)
Occupational Therapy Treatment Patient Details Name: Krista Hodges MRN: [id number] DOB: 03-21-1955 Today's Date: 02/11/2019    History of present illness 63 yo admitted with falls and weakness found to have T12 and L3 fx to be treated with TLSO for OOB and cerebellar mass s/p craniotomy with MRI also demonstrating multiple breast, lung, adrenal and possible liver mass. PMHx: HTN, depression, HLD and multiple cancers including ?RCC s/p nephrectomy around 2015, endometrial adenocarcinoma 2017, papillary thyroid carcinoma 2019 s/p radiation. Pt also recently diagnosed with dementia.   OT comments  Pt. Seen for skilled OT treatment.  Able to complete bed mobility, and transfer eob to recliner min a.  Increased time for task initiation and completion with pt. Requiring multiple cues and repeated cues for safety and sequencing of hand and foot placement during transfers and functional mobility.    Follow Up Recommendations  Home health OT    Equipment Recommendations  3 in 1 bedside commode;Tub/shower seat    Recommendations for Other Services      Precautions / Restrictions Precautions Precautions: Fall;Back Precaution Booklet Issued: Yes (comment) Required Braces or Orthoses: Spinal Brace Spinal Brace: Thoracolumbosacral orthotic;Applied in sitting position       Mobility Bed Mobility Overal bed mobility: Needs Assistance Bed Mobility: Sidelying to Sit;Rolling Rolling: Min guard Sidelying to sit: Min guard;HOB elevated       General bed mobility comments: educated on use of bed rails to guide the bed mobility. pt. able to reach for R bed rail with L hand and initiate the roll while scooting b les towards eob.  pt. then able to push trunk into upright sitting eob.  initated scoots with cues to get both feet on the floor  Transfers Overall transfer level: Needs assistance Equipment used: Rolling walker (2 wheeled) Transfers: Sit to/from [facility] Sit to Stand:  Min guard Stand pivot transfers: Min assist       General transfer comment: repetitive/consistent cues for technique, direction and hand placement.  "i dont think i can do this" then was able to come into standing. pt. was very pleased!    Balance                                           ADL either performed or assessed with clinical judgement   ADL Overall ADL's : Needs assistance/impaired                 Upper Body Dressing : Sitting;Maximal assistance Upper Body Dressing Details (indicate cue type and reason): assistance to don brace     Toilet Transfer: Minimal assistance;Cueing for safety;Cueing for sequencing;Ambulation;RW Toilet Transfer Details (indicate cue type and reason): simulated from eob taking approx. 10 steps to recliner (very small short steps), cues for rw management, asks "did we make it tell me when i can sit"         Functional mobility during ADLs: Minimal assistance;Rolling walker General ADL Comments: able to complete bed mobility, and simulated toilet transfer in room with eob and ambulation to recliner.     Vision       Perception     Praxis      Cognition Arousal/Alertness: Awake/alert Behavior During Therapy: [facility] for tasks assessed/performed  Exercises     Shoulder Instructions       General Comments      Pertinent Vitals/ Pain       Pain Assessment: 0-10 Pain Score: 6  Pain Location: back Pain Descriptors / Indicators: Aching;Discomfort Pain Intervention(s): Limited activity within patient's tolerance;Monitored during session;Repositioned  Home Living                                          Prior Functioning/Environment              Frequency  Min 2X/week        Progress Toward Goals  OT Goals(current goals can now be found in the care plan section)  Progress towards OT goals: Progressing toward goals      Plan Discharge plan remains appropriate    Co-evaluation                 AM-PAC OT "6 Clicks" Daily Activity     Outcome Measure   Help from another person eating meals?: A Little Help from another person taking care of personal grooming?: A Little Help from another person toileting, which includes using toliet, bedpan, or urinal?: A Lot Help from another person bathing (including washing, rinsing, drying)?: A Lot Help from another person to put on and taking off regular upper body clothing?: A Little Help from another person to put on and taking off regular lower body clothing?: A Lot 6 Click Score: 15    End of Session Equipment Utilized During Treatment: Gait belt;Rolling walker;Back brace  OT Visit Diagnosis: Unsteadiness on feet (R26.81);Muscle weakness (generalized) (M62.81)   Activity Tolerance Patient tolerated treatment well   Patient Left in chair;with call bell/phone within reach   Nurse Communication Other (comment)(spoke with rn that pt. was up in the chair with brace on and that she was wanting to talk with her husband.)        Time: [id number] OT Time Calculation (min): 17 min  Charges: OT General Charges $OT Visit: 1 Visit OT Treatments $Self Care/Home Management : 8-22 mins  Tanya Nones, COTA/L 02/11/2019, 10:02 AM

## 2019-02-11 NOTE — Progress Notes (Addendum)
  NEUROSURGERY PROGRESS NOTE   No issues overnight.  Vertalign TLSO is uncomfortable. Denies radicular symptoms Mild HA. No dizziness, changes in vision, new weakness.  EXAM:  BP (!) 142/81   Pulse (!) 52   Temp 98.1 F (36.7 C) (Oral)   Resp 13   Ht 5\' 6"  (1.676 m)   Wt 65 kg   SpO2 100%   BMI 23.13 kg/m   Awake, alert Speech fluent, appropriate  CN grossly intact  5/5 BUE/BLE, no drift Mild swelling/fluctuance superior aspect of incision without drainage. No signs of infection. Incision well approximated.  IMPRESSION/PLAN Stable neurologically. 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 Not draining. No plan for intervention at present. Continue to monitor.   T12/L3 fracture: remains neuro intact. Switch from vertalign to standard TLSO for comfort. Same precautions apply.  Dispo planning.

## 2019-02-12 DIAGNOSIS — E876 Hypokalemia: Secondary | ICD-10-CM

## 2019-02-12 DIAGNOSIS — D72829 Elevated white blood cell count, unspecified: Secondary | ICD-10-CM

## 2019-02-12 DIAGNOSIS — N183 Chronic kidney disease, stage 3 unspecified: Secondary | ICD-10-CM

## 2019-02-12 DIAGNOSIS — C541 Malignant neoplasm of endometrium: Secondary | ICD-10-CM

## 2019-02-12 DIAGNOSIS — E039 Hypothyroidism, unspecified: Secondary | ICD-10-CM

## 2019-02-12 DIAGNOSIS — Z85528 Personal history of other malignant neoplasm of kidney: Secondary | ICD-10-CM

## 2019-02-12 DIAGNOSIS — D72828 Other elevated white blood cell count: Secondary | ICD-10-CM

## 2019-02-12 DIAGNOSIS — D8989 Other specified disorders involving the immune mechanism, not elsewhere classified: Secondary | ICD-10-CM

## 2019-02-12 DIAGNOSIS — F0280 Dementia in other diseases classified elsewhere without behavioral disturbance: Secondary | ICD-10-CM

## 2019-02-12 DIAGNOSIS — I10 Essential (primary) hypertension: Secondary | ICD-10-CM

## 2019-02-12 DIAGNOSIS — D496 Neoplasm of unspecified behavior of brain: Secondary | ICD-10-CM

## 2019-02-12 DIAGNOSIS — E034 Atrophy of thyroid (acquired): Secondary | ICD-10-CM

## 2019-02-12 DIAGNOSIS — R5382 Chronic fatigue, unspecified: Secondary | ICD-10-CM

## 2019-02-12 DIAGNOSIS — Z905 Acquired absence of kidney: Secondary | ICD-10-CM

## 2019-02-12 LAB — SURGICAL PATHOLOGY

## 2019-02-12 MED ORDER — DEXAMETHASONE 4 MG PO TABS
4.0000 mg | ORAL_TABLET | Freq: Two times a day (BID) | ORAL | Status: DC
  Administered 2019-02-12 – 2019-02-16 (×9): 4 mg via ORAL
  Filled 2019-02-12 (×9): qty 1

## 2019-02-12 MED ORDER — HEPARIN SODIUM (PORCINE) 5000 UNIT/ML IJ SOLN
5000.0000 [IU] | Freq: Three times a day (TID) | INTRAMUSCULAR | Status: DC
  Administered 2019-02-12 – 2019-02-16 (×14): 5000 [IU] via SUBCUTANEOUS
  Filled 2019-02-12 (×14): qty 1

## 2019-02-12 NOTE — Progress Notes (Signed)
Inpatient Rehab Admissions:  Inpatient Rehab Consult received.  I met with patient at the bedside for rehabilitation assessment and to discuss goals and expectations of an inpatient rehab admission.  I also discussed with her family, at length, CIR versus SNF.  At this time, all are in agreement to proceed with seeking insurance authorization for possible CIR admit.  I did let them know that insurance companies are closed tomorrow, so if I don't hear back today, I won't be able to admit until Monday, at the earliest, with insurance auth.  Will open insurance for possible admission today/tomorrow pending authorization and bed availability.   Signed: Shann Medal, PT, DPT Admissions Coordinator [phone] 02/12/19  10:59 AM

## 2019-02-12 NOTE — Progress Notes (Signed)
Neurosurgery Service Progress Note  Subjective: No acute events overnight, no new complaints   Objective: Vitals:   02/12/19 0005 02/12/19 0046 02/12/19 0400 02/12/19 0726  BP: (!) 151/76  (!) 143/72 136/68  Pulse:    (!) 51  Resp: _0 Temp:   97.8 F (36.6 C) 97.7 F (36.5 C)  TempSrc:   Oral Oral  SpO2:    96%  Weight:      Height:       Temp (24hrs), Avg:98 F (36.7 C), Min:97.5 F (36.4 C), Max:98.5 F (36.9 C)  CBC Latest Ref Rng & Units 02/06/2019 02/05/2019 02/04/2019  WBC 4.0 - 10.5 K/uL 13.6(H) 15.3(H) 10.5  Hemoglobin 12.0 - 15.0 g/dL 9.2(L) 9.2(L) 9.2(L)  Hematocrit 36.0 - 46.0 % 28.4(L) 28.7(L) 28.6(L)  Platelets 150 - 400 K/uL 259 296 309   BMP Latest Ref Rng & Units 02/06/2019 02/05/2019 02/04/2019  Glucose 70 - 99 mg/dL 135(H) 115(H) -  BUN 8 - 23 mg/dL 16 16 -  Creatinine 0.44 - 1.00 mg/dL 0.81 0.87 -  Sodium 135 - 145 mmol/L 138 138 138  Potassium 3.5 - 5.1 mmol/L 3.4(L) 3.5 3.3(L)  Chloride 98 - 111 mmol/L 107 108 -  CO2 22 - 32 mmol/L 20(L) 21(L) -  Calcium 8.9 - 10.3 mg/dL 9.3 9.5 -    Intake/Output Summary (Last 24 hours) at 02/12/2019 0851 Last data filed at 02/12/2019 0558 Gross per 24 hour  Intake 240 ml  Output 1350 ml  Net -1110 ml    Current Facility-Administered Medications:  .  0.9 %  sodium chloride infusion, , Intravenous, Continuous, Suzette Battiest, MD, Last Rate: 200 mL/hr at 02/04/19 1557, New Bag at 02/04/19 1557 .  acetaminophen (TYLENOL) tablet 650 mg, 650 mg, Oral, Q4H PRN, 650 mg at 02/12/19 0046 **OR** acetaminophen (TYLENOL) suppository 650 mg, 650 mg, Rectal, Q4H PRN, Costella, Vincent J, PA-C .  bisacodyl (DULCOLAX) suppository 10 mg, 10 mg, Rectal, Daily PRN, Costella, Vincent J, PA-C, 10 mg at 02/09/19 0956 .  buPROPion [facility]) tablet 200 mg, 200 mg, Oral, Daily, Costella, Vincent J, PA-C, 200 mg at 02/11/19 1100 .  busPIRone (BUSPAR) tablet 15 mg, 15 mg, Oral, BID, Costella, Vista Mink, PA-C, 15 mg at  02/11/19 2136 .  dexamethasone (DECADRON) injection 6 mg, 6 mg, Intravenous, Q6H, Jovita Gamma, MD, 6 mg at 02/12/19 0036 .  docusate sodium (COLACE) capsule 100 mg, 100 mg, Oral, BID, Costella, Vista Mink, PA-C, 100 mg at 02/11/19 2135 .  doxylamine (Sleep) (UNISOM) tablet 25 mg, 25 mg, Oral, QHS PRN, Costella, Vincent J, PA-C, 25 mg at 02/04/19 8338 .  hydrALAZINE (APRESOLINE) injection 10 mg, 10 mg, Intravenous, Q4H PRN, Rise Patience, MD .  HYDROcodone-acetaminophen (NORCO/VICODIN) 5-325 MG per tablet 1 tablet, 1 tablet, Oral, Q4H PRN, Costella, Vista Mink, PA-C, 1 tablet at 02/11/19 2020 .  HYDROmorphone (DILAUDID) injection 0.5-1 mg, 0.5-1 mg, Intravenous, Q2H PRN, Costella, Vincent J, PA-C, 1 mg at 02/10/19 1144 .  irbesartan (AVAPRO) tablet 300 mg, 300 mg, Oral, Daily, Costella, Vincent J, PA-C, 300 mg at 02/11/19 1100 .  labetalol (NORMODYNE) injection 10-40 mg, 10-40 mg, Intravenous, Q10 min PRN, Costella, Vista Mink, PA-C .  levothyroxine (SYNTHROID) tablet 75 mcg, 75 mcg, Oral, Q0600, Mariel Aloe, MD, 75 mcg at 02/12/19 0556 .  naloxone [facility]) injection 0.08 mg, 0.08 mg, Intravenous, PRN, Costella, Vincent J, PA-C .  ondansetron (ZOFRAN) tablet 4 mg, 4 mg, Oral, Q4H PRN **OR** ondansetron (ZOFRAN) injection  4 mg, 4 mg, Intravenous, Q4H PRN, Costella, Vincent J, PA-C .  oxyCODONE (Oxy IR/ROXICODONE) immediate release tablet 5 mg, 5 mg, Oral, Q4H PRN, Costella, Vincent J, PA-C, 5 mg at 02/11/19 0019 .  pantoprazole (PROTONIX) EC tablet 40 mg, 40 mg, Oral, Q2200, Alvira Philips, [place], 40 mg at 02/11/19 2135 .  polyethylene glycol (MIRALAX / GLYCOLAX) packet 17 g, 17 g, Oral, Daily PRN, Costella, Vincent J, PA-C, 17 g at 02/09/19 0539 .  pramipexole (MIRAPEX) tablet 0.5 mg, 0.5 mg, Oral, QHS, Costella, Vincent J, PA-C, 0.5 mg at 02/11/19 2136 .  promethazine (PHENERGAN) tablet 12.5-25 mg, 12.5-25 mg, Oral, Q4H PRN, Costella, Vincent J, PA-C .  rosuvastatin (CRESTOR) tablet  10 mg, 10 mg, Oral, QHS, Costella, Vincent J, PA-C, 10 mg at 02/11/19 2136 .  senna (SENOKOT) tablet 8.6 mg, 1 tablet, Oral, BID, Costella, Vista Mink, PA-C, 8.6 mg at 02/11/19 2136 .  sertraline (ZOLOFT) tablet 100 mg, 100 mg, Oral, Daily, Costella, Vincent J, PA-C, 100 mg at 02/11/19 1100 .  sodium phosphate (FLEET) 7-19 GM/118ML enema 1 enema, 1 enema, Rectal, Once PRN, Costella, Vista Mink, PA-C .  traZODone (DESYREL) tablet 100 mg, 100 mg, Oral, QHS PRN, Costella, Vista Mink, PA-C, 100 mg at 02/07/19 2155   Physical Exam: AOx3, PERRL, EOMI, FS, Strength 5/5 x4, SILTx4, +RUE dysmetria, incision c/d/i with superior pseudomeningocele, no drainage  Assessment & Plan: 63 y.o. woman  s/p suboccip for met, NAE ON.  -PT/OT rec CIR -has been on dex 6q6 for 6d, will start rapid taper -PM&R c/s, possible CIR transfer pending screening, etc. -TLSO for T12/L3 frx, activity as tolerated in brace when out of bed -SCDs/TEDs, will start [facility] for DVT PPx  Judith Part  02/12/19 8:51 AM

## 2019-02-12 NOTE — Progress Notes (Signed)
Inpatient Rehab Admissions Coordinator:   I have not heard from pt's insurance today.  Will f/u with pt on Monday if she remains in house.   Shann Medal, PT, DPT Admissions Coordinator [phone] 02/12/19  4:30 PM

## 2019-02-12 NOTE — H&P (Signed)
Physical Medicine and Rehabilitation Admission H&P    Chief Complaint  Patient presents with  . Fall  : HPI: Krista Hodges is a 63 year old right-handed female with history of renal cancer with left radical laparoscopic nephrectomy 05/21/2014, endometrial adenocarcinoma 2017, hypothyroidism after XRT for papillary thyroid cancer, chronic fatigue and immune dysfunction syndrome, , remote tobacco use, early dementia maintained on Aricept as well as BuSpar.  History taken from chart review due to cognition and son taken from patient.  Patient states she lives with spouse 1 level home.  They have a daughter-in-law who can assist as needed.  Patient ambulating with assistance prior to admission.  Presented 02/03/2019 with gait instability.  Abdominal cervical chest CTs revealed multiple nodules diffusely suggestive of metastatic disease.  CT of the head reviewed showing hemorrhagic mass with vasogenic edema.  She underwent suboccipital craniotomy resection of tumor on 02/04/2019 per Dr. Kathyrn Sheriff.  Placed on Keppra for seizure prophylaxis as well as Decadron protocol established.  Subcutaneous heparin for DVT prophylaxis 02/12/2019.  Cardiology services consulted 02/06/2019 for tachybradycardia syndrome and telemetry carefully reviewed showing no episodes of true tachycardia noted she did have some PVCs heart rate as low as 35 but not sustained.  No current indications for pacemaker and monitored.  Patient also found to have compression fractures at T12 and L3 as well as lobulated mass lesion in the medial aspect of the middle right lobe with adjacent satellite nodules and mediastinal and right hilar adenopathy.  She was fitted with a TLSO back brace applied in sitting position.  Tolerating a regular diet.  Therapy evaluations completed and patient was admitted for a comprehensive rehab program.  Review of Systems  Constitutional: Negative for chills and fever.  HENT: Negative for hearing loss.   Eyes:  Negative for blurred vision and double vision.  Respiratory: Negative for cough and shortness of breath.   Cardiovascular: Negative for chest pain, palpitations and leg swelling.  Gastrointestinal: Positive for constipation. Negative for heartburn, nausea and vomiting.  Genitourinary: Negative for dysuria, flank pain and hematuria.  Musculoskeletal: Positive for joint pain and myalgias.  Skin: Negative for rash.  Neurological: Positive for dizziness and tremors.       Restless legs  Psychiatric/Behavioral: Positive for depression and memory loss.       Anxiety  All other systems reviewed and are negative.  Past Medical History:  Diagnosis Date  . Allergic rhinitis   . Anxiety state   . Arthritis, degenerative   . Atypical migraine   . Awareness of heartbeats   . Cancer of left kidney (HCC)    Left Kidney  . Cervical radiculitis   . CFIDS (chronic fatigue and immune dysfunction syndrome) ([place])   . Depression, major, single episode, in partial remission ([place])   . Depression, neurotic   . Elevated serum creatinine 03/2016   history of (2.07)  . Endometrial adenocarcinoma ([place])    Stage II grade2  . Essential (primary) hypertension   . Gastric ulcer   . Heart murmur    pt. denies  . HLD (hyperlipidemia)   . Inflammation of hand joint   . Memory difficulties   . Premature complex, ventricular   . Renal disorder   . Renal mass, left   . Restless leg   . Shortness of breath dyspnea    occasional  . 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 Tremor    L arm  . Vitamin D deficiency    Past  Surgical History:  Procedure Laterality Date  . c sections x 3     . CARPAL TUNNEL RELEASE     rt hand  . CRANIOTOMY N/A 02/04/2019   Procedure: Suboccipital CRANIECTOMY for  TUMOR EXCISION;  Surgeon: Consuella Lose, MD;  Location: [facility];  Service: Neurosurgery;  Laterality: N/A;  . LAPAROSCOPIC NEPHRECTOMY Left 05/21/2014   Procedure: LEFT RADICAL LAPAROSCOPIC  NEPHRECTOMY;  Surgeon: Ardis Hughs, MD;  Location: [facility];  Service: Urology;  Laterality: Left;  Marland Kitchen MANDIBLE SURGERY    . NEPHRECTOMY TRANSPLANTED ORGAN     Left kidney removed  . ROBOTIC ASSISTED TOTAL HYSTERECTOMY WITH BILATERAL SALPINGO OOPHERECTOMY  12/27/2015  . THYROIDECTOMY N/A 05/09/2017   Procedure: TOTAL THYROIDECTOMY WITH LIMITED CENTRAL COMPARTMENT LYMPH NODE DISSECTION;  Surgeon: Armandina Gemma, MD;  Location: [facility];  Service: General;  Laterality: N/A;  . TOTAL THYROIDECTOMY     05-09-17 Dr. Harlow Asa   Family History  Problem Relation Age of Onset  . Cancer Mother   . Heart attack Father   . Diabetes Other   . Dementia Neg Hx    Social History:  reports that she quit smoking about 11 years ago. Her smoking use included cigarettes. She smoked 1.00 pack per day. She has never used smokeless tobacco. She reports current alcohol use. She reports that she does not use drugs. Allergies:  Allergies  Allergen Reactions  . Penicillins Hives    Whelts. Has tolerated cephazolin and cephalexin. Has patient had a PCN reaction causing immediate rash, facial/tongue/throat swelling, SOB or lightheadedness with hypotension: Yes (per pt report) Has patient had a PCN reaction causing severe rash involving mucus membranes or skin necrosis: Yes Has patient had a PCN reaction that required hospitalization: No Has patient had a PCN reaction occurring within the last 10 years: No If all of the above answers are "NO", then may proceed with Cephalosporin  . Aspirin     Cannot take due to nephrectomy  . Keppra [Levetiracetam] Nausea Only    Anxiety   . Nsaids     Cannot take due to nephrectomy  . Requip [Ropinirole Hcl]     Headaches, restlessness   Medications Prior to Admission  Medication Sig Dispense Refill  . acetaminophen (TYLENOL) 500 MG tablet Take 500-1,000 mg by mouth daily as needed for moderate pain or headache.    Marland Kitchen buPROPion (WELLBUTRIN) 100 MG tablet Take 100 mg by mouth  daily.     . busPIRone (BUSPAR) 15 MG tablet Take 15 mg by mouth 2 (two) times daily.    Marland Kitchen donepezil (ARICEPT) 10 MG tablet Take 10 mg by mouth at bedtime.     . hydrochlorothiazide (HYDRODIURIL) 25 MG tablet Take 25 mg by mouth daily.    . irbesartan (AVAPRO) 300 MG tablet Take 300 mg daily by mouth.     . levothyroxine (SYNTHROID) 50 MCG tablet Take 50 mcg by mouth daily.    . memantine (NAMENDA) 5 MG tablet Take 5 mg by mouth 2 (two) times daily.    . pramipexole (MIRAPEX) 0.5 MG tablet Take 0.5 mg by mouth at bedtime.    Marland Kitchen Propylene Glycol (SYSTANE BALANCE) 0.6 % SOLN Place 1 drop into both eyes daily.    . rosuvastatin (CRESTOR) 10 MG tablet Take 10 mg by mouth daily.     . sertraline (ZOLOFT) 100 MG tablet Take 50 mg by mouth 2 (two) times daily.    Marland Kitchen tiZANidine (ZANAFLEX) 4 MG tablet Take 4 mg by mouth  as needed for muscle spasms.     . traMADol-acetaminophen (ULTRACET) 37.5-325 MG tablet Take 1 tablet by mouth as needed for moderate pain.     . traZODone (DESYREL) 50 MG tablet Take 50-100 mg by mouth at bedtime as needed for sleep.     . calcium carbonate (TUMS) 500 MG chewable tablet Chew 2 tablets (400 mg of elemental calcium total) by mouth 2 (two) times daily. (Patient not taking: Reported on 02/03/2019) 90 tablet 1  . docusate sodium (COLACE) 100 MG capsule Take 1 capsule (100 mg total) by mouth 2 (two) times daily as needed (take to keep stool soft.). (Patient not taking: Reported on 02/03/2019) 60 capsule 0  . traMADol (ULTRAM) 50 MG tablet Take 1-2 tablets (50-100 mg total) by mouth every 6 (six) hours as needed for moderate pain (mild pain not responsive to acetaminophen). (Patient not taking: Reported on 02/03/2019) 15 tablet 0    Drug Regimen Review Drug regimen was reviewed and remains appropriate with no significant issues identified  Home: Home Living Family/patient expects to be discharged to:: Private residence Living Arrangements: Spouse/significant other Available  Help at Discharge: Family, Available 24 hours/day Type of Home: Apartment Home Access: Level entry Home Layout: One level Bathroom Shower/Tub: Multimedia programmer: Standard Home Equipment: Wheelchair - manual, Environmental consultant - 2 wheels, [place] - single point, Grab bars - tub/shower Additional Comments: daughter in law present and will be able to help at home. She thinks they have a 3n1 at home but uncertain   Functional History: Prior Function Level of Independence: Needs assistance Gait / Transfers Assistance Needed: was walking with assist grossly 20' then unable to walk for 2 weeks PTA ADL's / Homemaking Assistance Needed: bathing and dressing on her own until 2 weeks PtA and since that time assist for all  Functional Status:  Mobility: Bed Mobility Overal bed mobility: Needs Assistance Bed Mobility: Rolling, Sidelying to Sit Rolling: Min assist Sidelying to sit: Mod assist General bed mobility comments: pt has improved over last treatment, but needs lots of tactile cuing and truncal assist slow enough for pt to engage R UE assist Transfers Overall transfer level: Needs assistance Equipment used: Rolling walker (2 wheeled) Transfers: Sit to/from Stand Sit to Stand: Min assist Stand pivot transfers: Min assist General transfer comment: repetitive/consistent cues for technique, direction and hand placement.  Assist to stay forward and minimal boost. Ambulation/Gait Ambulation/Gait assistance: Mod assist Gait Distance (Feet): 15 Feet(to bathroom, then 35 feet into hall and back to the chair.) Assistive device: Rolling walker (2 wheeled) Gait Pattern/deviations: Step-to pattern, Step-through pattern General Gait Details: short guarded steps, too far outside the RW. Consistently veering to the Left, needing assist to direct to the right.  Stiff and guarded overall. Gait velocity: slow Gait velocity interpretation: <1.31 ft/sec, indicative of household ambulator     ADL: ADL Overall ADL's : Needs assistance/impaired Grooming: Wash/dry hands, Moderate assistance, Standing Grooming Details (indicate cue type and reason): cues to avoid leaning over at sink level onto elbow Upper Body Bathing: Minimal assistance Lower Body Bathing: Moderate assistance, Sit to/from stand Upper Body Dressing : Sitting, Maximal assistance Upper Body Dressing Details (indicate cue type and reason): assistance to don brace Toilet Transfer: Minimal assistance, Cueing for safety, Cueing for sequencing, Ambulation, RW Toilet Transfer Details (indicate cue type and reason): simulated from eob taking approx. 10 steps to recliner (very small short steps), cues for rw management, asks "did we make it tell me when i can sit" Toileting-  Clothing Manipulation and Hygiene: Moderate assistance, Sit to/from stand Toileting - Clothing Manipulation Details (indicate cue type and reason): pt able to power up and attmepting to wipe with flexed posture with cues that squating is required Functional mobility during ADLs: Minimal assistance, Rolling walker General ADL Comments: able to complete bed mobility, and simulated toilet transfer in room with eob and ambulation to recliner.  Cognition: Cognition Overall Cognitive Status: Impaired/Different from baseline(more interactive and focus, but not tested formally) Orientation Level: Oriented to person, Oriented to time, Disoriented to place, Disoriented to situation Cognition Arousal/Alertness: Awake/alert Behavior During Therapy: WFL for tasks assessed/performed Overall Cognitive Status: Impaired/Different from baseline(more interactive and focus, but not tested formally) Area of Impairment: Orientation, Attention, Memory, Safety/judgement, Following commands, Awareness, Problem solving Orientation Level: Situation Current Attention Level: Sustained Memory: Decreased recall of precautions Following Commands: Follows one step commands with  increased time Safety/Judgement: Decreased awareness of deficits Awareness: Emergent Problem Solving: Slow processing, Decreased initiation, Difficulty sequencing General Comments: max directional verbal and tactile cues to complete task and for safety  Physical Exam: Blood pressure 136/68, pulse (!) 51, temperature 97.7 F (36.5 C), temperature source Oral, resp. rate 13, height 5\' 6"  (1.676 m), weight 65 kg, SpO2 96 %. Physical Exam  Constitutional: No distress.  HENT:  Right Ear: External ear normal.  Left Ear: External ear normal.  Eyes: EOM are normal.  Neck: No tracheal deviation present. No thyromegaly present.  Cardiovascular: Regular rhythm.  bradycardic  Respiratory: Effort normal. No respiratory distress. She has no wheezes.  GI: Soft. She exhibits no distension. There is no abdominal tenderness.  Musculoskeletal:        General: No edema. Normal range of motion.     Comments: Back brace in place. Low back tender with trunk and lower ext movement.   Neurological: She is alert.  Oriented to self, "hospital". Could not provide date, place without cueing. Knew she was in hospital after brain surgery. No focal CN abnl. Right limb ataxia. UE grossly 4/5 prox to distal. LE 3- prox to 4/5 distally d/t back pain.  No focal sensory deficits  Skin: She is not diaphoretic.  Cervical-->occipital staples in place. Incision CDI. Bruising along right lateral aspect and behind ear.   Psychiatric: She has a normal mood and affect. Her behavior is normal.    No results found for this or any previous visit (from the past 48 hour(s)). No results found.     Medical Problem List and Plan: 1.  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  Patient with slow Decadron taper decreased to 2 mg twice daily x48  hours and stop  -patient may  shower  -ELOS/Goals: 17-20 days, min assist goals PT, OT, supervision SLP 2.  Antithrombotics: -DVT/anticoagulation: Subcutaneous heparin.  Check vascular study  -antiplatelet therapy: N/A 3. Pain Management: Hydrocodone as needed  -back brace 4.  Zoloft 100 mg daily,: Wellbutrin 200 mg daily, BuSpar 15mg  twice daily  -antipsychotic agents: N/A 5. Neuropsych: This patient is not capable of making decisions on her own behalf. 6. Skin/Wound Care: Routine skin checks  -probably can remove staples from cervical-occipital incision 7. Fluids/Electrolytes/Nutrition: Routine in and outs with follow-up chemistries 8.  Hypertension.  Avapro 300 mg daily.  Monitor with increased mobility 9.  Hypothyroidism.  Synthroid 10.  Hyperlipidemia.  Crestor 11.  Restless leg  syndrome.  Mirapex 0.5 mg nightly 12.  History of renal cancer with left radical laparoscopic nephrectomy 05/21/2014.  As well as history of endometrial adenocarcinoma 2017.  Follow-up outpatient 13.  Constipation.  Laxative assistance    Cathlyn Parsons, PA-C 02/12/2019

## 2019-02-12 NOTE — Progress Notes (Signed)
Physical Therapy Treatment Patient Details Name: Krista Hodges MRN: [id number] DOB: 12-12-1955 Today's Date: 02/12/2019    History of Present Illness 63 yo admitted with falls and weakness found to have T12 and L3 fx to be treated with TLSO for OOB and cerebellar mass s/p craniotomy with MRI also demonstrating multiple breast, lung, adrenal and possible liver mass. PMHx: HTN, depression, HLD and multiple cancers including ?RCC s/p nephrectomy around 2015, endometrial adenocarcinoma 2017, papillary thyroid carcinoma 2019 s/p radiation. Pt also recently diagnosed with dementia.    PT Comments    Today, conversant and more focused.  Worked on balance at EOB while assisting fitment and donning of the new TLSO.  Worked on pt initiating and following through with VC's and working on standing balance with less support.  In lieu of longer distance gait, pt worked on improved gait quality in the RW.    Follow Up Recommendations  CIR;Supervision/Assistance - 24 hour(pt has not progressed as expected, course has been Copy  3in1 (PT)(TBA)    Recommendations for Other Services       Precautions / Restrictions Precautions Precautions: Fall;Back Precaution Booklet Issued: Yes (comment) Required Braces or Orthoses: Spinal Brace Spinal Brace: Thoracolumbosacral orthotic;Applied in sitting position    Mobility  Bed Mobility Overal bed mobility: Needs Assistance Bed Mobility: Rolling;Sidelying to Sit Rolling: Min assist Sidelying to sit: Mod assist       General bed mobility comments: practiced building momentum today.  pt has improved over last treatment, but needs lots of tactile cuing and truncal assist slow enough for pt to engage R UE assist  Transfers Overall transfer level: Needs assistance Equipment used: Rolling walker (2 wheeled) Transfers: Sit to/from Stand Sit to Stand: Min assist         General transfer comment: repetitive/consistent  cues for technique, direction and hand placement.  Assist to stay forward and minimal boost.  Ambulation/Gait Ambulation/Gait assistance: Min assist Gait Distance (Feet): 30 Feet Assistive device: Rolling walker (2 wheeled) Gait Pattern/deviations: Step-to pattern;Step-through pattern Gait velocity: slow   General Gait Details: short steps, variable in length.  mildly retropulsive, still stayin too far behind the RW.  Having said this, there are improvements overall and pt able to assist maneuvering the RW today.   Stairs             Wheelchair Mobility    Modified Rankin (Stroke Patients Only)       Balance Overall balance assessment: Needs assistance Sitting-balance support: Single extremity supported;No upper extremity supported;Bilateral upper extremity supported Sitting balance-Leahy Scale: Fair       Standing balance-Leahy Scale: Poor Standing balance comment: required AD and external support                            Cognition Arousal/Alertness: Awake/alert Behavior During Therapy: WFL for tasks assessed/performed Overall Cognitive Status: Impaired/Different from baseline(more interactive and focus, but not tested formally)                     Current Attention Level: Selective Memory: Decreased recall of precautions Following Commands: Follows one step commands with increased time Safety/Judgement: Decreased awareness of deficits Awareness: Emergent Problem Solving: Slow processing;Decreased initiation;Difficulty sequencing        Exercises      General Comments        Pertinent Vitals/Pain Pain Assessment: Faces Faces Pain Scale: Hurts little more Pain Location: back  Pain Descriptors / Indicators: Aching;Discomfort Pain Intervention(s): Monitored during session    Home Living                      Prior Function            PT Goals (current goals can now be found in the care plan section) Acute Rehab PT  Goals Patient Stated Goal: home PT Goal Formulation: With patient/family Time For Goal Achievement: 02/16/19 Potential to Achieve Goals: Good Progress towards PT goals: Progressing toward goals(slow progression)    Frequency    Min 4X/week      PT Plan Current plan remains appropriate    Co-evaluation              AM-PAC PT "6 Clicks" Mobility   Outcome Measure  Help needed turning from your back to your side while in a flat bed without using bedrails?: A Lot Help needed moving from lying on your back to sitting on the side of a flat bed without using bedrails?: A Lot Help needed moving to and from a bed to a chair (including a wheelchair)?: A Lot Help needed standing up from a chair using your arms (e.g., wheelchair or bedside chair)?: A Lot Help needed to walk in hospital room?: A Little Help needed climbing 3-5 steps with a railing? : A Lot 6 Click Score: 13    End of Session Equipment Utilized During Treatment: Back brace Activity Tolerance: Patient tolerated treatment well;Other (comment) Patient left: in bed;with call bell/phone within reach;with bed alarm set;with family/visitor present Nurse Communication: Mobility status;Precautions PT Visit Diagnosis: Other abnormalities of gait and mobility (R26.89);Pain;Other symptoms and signs involving the nervous system (R29.898) Pain - Right/Left: (back) Pain - part of body: (back)     Time: [id number] PT Time Calculation (min) (ACUTE ONLY): 24 min  Charges:  $Gait Training: 8-22 mins $Therapeutic Activity: 8-22 mins                     02/12/2019  Krista Hodges., PT Acute Rehabilitation Services [phone]  (pager) [phone]  (office)   Krista Hodges 02/12/2019, 12:35 PM

## 2019-02-13 NOTE — Progress Notes (Signed)
Physical Therapy Treatment Patient Details Name: Krista Hodges MRN: [id number] DOB: [birth year] Today's Date: 02/13/2019    History of Present Illness 64 yo admitted with falls and weakness found to have T12 and L3 fx to be treated with TLSO for OOB and cerebellar mass s/p craniotomy with MRI also demonstrating multiple breast, lung, adrenal and possible liver mass. PMHx: HTN, depression, HLD and multiple cancers including ?RCC s/p nephrectomy around 2015, endometrial adenocarcinoma 2017, papillary thyroid carcinoma 2019 s/p radiation. Pt also recently diagnosed with dementia.    PT Comments    Pt reporting mild nausea upon PT arrival, but agreeable to mobility with PT. Pt tolerated sit to stand x3 today, with good participation and completion of dynamic standing balance tasks. Pt still requires verbal cuing for maintaining back precautions and safety, but is very attentive and engaged in session today. Upon return to supine, pt reports feeling acutely nauseous and had ~200 cc vomit, RN notified and in room upon PT exit. PT continuing to recommend CIR, will continue to follow acutely.    Follow Up Recommendations  CIR;Supervision/Assistance - 24 hour(pt has not progressed as expected, course has been Copy  3in1 (PT)(TBA)    Recommendations for Other Services       Precautions / Restrictions Precautions Precautions: Fall;Back Precaution Booklet Issued: Yes (comment) Required Braces or Orthoses: Spinal Brace Spinal Brace: Thoracolumbosacral orthotic;Applied in sitting position Restrictions Weight Bearing Restrictions: No    Mobility  Bed Mobility Overal bed mobility: Needs Assistance Bed Mobility: Rolling;Sidelying to Sit;Sit to Sidelying Rolling: Min assist Sidelying to sit: Mod assist     Sit to sidelying: Mod assist;HOB elevated General bed mobility comments: min assist for rolling for technique and completion of roll onto R side. mod assist  for sidelying<>sit for trunk and LE management, scooting to EOB. Verbal reinforcement of importance of log roll technique.  Transfers Overall transfer level: Needs assistance Equipment used: Rolling walker (2 wheeled) Transfers: Sit to/from [facility] Sit to Stand: From elevated surface;Mod assist Stand pivot transfers: Mod assist;From elevated surface       General transfer comment: Mod assist for power up, steadying, correction of posterior leaning. Sit to stand x3 during session from EOB x2 and from [facility] x1. SPT with mod assist for steadying and guiding to [facility]. Verbal cuing for hand placement when rising from both bed and BSC.  Ambulation/Gait             General Gait Details: unable   Stairs             Wheelchair Mobility    Modified [place] (Stroke Patients Only)       Balance Overall balance assessment: Needs assistance Sitting-balance support: No upper extremity supported;Bilateral upper extremity supported Sitting balance-Leahy Scale: Fair       Standing balance-Leahy Scale: Poor Standing balance comment: required AD and external support                            Cognition Arousal/Alertness: Awake/alert Behavior During Therapy: WFL for tasks assessed/performed Overall Cognitive Status: Impaired/Different from baseline(more interactive and focus, but not tested formally) Area of Impairment: Attention;Memory;Following commands;Safety/judgement;Problem solving                   Current Attention Level: Selective Memory: Decreased recall of precautions Following Commands: Follows one step commands with increased time;Follows one step commands consistently Safety/Judgement: Decreased awareness of deficits  Problem Solving: Slow processing;Decreased initiation;Difficulty sequencing;Requires tactile cues;Requires verbal cues        Exercises General Exercises - Lower Extremity Hip Flexion/Marching: AROM;Both;5  reps;Standing(with 3 second hold)    General Comments        Pertinent Vitals/Pain Pain Assessment: No/denies pain Pain Score: 0-No pain Pain Intervention(s): Monitored during session    Home Living                      Prior Function            PT Goals (current goals can now be found in the care plan section) Acute Rehab PT Goals Patient Stated Goal: home PT Goal Formulation: With patient/family Time For Goal Achievement: 02/16/19 Potential to Achieve Goals: Good Progress towards PT goals: Progressing toward goals    Frequency    Min 4X/week      PT Plan Current plan remains appropriate    Co-evaluation              AM-PAC PT "6 Clicks" Mobility   Outcome Measure  Help needed turning from your back to your side while in a flat bed without using bedrails?: A Lot Help needed moving from lying on your back to sitting on the side of a flat bed without using bedrails?: A Lot Help needed moving to and from a bed to a chair (including a wheelchair)?: A Lot Help needed standing up from a chair using your arms (e.g., wheelchair or bedside chair)?: A Lot Help needed to walk in hospital room?: Total Help needed climbing 3-5 steps with a railing? : Total 6 Click Score: 10    End of Session Equipment Utilized During Treatment: Back brace Activity Tolerance: Other (comment);Patient limited by fatigue(nausea and vomiting) Patient left: in bed;with call bell/phone within reach;with bed alarm set;with family/visitor present Nurse Communication: Mobility status PT Visit Diagnosis: Other abnormalities of gait and mobility (R26.89);Pain;Other symptoms and signs involving the nervous system (R29.898) Pain - Right/Left: (back) Pain - part of body: (back)     Time: [id number] PT Time Calculation (min) (ACUTE ONLY): 24 min  Charges:  $Therapeutic Activity: 23-37 mins                     Krizia Flight E, PT Acute Rehabilitation Services Pager [phone]  Office  [phone]   Latha Staunton D Avery Klingbeil 02/13/2019, 3:14 PM

## 2019-02-13 NOTE — Progress Notes (Signed)
BP (!) 143/73 (BP Location: Right Arm)   Pulse 60   Temp 98.3 F (36.8 C) (Oral)   Resp 14   Ht 5\' 6"  (1.676 m)   Wt 65 kg   SpO2 94%   BMI 23.13 kg/m  Felt nauseated with PT Following commands Moving all extremities Wound is clean, and dry Placement is pending

## 2019-02-14 NOTE — Progress Notes (Signed)
Subjective: Patient reports some headaches at time but overall doing well  Objective: Vital signs in last 24 hours: Temp:  [97.5 F (36.4 C)-98.9 F (37.2 C)] 97.5 F (36.4 C) (01/02 0730) Pulse Rate:  [56-61] 61 (01/02 0730) Resp:  [11-17] 12 (01/02 0730) BP: (119-147)/(72-79) 133/72 (01/02 0730) SpO2:  [94 %-98 %] 98 % (01/02 0328)  Intake/Output from previous day: 01/01 0701 - 01/02 0700 In: -  Out: 3225 [Urine:3225] Intake/Output this shift: Total I/O In: -  Out: 1100 [Urine:1100]  Neurologic: Grossly normal  Lab Results: Lab Results  Component Value Date   WBC 13.6 (H) 02/06/2019   HGB 9.2 (L) 02/06/2019   HCT 28.4 (L) 02/06/2019   MCV 95.9 02/06/2019   PLT 259 02/06/2019   Lab Results  Component Value Date   INR 1.0 02/03/2019   BMET Lab Results  Component Value Date   NA 138 02/06/2019   K 3.4 (L) 02/06/2019   CL 107 02/06/2019   CO2 20 (L) 02/06/2019   GLUCOSE 135 (H) 02/06/2019   BUN 16 02/06/2019   CREATININE 0.81 02/06/2019   CALCIUM 9.3 02/06/2019    Studies/Results: No results found.  Assessment/Plan: Status post suboccipital crani for tumor. Awaiting placement. Continue therapies   LOS: 11 days    Ocie Cornfield [facility] 02/14/2019, 10:13 AM

## 2019-02-15 NOTE — Progress Notes (Signed)
NEUROSURGERY PROGRESS NOTE  Patient very tearful this morning and on the phone with her husband. States "I just want to go home." Denies any headaches  Temp:  [97.7 F (36.5 C)-98.2 F (36.8 C)] 98.2 F (36.8 C) (01/03 0735) Pulse Rate:  [56-57] 57 (01/03 0735) Resp:  [11-18] 18 (01/03 0735) BP: (123-146)/(70-95) 146/71 (01/03 0735) SpO2:  [96 %-100 %] 96 % (01/03 0735)  Plan: Awaiting placement. Had a lengthy discussion with her on the importance of getting more intense rehab before going home. She verbalized that she understood. Continue therapy today  Eleonore Chiquito, NP 02/15/2019 8:37 AM

## 2019-02-16 ENCOUNTER — Other Ambulatory Visit: Payer: Self-pay

## 2019-02-16 ENCOUNTER — Inpatient Hospital Stay (HOSPITAL_COMMUNITY)
Admission: RE | Admit: 2019-02-16 | Discharge: 2019-02-27 | DRG: 092 | Disposition: A | Discharge status: Hospice | Payer: Medicare Other | Source: Intra-hospital | Attending: Physical Medicine & Rehabilitation | Admitting: Physical Medicine & Rehabilitation

## 2019-02-16 ENCOUNTER — Encounter (HOSPITAL_COMMUNITY): Payer: Self-pay | Admitting: Physical Medicine & Rehabilitation

## 2019-02-16 DIAGNOSIS — C7931 Secondary malignant neoplasm of brain: Secondary | ICD-10-CM | POA: Diagnosis present

## 2019-02-16 DIAGNOSIS — I10 Essential (primary) hypertension: Secondary | ICD-10-CM | POA: Diagnosis present

## 2019-02-16 DIAGNOSIS — K5901 Slow transit constipation: Secondary | ICD-10-CM

## 2019-02-16 DIAGNOSIS — Z79899 Other long term (current) drug therapy: Secondary | ICD-10-CM | POA: Diagnosis not present

## 2019-02-16 DIAGNOSIS — G911 Obstructive hydrocephalus: Secondary | ICD-10-CM | POA: Diagnosis not present

## 2019-02-16 DIAGNOSIS — Z515 Encounter for palliative care: Secondary | ICD-10-CM | POA: Diagnosis not present

## 2019-02-16 DIAGNOSIS — Z85528 Personal history of other malignant neoplasm of kidney: Secondary | ICD-10-CM

## 2019-02-16 DIAGNOSIS — M4856XD Collapsed vertebra, not elsewhere classified, lumbar region, subsequent encounter for fracture with routine healing: Secondary | ICD-10-CM | POA: Diagnosis present

## 2019-02-16 DIAGNOSIS — F0390 Unspecified dementia without behavioral disturbance: Secondary | ICD-10-CM | POA: Diagnosis present

## 2019-02-16 DIAGNOSIS — F329 Major depressive disorder, single episode, unspecified: Secondary | ICD-10-CM

## 2019-02-16 DIAGNOSIS — D496 Neoplasm of unspecified behavior of brain: Secondary | ICD-10-CM | POA: Diagnosis present

## 2019-02-16 DIAGNOSIS — Z8249 Family history of ischemic heart disease and other diseases of the circulatory system: Secondary | ICD-10-CM

## 2019-02-16 DIAGNOSIS — Z87891 Personal history of nicotine dependence: Secondary | ICD-10-CM | POA: Diagnosis not present

## 2019-02-16 DIAGNOSIS — R2689 Other abnormalities of gait and mobility: Principal | ICD-10-CM | POA: Diagnosis present

## 2019-02-16 DIAGNOSIS — Z809 Family history of malignant neoplasm, unspecified: Secondary | ICD-10-CM

## 2019-02-16 DIAGNOSIS — G2581 Restless legs syndrome: Secondary | ICD-10-CM | POA: Diagnosis present

## 2019-02-16 DIAGNOSIS — Z85850 Personal history of malignant neoplasm of thyroid: Secondary | ICD-10-CM

## 2019-02-16 DIAGNOSIS — F411 Generalized anxiety disorder: Secondary | ICD-10-CM | POA: Diagnosis present

## 2019-02-16 DIAGNOSIS — M4854XD Collapsed vertebra, not elsewhere classified, thoracic region, subsequent encounter for fracture with routine healing: Secondary | ICD-10-CM | POA: Diagnosis present

## 2019-02-16 DIAGNOSIS — Z8672 Personal history of thrombophlebitis: Secondary | ICD-10-CM

## 2019-02-16 DIAGNOSIS — Z87898 Personal history of other specified conditions: Secondary | ICD-10-CM | POA: Diagnosis not present

## 2019-02-16 DIAGNOSIS — M7989 Other specified soft tissue disorders: Secondary | ICD-10-CM | POA: Diagnosis not present

## 2019-02-16 DIAGNOSIS — Z8542 Personal history of malignant neoplasm of other parts of uterus: Secondary | ICD-10-CM

## 2019-02-16 DIAGNOSIS — K5900 Constipation, unspecified: Secondary | ICD-10-CM | POA: Diagnosis present

## 2019-02-16 DIAGNOSIS — Z905 Acquired absence of kidney: Secondary | ICD-10-CM | POA: Diagnosis not present

## 2019-02-16 DIAGNOSIS — R5382 Chronic fatigue, unspecified: Secondary | ICD-10-CM | POA: Diagnosis present

## 2019-02-16 DIAGNOSIS — E039 Hypothyroidism, unspecified: Secondary | ICD-10-CM | POA: Diagnosis present

## 2019-02-16 DIAGNOSIS — E785 Hyperlipidemia, unspecified: Secondary | ICD-10-CM | POA: Diagnosis present

## 2019-02-16 DIAGNOSIS — Z66 Do not resuscitate: Secondary | ICD-10-CM

## 2019-02-16 LAB — CBC
HCT: 32.7 % — ABNORMAL LOW (ref 36.0–46.0)
Hemoglobin: 10.5 g/dL — ABNORMAL LOW (ref 12.0–15.0)
MCH: 31.3 pg (ref 26.0–34.0)
MCHC: 32.1 g/dL (ref 30.0–36.0)
MCV: 97.3 fL (ref 80.0–100.0)
Platelets: 229 10*3/uL (ref 150–400)
RBC: 3.36 MIL/uL — ABNORMAL LOW (ref 3.87–5.11)
RDW: 14.5 % (ref 11.5–15.5)
WBC: 14.3 10*3/uL — ABNORMAL HIGH (ref 4.0–10.5)
nRBC: 0 % (ref 0.0–0.2)

## 2019-02-16 LAB — CREATININE, SERUM
Creatinine, Ser: 0.94 mg/dL (ref 0.44–1.00)
GFR calc Af Amer: >60 mL/min (ref >60)
GFR calc non Af Amer: >60 mL/min (ref >60)

## 2019-02-16 MED ORDER — PRAMIPEXOLE DIHYDROCHLORIDE 0.25 MG PO TABS
0.5000 mg | ORAL_TABLET | Freq: Every day | ORAL | Status: DC
  Administered 2019-02-16 – 2019-02-26 (×11): 0.5 mg via ORAL
  Filled 2019-02-16 (×12): qty 2

## 2019-02-16 MED ORDER — IRBESARTAN 300 MG PO TABS
300.0000 mg | ORAL_TABLET | Freq: Every day | ORAL | Status: DC
  Administered 2019-02-17 – 2019-02-27 (×11): 300 mg via ORAL
  Filled 2019-02-16 (×11): qty 1

## 2019-02-16 MED ORDER — PANTOPRAZOLE SODIUM 40 MG PO TBEC
40.0000 mg | DELAYED_RELEASE_TABLET | Freq: Every day | ORAL | Status: DC
  Administered 2019-02-16 – 2019-02-26 (×11): 40 mg via ORAL
  Filled 2019-02-16 (×11): qty 1

## 2019-02-16 MED ORDER — TRAZODONE HCL 50 MG PO TABS
100.0000 mg | ORAL_TABLET | Freq: Every evening | ORAL | Status: DC | PRN
  Administered 2019-02-19 – 2019-02-21 (×2): 100 mg via ORAL
  Filled 2019-02-16 (×2): qty 2

## 2019-02-16 MED ORDER — ONDANSETRON HCL 4 MG/2ML IJ SOLN: 4.0000 mg | INTRAMUSCULAR | Status: DC | PRN

## 2019-02-16 MED ORDER — ROSUVASTATIN CALCIUM 5 MG PO TABS
10.0000 mg | ORAL_TABLET | Freq: Every day | ORAL | Status: DC
  Administered 2019-02-16 – 2019-02-26 (×11): 10 mg via ORAL
  Filled 2019-02-16 (×11): qty 2

## 2019-02-16 MED ORDER — SERTRALINE HCL 100 MG PO TABS
100.0000 mg | ORAL_TABLET | Freq: Every day | ORAL | Status: DC
  Administered 2019-02-17 – 2019-02-27 (×11): 100 mg via ORAL
  Filled 2019-02-16 (×11): qty 1

## 2019-02-16 MED ORDER — POLYETHYLENE GLYCOL 3350 17 G PO PACK
17.0000 g | PACK | Freq: Every day | ORAL | Status: DC | PRN
  Administered 2019-02-19: 17 g via ORAL
  Filled 2019-02-16 (×2): qty 1

## 2019-02-16 MED ORDER — DEXAMETHASONE 4 MG PO TABS: 2.0000 mg | ORAL_TABLET | Freq: Two times a day (BID) | ORAL | Status: DC

## 2019-02-16 MED ORDER — SENNA 8.6 MG PO TABS
1.0000 | ORAL_TABLET | Freq: Two times a day (BID) | ORAL | Status: DC
  Administered 2019-02-16 – 2019-02-27 (×21): 8.6 mg via ORAL
  Filled 2019-02-16 (×22): qty 1

## 2019-02-16 MED ORDER — BISACODYL 10 MG RE SUPP
10.0000 mg | Freq: Every day | RECTAL | Status: DC | PRN
  Administered 2019-02-20: 10 mg via RECTAL
  Filled 2019-02-16: qty 1

## 2019-02-16 MED ORDER — LEVOTHYROXINE SODIUM 75 MCG PO TABS
75.0000 ug | ORAL_TABLET | Freq: Every day | ORAL | Status: DC
  Administered 2019-02-17 – 2019-02-27 (×11): 75 ug via ORAL
  Filled 2019-02-16 (×11): qty 1

## 2019-02-16 MED ORDER — ACETAMINOPHEN 325 MG PO TABS
650.0000 mg | ORAL_TABLET | ORAL | Status: DC | PRN
  Administered 2019-02-17 – 2019-02-27 (×10): 650 mg via ORAL
  Filled 2019-02-16 (×8): qty 2

## 2019-02-16 MED ORDER — PREDNISONE 1 MG PO TABS
2.0000 mg | ORAL_TABLET | Freq: Two times a day (BID) | ORAL | Status: AC
  Administered 2019-02-16 – 2019-02-18 (×4): 2 mg via ORAL
  Filled 2019-02-16 (×4): qty 2

## 2019-02-16 MED ORDER — BUSPIRONE HCL 5 MG PO TABS
15.0000 mg | ORAL_TABLET | Freq: Two times a day (BID) | ORAL | Status: DC
  Administered 2019-02-16 – 2019-02-27 (×22): 15 mg via ORAL
  Filled 2019-02-16 (×23): qty 3

## 2019-02-16 MED ORDER — ONDANSETRON HCL 4 MG PO TABS
4.0000 mg | ORAL_TABLET | ORAL | Status: DC | PRN
  Administered 2019-02-18 – 2019-02-26 (×4): 4 mg via ORAL
  Filled 2019-02-16 (×4): qty 1

## 2019-02-16 MED ORDER — HYDROCODONE-ACETAMINOPHEN 5-325 MG PO TABS
1.0000 | ORAL_TABLET | ORAL | Status: DC | PRN
  Administered 2019-02-16 – 2019-02-27 (×21): 1 via ORAL
  Filled 2019-02-16 (×23): qty 1

## 2019-02-16 MED ORDER — BUPROPION HCL 100 MG PO TABS
200.0000 mg | ORAL_TABLET | Freq: Every day | ORAL | Status: DC
  Administered 2019-02-17 – 2019-02-27 (×11): 200 mg via ORAL
  Filled 2019-02-16 (×11): qty 2

## 2019-02-16 MED ORDER — HEPARIN SODIUM (PORCINE) 5000 UNIT/ML IJ SOLN
5000.0000 [IU] | Freq: Three times a day (TID) | INTRAMUSCULAR | Status: DC
  Administered 2019-02-16 – 2019-02-18 (×5): 5000 [IU] via SUBCUTANEOUS
  Filled 2019-02-16 (×5): qty 1

## 2019-02-16 MED ORDER — DOCUSATE SODIUM 100 MG PO CAPS
100.0000 mg | ORAL_CAPSULE | Freq: Two times a day (BID) | ORAL | Status: DC
  Administered 2019-02-16 – 2019-02-17 (×3): 100 mg via ORAL
  Filled 2019-02-16 (×4): qty 1

## 2019-02-16 MED ORDER — HEPARIN SODIUM (PORCINE) 5000 UNIT/ML IJ SOLN: 5000.0000 [IU] | Freq: Three times a day (TID) | INTRAMUSCULAR | Status: DC

## 2019-02-16 MED ORDER — ACETAMINOPHEN 650 MG RE SUPP: 650.0000 mg | RECTAL | Status: DC | PRN

## 2019-02-16 NOTE — Progress Notes (Signed)
Physical Medicine and Rehabilitation Consult Reason for Consult: Decreased functional mobility Referring Physician: Dr. Kathyrn Sheriff     HPI: Krista Hodges is a 64 y.o. right-handed female with history of renal cancer with left radical laparoscopic nephrectomy 05/21/2014, endometrial adenocarcinoma 2017, hypothyroidism after XRT for papillary thyroid cancer, chronic fatigue and immune dysfunction syndrome, CKD stage III, remote tobacco abuse, early dementia maintained on Aricept as well as BuSpar.  History taken from chart review due to cognition and son taken from patient.  Patient states she lives with her spouse in a 1 level home.  They have a daughter-in-law who can assist as needed.  Patient ambulating with assistance prior to admission.  She presented on 02/03/2019 with gait instability.  Abdominal/cervical/chest CTs revealed multiple nodules diffusely suggestive of metastatic disease.  CT head personally reviewed, showing hemorrhagic mass with vasogenic edema.  She underwent suboccipital craniotomy resection of tumor on 02/04/2019 per Dr. Kathyrn Sheriff.  Placed on Keppra for seizure prophylaxis as well as Decadron protocol established.  Cardiology services consulted 02/06/2019 for tachy/brady and telemetry carefully reviewed no episodes of true tachycardia noted she did have some PVCs heart rate as low as 35 but not sustained.  No current indications for pacemaker and monitored.. Patient also found to have compression deformities at T12 and L3 as well as a lobulated mass lesion in the medial aspect of the right middle lobe with adjacent satellite nodules and mediastinal and right hilar adenopathy.  Patient was fitted with a TLSO back brace applied in sitting position.  Tolerating a regular diet.  Therapy evaluations completed with recommendations of physical medicine rehab consult.   Review of Systems  Constitutional: Negative for chills and fever.  HENT: Negative for hearing loss.   Eyes:  Negative for blurred vision and double vision.  Respiratory: Negative for cough and shortness of breath.   Cardiovascular: Negative for leg swelling.  Gastrointestinal: Positive for constipation. Negative for heartburn, nausea and vomiting.  Genitourinary: Negative for dysuria and hematuria.  Musculoskeletal: Positive for joint pain and myalgias.  Skin: Negative for rash.  Neurological: Positive for dizziness and tremors.       Gait instability Restless legs  Psychiatric/Behavioral: Positive for depression and memory loss.       Anxiety        Past Medical History:  Diagnosis Date  . Allergic rhinitis    . Anxiety state    . Arthritis, degenerative    . Atypical migraine    . Awareness of heartbeats    . Cancer of left kidney (HCC)      Left Kidney  . Cervical radiculitis    . CFIDS (chronic fatigue and immune dysfunction syndrome) ([place])    . Depression, major, single episode, in partial remission ([place])    . Depression, neurotic    . Elevated serum creatinine 03/2016    history of (2.07)  . Endometrial adenocarcinoma ([place])      Stage II grade2  . Essential (primary) hypertension    . Gastric ulcer    . Heart murmur      pt. denies  . HLD (hyperlipidemia)    . Inflammation of hand joint    . Memory difficulties    . Premature complex, ventricular    . Renal disorder    . Renal mass, left    . Restless leg    . Shortness of breath dyspnea      occasional  . Thrombophlebitis of superficial veins  of lower extremity    . Tremor      L arm  . Vitamin D deficiency           Past Surgical History:  Procedure Laterality Date  . c sections x 3       . CARPAL TUNNEL RELEASE        rt hand  . CRANIOTOMY N/A 02/04/2019    Procedure: Suboccipital CRANIECTOMY for  TUMOR EXCISION;  Surgeon: Consuella Lose, MD;  Location: [facility];  Service: Neurosurgery;  Laterality: N/A;  . LAPAROSCOPIC NEPHRECTOMY Left 05/21/2014    Procedure: LEFT RADICAL LAPAROSCOPIC NEPHRECTOMY;   Surgeon: Ardis Hughs, MD;  Location: [facility];  Service: Urology;  Laterality: Left;  Marland Kitchen MANDIBLE SURGERY      . NEPHRECTOMY TRANSPLANTED ORGAN        Left kidney removed  . ROBOTIC ASSISTED TOTAL HYSTERECTOMY WITH BILATERAL SALPINGO OOPHERECTOMY   12/27/2015  . THYROIDECTOMY N/A 05/09/2017    Procedure: TOTAL THYROIDECTOMY WITH LIMITED CENTRAL COMPARTMENT LYMPH NODE DISSECTION;  Surgeon: Armandina Gemma, MD;  Location: [facility];  Service: General;  Laterality: N/A;  . TOTAL THYROIDECTOMY        05-09-17 Dr. Harlow Asa         Family History  Problem Relation Age of Onset  . Cancer Mother    . Heart attack Father    . Diabetes Other    . Dementia Neg Hx      Social History:  reports that she quit smoking about 11 years ago. Her smoking use included cigarettes. She smoked 1.00 pack per day. She has never used smokeless tobacco. She reports current alcohol use. She reports that she does not use drugs. Allergies:       Allergies  Allergen Reactions  . Penicillins Hives      Whelts. Has tolerated cephazolin and cephalexin. Has patient had a PCN reaction causing immediate rash, facial/tongue/throat swelling, SOB or lightheadedness with hypotension: Yes (per pt report) Has patient had a PCN reaction causing severe rash involving mucus membranes or skin necrosis: Yes Has patient had a PCN reaction that required hospitalization: No Has patient had a PCN reaction occurring within the last 10 years: No If all of the above answers are "NO", then may proceed with Cephalosporin  . Aspirin        Cannot take due to nephrectomy  . Keppra [Levetiracetam] Nausea Only      Anxiety   . Nsaids        Cannot take due to nephrectomy  . Requip [Ropinirole Hcl]        Headaches, restlessness    Medications Prior to Admission  Medication Sig Dispense Refill  . acetaminophen (TYLENOL) 500 MG tablet Take 500-1,000 mg by mouth daily as needed for moderate pain or headache.      Marland Kitchen buPROPion (WELLBUTRIN) 100 MG  tablet Take 100 mg by mouth daily.       . busPIRone (BUSPAR) 15 MG tablet Take 15 mg by mouth 2 (two) times daily.      Marland Kitchen donepezil (ARICEPT) 10 MG tablet Take 10 mg by mouth at bedtime.       . hydrochlorothiazide (HYDRODIURIL) 25 MG tablet Take 25 mg by mouth daily.      . irbesartan (AVAPRO) 300 MG tablet Take 300 mg daily by mouth.       . levothyroxine (SYNTHROID) 50 MCG tablet Take 50 mcg by mouth daily.      . memantine (NAMENDA) 5 MG  tablet Take 5 mg by mouth 2 (two) times daily.      . pramipexole (MIRAPEX) 0.5 MG tablet Take 0.5 mg by mouth at bedtime.      Marland Kitchen Propylene Glycol (SYSTANE BALANCE) 0.6 % SOLN Place 1 drop into both eyes daily.      . rosuvastatin (CRESTOR) 10 MG tablet Take 10 mg by mouth daily.       . sertraline (ZOLOFT) 100 MG tablet Take 50 mg by mouth 2 (two) times daily.      Marland Kitchen tiZANidine (ZANAFLEX) 4 MG tablet Take 4 mg by mouth as needed for muscle spasms.       . traMADol-acetaminophen (ULTRACET) 37.5-325 MG tablet Take 1 tablet by mouth as needed for moderate pain.       . traZODone (DESYREL) 50 MG tablet Take 50-100 mg by mouth at bedtime as needed for sleep.       . calcium carbonate (TUMS) 500 MG chewable tablet Chew 2 tablets (400 mg of elemental calcium total) by mouth 2 (two) times daily. (Patient not taking: Reported on 02/03/2019) 90 tablet 1  . docusate sodium (COLACE) 100 MG capsule Take 1 capsule (100 mg total) by mouth 2 (two) times daily as needed (take to keep stool soft.). (Patient not taking: Reported on 02/03/2019) 60 capsule 0  . traMADol (ULTRAM) 50 MG tablet Take 1-2 tablets (50-100 mg total) by mouth every 6 (six) hours as needed for moderate pain (mild pain not responsive to acetaminophen). (Patient not taking: Reported on 02/03/2019) 15 tablet 0      Home: [place] expects to be discharged to:: Private residence Living Arrangements: Spouse/significant other Available Help at Discharge: Family, Available 24  hours/day Type of Home: Apartment Home Access: Level entry Home Layout: One level Bathroom Shower/Tub: Multimedia programmer: Standard Home Equipment: Wheelchair - manual, Environmental consultant - 2 wheels, [place] - single point, Grab bars - tub/shower Additional Comments: daughter in law present and will be able to help at home. She thinks they have a 3n1 at home but uncertain  Functional History: Prior Function Level of Independence: Needs assistance Gait / Transfers Assistance Needed: was walking with assist grossly 20' then unable to walk for 2 weeks PTA ADL's / Homemaking Assistance Needed: bathing and dressing on her own until 2 weeks PtA and since that time assist for all Functional Status:  Mobility: Bed Mobility Overal bed mobility: Needs Assistance Bed Mobility: Rolling, Sidelying to Sit Rolling: Min assist Sidelying to sit: Mod assist General bed mobility comments: pt has improved over last treatment, but needs lots of tactile cuing and truncal assist slow enough for pt to engage R UE assist Transfers Overall transfer level: Needs assistance Equipment used: Rolling walker (2 wheeled) Transfers: Sit to/from Stand Sit to Stand: Min assist Stand pivot transfers: Min assist General transfer comment: repetitive/consistent cues for technique, direction and hand placement.  Assist to stay forward and minimal boost. Ambulation/Gait Ambulation/Gait assistance: Mod assist Gait Distance (Feet): 15 Feet(to bathroom, then 35 feet into hall and back to the chair.) Assistive device: Rolling walker (2 wheeled) Gait Pattern/deviations: Step-to pattern, Step-through pattern General Gait Details: short guarded steps, too far outside the RW. Consistently veering to the Left, needing assist to direct to the right.  Stiff and guarded overall. Gait velocity: slow Gait velocity interpretation: <1.31 ft/sec, indicative of household ambulator   ADL: ADL Overall ADL's : Needs  assistance/impaired Grooming: Wash/dry hands, Moderate assistance, Standing Grooming Details (indicate cue type and reason): cues  to avoid leaning over at sink level onto elbow Upper Body Bathing: Minimal assistance Lower Body Bathing: Moderate assistance, Sit to/from stand Upper Body Dressing : Sitting, Maximal assistance Upper Body Dressing Details (indicate cue type and reason): assistance to don brace Toilet Transfer: Minimal assistance, Cueing for safety, Cueing for sequencing, Ambulation, RW Toilet Transfer Details (indicate cue type and reason): simulated from eob taking approx. 10 steps to recliner (very small short steps), cues for rw management, asks "did we make it tell me when i can sit" Toileting- Clothing Manipulation and Hygiene: Moderate assistance, Sit to/from stand Toileting - Clothing Manipulation Details (indicate cue type and reason): pt able to power up and attmepting to wipe with flexed posture with cues that squating is required Functional mobility during ADLs: Minimal assistance, Rolling walker General ADL Comments: able to complete bed mobility, and simulated toilet transfer in room with eob and ambulation to recliner.   Cognition: Cognition Overall Cognitive Status: Impaired/Different from baseline(more interactive and focus, but not tested formally) Orientation Level: Oriented to person, Disoriented to place, Disoriented to situation, Disoriented to time Cognition Arousal/Alertness: Awake/alert Behavior During Therapy: WFL for tasks assessed/performed Overall Cognitive Status: Impaired/Different from baseline(more interactive and focus, but not tested formally) Area of Impairment: Orientation, Attention, Memory, Safety/judgement, Following commands, Awareness, Problem solving Orientation Level: Situation Current Attention Level: Sustained Memory: Decreased recall of precautions Following Commands: Follows one step commands with increased time Safety/Judgement:  Decreased awareness of deficits Awareness: Emergent Problem Solving: Slow processing, Decreased initiation, Difficulty sequencing General Comments: max directional verbal and tactile cues to complete task and for safety   Blood pressure 136/68, pulse (!) 51, temperature 97.7 F (36.5 C), temperature source Oral, resp. rate 13, height 5\' 6"  (1.676 m), weight 65 kg, SpO2 96 %. Physical Exam  Vitals reviewed. Constitutional: She appears well-developed and well-nourished.  HENT:  Head: Normocephalic and atraumatic.  Eyes: EOM are normal. Right eye exhibits no discharge. Left eye exhibits no discharge.  Neck: No tracheal deviation present. No thyromegaly present.  Respiratory: Effort normal. No stridor. No respiratory distress.  GI: She exhibits no distension.  Musculoskeletal:     Comments: No edema or tenderness in extremities  Neurological: She is alert.  Patient is alert and cooperative with exam.   She was able to provide her name but delay in processing for her age however she was able to give appropriate date of birth.   Follows simple commands.   Very limited medical historian. Motor: Grossly 4/5 throughout  Skin: Skin is warm and dry.  Psychiatric: Her affect is blunt. Her speech is delayed. She is slowed.      Lab Results Last 24 Hours  No results found for this or any previous visit (from the past 24 hour(s)).   Imaging Results (Last 48 hours)  No results found.     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  Records reviewed and summated above.   1. Does the need for close, 24 hr/day medical supervision in concert with the patient's rehab needs make it unreasonable for this patient to be served in a less intensive setting? Yes  2. Co-Morbidities requiring supervision/potential complications: renal cancer with left radical laparoscopic nephrectomy 05/21/2014,  endometrial adenocarcinoma 2017, hypothyroidism after XRT for papillary thyroid cancer, chronic fatigue and immune dysfunction syndrome, CKD stage III (repeat BMP, avoid nephrotoxic meds), remote tobacco abuse, early dementia, hypokalemia (continue to monitor and replete as necessary), leukocytosis (likely steroid-induced,  repeat labs, cont to monitor for signs and symptoms of infection, further workup if indicated), bradycardia (monitor with increased mobility) 3. Due to safety, skin/wound care, disease management, medication administration and patient education, does the patient require 24 hr/day rehab nursing? Yes 4. Does the patient require coordinated care of a physician, rehab nurse, therapy disciplines of PT/OT to address physical and functional deficits in the context of the above medical diagnosis(es)? Yes Addressing deficits in the following areas: balance, endurance, locomotion, strength, transferring, bathing, dressing, toileting, cognition and psychosocial support 5. Can the patient actively participate in an intensive therapy program of at least 3 hrs of therapy per day at least 5 days per week? Potentially 6. The potential for patient to make measurable gains while on inpatient rehab is good and fair 7. Anticipated functional outcomes upon discharge from inpatient rehab are min assist  with PT, min assist with OT, n/a with SLP. 8. Estimated rehab length of stay to reach the above functional goals is: 17-20 days. 9. Anticipated discharge destination: TBD. 10. Overall Rehab/Functional Prognosis: fair and poor   RECOMMENDATIONS: This patient's condition is appropriate for continued rehabilitative care in the following setting: May consider CIR when patient medically stable and able to tolerate 3 hours of therapy per day.  Recommend Palliative care consult and Heme/Onc follow up with patient and family.  Patient has agreed to participate in recommended program. Potentially Note that  insurance prior authorization may be required for reimbursement for recommended care.   Comment: Rehab Admissions Coordinator to follow up.   I have personally performed a face to face diagnostic evaluation, including, but not limited to relevant history and physical exam findings, of this patient and developed relevant assessment and plan.  Additionally, I have reviewed and concur with the physician assistant's documentation above.    Delice Lesch, MD, ABPMR Lavon Paganini Angiulli, PA-C 02/12/2019

## 2019-02-16 NOTE — Progress Notes (Signed)
Inpatient Rehab Admissions Coordinator:   I have insurance authorization and approval from Dr. Zada Finders for pt to admit to CIR today.  Will let pt/family and CM know.   Shann Medal, PT, DPT Admissions Coordinator [phone] 02/16/19  11:52 AM

## 2019-02-16 NOTE — Progress Notes (Signed)
Neurosurgery Service Progress Note  Subjective: No acute events overnight, no new complaints   Objective: Vitals:   02/15/19 2018 02/16/19 0000 02/16/19 0336 02/16/19 0714  BP: 128/70 134/71 137/73 135/72  Pulse: 64 (!) 58 (!) 58 61  Resp: _0 Temp: 98.4 F (36.9 C) 98.1 F (36.7 C) 98 F (36.7 C) 98.3 F (36.8 C)  TempSrc: Oral Oral Oral Oral  SpO2: 97% 97% 100% 98%  Weight:      Height:       Temp (24hrs), Avg:98.1 F (36.7 C), Min:97.8 F (36.6 C), Max:98.4 F (36.9 C)  CBC Latest Ref Rng & Units 02/06/2019 02/05/2019 02/04/2019  WBC 4.0 - 10.5 K/uL 13.6(H) 15.3(H) 10.5  Hemoglobin 12.0 - 15.0 g/dL 9.2(L) 9.2(L) 9.2(L)  Hematocrit 36.0 - 46.0 % 28.4(L) 28.7(L) 28.6(L)  Platelets 150 - 400 K/uL 259 296 309   BMP Latest Ref Rng & Units 02/06/2019 02/05/2019 02/04/2019  Glucose 70 - 99 mg/dL 135(H) 115(H) -  BUN 8 - 23 mg/dL 16 16 -  Creatinine 0.44 - 1.00 mg/dL 0.81 0.87 -  Sodium 135 - 145 mmol/L 138 138 138  Potassium 3.5 - 5.1 mmol/L 3.4(L) 3.5 3.3(L)  Chloride 98 - 111 mmol/L 107 108 -  CO2 22 - 32 mmol/L 20(L) 21(L) -  Calcium 8.9 - 10.3 mg/dL 9.3 9.5 -    Intake/Output Summary (Last 24 hours) at 02/16/2019 0820 Last data filed at 02/16/2019 0733 Gross per 24 hour  Intake 360 ml  Output 1275 ml  Net -915 ml    Current Facility-Administered Medications:  .  0.9 %  sodium chloride infusion, , Intravenous, Continuous, Suzette Battiest, MD, Last Rate: 200 mL/hr at 02/04/19 1557, New Bag at 02/04/19 1557 .  acetaminophen (TYLENOL) tablet 650 mg, 650 mg, Oral, Q4H PRN, 650 mg at 02/12/19 0046 **OR** acetaminophen (TYLENOL) suppository 650 mg, 650 mg, Rectal, Q4H PRN, Costella, Vincent J, PA-C .  bisacodyl (DULCOLAX) suppository 10 mg, 10 mg, Rectal, Daily PRN, Costella, Vincent J, PA-C, 10 mg at 02/09/19 0956 .  buPROPion [facility]) tablet 200 mg, 200 mg, Oral, Daily, Costella, Vincent J, PA-C, 200 mg at 02/15/19 4356 .  busPIRone (BUSPAR) tablet 15  mg, 15 mg, Oral, BID, Costella, Vincent J, PA-C, 15 mg at 02/15/19 2135 .  dexamethasone (DECADRON) tablet 4 mg, 4 mg, Oral, BID, Judith Part, MD, 4 mg at 02/15/19 2136 .  docusate sodium (COLACE) capsule 100 mg, 100 mg, Oral, BID, Costella, Vincent J, PA-C, 100 mg at 02/15/19 2135 .  doxylamine (Sleep) (UNISOM) tablet 25 mg, 25 mg, Oral, QHS PRN, Costella, Vincent J, PA-C, 25 mg at 02/04/19 8616 .  heparin injection 5,000 Units, 5,000 Units, Subcutaneous, Q8H, Judith Part, MD, 5,000 Units at 02/16/19 8372 .  hydrALAZINE (APRESOLINE) injection 10 mg, 10 mg, Intravenous, Q4H PRN, Rise Patience, MD .  HYDROcodone-acetaminophen (NORCO/VICODIN) 5-325 MG per tablet 1 tablet, 1 tablet, Oral, Q4H PRN, Costella, Vista Mink, PA-C, 1 tablet at 02/15/19 2135 .  HYDROmorphone (DILAUDID) injection 0.5-1 mg, 0.5-1 mg, Intravenous, Q2H PRN, Costella, Vincent J, PA-C, 1 mg at 02/10/19 1144 .  irbesartan (AVAPRO) tablet 300 mg, 300 mg, Oral, Daily, Costella, Vincent J, PA-C, 300 mg at 02/15/19 0939 .  labetalol (NORMODYNE) injection 10-40 mg, 10-40 mg, Intravenous, Q10 min PRN, Costella, Vista Mink, PA-C .  levothyroxine (SYNTHROID) tablet 75 mcg, 75 mcg, Oral, Q0600, Mariel Aloe, MD, 75 mcg at 02/16/19 0618 .  naloxone [facility]) injection 0.08  mg, 0.08 mg, Intravenous, PRN, Costella, Vincent J, PA-C .  ondansetron (ZOFRAN) tablet 4 mg, 4 mg, Oral, Q4H PRN **OR** ondansetron (ZOFRAN) injection 4 mg, 4 mg, Intravenous, Q4H PRN, Costella, Vincent J, PA-C .  oxyCODONE (Oxy IR/ROXICODONE) immediate release tablet 5 mg, 5 mg, Oral, Q4H PRN, Costella, Vincent J, PA-C, 5 mg at 02/11/19 0019 .  pantoprazole (PROTONIX) EC tablet 40 mg, 40 mg, Oral, Q2200, Alvira Philips, [place], 40 mg at 02/15/19 2136 .  polyethylene glycol (MIRALAX / GLYCOLAX) packet 17 g, 17 g, Oral, Daily PRN, Costella, Vincent J, PA-C, 17 g at 02/09/19 0539 .  pramipexole (MIRAPEX) tablet 0.5 mg, 0.5 mg, Oral, QHS, Costella,  Vincent J, PA-C, 0.5 mg at 02/15/19 2135 .  promethazine (PHENERGAN) tablet 12.5-25 mg, 12.5-25 mg, Oral, Q4H PRN, Costella, Vincent J, PA-C .  rosuvastatin (CRESTOR) tablet 10 mg, 10 mg, Oral, QHS, Costella, Vincent J, PA-C, 10 mg at 02/15/19 2135 .  senna (SENOKOT) tablet 8.6 mg, 1 tablet, Oral, BID, Costella, Vincent J, PA-C, 8.6 mg at 02/15/19 2135 .  sertraline (ZOLOFT) tablet 100 mg, 100 mg, Oral, Daily, Costella, Vincent J, PA-C, 100 mg at 02/15/19 0939 .  sodium phosphate (FLEET) 7-19 GM/118ML enema 1 enema, 1 enema, Rectal, Once PRN, Costella, Vista Mink, PA-C .  traZODone (DESYREL) tablet 100 mg, 100 mg, Oral, QHS PRN, Costella, Vista Mink, PA-C, 100 mg at 02/07/19 2155   Physical Exam: AOx3, PERRL, EOMI, FS, Strength 5/5 x4, SILTx4, +RUE dysmetria, incision c/d/i with superior pseudomeningocele, no drainage  Assessment & Plan: 64 y.o. woman  s/p suboccip for met, NAE ON.  -PT/OT rec CIR -dex order is signed & held, if staying inpatient then I would decrease it to 2bid then d/c in 24-48 hours -PM&R c/s, possible CIR transfer pending screening, etc. -TLSO for T12/L3 frx, activity as tolerated in brace when out of bed -SCDs/TEDs, SQH  Judith Part  02/16/19 8:20 AM

## 2019-02-16 NOTE — H&P (Signed)
Physical Medicine and Rehabilitation Admission H&P        Chief Complaint  Patient presents with  . Fall  : HPI: Krista Hodges is a 64 year old right-handed female with history of renal cancer with left radical laparoscopic nephrectomy 05/21/2014, endometrial adenocarcinoma 2017, hypothyroidism after XRT for papillary thyroid cancer, chronic fatigue and immune dysfunction syndrome, , remote tobacco use, early dementia maintained on Aricept as well as BuSpar.  History taken from chart review due to cognition and son taken from patient.  Patient states she lives with spouse 1 level home.  They have a daughter-in-law who can assist as needed.  Patient ambulating with assistance prior to admission.  Presented 02/03/2019 with gait instability.  Abdominal cervical chest CTs revealed multiple nodules diffusely suggestive of metastatic disease.  CT of the head reviewed showing hemorrhagic mass with vasogenic edema.  She underwent suboccipital craniotomy resection of tumor on 02/04/2019 per Dr. Kathyrn Sheriff.  Placed on Keppra for seizure prophylaxis as well as Decadron protocol established.  Subcutaneous heparin for DVT prophylaxis 02/12/2019.  Cardiology services consulted 02/06/2019 for tachybradycardia syndrome and telemetry carefully reviewed showing no episodes of true tachycardia noted she did have some PVCs heart rate as low as 35 but not sustained.  No current indications for pacemaker and monitored.  Patient also found to have compression fractures at T12 and L3 as well as lobulated mass lesion in the medial aspect of the middle right lobe with adjacent satellite nodules and mediastinal and right hilar adenopathy.  She was fitted with a TLSO back brace applied in sitting position.  Tolerating a regular diet.  Therapy evaluations completed and patient was admitted for a comprehensive rehab program.   Review of Systems  Constitutional: Negative for chills and fever.  HENT: Negative for hearing loss.     Eyes: Negative for blurred vision and double vision.  Respiratory: Negative for cough and shortness of breath.   Cardiovascular: Negative for chest pain, palpitations and leg swelling.  Gastrointestinal: Positive for constipation. Negative for heartburn, nausea and vomiting.  Genitourinary: Negative for dysuria, flank pain and hematuria.  Musculoskeletal: Positive for joint pain and myalgias.  Skin: Negative for rash.  Neurological: Positive for dizziness and tremors.       Restless legs  Psychiatric/Behavioral: Positive for depression and memory loss.       Anxiety  All other systems reviewed and are negative.       Past Medical History:  Diagnosis Date  . Allergic rhinitis    . Anxiety state    . Arthritis, degenerative    . Atypical migraine    . Awareness of heartbeats    . Cancer of left kidney (HCC)      Left Kidney  . Cervical radiculitis    . CFIDS (chronic fatigue and immune dysfunction syndrome) ([place])    . Depression, major, single episode, in partial remission ([place])    . Depression, neurotic    . Elevated serum creatinine 03/2016    history of (2.07)  . Endometrial adenocarcinoma ([place])      Stage II grade2  . Essential (primary) hypertension    . Gastric ulcer    . Heart murmur      pt. denies  . HLD (hyperlipidemia)    . Inflammation of hand joint    . Memory difficulties    . Premature complex, ventricular    . Renal disorder    . Renal mass, left    . Restless leg    .  Shortness of breath dyspnea      occasional  . Thrombophlebitis of superficial veins of lower extremity    . Tremor      L arm  . Vitamin D deficiency           Past Surgical History:  Procedure Laterality Date  . c sections x 3       . CARPAL TUNNEL RELEASE        rt hand  . CRANIOTOMY N/A 02/04/2019    Procedure: Suboccipital CRANIECTOMY for  TUMOR EXCISION;  Surgeon: Consuella Lose, MD;  Location: [place];  Service: Neurosurgery;  Laterality: N/A;  . LAPAROSCOPIC NEPHRECTOMY  Left 05/21/2014    Procedure: LEFT RADICAL LAPAROSCOPIC NEPHRECTOMY;  Surgeon: Ardis Hughs, MD;  Location: [facility];  Service: Urology;  Laterality: Left;  Marland Kitchen MANDIBLE SURGERY      . NEPHRECTOMY TRANSPLANTED ORGAN        Left kidney removed  . ROBOTIC ASSISTED TOTAL HYSTERECTOMY WITH BILATERAL SALPINGO OOPHERECTOMY   12/27/2015  . THYROIDECTOMY N/A 05/09/2017    Procedure: TOTAL THYROIDECTOMY WITH LIMITED CENTRAL COMPARTMENT LYMPH NODE DISSECTION;  Surgeon: Armandina Gemma, MD;  Location: [facility];  Service: General;  Laterality: N/A;  . TOTAL THYROIDECTOMY        05-09-17 Dr. Harlow Asa         Family History  Problem Relation Age of Onset  . Cancer Mother    . Heart attack Father    . Diabetes Other    . Dementia Neg Hx      Social History:  reports that she quit smoking about 11 years ago. Her smoking use included cigarettes. She smoked 1.00 pack per day. She has never used smokeless tobacco. She reports current alcohol use. She reports that she does not use drugs. Allergies:  Allergies  Allergen Reactions  . Penicillins Hives      [place]. Has tolerated cephazolin and cephalexin. Has patient had a PCN reaction causing immediate rash, facial/tongue/throat swelling, SOB or lightheadedness with hypotension: Yes (per pt report) Has patient had a PCN reaction causing severe rash involving mucus membranes or skin necrosis: Yes Has patient had a PCN reaction that required hospitalization: No Has patient had a PCN reaction occurring within the last 10 years: No If all of the above answers are "NO", then may proceed with Cephalosporin  . Aspirin        Cannot take due to nephrectomy  . Keppra [Levetiracetam] Nausea Only      Anxiety   . Nsaids        Cannot take due to nephrectomy  . Requip [Ropinirole Hcl]        Headaches, restlessness          Medications Prior to Admission  Medication Sig Dispense Refill  . acetaminophen (TYLENOL) 500 MG tablet Take 500-1,000 mg by mouth daily as  needed for moderate pain or headache.      Marland Kitchen buPROPion (WELLBUTRIN) 100 MG tablet Take 100 mg by mouth daily.       . busPIRone (BUSPAR) 15 MG tablet Take 15 mg by mouth 2 (two) times daily.      Marland Kitchen donepezil (ARICEPT) 10 MG tablet Take 10 mg by mouth at bedtime.       . hydrochlorothiazide (HYDRODIURIL) 25 MG tablet Take 25 mg by mouth daily.      . irbesartan (AVAPRO) 300 MG tablet Take 300 mg daily by mouth.       . levothyroxine (SYNTHROID) 50 MCG  tablet Take 50 mcg by mouth daily.      . memantine (NAMENDA) 5 MG tablet Take 5 mg by mouth 2 (two) times daily.      . pramipexole (MIRAPEX) 0.5 MG tablet Take 0.5 mg by mouth at bedtime.      Marland Kitchen Propylene Glycol (SYSTANE BALANCE) 0.6 % SOLN Place 1 drop into both eyes daily.      . rosuvastatin (CRESTOR) 10 MG tablet Take 10 mg by mouth daily.       . sertraline (ZOLOFT) 100 MG tablet Take 50 mg by mouth 2 (two) times daily.      Marland Kitchen tiZANidine (ZANAFLEX) 4 MG tablet Take 4 mg by mouth as needed for muscle spasms.       . traMADol-acetaminophen (ULTRACET) 37.5-325 MG tablet Take 1 tablet by mouth as needed for moderate pain.       . traZODone (DESYREL) 50 MG tablet Take 50-100 mg by mouth at bedtime as needed for sleep.       . calcium carbonate (TUMS) 500 MG chewable tablet Chew 2 tablets (400 mg of elemental calcium total) by mouth 2 (two) times daily. (Patient not taking: Reported on 02/03/2019) 90 tablet 1  . docusate sodium (COLACE) 100 MG capsule Take 1 capsule (100 mg total) by mouth 2 (two) times daily as needed (take to keep stool soft.). (Patient not taking: Reported on 02/03/2019) 60 capsule 0  . traMADol (ULTRAM) 50 MG tablet Take 1-2 tablets (50-100 mg total) by mouth every 6 (six) hours as needed for moderate pain (mild pain not responsive to acetaminophen). (Patient not taking: Reported on 02/03/2019) 15 tablet 0      Drug Regimen Review Drug regimen was reviewed and remains appropriate with no significant issues identified    Home: Home Living Family/patient expects to be discharged to:: Private residence Living Arrangements: Spouse/significant other Available Help at Discharge: Family, Available 24 hours/day Type of Home: Apartment Home Access: Level entry Home Layout: One level Bathroom Shower/Tub: Multimedia programmer: Standard Home Equipment: Wheelchair - manual, Environmental consultant - 2 wheels, [place] - single point, Grab bars - tub/shower Additional Comments: daughter in law present and will be able to help at home. She thinks they have a 3n1 at home but uncertain   Functional History: Prior Function Level of Independence: Needs assistance Gait / Transfers Assistance Needed: was walking with assist grossly 20' then unable to walk for 2 weeks PTA ADL's / Homemaking Assistance Needed: bathing and dressing on her own until 2 weeks PtA and since that time assist for all   Functional Status:  Mobility: Bed Mobility Overal bed mobility: Needs Assistance Bed Mobility: Rolling, Sidelying to Sit Rolling: Min assist Sidelying to sit: Mod assist General bed mobility comments: pt has improved over last treatment, but needs lots of tactile cuing and truncal assist slow enough for pt to engage R UE assist Transfers Overall transfer level: Needs assistance Equipment used: Rolling walker (2 wheeled) Transfers: Sit to/from Stand Sit to Stand: Min assist Stand pivot transfers: Min assist General transfer comment: repetitive/consistent cues for technique, direction and hand placement.  Assist to stay forward and minimal boost. Ambulation/Gait Ambulation/Gait assistance: Mod assist Gait Distance (Feet): 15 Feet(to bathroom, then 35 feet into hall and back to the chair.) Assistive device: Rolling walker (2 wheeled) Gait Pattern/deviations: Step-to pattern, Step-through pattern General Gait Details: short guarded steps, too far outside the RW. Consistently veering to the Left, needing assist to direct to the right.   Stiff and  guarded overall. Gait velocity: slow Gait velocity interpretation: <1.31 ft/sec, indicative of household ambulator   ADL: ADL Overall ADL's : Needs assistance/impaired Grooming: Wash/dry hands, Moderate assistance, Standing Grooming Details (indicate cue type and reason): cues to avoid leaning over at sink level onto elbow Upper Body Bathing: Minimal assistance Lower Body Bathing: Moderate assistance, Sit to/from stand Upper Body Dressing : Sitting, Maximal assistance Upper Body Dressing Details (indicate cue type and reason): assistance to don brace Toilet Transfer: Minimal assistance, Cueing for safety, Cueing for sequencing, Ambulation, RW Toilet Transfer Details (indicate cue type and reason): simulated from eob taking approx. 10 steps to recliner (very small short steps), cues for rw management, asks "did we make it tell me when i can sit" Toileting- Clothing Manipulation and Hygiene: Moderate assistance, Sit to/from stand Toileting - Clothing Manipulation Details (indicate cue type and reason): pt able to power up and attmepting to wipe with flexed posture with cues that squating is required Functional mobility during ADLs: Minimal assistance, Rolling walker General ADL Comments: able to complete bed mobility, and simulated toilet transfer in room with eob and ambulation to recliner.   Cognition: Cognition Overall Cognitive Status: Impaired/Different from baseline(more interactive and focus, but not tested formally) Orientation Level: Oriented to person, Oriented to time, Disoriented to place, Disoriented to situation Cognition Arousal/Alertness: Awake/alert Behavior During Therapy: WFL for tasks assessed/performed Overall Cognitive Status: Impaired/Different from baseline(more interactive and focus, but not tested formally) Area of Impairment: Orientation, Attention, Memory, Safety/judgement, Following commands, Awareness, Problem solving Orientation Level:  Situation Current Attention Level: Sustained Memory: Decreased recall of precautions Following Commands: Follows one step commands with increased time Safety/Judgement: Decreased awareness of deficits Awareness: Emergent Problem Solving: Slow processing, Decreased initiation, Difficulty sequencing General Comments: max directional verbal and tactile cues to complete task and for safety   Physical Exam: Blood pressure 136/68, pulse (!) 51, temperature 97.7 F (36.5 C), temperature source Oral, resp. rate 13, height 5\' 6"  (1.676 m), weight 65 kg, SpO2 96 %. Physical Exam  Constitutional: No distress.  HENT:  Right Ear: External ear normal.  Left Ear: External ear normal.  Eyes: EOM are normal.  Neck: No tracheal deviation present. No thyromegaly present.  Cardiovascular: Regular rhythm.  bradycardic  Respiratory: Effort normal. No respiratory distress. She has no wheezes.  GI: Soft. She exhibits no distension. There is no abdominal tenderness.  Musculoskeletal:        General: No edema. Normal range of motion.     Comments: Back brace in place. Low back tender with trunk and lower ext movement.   Neurological: She is alert.  Oriented to self, "hospital". Could not provide date, place without cueing. Knew she was in hospital after brain surgery. No focal CN abnl. Right limb ataxia. UE grossly 4/5 prox to distal. LE 3- prox to 4/5 distally d/t back pain.  No focal sensory deficits  Skin: She is not diaphoretic.  Cervical-->occipital staples in place. Incision CDI. Bruising along right lateral aspect and behind ear.   Psychiatric: She has a normal mood and affect. Her behavior is normal.      Lab Results Last 48 Hours  No results found for this or any previous visit (from the past 48 hour(s)).   Imaging Results (Last 48 hours)  No results found.           Medical Problem List and Plan: 1.  Decreased functional mobility secondary to diffuse metastatic disease with cerebellar  tumor status post resection 02/04/2019 as well as compression  deformities T12 and L3 with conservative care and TLSO back brace when out of bed.  Patient with slow Decadron taper decreased to 2 mg twice daily x48 hours and stop             -patient may  shower  -admit to inpatient rehab             -ELOS/Goals: 17-20 days, min assist goals PT, OT, supervision SLP 2.  Antithrombotics: -DVT/anticoagulation: Subcutaneous heparin.  Check vascular study             -antiplatelet therapy: N/A 3. Pain Management: Hydrocodone as needed             -back brace  -consider pre-treating for therapies 4.  Zoloft 100 mg daily,: Wellbutrin 200 mg daily, BuSpar 15mg  twice daily             -antipsychotic agents: N/A 5. Neuropsych: This patient is not capable of making decisions on her own behalf. 6. Skin/Wound Care: Routine skin checks             -probably can remove staples from cervical-occipital incision in AM  7. Fluids/Electrolytes/Nutrition: Routine in and outs with follow-up chemistries 8.  Hypertension.  Avapro 300 mg daily.  Monitor with increased mobility 9.  Hypothyroidism.  Synthroid 10.  Hyperlipidemia.  Crestor 11.  Restless leg syndrome.  Mirapex 0.5 mg nightly 12.  History of renal cancer with left radical laparoscopic nephrectomy 05/21/2014.  As well as history of endometrial adenocarcinoma 2017.  Follow-up outpatient 13.  Constipation.  Laxative assistance     Cathlyn Parsons, PA-C 02/12/2019  I have personally performed a face to face diagnostic evaluation of this patient and formulated the key components of the plan.  Additionally, I have personally reviewed laboratory data, imaging studies, as well as relevant notes and concur with the physician assistant's documentation above.  The patient's status has not changed from the original H&P.  Any changes in documentation from the acute care chart have been noted above.  Meredith Staggers, MD, [facility]rown

## 2019-02-16 NOTE — PMR Pre-admission (Signed)
PMR Admission Coordinator Pre-Admission Assessment  Patient: Krista Hodges is an 64 y.o., female MRN: [id number] DOB: 08/09/1955 Height: 5\' 6"  (167.6 cm) Weight: 65 kg              Insurance Information HMO:  yes    PPO:      PCP:      IPA:      80/20:      OTHER:  PRIMARY: U[employer] Medicare      Policy#: [id number]      Subscriber: pt  CM Name: Claiborne Billings       Phone#: [phone]      Fax#: [phone] Pre-Cert#: [id number] [place] provided by Claiborne Billings for CIR admit for 7 days with updates due to 02/18/2019 to fax listed above     Employer: n/a Benefits:  Phone #: [phone]     Name:  Eff. Date: 02/13/19     Deduct: $0      Out of Pocket Max: $4500      Life Max: n/a CIR: $325/day for days 1-5      SNF: 20 full days Outpatient:      Co-Pay: $35/visit Home Health:  100%      Co-Pay: DME: 80%     Co-Pay: 20% Providers:  SECONDARY:       Policy#:       Subscriber:  CM Name:       Phone#:      Fax#:  Pre-Cert#:       Employer:  Benefits:  Phone #:      Name:  Eff. Date:      Deduct:       Out of Pocket Max:       Life Max:  CIR:       SNF:  Outpatient:      Co-Pay:  Home Health:       Co-Pay:  DME:      Co-Pay:   Medicaid Application Date:       Case Manager:  Disability Application Date:       Case Worker:   The "Data Collection Information Summary" for patients in Inpatient Rehabilitation Facilities with attached "Privacy Act [place] Records" was provided and verbally reviewed with: Patient and Family  Emergency Contact Information Contact Information    Name Relation Home Work Mobile   [place] Spouse [phone]  [phone]   [place] Daughter   [phone]     Current Medical History  Patient Admitting Diagnosis: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 right-handed female with history of renal cancer with left radical laparoscopic nephrectomy 05/21/2014, endometrial adenocarcinoma  2017, hypothyroidism after XRT for papillary thyroid cancer, chronic fatigue and immune dysfunction syndrome, CKD stage III, remote tobacco abuse, early dementia maintained on Aricept as well as BuSpar.  She presented on 02/03/2019 with gait instability.  Abdominal/cervical/chest CTs revealed multiple nodules diffusely suggestive of metastatic disease.  CT head personally reviewed, showing hemorrhagic mass with vasogenic edema.  She underwent suboccipital craniotomy resection of tumor on 02/04/2019 per Dr. Kathyrn Sheriff.  Placed on Keppra for seizure prophylaxis as well as Decadron protocol established.  Cardiology services consulted 02/06/2019 for tachy/brady and telemetry carefully reviewed no episodes of true tachycardia noted she did have some PVCs heart rate as low as 35 but not sustained.  No current indications for pacemaker and monitored.. Patient also found to have compression deformities at T12 and L3 as well as a lobulated mass  lesion in the medial aspect of the right middle lobe with adjacent satellite nodules and mediastinal and right hilar adenopathy.  Patient was fitted with a TLSO back brace applied in sitting position.  Tolerating a regular diet.  Therapy evaluations completed with recommendations of physical medicine rehab consult.   Glasgow Coma Scale Score: 15  Past Medical History  Past Medical History:  Diagnosis Date  . Allergic rhinitis   . Anxiety state   . Arthritis, degenerative   . Atypical migraine   . Awareness of heartbeats   . Cancer of left kidney (HCC)    Left Kidney  . Cervical radiculitis   . CFIDS (chronic fatigue and immune dysfunction syndrome) ([place])   . Depression, major, single episode, in partial remission ([place])   . Depression, neurotic   . Elevated serum creatinine 03/2016   history of (2.07)  . Endometrial adenocarcinoma ([place])    Stage II grade2  . Essential (primary) hypertension   . Gastric ulcer   . Heart murmur    pt. denies  . HLD  (hyperlipidemia)   . Inflammation of hand joint   . Memory difficulties   . Premature complex, ventricular   . Renal disorder   . Renal mass, left   . Restless leg   . Shortness of breath dyspnea    occasional  . 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 Tremor    L arm  . Vitamin D deficiency     Family History  family history includes Cancer in her mother; Diabetes in an other family member; Heart attack in her father.  Prior Rehab/Hospitalizations:  Has the patient had prior rehab or hospitalizations prior to admission? No  Has the patient had major surgery during 100 days prior to admission? Yes  Current Medications   Current Facility-Administered Medications:  .  0.9 %  sodium chloride infusion, , Intravenous, Continuous, Suzette Battiest, MD, Last Rate: 200 mL/hr at 02/04/19 1557, New Bag at 02/04/19 1557 .  acetaminophen (TYLENOL) tablet 650 mg, 650 mg, Oral, Q4H PRN, 650 mg at 02/12/19 0046 **OR** acetaminophen (TYLENOL) suppository 650 mg, 650 mg, Rectal, Q4H PRN, Costella, Vincent J, PA-C .  bisacodyl (DULCOLAX) suppository 10 mg, 10 mg, Rectal, Daily PRN, Costella, Vincent J, PA-C, 10 mg at 02/09/19 0956 .  buPROPion [facility]) tablet 200 mg, 200 mg, Oral, Daily, Costella, Vincent J, PA-C, 200 mg at 02/16/19 0909 .  busPIRone (BUSPAR) tablet 15 mg, 15 mg, Oral, BID, Costella, Vincent J, PA-C, 15 mg at 02/16/19 0909 .  dexamethasone (DECADRON) tablet 4 mg, 4 mg, Oral, BID, Judith Part, MD, 4 mg at 02/16/19 0909 .  docusate sodium (COLACE) capsule 100 mg, 100 mg, Oral, BID, Costella, Vincent J, PA-C, 100 mg at 02/16/19 0909 .  doxylamine (Sleep) (UNISOM) tablet 25 mg, 25 mg, Oral, QHS PRN, Costella, Vincent J, PA-C, 25 mg at 02/04/19 [id number] .  heparin injection 5,000 Units, 5,000 Units, Subcutaneous, Q8H, Judith Part, MD, 5,000 Units at 02/16/19 [id number] .  hydrALAZINE (APRESOLINE) injection 10 mg, 10 mg, Intravenous, Q4H PRN, Rise Patience, MD .  HYDROcodone-acetaminophen (NORCO/VICODIN) 5-325 MG per tablet 1 tablet, 1 tablet, Oral, Q4H PRN, Costella, Vista Mink, PA-C, 1 tablet at 02/16/19 0910 .  HYDROmorphone (DILAUDID) injection 0.5-1 mg, 0.5-1 mg, Intravenous, Q2H PRN, Costella, Vincent J, PA-C, 1 mg at 02/10/19 1144 .  irbesartan (AVAPRO) tablet 300 mg, 300 mg, Oral, Daily, Costella, Vincent J, PA-C, 300 mg at 02/16/19 0910 .  labetalol (NORMODYNE) injection 10-40 mg, 10-40 mg, Intravenous, Q10 min PRN, Costella, Vista Mink, PA-C .  levothyroxine (SYNTHROID) tablet 75 mcg, 75 mcg, Oral, Q0600, Mariel Aloe, MD, 75 mcg at 02/16/19 0618 .  naloxone [facility]) injection 0.08 mg, 0.08 mg, Intravenous, PRN, Costella, Vincent J, PA-C .  ondansetron (ZOFRAN) tablet 4 mg, 4 mg, Oral, Q4H PRN **OR** ondansetron (ZOFRAN) injection 4 mg, 4 mg, Intravenous, Q4H PRN, Costella, Vincent J, PA-C .  oxyCODONE (Oxy IR/ROXICODONE) immediate release tablet 5 mg, 5 mg, Oral, Q4H PRN, Costella, Vincent J, PA-C, 5 mg at 02/11/19 0019 .  pantoprazole (PROTONIX) EC tablet 40 mg, 40 mg, Oral, Q2200, Alvira Philips, [place], 40 mg at 02/15/19 2136 .  polyethylene glycol (MIRALAX / GLYCOLAX) packet 17 g, 17 g, Oral, Daily PRN, Costella, Vincent J, PA-C, 17 g at 02/09/19 0539 .  pramipexole (MIRAPEX) tablet 0.5 mg, 0.5 mg, Oral, QHS, Costella, Vincent J, PA-C, 0.5 mg at 02/15/19 2135 .  promethazine (PHENERGAN) tablet 12.5-25 mg, 12.5-25 mg, Oral, Q4H PRN, Costella, Vincent J, PA-C .  rosuvastatin (CRESTOR) tablet 10 mg, 10 mg, Oral, QHS, Costella, Vincent J, PA-C, 10 mg at 02/15/19 2135 .  senna (SENOKOT) tablet 8.6 mg, 1 tablet, Oral, BID, Costella, Vincent J, PA-C, 8.6 mg at 02/16/19 0909 .  sertraline (ZOLOFT) tablet 100 mg, 100 mg, Oral, Daily, Costella, Vincent J, PA-C, 100 mg at 02/16/19 0910 .  sodium phosphate (FLEET) 7-19 GM/118ML enema 1 enema, 1 enema, Rectal, Once PRN, Costella, Vista Mink, PA-C .  traZODone (DESYREL) tablet 100 mg, 100  mg, Oral, QHS PRN, Costella, Vincent Lenna Sciara, PA-C, 100 mg at 02/07/19 2155  Patients Current Diet:  Diet Order            Diet Heart Room service appropriate? Yes with Assist; Fluid consistency: Thin  Diet effective now              Precautions / Restrictions Precautions Precautions: Fall, Back Precaution Booklet Issued: Yes (comment) Precaution Comments: diplopia Spinal Brace: Thoracolumbosacral orthotic, Applied in sitting position Restrictions Weight Bearing Restrictions: No   Has the patient had 2 or more falls or a fall with injury in the past year?Yes  Prior Activity Level Limited Community (1-2x/wk): decline in the last 2 weeks, requiring more assist for ADLs  Prior Functional Level Prior Function Level of Independence: Needs assistance Gait / Transfers Assistance Needed: was walking with assist grossly 20' then unable to walk for 2 weeks PTA ADL's / Homemaking Assistance Needed: bathing and dressing on her own until 2 weeks PtA and since that time assist for all  Self Care: Did the patient need help bathing, dressing, using the toilet or eating?  Needed some help  Indoor Mobility: Did the patient need assistance with walking from room to room (with or without device)? Needed some help  Stairs: Did the patient need assistance with internal or external stairs (with or without device)? Needed some help  Functional Cognition: Did the patient need help planning regular tasks such as shopping or remembering to take medications? Needed some help  Home Assistive Devices / Equipment Home Equipment: Wheelchair - manual, Environmental consultant - 2 wheels, [place] - single point, Grab bars - tub/shower  Prior Device Use: Indicate devices/aids used by the patient prior to current illness, exacerbation or injury? Walker  Current Functional Level Cognition  Overall Cognitive Status: (Nt formally, pt was participative and followed commands) Current Attention Level: Selective Orientation Level:  Oriented to person, Oriented to time, Disoriented to  place, Disoriented to situation Following Commands: Follows one step commands with increased time, Follows one step commands consistently Safety/Judgement: Decreased awareness of deficits General Comments: max directional verbal and tactile cues to complete task and for safety    Extremity Assessment (includes Sensation/Coordination)  Upper Extremity Assessment: Generalized weakness  Lower Extremity Assessment: Generalized weakness    ADLs  Overall ADL's : Needs assistance/impaired Grooming: Wash/dry hands, Moderate assistance, Standing Grooming Details (indicate cue type and reason): cues to avoid leaning over at sink level onto elbow Upper Body Bathing: Minimal assistance Lower Body Bathing: Moderate assistance, Sit to/from stand Upper Body Dressing : Sitting, Maximal assistance Upper Body Dressing Details (indicate cue type and reason): assistance to don brace Toilet Transfer: Minimal assistance, Cueing for safety, Cueing for sequencing, Ambulation, RW Toilet Transfer Details (indicate cue type and reason): simulated from eob taking approx. 10 steps to recliner (very small short steps), cues for rw management, asks "did we make it tell me when i can sit" Toileting- Clothing Manipulation and Hygiene: Moderate assistance, Sit to/from stand Toileting - Clothing Manipulation Details (indicate cue type and reason): pt able to power up and attmepting to wipe with flexed posture with cues that squating is required Functional mobility during ADLs: Minimal assistance, Rolling walker General ADL Comments: able to complete bed mobility, and simulated toilet transfer in room with eob and ambulation to recliner.    Mobility  Overal bed mobility: Needs Assistance Bed Mobility: Rolling, Sidelying to Sit Rolling: Min assist Sidelying to sit: Mod assist Sit to sidelying: Mod assist, HOB elevated General bed mobility comments: practiced building  momentum during roll and transition to sitting EOB    Transfers  Overall transfer level: Needs assistance Equipment used: Rolling walker (2 wheeled) Transfers: Sit to/from Stand Sit to Stand: Mod assist, Max assist Stand pivot transfers: Mod assist General transfer comment: Initially, pt needed extra assist to list forward with transition of weight from heels to forefeet.  It took several minutes to get pt to shift forward.    Ambulation / Gait / Stairs / Wheelchair Mobility  Ambulation/Gait Ambulation/Gait assistance: Mod assist Gait Distance (Feet): 25 Feet Assistive device: Rolling walker (2 wheeled) Gait Pattern/deviations: Step-to pattern, Step-through pattern General Gait Details: short, tentative and mildly retropulsive in nature.  Consistent cues to stay closer into the RW. Gait velocity: slow Gait velocity interpretation: <1.31 ft/sec, indicative of household ambulator    Posture / Balance Dynamic Sitting Balance Sitting balance - Comments: but min guard only,  tending to list posteriorly Balance Overall balance assessment: Needs assistance Sitting-balance support: Single extremity supported, No upper extremity supported Sitting balance-Leahy Scale: Fair Sitting balance - Comments: but min guard only,  tending to list posteriorly Standing balance support: Bilateral upper extremity supported Standing balance-Leahy Scale: Poor Standing balance comment: required AD and external support    Special needs/care consideration BiPAP/CPAP no CPM no Continuous Drip IV no Dialysis no        Days n/a Life Vest no Oxygen no Special Bed no Trach Size no Wound Vac (area) no      Location n/a Skin                             Location Bowel mgmt: Bladder mgmt: Diabetic mgmt no Behavioral consideration early dementia, some sundowing Chemo/radiation      Previous Home Environment (from acute therapy documentation) Living Arrangements: Spouse/significant other Available Help at  Discharge: Family, Available 24 hours/day Type of  Home: Apartment Home Layout: One level Home Access: Level entry Bathroom Shower/Tub: Multimedia programmer: Standard Additional Comments: daughter in law present and will be able to help at home. She thinks they have a 3n1 at home but uncertain  Discharge Living Setting Plans for Discharge Living Setting: Patient's home Type of Home at Discharge: House(in law suite) Discharge Home Layout: One level Discharge Home Access: Level entry Discharge Bathroom Shower/Tub: Walk-in shower Discharge Bathroom Toilet: Standard Discharge [place] Accessibility: Yes How Accessible: Accessible via wheelchair Does the patient have any problems obtaining your medications?: No  Social/Family/Support Systems Anticipated Caregiver: spouse Marcello Moores) and hired caregivers during the day, if needed; children [name], [place]) work and available outside work hours Anticipated [place]: [phone] Marcello Moores), [phone] [name]) Ability/Limitations of Caregiver: supervision for [employer] Caregiver Availability: 24/7 Discharge Plan Discussed with Primary Caregiver: Yes Is Caregiver In Agreement with Plan?: Yes Does Caregiver/Family have Issues with Lodging/Transportation while Pt is in Rehab?: No   Goals/Additional Needs Patient/Family Goal for Rehab: PT/OT/SLP supervision Expected length of stay: 17-20 days Additional Information: sundowning Pt/Family Agrees to Admission and willing to participate: Yes Program Orientation Provided & Reviewed with Pt/Caregiver Including Roles  & Responsibilities: Yes   Decrease burden of Care through IP rehab admission: n/a   Possible need for SNF placement upon discharge: not anticipated   Patient Condition: This patient's medical and functional status has changed since the consult dated: 02/12/2019 in which the Rehabilitation Physician determined and documented that the patient's condition is  appropriate for intensive rehabilitative care in an inpatient rehabilitation facility. See "History of Present Illness" (above) for medical update. Functional changes are: pt min assist. Patient's medical and functional status update has been discussed with the Rehabilitation physician and patient remains appropriate for inpatient rehabilitation. Will admit to inpatient rehab today.  Preadmission Screen Completed By:  Michel Santee, PT, 02/16/2019 1:51 PM ______________________________________________________________________   Discussed status with Dr. Naaman Plummer on 02/16/19 at 1:58 PM  and received approval for admission today.  Admission Coordinator:  Michel Santee, PT, DPT time 1:58 PM Sudie Grumbling 02/16/19

## 2019-02-16 NOTE — Progress Notes (Signed)
Physical Therapy Treatment Patient Details Name: Krista Hodges MRN: [id number] DOB: [birth year] Today's Date: 02/16/2019    History of Present Illness 64 yo admitted with falls and weakness found to have T12 and L3 fx to be treated with TLSO for OOB and cerebellar mass s/p craniotomy with MRI also demonstrating multiple breast, lung, adrenal and possible liver mass. PMHx: HTN, depression, HLD and multiple cancers including ?RCC s/p nephrectomy around 2015, endometrial adenocarcinoma 2017, papillary thyroid carcinoma 2019 s/p radiation. Pt also recently diagnosed with dementia.    PT Comments    Pt appears to be more alert and participative.  Emphasis on transition to sitting EOB, scooting, sit to stand and balance at EOB, (pt with moderate posterior list), transfers and gait with the RW.    Follow Up Recommendations  CIR;Supervision/Assistance - 24 hour     Equipment Recommendations  3in1 (PT)    Recommendations for Other Services       Precautions / Restrictions Precautions Precautions: Fall;Back Precaution Booklet Issued: Yes (comment) Required Braces or Orthoses: Spinal Brace Spinal Brace: Thoracolumbosacral orthotic;Applied in sitting position    Mobility  Bed Mobility Overal bed mobility: Needs Assistance Bed Mobility: Rolling;Sidelying to Sit Rolling: Min assist Sidelying to sit: Mod assist       General bed mobility comments: practiced building momentum during roll and transition to sitting EOB  Transfers Overall transfer level: Needs assistance Equipment used: Rolling walker (2 wheeled) Transfers: Sit to/from Stand Sit to Stand: Mod assist;Max assist Stand pivot transfers: Mod assist       General transfer comment: Initially, pt needed extra assist to list forward with transition of weight from heels to forefeet.  It took several minutes to get pt to shift forward.  Ambulation/Gait Ambulation/Gait assistance: Mod assist Gait Distance (Feet): 25  Feet Assistive device: Rolling walker (2 wheeled) Gait Pattern/deviations: Step-to pattern;Step-through pattern Gait velocity: slow   General Gait Details: short, tentative and mildly retropulsive in nature.  Consistent cues to stay closer into the RW.   Stairs             Wheelchair Mobility    Modified Rankin (Stroke Patients Only)       Balance Overall balance assessment: Needs assistance Sitting-balance support: Single extremity supported;No upper extremity supported Sitting balance-Leahy Scale: Fair Sitting balance - Comments: but min guard only,  tending to list posteriorly   Standing balance support: Bilateral upper extremity supported Standing balance-Leahy Scale: Poor Standing balance comment: required AD and external support                            Cognition Arousal/Alertness: Awake/alert Behavior During Therapy: WFL for tasks assessed/performed Overall Cognitive Status: (Nt formally, pt was participative and followed commands)                             Awareness: Emergent Problem Solving: Slow processing;Decreased initiation;Difficulty sequencing;Requires tactile cues;Requires verbal cues        Exercises      General Comments General comments (skin integrity, edema, etc.): reinforced all back care, bracing issues and progression of activity from this point.      Pertinent Vitals/Pain Pain Assessment: Faces Faces Pain Scale: Hurts little more Pain Location: back Pain Descriptors / Indicators: Aching;Discomfort Pain Intervention(s): Monitored during session    Home Living  Prior Function            PT Goals (current goals can now be found in the care plan section) Acute Rehab PT Goals Patient Stated Goal: home PT Goal Formulation: With patient/family Time For Goal Achievement: 02/23/19 Potential to Achieve Goals: Good Progress towards PT goals: Progressing toward goals     Frequency    Min 4X/week      PT Plan Current plan remains appropriate    Co-evaluation              AM-PAC PT "6 Clicks" Mobility   Outcome Measure  Help needed turning from your back to your side while in a flat bed without using bedrails?: A Lot Help needed moving from lying on your back to sitting on the side of a flat bed without using bedrails?: A Lot Help needed moving to and from a bed to a chair (including a wheelchair)?: A Lot Help needed standing up from a chair using your arms (e.g., wheelchair or bedside chair)?: A Lot Help needed to walk in hospital room?: A Lot Help needed climbing 3-5 steps with a railing? : Total 6 Click Score: 11    End of Session Equipment Utilized During Treatment: Back brace Activity Tolerance: Patient tolerated treatment well;Patient limited by fatigue Patient left: in chair;with call bell/phone within reach;with chair alarm set Nurse Communication: Mobility status PT Visit Diagnosis: Other abnormalities of gait and mobility (R26.89);Pain;Other symptoms and signs involving the nervous system (R29.898) Pain - part of body: (back)     Time: [id number] PT Time Calculation (min) (ACUTE ONLY): 24 min  Charges:  $Gait Training: 8-22 mins $Therapeutic Activity: 8-22 mins                     02/16/2019  Ginger Carne., PT Acute Rehabilitation Services [phone]  (pager) [phone]  (office)   Tessie Fass Alexandra Posadas 02/16/2019, 12:54 PM

## 2019-02-16 NOTE — Progress Notes (Signed)
Pt transported to 4W23 with all belongings. Spouse called and updated on admission to rehab.

## 2019-02-16 NOTE — TOC Transition Note (Signed)
Transition of Care [facility]) - CM/SW Discharge Note [name] RN,BSN Transitions of Care Unit 4NP (non trauma) - RN Case Manager [phone]   Patient Details  Name: MERRICK PROBST MRN: [id number] Date of Birth: 1955/07/30  [facility] CM/SW Contact:  [name], RN Phone Number: 02/16/2019, 11:24 AM   Clinical Narrative:    Pt has been approved by insurance for CIR and they have bed available today for admission per [facility] with [facility]. Pt is stable for transition to [place] rehab and MD has placed d/c order. Received call from daughter Precious Bard regarding transition plan- asking if insurance had approved- informed daughter that pt had received approval and plan would be to transition to [facility] re[facility] later today.    Final next level of care: IP Rehab Facility Barriers to Discharge: Barriers Resolved   Patient Goals and CMS Choice Patient states their goals for this hospitalization and ongoing recovery are:: rehab CMS Medicare.gov Compare Post Acute Care list provided to:: Patient Represenative (must comment)(spouse/daughter Precious Bard) Choice offered to / list presented to : Spouse, Adult Children  Discharge Placement                 [facility].       Discharge Plan and Services In-house Referral: Clinical Social Work Discharge Planning Services: CM Consult Post Acute Care Choice: IP Rehab                               Social Determinants of Health (SDOH) Interventions     Readmission Risk Interventions No flowsheet data found.

## 2019-02-16 NOTE — Plan of Care (Signed)
  Problem: Consults Goal: RH BRAIN INJURY PATIENT EDUCATION Description: Description: See Patient Education module for eduction specifics Outcome: Progressing Goal: Skin Care Protocol Initiated - if Braden Score 18 or less Description: If consults are not indicated, leave blank or document N/A Outcome: Progressing Goal: Nutrition Consult-if indicated Outcome: Progressing Goal: Diabetes Guidelines if Diabetic/Glucose > 140 Description: If diabetic or lab glucose is > 140 mg/dl - Initiate Diabetes/Hyperglycemia Guidelines & Document Interventions  Outcome: Progressing   Problem: RH BOWEL ELIMINATION Goal: RH STG MANAGE BOWEL WITH ASSISTANCE Description: STG Manage Bowel with Assistance. Outcome: Progressing Flowsheets (Taken 02/16/2019 1716) STG: Pt will manage bowels with assistance: 4-Minimum assistance Goal: RH STG MANAGE BOWEL W/MEDICATION W/ASSISTANCE Description: STG Manage Bowel with Medication with Assistance. Outcome: Progressing Flowsheets (Taken 02/16/2019 1716) STG: Pt will manage bowels with medication with assistance: 4-Minimal assistance   Problem: RH BLADDER ELIMINATION Goal: RH STG MANAGE BLADDER WITH ASSISTANCE Description: STG Manage Bladder With Assistance Outcome: Progressing Flowsheets (Taken 02/16/2019 1716) STG: Pt will manage bladder with assistance: 4-Minimal assistance Goal: RH STG MANAGE BLADDER WITH MEDICATION WITH ASSISTANCE Description: STG Manage Bladder With Medication With Assistance. Outcome: Progressing Flowsheets (Taken 02/16/2019 1716) STG: Pt will manage bladder with medication with assistance: 4-Minimal assistance Goal: RH STG MANAGE BLADDER WITH EQUIPMENT WITH ASSISTANCE Description: STG Manage Bladder With Equipment With Assistance Outcome: Progressing Flowsheets (Taken 02/16/2019 1716) STG: Pt will manage bladder with equipment with assistance: 4-Minimal assistance   Problem: RH SKIN INTEGRITY Goal: RH STG SKIN FREE OF  INFECTION/BREAKDOWN Outcome: Progressing Goal: RH STG ABLE TO PERFORM INCISION/WOUND CARE W/ASSISTANCE Description: STG Able To Perform Incision/Wound Care With Assistance. Outcome: Progressing Flowsheets (Taken 02/16/2019 1716) STG: Pt will be able to perform incision/wound care with assistance: 4-Minimal assistance   Problem: RH SAFETY Goal: RH STG ADHERE TO SAFETY PRECAUTIONS W/ASSISTANCE/DEVICE Description: STG Adhere to Safety Precautions With Assistance/Device. Outcome: Progressing Flowsheets (Taken 02/16/2019 1716) STG:Pt will adhere to safety precautions with assistance/device: 3-Moderate assistance Goal: RH STG DECREASED RISK OF FALL WITH ASSISTANCE Description: STG Decreased Risk of Fall With Assistance. Outcome: Progressing Flowsheets (Taken 02/16/2019 1716) [id number] risk of fall  with assistance/device: 3-Moderate assistance   Problem: RH COGNITION-NURSING Goal: RH STG USES MEMORY AIDS/STRATEGIES W/ASSIST TO PROBLEM SOLVE Description: STG Uses Memory Aids/Strategies With Assistance to Problem Solve. Outcome: Progressing   Problem: RH KNOWLEDGE DEFICIT BRAIN INJURY Goal: RH STG INCREASE KNOWLEDGE OF SELF CARE AFTER BRAIN INJURY Outcome: Progressing

## 2019-02-16 NOTE — Discharge Instructions (Signed)
Discharge Instructions  No restriction in activities, slowly increase your activity back to normal.   Okay to shower on the day of discharge. Use regular soap and water and try to be gentle when cleaning your incision.   Follow up with Dr. Kathyrn Sheriff in 2 weeks after discharge. If you do not already have a discharge appointment, please call his office at [phone] to schedule a follow up appointment. If you have any concerns or questions, please call the office and let us know.

## 2019-02-16 NOTE — Progress Notes (Signed)
Patient arrived on rehab unit were she was informed about patient safety plan,patient booklet and rehab schedule .

## 2019-02-16 NOTE — Care Management Important Message (Signed)
Important Message  Patient Details  Name: Krista Hodges MRN: [id number] Date of Birth: Dec 02, 1955   Medicare Important Message Given:  Yes     Memory Argue 02/16/2019, 3:00 PM

## 2019-02-16 NOTE — Discharge Summary (Addendum)
Discharge Summary  Date of Admission: 02/03/2019  Date of Discharge: 02/16/19  Attending Physician: Consuella Lose, MD  Hospital Course: Patient presented with worsening gait instability and a fall, prompting her to come to the ED, revealing a posterior fossa tumor. CT CAP showed breast masses, right lung mass, bilateral adrenal, and possible hepatic mass. 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 She was taken to the OR on 02/04/19 for suboccipital craniotomy and tumor resection.  She was also diagnosed with T12 and L3 compression fractures, presumably from her fall, which were treated with a TLSO brace. Post-op MRI showed good resection of her cerebellar met with resolution of ventriculomegaly. She had some tachy/brady episodes, cardiology was consulted and recommended no intervention. She continued to work with PT/OT and was recommended to go to CIR. She was discharged to CIR on 02/16/2019. She was on dexamethasone while inpatient, which was decreased to 2bid. She can continue taking 2bid until follow up, as she will likely need SRS for her supratentorial metastases.  Neurologic exam at discharge:  AOx3, PERRL, EOMI, FS, TM Strength 5/5 x4, SILTx4, mild dysmetria  Discharge diagnosis: Cerebellar metastasis, hydrocephalus  Discharge Rx  Allergies as of 02/16/2019      Reactions   Penicillins Hives   Whelts. Has tolerated cephazolin and cephalexin. 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   buPROPion 100 MG tablet Commonly known as: WELLBUTRIN Take 100 mg by mouth daily.   busPIRone 15 MG tablet Commonly known as: BUSPAR Take 15 mg by mouth 2 (two) times daily.   dexamethasone 4 MG tablet Commonly known as: DECADRON Take 0.5 tablets (2 mg total) by mouth 2 (two) times daily.   donepezil 10 MG tablet Commonly known as: ARICEPT Take 10 mg by mouth at bedtime.   hydrochlorothiazide 25 MG tablet Commonly known as: HYDRODIURIL Take 25 mg by mouth daily.   irbesartan 300 MG tablet Commonly known as: AVAPRO Take 300 mg daily by mouth.   levothyroxine 75 MCG tablet Commonly known as: Synthroid Take 1 tablet (75 mcg total) by mouth daily before breakfast. What changed:   medication strength  how much to take  Another medication with the same name was removed. Continue taking this medication, and follow the directions you see here.   memantine 5 MG tablet Commonly known as: NAMENDA Take 5 mg by mouth 2 (two) times daily.   pramipexole 0.5 MG tablet  Commonly known as: MIRAPEX Take 0.5 mg by mouth at bedtime.   rosuvastatin 10 MG tablet Commonly known as: CRESTOR Take 10 mg by mouth daily.   sertraline 100 MG tablet Commonly known as: ZOLOFT Take 50 mg by mouth 2 (two) times daily.   Systane Balance 0.6 % Soln Generic drug: Propylene Glycol Place 1 drop into both eyes daily.   tiZANidine 4 MG tablet Commonly known as:  ZANAFLEX Take 4 mg by mouth as needed for muscle spasms.   traMADol-acetaminophen 37.5-325 MG tablet Commonly known as: ULTRACET Take 1 tablet by mouth as needed for moderate pain.   traZODone 50 MG tablet Commonly known as: DESYREL Take 50-100 mg by mouth at bedtime as needed for sleep.        Judith Part, MD 02/16/19 10:53 AM

## 2019-02-16 NOTE — Progress Notes (Signed)
PMR Admission Coordinator Pre-Admission Assessment   Patient: Krista Hodges is an 64 y.o., female MRN: [id number] DOB: [birth year] Height: 5\' 6"  (167.6 cm) Weight: 65 kg                                                                                                                                                  Insurance Information HMO:  yes    PPO:      PCP:      IPA:      80/20:      OTHER:  PRIMARY: [employer] Medicare      Policy#: [id number]      Subscriber: pt  CM Name: Claiborne Billings       Phone#: [phone]      Fax#: [phone] Pre-Cert#: [id number] [place] provided by Claiborne Billings for CIR admit for 7 days with updates due to 02/18/2019 to fax listed above     Employer: n/a Benefits:  Phone #: [phone]     Name:  Eff. Date: 02/13/19     Deduct: $0      Out of Pocket Max: $4500      Life Max: n/a CIR: $325/day for days 1-5      SNF: 20 full days Outpatient:      Co-Pay: $35/visit Home Health:  100%      Co-Pay: DME: 80%     Co-Pay: 20% Providers:  SECONDARY:       Policy#:       Subscriber:  CM Name:       Phone#:      Fax#:  Pre-Cert#:       Employer:  Benefits:  Phone #:      Name:  Eff. Date:      Deduct:       Out of Pocket Max:       Life Max:  CIR:       SNF:  Outpatient:      Co-Pay:  Home Health:       Co-Pay:  DME:      Co-Pay:    Medicaid Application Date:       Case Manager:  Disability Application Date:       Case Worker:    The "Data Collection Information Summary" for patients in Inpatient Rehabilitation Facilities with attached "Privacy Act [place] Records" was provided and verbally reviewed with: Patient and Family   Emergency Contact Information         Contact Information     Name Relation Home Work Mobile    [place] Spouse [phone]   [phone]    [place] Daughter     [phone]       Current Medical History  Patient Admitting Diagnosis: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 right-handed female with history  of renal cancer with left radical laparoscopic nephrectomy 05/21/2014, endometrial adenocarcinoma 2017, hypothyroidism after XRT for papillary thyroid cancer, chronic fatigue and immune dysfunction syndrome, CKD stage III, remote tobacco abuse, early dementia maintained on Aricept as well as BuSpar.  She presented on 02/03/2019 with gait instability.  Abdominal/cervical/chest CTs revealed multiple nodules diffusely suggestive of metastatic disease.  CT head personally reviewed, showing hemorrhagic mass with vasogenic edema.  She underwent suboccipital craniotomy resection of tumor on 02/04/2019 per Dr. Kathyrn Sheriff.  Placed on Keppra for seizure prophylaxis as well as Decadron protocol established.  Cardiology services consulted 02/06/2019 for tachy/brady and telemetry carefully reviewed no episodes of true tachycardia noted she did have some PVCs heart rate as low as 35 but not sustained.  No current indications for pacemaker and monitored.. Patient also found to have compression deformities at T12 and L3 as well as a lobulated mass lesion in the medial aspect of the right middle lobe with adjacent satellite nodules and mediastinal and right hilar adenopathy.  Patient was fitted with a TLSO back brace applied in sitting position.  Tolerating a regular diet.  Therapy evaluations completed with recommendations of physical medicine rehab consult. Glasgow Coma Scale Score: 15   Past Medical History      Past Medical History:  Diagnosis Date  . Allergic rhinitis    . Anxiety state    . Arthritis, degenerative    . Atypical migraine    . Awareness of heartbeats    . Cancer of left kidney (HCC)      Left Kidney  . Cervical radiculitis    . CFIDS (chronic fatigue and immune dysfunction syndrome) ([place])    . Depression, major, single episode, in partial remission ([place])    . Depression, neurotic    . Elevated serum creatinine 03/2016    history of (2.07)  .  Endometrial adenocarcinoma ([place])      Stage II grade2  . Essential (primary) hypertension    . Gastric ulcer    . Heart murmur      pt. denies  . HLD (hyperlipidemia)    . Inflammation of hand joint    . Memory difficulties    . Premature complex, ventricular    . Renal disorder    . Renal mass, left    . Restless leg    . Shortness of breath dyspnea      occasional  . Thrombophlebitis of superficial veins of lower extremity    . Tremor      L arm  . Vitamin D deficiency        Family History  family history includes Cancer in her mother; Diabetes in an other family member; Heart attack in her father.   Prior Rehab/Hospitalizations:  Has the patient had prior rehab or hospitalizations prior to admission? No   Has the patient had major surgery during 100 days prior to admission? Yes   Current Medications    Current Facility-Administered Medications:  .  0.9 %  sodium chloride infusion, , Intravenous, Continuous, Suzette Battiest, MD, Last Rate: 200 mL/hr at 02/04/19 1557, New Bag at 02/04/19 1557 .  acetaminophen (TYLENOL) tablet 650 mg, 650 mg, Oral, Q4H PRN, 650 mg at 02/12/19 0046 **OR** acetaminophen (TYLENOL) suppository 650 mg, 650 mg, Rectal, Q4H PRN, Costella, Vincent J, PA-C .  bisacodyl (DULCOLAX) suppository 10 mg, 10 mg, Rectal, Daily PRN, Costella, Vincent J, PA-C, 10 mg at 02/09/19 0956 .  buPROPion (WELLBUTRIN) tablet 200 mg, 200 mg, Oral, Daily, Costella, Evette Doffing  J, PA-C, 200 mg at 02/16/19 0909 .  busPIRone (BUSPAR) tablet 15 mg, 15 mg, Oral, BID, Costella, Vincent J, PA-C, 15 mg at 02/16/19 0909 .  dexamethasone (DECADRON) tablet 4 mg, 4 mg, Oral, BID, Judith Part, MD, 4 mg at 02/16/19 0909 .  docusate sodium (COLACE) capsule 100 mg, 100 mg, Oral, BID, Costella, Vincent J, PA-C, 100 mg at 02/16/19 0909 .  doxylamine (Sleep) (UNISOM) tablet 25 mg, 25 mg, Oral, QHS PRN, Costella, Vincent J, PA-C, 25 mg at 02/04/19 [id number] .  heparin injection 5,000 Units,  5,000 Units, Subcutaneous, Q8H, Judith Part, MD, 5,000 Units at 02/16/19 [id number] .  hydrALAZINE (APRESOLINE) injection 10 mg, 10 mg, Intravenous, Q4H PRN, Rise Patience, MD .  HYDROcodone-acetaminophen (NORCO/VICODIN) 5-325 MG per tablet 1 tablet, 1 tablet, Oral, Q4H PRN, Costella, Vista Mink, PA-C, 1 tablet at 02/16/19 0910 .  HYDROmorphone (DILAUDID) injection 0.5-1 mg, 0.5-1 mg, Intravenous, Q2H PRN, Costella, Vincent J, PA-C, 1 mg at 02/10/19 1144 .  irbesartan (AVAPRO) tablet 300 mg, 300 mg, Oral, Daily, Costella, Vincent J, PA-C, 300 mg at 02/16/19 0910 .  labetalol (NORMODYNE) injection 10-40 mg, 10-40 mg, Intravenous, Q10 min PRN, Costella, Vista Mink, PA-C .  levothyroxine (SYNTHROID) tablet 75 mcg, 75 mcg, Oral, Q0600, Mariel Aloe, MD, 75 mcg at 02/16/19 0618 .  naloxone [facility]) injection 0.08 mg, 0.08 mg, Intravenous, PRN, Costella, Vincent J, PA-C .  ondansetron (ZOFRAN) tablet 4 mg, 4 mg, Oral, Q4H PRN **OR** ondansetron (ZOFRAN) injection 4 mg, 4 mg, Intravenous, Q4H PRN, Costella, Vincent J, PA-C .  oxyCODONE (Oxy IR/ROXICODONE) immediate release tablet 5 mg, 5 mg, Oral, Q4H PRN, Costella, Vincent J, PA-C, 5 mg at 02/11/19 0019 .  pantoprazole (PROTONIX) EC tablet 40 mg, 40 mg, Oral, Q2200, Alvira Philips, [place], 40 mg at 02/15/19 2136 .  polyethylene glycol (MIRALAX / GLYCOLAX) packet 17 g, 17 g, Oral, Daily PRN, Costella, Vincent J, PA-C, 17 g at 02/09/19 0539 .  pramipexole (MIRAPEX) tablet 0.5 mg, 0.5 mg, Oral, QHS, Costella, Vincent J, PA-C, 0.5 mg at 02/15/19 2135 .  promethazine (PHENERGAN) tablet 12.5-25 mg, 12.5-25 mg, Oral, Q4H PRN, Costella, Vincent J, PA-C .  rosuvastatin (CRESTOR) tablet 10 mg, 10 mg, Oral, QHS, Costella, Vincent J, PA-C, 10 mg at 02/15/19 2135 .  senna (SENOKOT) tablet 8.6 mg, 1 tablet, Oral, BID, Costella, Vincent J, PA-C, 8.6 mg at 02/16/19 0909 .  sertraline (ZOLOFT) tablet 100 mg, 100 mg, Oral, Daily, Costella, Vincent J, PA-C, 100 mg  at 02/16/19 0910 .  sodium phosphate (FLEET) 7-19 GM/118ML enema 1 enema, 1 enema, Rectal, Once PRN, Costella, Vista Mink, PA-C .  traZODone (DESYREL) tablet 100 mg, 100 mg, Oral, QHS PRN, Costella, Vincent Lenna Sciara, PA-C, 100 mg at 02/07/19 2155   Patients Current Diet:     Diet Order                      Diet Heart Room service appropriate? Yes with Assist; Fluid consistency: Thin  Diet effective now                   Precautions / Restrictions Precautions Precautions: Fall, Back Precaution Booklet Issued: Yes (comment) Precaution Comments: diplopia Spinal Brace: Thoracolumbosacral orthotic, Applied in sitting position Restrictions Weight Bearing Restrictions: No    Has the patient had 2 or more falls or a fall with injury in the past year?Yes   Prior Activity Level Limited Community (1-2x/wk): decline in the  last 2 weeks, requiring more assist for ADLs   Prior Functional Level Prior Function Level of Independence: Needs assistance Gait / Transfers Assistance Needed: was walking with assist grossly 20' then unable to walk for 2 weeks PTA ADL's / Homemaking Assistance Needed: bathing and dressing on her own until 2 weeks PtA and since that time assist for all   Self Care: Did the patient need help bathing, dressing, using the toilet or eating?  Needed some help   Indoor Mobility: Did the patient need assistance with walking from room to room (with or without device)? Needed some help   Stairs: Did the patient need assistance with internal or external stairs (with or without device)? Needed some help   Functional Cognition: Did the patient need help planning regular tasks such as shopping or remembering to take medications? Needed some help   Home Assistive Devices / Equipment Home Equipment: Wheelchair - manual, Environmental consultant - 2 wheels, [place] - single point, Grab bars - tub/shower   Prior Device Use: Indicate devices/aids used by the patient prior to current illness, exacerbation or  injury? Walker   Current Functional Level Cognition   Overall Cognitive Status: (Nt formally, pt was participative and followed commands) Current Attention Level: Selective Orientation Level: Oriented to person, Oriented to time, Disoriented to place, Disoriented to situation Following Commands: Follows one step commands with increased time, Follows one step commands consistently Safety/Judgement: Decreased awareness of deficits General Comments: max directional verbal and tactile cues to complete task and for safety    Extremity Assessment (includes Sensation/Coordination)   Upper Extremity Assessment: Generalized weakness  Lower Extremity Assessment: Generalized weakness     ADLs   Overall ADL's : Needs assistance/impaired Grooming: Wash/dry hands, Moderate assistance, Standing Grooming Details (indicate cue type and reason): cues to avoid leaning over at sink level onto elbow Upper Body Bathing: Minimal assistance Lower Body Bathing: Moderate assistance, Sit to/from stand Upper Body Dressing : Sitting, Maximal assistance Upper Body Dressing Details (indicate cue type and reason): assistance to don brace Toilet Transfer: Minimal assistance, Cueing for safety, Cueing for sequencing, Ambulation, RW Toilet Transfer Details (indicate cue type and reason): simulated from eob taking approx. 10 steps to recliner (very small short steps), cues for rw management, asks "did we make it tell me when i can sit" Toileting- Clothing Manipulation and Hygiene: Moderate assistance, Sit to/from stand Toileting - Clothing Manipulation Details (indicate cue type and reason): pt able to power up and attmepting to wipe with flexed posture with cues that squating is required Functional mobility during ADLs: Minimal assistance, Rolling walker General ADL Comments: able to complete bed mobility, and simulated toilet transfer in room with eob and ambulation to recliner.     Mobility   Overal bed mobility:  Needs Assistance Bed Mobility: Rolling, Sidelying to Sit Rolling: Min assist Sidelying to sit: Mod assist Sit to sidelying: Mod assist, HOB elevated General bed mobility comments: practiced building momentum during roll and transition to sitting EOB     Transfers   Overall transfer level: Needs assistance Equipment used: Rolling walker (2 wheeled) Transfers: Sit to/from Stand Sit to Stand: Mod assist, Max assist Stand pivot transfers: Mod assist General transfer comment: Initially, pt needed extra assist to list forward with transition of weight from heels to forefeet.  It took several minutes to get pt to shift forward.     Ambulation / Gait / Stairs / Wheelchair Mobility   Ambulation/Gait Ambulation/Gait assistance: Mod assist Gait Distance (Feet): 25 Feet Assistive device:  Rolling walker (2 wheeled) Gait Pattern/deviations: Step-to pattern, Step-through pattern General Gait Details: short, tentative and mildly retropulsive in nature.  Consistent cues to stay closer into the RW. Gait velocity: slow Gait velocity interpretation: <1.31 ft/sec, indicative of household ambulator     Posture / Balance Dynamic Sitting Balance Sitting balance - Comments: but min guard only,  tending to list posteriorly Balance Overall balance assessment: Needs assistance Sitting-balance support: Single extremity supported, No upper extremity supported Sitting balance-Leahy Scale: Fair Sitting balance - Comments: but min guard only,  tending to list posteriorly Standing balance support: Bilateral upper extremity supported Standing balance-Leahy Scale: Poor Standing balance comment: required AD and external support     Special needs/care consideration BiPAP/CPAP no CPM no Continuous Drip IV no Dialysis no        Days n/a Life Vest no Oxygen no Special Bed no Trach Size no Wound Vac (area) no      Location n/a Skin                             Location Bowel mgmt: Bladder mgmt: Diabetic mgmt  no Behavioral consideration early dementia, some sundowing Chemo/radiation         Previous Home Environment (from acute therapy documentation) Living Arrangements: Spouse/significant other Available Help at Discharge: Family, Available 24 hours/day Type of Home: Apartment Home Layout: One level Home Access: Level entry Bathroom Shower/Tub: Multimedia programmer: Standard Additional Comments: daughter in law present and will be able to help at home. She thinks they have a 3n1 at home but uncertain   Discharge Living Setting Plans for Discharge Living Setting: Patient's home Type of Home at Discharge: House(in law suite) Discharge Home Layout: One level Discharge Home Access: Level entry Discharge Bathroom Shower/Tub: Walk-in shower Discharge Bathroom Toilet: Standard Discharge [place] Accessibility: Yes How Accessible: Accessible via wheelchair Does the patient have any problems obtaining your medications?: No   Social/Family/Support Systems Anticipated Caregiver: spouse Marcello Moores) and hired caregivers during the day, if needed; children [name], [place]) work and available outside work hours Anticipated Foreman: [phone] Marcello Moores), [phone] [name]) Ability/Limitations of Caregiver: supervision for [employer] Caregiver Availability: 24/7 Discharge Plan Discussed with Primary Caregiver: Yes Is Caregiver In Agreement with Plan?: Yes Does Caregiver/Family have Issues with Lodging/Transportation while Pt is in Rehab?: No     Goals/Additional Needs Patient/Family Goal for Rehab: PT/OT/SLP supervision Expected length of stay: 17-20 days Additional Information: sundowning Pt/Family Agrees to Admission and willing to participate: Yes Program Orientation Provided & Reviewed with Pt/Caregiver Including Roles  & Responsibilities: Yes     Decrease burden of Care through IP rehab admission: n/a     Possible need for SNF placement upon discharge: not  anticipated     Patient Condition: This patient's medical and functional status has changed since the consult dated: 02/12/2019 in which the Rehabilitation Physician determined and documented that the patient's condition is appropriate for intensive rehabilitative care in an inpatient rehabilitation facility. See "History of Present Illness" (above) for medical update. Functional changes are: pt min assist. Patient's medical and functional status update has been discussed with the Rehabilitation physician and patient remains appropriate for inpatient rehabilitation. Will admit to inpatient rehab today.   Preadmission Screen Completed By:  Michel Santee, PT, 02/16/2019 1:51 PM ______________________________________________________________________   Discussed status with Dr. Naaman Plummer on 02/16/19 at 1:58 PM  and received approval for admission today.   Admission Coordinator:  Michel Santee, PT,  DPT time 1:58 PM Sudie Grumbling 02/16/19          Cosigned by: Meredith Staggers, MD at 02/16/2019  2:19 PM

## 2019-02-17 ENCOUNTER — Inpatient Hospital Stay (HOSPITAL_COMMUNITY): Payer: Medicare Other | Admitting: Speech Pathology

## 2019-02-17 ENCOUNTER — Inpatient Hospital Stay (HOSPITAL_COMMUNITY): Payer: Medicare Other | Admitting: Physical Therapy

## 2019-02-17 ENCOUNTER — Inpatient Hospital Stay (HOSPITAL_COMMUNITY): Payer: Medicare Other

## 2019-02-17 ENCOUNTER — Inpatient Hospital Stay (HOSPITAL_COMMUNITY): Payer: Medicare Other | Admitting: Occupational Therapy

## 2019-02-17 DIAGNOSIS — M7989 Other specified soft tissue disorders: Secondary | ICD-10-CM

## 2019-02-17 LAB — COMPREHENSIVE METABOLIC PANEL
ALT: 17 U/L (ref 0–44)
AST: 11 U/L — ABNORMAL LOW (ref 15–41)
Albumin: 3.1 g/dL — ABNORMAL LOW (ref 3.5–5.0)
Alkaline Phosphatase: 120 U/L (ref 38–126)
Anion gap: 8 (ref 5–15)
BUN: 15 mg/dL (ref 8–23)
CO2: 26 mmol/L (ref 22–32)
Calcium: 9.9 mg/dL (ref 8.9–10.3)
Chloride: 104 mmol/L (ref 98–111)
Creatinine, Ser: 1.08 mg/dL — ABNORMAL HIGH (ref 0.44–1.00)
GFR calc Af Amer: >60 mL/min (ref >60)
GFR calc non Af Amer: 54 mL/min — ABNORMAL LOW (ref >60)
Glucose, Bld: 92 mg/dL (ref 70–99)
Potassium: 3.4 mmol/L — ABNORMAL LOW (ref 3.5–5.1)
Sodium: 138 mmol/L (ref 135–145)
Total Bilirubin: 0.6 mg/dL (ref 0.3–1.2)
Total Protein: 6.2 g/dL — ABNORMAL LOW (ref 6.5–8.1)

## 2019-02-17 LAB — CBC WITH DIFFERENTIAL/PLATELET
Abs Immature Granulocytes: 0.11 10*3/uL — ABNORMAL HIGH (ref 0.00–0.07)
Basophils Absolute: 0 10*3/uL (ref 0.0–0.1)
Basophils Relative: 0 %
Eosinophils Absolute: 0 10*3/uL (ref 0.0–0.5)
Eosinophils Relative: 0 %
HCT: 33.2 % — ABNORMAL LOW (ref 36.0–46.0)
Hemoglobin: 10.8 g/dL — ABNORMAL LOW (ref 12.0–15.0)
Immature Granulocytes: 1 %
Lymphocytes Relative: 3 %
Lymphs Abs: 0.4 10*3/uL — ABNORMAL LOW (ref 0.7–4.0)
MCH: 31.5 pg (ref 26.0–34.0)
MCHC: 32.5 g/dL (ref 30.0–36.0)
MCV: 96.8 fL (ref 80.0–100.0)
Monocytes Absolute: 0.8 10*3/uL (ref 0.1–1.0)
Monocytes Relative: 6 %
Neutro Abs: 12.5 10*3/uL — ABNORMAL HIGH (ref 1.7–7.7)
Neutrophils Relative %: 90 %
Platelets: 213 10*3/uL (ref 150–400)
RBC: 3.43 MIL/uL — ABNORMAL LOW (ref 3.87–5.11)
RDW: 14.5 % (ref 11.5–15.5)
WBC: 13.8 10*3/uL — ABNORMAL HIGH (ref 4.0–10.5)
nRBC: 0 % (ref 0.0–0.2)

## 2019-02-17 MED ORDER — POTASSIUM CHLORIDE CRYS ER 20 MEQ PO TBCR
30.0000 meq | EXTENDED_RELEASE_TABLET | Freq: Once | ORAL | Status: AC
  Administered 2019-02-17: 30 meq via ORAL
  Filled 2019-02-17: qty 1

## 2019-02-17 NOTE — Evaluation (Signed)
Occupational Therapy Assessment and Plan  Patient Details  Name: Krista Hodges MRN: [id number] Date of Birth: 1955-10-01  OT Diagnosis: abnormal posture, cognitive deficits, muscle weakness (generalized) and coordination disorder Rehab Potential: Rehab Potential (ACUTE ONLY): Good ELOS: 10-14 days   Today's Date: 02/17/2019 OT Individual Time: 6384-6659 OT Individual Time Calculation (min): 57 min     Problem List:  Patient Active Problem List   Diagnosis Date Noted  . Cerebellar tumor ([place]) 02/16/2019  . History of renal cell cancer   . Status post nephrectomy   . Endometrial adenocarcinoma ([place])   . Chronic fatigue   . Stage 3 chronic kidney disease   . Leucocytosis   . Dementia ([place]) 02/11/2019  . Brain tumor ([place]) 02/03/2019  . Hypokalemia 02/03/2019  . Hypothyroidism 02/03/2019  . Papillary thyroid carcinoma ([place]) 05/05/2017  . Tremor 01/31/2017  . Left renal mass 05/21/2014  . Idiopathic acute pancreatitis   . Urinary tract infectious disease   . Pancreatic mass   . Other acute pancreatitis   . Left kidney mass   . Acute pancreatitis 04/03/2014  . PVC (premature ventricular contraction) 10/20/2013  . Dyspnea 10/20/2013  . Allergic rhinitis 10/16/2013  . Anxiety state 10/16/2013  . Enthesopathy of hip 10/16/2013  . Cervical radiculitis 10/16/2013  . 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 CFIDS (chronic fatigue and immune dysfunction syndrome) ([place]) 10/16/2013  . Inflammation of hand joint 10/16/2013  . Atypical migraine 10/16/2013  . Excessive sweating, local 10/16/2013  . HLD (hyperlipidemia) 10/16/2013  . Depression, major, single episode, in partial remission ([place]) 10/16/2013  . Amnesia 10/16/2013  . Adiposity 10/16/2013  . Arthritis, degenerative 10/16/2013  . Restless leg 10/16/2013  . Adaptation reaction 10/16/2013  . Thrombophlebitis of superficial veins of lower extremity 10/16/2013  . Essential (primary) hypertension 10/16/2013  . Avitaminosis D  10/16/2013  . Awareness of heartbeats 09/16/2013  . Premature complex, ventricular 09/16/2013    Past Medical History:  Past Medical History:  Diagnosis Date  . Allergic rhinitis   . Anxiety state   . Arthritis, degenerative   . Atypical migraine   . Awareness of heartbeats   . Cancer of left kidney (HCC)    Left Kidney  . Cervical radiculitis   . CFIDS (chronic fatigue and immune dysfunction syndrome) ([place])   . Depression, major, single episode, in partial remission ([place])   . Depression, neurotic   . Elevated serum creatinine 03/2016   history of (2.07)  . Endometrial adenocarcinoma ([place])    Stage II grade2  . Essential (primary) hypertension   . Gastric ulcer   . Heart murmur    pt. denies  . HLD (hyperlipidemia)   . Inflammation of hand joint   . Memory difficulties   . Premature complex, ventricular   . Renal disorder   . Renal mass, left   . Restless leg   . Shortness of breath dyspnea    occasional  . 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 Tremor    L arm  . Vitamin D deficiency    Past Surgical History:  Past Surgical History:  Procedure Laterality Date  . c sections x 3     . CARPAL TUNNEL RELEASE     rt hand  . CRANIOTOMY N/A 02/04/2019   Procedure: Suboccipital CRANIECTOMY for  TUMOR EXCISION;  Surgeon: Consuella Lose, MD;  Location: [facility];  Service: Neurosurgery;  Laterality: N/A;  . LAPAROSCOPIC NEPHRECTOMY Left 05/21/2014   Procedure: LEFT RADICAL LAPAROSCOPIC NEPHRECTOMY;  Surgeon: Ardis Hughs, MD;  Location: [facility];  Service: Urology;  Laterality: Left;  Marland Kitchen MANDIBLE SURGERY    . NEPHRECTOMY TRANSPLANTED ORGAN     Left kidney removed  . ROBOTIC ASSISTED TOTAL HYSTERECTOMY WITH BILATERAL SALPINGO OOPHERECTOMY  12/27/2015  . THYROIDECTOMY N/A 05/09/2017   Procedure: TOTAL THYROIDECTOMY WITH LIMITED CENTRAL COMPARTMENT LYMPH NODE DISSECTION;  Surgeon: Armandina Gemma, MD;  Location: [facility];  Service: General;  Laterality:  N/A;  . TOTAL THYROIDECTOMY     05-09-17 Dr. Harlow Asa    Assessment & Plan Clinical Impression: Patient is a 64 y.o. year old female with history of renal cancer with left radical laparoscopic nephrectomy 05/21/2014, endometrial adenocarcinoma 2017, hypothyroidism after XRT for papillary thyroid cancer, chronic fatigue and immune dysfunction syndrome, , remote tobacco use, early dementia maintained on Aricept as well as BuSpar. History taken from chart review due to cognition and son taken from patient. Patient states she lives with spouse 1 level home. They have a daughter-in-law who can assist as needed. Patient ambulating with assistance prior to admission. Presented 02/03/2019 with gait instability. Abdominal cervical chest CTs revealed multiple nodules diffusely suggestive of metastatic disease. CT of the head reviewed showing hemorrhagic mass with vasogenic edema. She underwent suboccipital craniotomy resection of tumor on 02/04/2019 per Dr. Kathyrn Sheriff. Placed on Keppra for seizure prophylaxis as well as Decadron protocol established. Subcutaneous heparin for DVT prophylaxis 02/12/2019. Cardiology services consulted 02/06/2019 for tachybradycardia syndrome and telemetry carefully reviewed showing no episodes of true tachycardia noted she did have some PVCs heart rate as low as 35 but not sustained. No current indications for pacemaker and monitored. Patient also found to have compression fractures at T12 and L3 as well as lobulated mass lesion in the medial aspect of the middle right lobe with adjacent satellite nodules and mediastinal and right hilar adenopathy. She was fitted with a TLSO back brace applied in sitting position. Tolerating a regular diet. Therapy evaluations completed and patient was admitted for a comprehensive rehab program. .  Patient transferred to CIR on 02/16/2019 .    Patient currently requires max with basic self-care skills and IADL secondary to muscle weakness,  decreased cardiorespiratoy endurance, decreased coordination and decreased motor planning, decreased strength, decreased awareness, decreased problem solving, decreased safety awareness and decreased memory and decreased sitting balance, decreased standing balance and decreased balance strategies.  Prior to hospitalization, patient could complete ADLs and IADLs with modified independent .  Patient will benefit from skilled intervention to decrease level of assist with basic self-care skills prior to discharge home with care partner.  Anticipate patient will require 24 hour supervision and minimal physical assistance and follow up home health.  OT - End of Session Activity Tolerance: Decreased this session Endurance Deficit: Yes Endurance Deficit Description: Required frequent rest breaks between activity OT Assessment Rehab Potential (ACUTE ONLY): Good OT Barriers to Discharge: Other (comments) OT Barriers to Discharge Comments: none known at this time OT Patient demonstrates impairments in the following area(s): Balance;Behavior;Endurance;Pain;Safety;Cognition;Motor;Vision;Sensory OT Basic ADL's Functional Problem(s): Grooming;Bathing;Dressing;Toileting OT Transfers Functional Problem(s): Toilet;Tub/Shower OT Additional Impairment(s): None OT Plan OT Intensity: Minimum of 1-2 x/day, 45 to 90 minutes OT Frequency: 5 out of 7 days OT Treatment/Interventions: Balance/vestibular training;DME/adaptive equipment instruction;Patient/family education;Therapeutic Activities;Functional electrical stimulation;Psychosocial support;Therapeutic Exercise;Wheelchair propulsion/positioning;Community reintegration;Functional mobility training;Self Care/advanced ADL retraining;Discharge planning;Neuromuscular re-education;UE/LE Coordination activities;Pain management;Disease mangement/prevention;UE/LE Strength taining/ROM;Cognitive remediation/compensation OT Self Feeding Anticipated Outcome(s): n/a OT Basic  Self-Care Anticipated Outcome(s): S - CGA OT Toileting Anticipated Outcome(s): CGA OT Bathroom Transfers Anticipated Outcome(s):  CGA OT Recommendation Recommendations for Other Services: Neuropsych consult Patient destination: Home Follow Up Recommendations: Home health OT;24 hour supervision/assistance Equipment Recommended: To be determined   Skilled Therapeutic Intervention Upon entering the room, pt seated in wheelchair with caregiver present in the room. Pt reporting 7/10 pain in back this session with RN notified and medication given this session. OT providing education to caregiver and pt on importance of pain medication during recovery and participation with therapeutic interventions. Pt declined bathing and dressing tasks until clothing is available. OT educated caregiver present on items needed for next session. Pt standing with mod A and ambulating with RW and min A into bathroom for toileting needs. Pt needing mod A for toilet transfer but max A for clothing management, balance, and hygiene. Pt needing max cuing for sequencing and hand placement for mobility with RW and to stay inside of walker. Pt with posterior bias initially. Pt standing at sink for hand hygiene and brush teeth for 3 minutes. Pt taking seated rest break secondary to fatigue and ambulating 40' into hallway with min - mod A. Pt remained seated in wheelchair at end of session with call bell and all needed items within reach. OT educated pt and caregiver on OT purpose, POC, and goals with them verbalizing understanding and agreement.   OT Evaluation Precautions/Restrictions  Precautions Precautions: Back Required Braces or Orthoses: Spinal Brace Spinal Brace: Thoracolumbosacral orthotic Restrictions Weight Bearing Restrictions: No Vital Signs Therapy Vitals Pulse Rate: (!) 58 Resp: 14 BP: 130/70 Patient Position (if appropriate): Sitting Oxygen Therapy SpO2: 99 % O2 Device: Room Air Pain Pain  Assessment Pain Scale: 0-10 Pain Score: 7  Pain Type: Acute pain Pain Location: Back Pain Descriptors / Indicators: Aching;Discomfort Pain Onset: On-going Patients Stated Pain Goal: 2 Pain Intervention(s): Medication (See eMAR);RN made aware;Repositioned;Cold applied Multiple Pain Sites: No Home Living/Prior Functioning Home Living Living Arrangements: Spouse/significant other Available Help at Discharge: Family, Available 24 hours/day Type of Home: Apartment Home Access: Level entry Home Layout: One level Bathroom Shower/Tub: Multimedia programmer: Standard Additional Comments: Reports her husband and other family can assist 24/7  Lives With: Spouse Prior Function Level of Independence: Independent with transfers, Independent with basic ADLs, Independent with homemaking with ambulation, Independent with gait Driving: No Vocation: Retired 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, building services Baseline Vision/History: Wears glasses Wears Glasses: Reading only Patient Visual Report: No change from baseline Vision Assessment?: Yes Eye Alignment: Within Functional Limits Ocular Range of Motion: Within Functional Limits Alignment/Gaze Preference: Within Defined Limits Tracking/Visual Pursuits: Decreased smoothness of eye movement to LEFT superior field;Decreased smoothness of eye movement to LEFT inferior field;Decreased smoothness of eye movement to RIGHT inferior field;Decreased smoothness of eye movement to RIGHT superior field;Decreased smoothness of horizontal tracking;Decreased smoothness of vertical tracking Saccades: Undershoots Convergence: Within functional limits Perception  Perception: Within Functional Limits Praxis Praxis: Intact Cognition Overall Cognitive Status: History of cognitive impairments - at baseline Arousal/Alertness: Awake/alert Orientation Level: Person;Place;Situation Person: Oriented Place: Oriented Situation: Oriented Year: 2020 Month:  February Day of Week: Correct Memory: Impaired Memory Impairment: Storage deficit;Retrieval deficit;Decreased short term memory;Decreased long term memory Decreased Long Term Memory: Verbal basic;Functional basic Decreased Short Term Memory: Verbal basic;Functional basic Immediate Memory Recall: Sock;Blue;Bed Memory Recall Sock: Not able to recall Memory Recall Blue: With Cue Memory Recall Bed: Not able to recall Attention: Selective Selective Attention: Appears intact Awareness: Appears intact Problem Solving: Impaired Problem Solving Impairment: Verbal complex;Functional complex Executive Function: Organizing;Reasoning Reasoning: Appears intact Organizing: Impaired Organizing Impairment: Verbal  complex Safety/Judgment: Appears intact Sensation Sensation Light Touch: Appears Intact Proprioception: Impaired Detail Proprioception Impaired Details: Impaired RLE Coordination Gross Motor Movements are Fluid and Coordinated: No Coordination and Movement Description: decreased activity tolerance and midline orientation with functional mobilty Motor  Motor Motor: Abnormal postural alignment and control Motor - Skilled Clinical Observations: Strong posterior lean in standing Mobility  Bed Mobility Bed Mobility: Rolling Right;Rolling Left;Supine to Sit;Sit to Supine Rolling Right: Supervision/verbal cueing Rolling Left: Supervision/Verbal cueing Supine to Sit: Minimal Assistance - Patient > 75% Sit to Supine: Minimal Assistance - Patient > 75% Transfers Sit to Stand: Moderate Assistance - Patient 50-74% Stand to Sit: Moderate Assistance - Patient 50-74%  Trunk/Postural Assessment  Cervical Assessment Cervical Assessment: Exceptions to WFL(forward head) Thoracic Assessment Thoracic Assessment: Exceptions to WFL(kyphosis) Lumbar Assessment Lumbar Assessment: Exceptions to WFL(posterior pelvic tilt) Postural Control Postural Control: Deficits on evaluation   Balance Balance Balance Assessed: Yes Static Sitting Balance Static Sitting - Balance Support: Right upper extremity supported;Left upper extremity supported;Feet supported Static Sitting - Level of Assistance: 5: Stand by assistance Dynamic Sitting Balance Dynamic Sitting - Balance Support: Right upper extremity supported;Left upper extremity supported;Feet supported Dynamic Sitting - Level of Assistance: 5: Stand by assistance Dynamic Sitting - Balance Activities: Lateral lean/weight shifting;Forward lean/weight shifting;Reaching for objects Static Standing Balance Static Standing - Balance Support: Bilateral upper extremity supported;During functional activity Static Standing - Level of Assistance: 3: Mod assist Dynamic Standing Balance Dynamic Standing - Balance Support: Bilateral upper extremity supported;During functional activity Dynamic Standing - Level of Assistance: 3: Mod assist Extremity/Trunk Assessment RUE Assessment RUE Assessment: Within Functional Limits LUE Assessment LUE Assessment: Within Functional Limits     Refer to Care Plan for Long Term Goals  Recommendations for other services: Neuropsych   Discharge Criteria: Patient will be discharged from OT if patient refuses treatment 3 consecutive times without medical reason, if treatment goals not met, if there is a change in medical status, if patient makes no progress towards goals or if patient is discharged from hospital.  The above assessment, treatment plan, treatment alternatives and goals were discussed and mutually agreed upon: by patient and by family  Gypsy Decant 02/17/2019, 2:47 PM

## 2019-02-17 NOTE — Care Management (Signed)
Inpatient [facility] Individual Statement of Services  Patient Name:  Krista Hodges  Date:  02/17/2019  Welcome to the [facility].  Our goal is to provide you with an individualized program based on your diagnosis and situation, designed to meet your specific needs.  With this comprehensive rehabilitation program, you will be expected to participate in at least 3 hours of rehabilitation therapies Monday-Friday, with modified therapy programming on the weekends.  Your rehabilitation program will include the following services:  Physical Therapy (PT), Occupational Therapy (OT), Speech Therapy (ST), 24 hour per day rehabilitation nursing, Neuropsychology, Case Management (Social Worker), Rehabilitation Medicine, Nutrition Services and Pharmacy Services  Weekly team conferences will be held on Wednesdays to discuss your progress.  Your Social Worker will talk with you frequently to get your input and to update you on team discussions.  Team conferences with you and your family in attendance may also be held.  Expected length of stay: 14 days Overall anticipated outcome: Minimal assist-Supervision goals  Depending on your progress and recovery, your program may change. Your Social Worker will coordinate services and will keep you informed of any changes. Your Social Worker's name and contact numbers are listed  below.  The following services may also be recommended but are not provided by the [facility]:    [place] will be made to provide these services after discharge if needed.  Arrangements include referral to agencies that provide these services.  Your insurance has been verified to be:  [facility] Medicare Your primary doctor is:  Dr. Herbie Baltimore Eugene Garnet) Inda Merlin  Pertinent information will be shared with your doctor and your insurance company.  Social Worker:  [place], [place] or ([phone]   Information discussed with and copy given to patient by: Margarito Liner, 02/17/2019, 2:18 PM

## 2019-02-17 NOTE — Progress Notes (Signed)
Bilateral lower extremity venous duplex complete.  Please see CV proc tab for preliminary results. [place], RVT 4:55 PM  02/17/2019

## 2019-02-17 NOTE — Evaluation (Signed)
Speech Language Pathology Assessment and Plan  Patient Details  Name: Krista Hodges MRN: [id number] Date of Birth: 1955/10/08  SLP Diagnosis: Other (comment)(n/a - no ST indicated)  Rehab Potential: Good ELOS: n/a for ST    Today's Date: 02/17/2019 SLP Individual Time: 3149-7026 SLP Individual Time Calculation (min): 50 min   Problem List:  Patient Active Problem List   Diagnosis Date Noted  . Cerebellar tumor ([place]) 02/16/2019  . History of renal cell cancer   . Status post nephrectomy   . Endometrial adenocarcinoma ([place])   . Chronic fatigue   . Stage 3 chronic kidney disease   . Leucocytosis   . Dementia ([place]) 02/11/2019  . Brain tumor ([place]) 02/03/2019  . Hypokalemia 02/03/2019  . Hypothyroidism 02/03/2019  . Papillary thyroid carcinoma ([place]) 05/05/2017  . Tremor 01/31/2017  . Left renal mass 05/21/2014  . Idiopathic acute pancreatitis   . Urinary tract infectious disease   . Pancreatic mass   . Other acute pancreatitis   . Left kidney mass   . Acute pancreatitis 04/03/2014  . PVC (premature ventricular contraction) 10/20/2013  . Dyspnea 10/20/2013  . Allergic rhinitis 10/16/2013  . Anxiety state 10/16/2013  . Enthesopathy of hip 10/16/2013  . Cervical radiculitis 10/16/2013  . 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 CFIDS (chronic fatigue and immune dysfunction syndrome) ([place]) 10/16/2013  . Inflammation of hand joint 10/16/2013  . Atypical migraine 10/16/2013  . Excessive sweating, local 10/16/2013  . HLD (hyperlipidemia) 10/16/2013  . Depression, major, single episode, in partial remission ([place]) 10/16/2013  . Amnesia 10/16/2013  . Adiposity 10/16/2013  . Arthritis, degenerative 10/16/2013  . Restless leg 10/16/2013  . Adaptation reaction 10/16/2013  . Thrombophlebitis of superficial veins of lower extremity 10/16/2013  . Essential (primary) hypertension 10/16/2013  . Avitaminosis D 10/16/2013  . Awareness of heartbeats 09/16/2013  . Premature complex,  ventricular 09/16/2013   Past Medical History:  Past Medical History:  Diagnosis Date  . Allergic rhinitis   . Anxiety state   . Arthritis, degenerative   . Atypical migraine   . Awareness of heartbeats   . Cancer of left kidney (H[facility])    Left Kidney  . Cervical radiculitis   . CFIDS (chronic fatigue and immune dysfunction syndrome) ([place])   . Depression, major, single episode, in partial remission ([place])   . Depression, neurotic   . Elevated serum creatinine 03/2016   history of (2.07)  . Endometrial adenocarcinoma ([place])    Stage II grade2  . Essential (primary) hypertension   . Gastric ulcer   . Heart murmur    pt. denies  . HLD (hyperlipidemia)   . Inflammation of hand joint   . Memory difficulties   . Premature complex, ventricular   . Renal disorder   . Renal mass, left   . Restless leg   . Shortness of breath dyspnea    occasional  . 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 Tremor    L arm  . Vitamin D deficiency    Past Surgical History:  Past Surgical History:  Procedure Laterality Date  . c sections x 3     . CARPAL TUNNEL RELEASE     rt hand  . CRANIOTOMY N/A 02/04/2019   Procedure: Suboccipital CRANIECTOMY for  TUMOR EXCISION;  Surgeon: Consuella Lose, MD;  Location: [facility];  Service: Neurosurgery;  Laterality: N/A;  . LAPAROSCOPIC NEPHRECTOMY Left 05/21/2014   Procedure: LEFT RADICAL LAPAROSCOPIC NEPHRECTOMY;  Surgeon: Ardis Hughs, MD;  Location: [facility];  Service: Urology;  Laterality: Left;  Marland Kitchen MANDIBLE SURGERY    . NEPHRECTOMY TRANSPLANTED ORGAN     Left kidney removed  . ROBOTIC ASSISTED TOTAL HYSTERECTOMY WITH BILATERAL SALPINGO OOPHERECTOMY  12/27/2015  . THYROIDECTOMY N/A 05/09/2017   Procedure: TOTAL THYROIDECTOMY WITH LIMITED CENTRAL COMPARTMENT LYMPH NODE DISSECTION;  Surgeon: Armandina Gemma, MD;  Location: [facility];  Service: General;  Laterality: N/A;  . TOTAL THYROIDECTOMY     05-09-17 Dr. Harlow Asa    Assessment / Plan  / Recommendation Clinical Impression   CZY:SAYTK L. Krista Hodges is a 64 year old right-handed female with history of renal cancer with left radical laparoscopic nephrectomy 05/21/2014, endometrial adenocarcinoma 2017, hypothyroidism after XRT for papillary thyroid cancer, chronic fatigue and immune dysfunction syndrome, , remote tobacco use, early dementia maintained on Aricept as well as BuSpar. History taken from chart review due to cognition and son taken from patient. Patient states she lives with spouse 1 level home. They have a daughter-in-law who can assist as needed. Patient ambulating with assistance prior to admission. Presented 02/03/2019 with gait instability. Abdominal cervical chest CTs revealed multiple nodules diffusely suggestive of metastatic disease. CT of the head reviewed showing hemorrhagic mass with vasogenic edema. She underwent suboccipital craniotomy resection of tumor on 02/04/2019 per Dr. Kathyrn Sheriff. Placed on Keppra for seizure prophylaxis as well as Decadron protocol established. Subcutaneous heparin for DVT prophylaxis 02/12/2019. Cardiology services consulted 02/06/2019 for tachybradycardia syndrome and telemetry carefully reviewed showing no episodes of true tachycardia noted she did have some PVCs heart rate as low as 35 but not sustained. No current indications for pacemaker and monitored. Patient also found to have compression fractures at T12 and L3 as well as lobulated mass lesion in the medial aspect of the middle right lobe with adjacent satellite nodules and mediastinal and right hilar adenopathy. She was fitted with a TLSO back brace applied in sitting position. Tolerating a regular diet. Pt was admitted to CIR 02/16/19 and SLP cognitive-linguistic evaluation was completed 02/17/19 with results as follows:  Pt is at baseline level of cognitive functioning. Per chart review, as well as pt and husband report, pt has baseline dx of dementia and therefore experienced  difficulty with short and long term memory. Pt's husband confirmed he manages pt's medications, finances, medical appointments, cooking, etc. Pt enjoys reading and watching TV, however doesn't engage in any activities that place high demand on cognitive skills without husband present. Pt scored in the severe range on subtests for short term memory and visual construction on the Cognistat, however both husband and pt her performance on these tests was perceived as improved in comparison to function prior to resection. Pt's expressive and receptive language was [facility] in conversation and during structured tasks. She was 100% intelligible and timely in her responses. Pt was disoriented to age, situation, and date, consistent with baseline dementia symptoms. Education provided regarding external aids to assist with memory at home; pt and her husband receptive and appreciative of recommendations.   Given pt is at baseline level of cognitive function and will have appropriate level of assistance for cognition upon return home with husband, no skilled ST services are indicated at this time.    Skilled Therapeutic Interventions          Cognitive-linguistic evaluation administered and results were reviewed with pt and her husband (see above for details).   SLP Assessment  Patient does not need any further Speech Lanaguage Pathology Services    Recommendations  Oral Care Recommendations: Oral  care BID Patient destination: Home Follow up Recommendations: 24 hour supervision/assistance Equipment Recommended: None recommended by SLP         SLP Duration   n/a for ST - no skilled services indicated          Pain Pain Assessment Pain Scale: 0-10 Pain Score: 0-No pain  Prior Functioning Type of Home: Apartment  Lives With: Spouse Available Help at Discharge: Family;Available 24 hours/day Vocation: Retired  Programmer, systems Overall Cognitive Status: History of cognitive impairments - at  baseline Arousal/Alertness: Awake/alert Orientation Level: Oriented to person;Oriented to place;Disoriented to time;Disoriented to situation Attention: Selective Selective Attention: Appears intact Memory: Impaired Memory Impairment: Storage deficit;Retrieval deficit;Decreased short term memory;Decreased long term memory Decreased Long Term Memory: Verbal basic;Functional basic Decreased Short Term Memory: Verbal basic;Functional basic Awareness: Appears intact Problem Solving: Impaired Problem Solving Impairment: Verbal complex Executive Function: Organizing;Reasoning Reasoning: Appears intact Organizing: Impaired Organizing Impairment: Verbal complex Safety/Judgment: Appears intact  Comprehension Auditory Comprehension Overall Auditory Comprehension: Appears within functional limits for tasks assessed Yes/No Questions: Within Functional Limits Commands: Within Functional Limits Conversation: Simple Visual Recognition/Discrimination Discrimination: Within Function Limits Reading Comprehension Reading Status: Not tested Expression Expression Primary Mode of Expression: Verbal Verbal Expression Overall Verbal Expression: Appears within functional limits for tasks assessed Initiation: No impairment Repetition: No impairment Naming: No impairment Pragmatics: No impairment Non-Verbal Means of Communication: Not applicable Written Expression Written Expression: Within Functional Limits Oral Motor Oral Motor/Sensory Function Overall Oral Motor/Sensory Function: Within functional limits Motor Speech Overall Motor Speech: Appears within functional limits for tasks assessed Respiration: Within functional limits Phonation: Normal Resonance: Within functional limits Articulation: Within functional limitis Intelligibility: Intelligible Motor Planning: Witnin functional limits Motor Speech Errors: Not applicable   Intelligibility: Intelligible      Recommendations for  other services: None   Discharge Criteria: Patient will be discharged from SLP if patient refuses treatment 3 consecutive times without medical reason, if treatment goals not met, if there is a change in medical status, if patient makes no progress towards goals or if patient is discharged from hospital.  The above assessment, treatment plan, treatment alternatives and goals were discussed and mutually agreed upon: by patient and family (husband)  Arbutus Leas 02/17/2019, 12:20 PM

## 2019-02-17 NOTE — Evaluation (Addendum)
Physical Therapy Assessment and Plan  Patient Details  Name: Krista Hodges MRN: [id number] Date of Birth: [birth year]  PT Diagnosis: Abnormal posture, Abnormality of gait, Cognitive deficits, Coordination disorder, Difficulty walking, Low back pain and Muscle weakness Rehab Potential:   ELOS: 10-14 days   Today's Date: 02/17/2019 PT Individual Time: 0800-0910 PT Individual Time Calculation (min): 70 min    Problem List:  Patient Active Problem List   Diagnosis Date Noted  . Cerebellar tumor ([place]) 02/16/2019  . History of renal cell cancer   . Status post nephrectomy   . Endometrial adenocarcinoma ([place])   . Chronic fatigue   . Stage 3 chronic kidney disease   . Leucocytosis   . Dementia ([place]) 02/11/2019  . Brain tumor ([place]) 02/03/2019  . Hypokalemia 02/03/2019  . Hypothyroidism 02/03/2019  . Papillary thyroid carcinoma ([place]) 05/05/2017  . Tremor 01/31/2017  . Left renal mass 05/21/2014  . Idiopathic acute pancreatitis   . Urinary tract infectious disease   . Pancreatic mass   . Other acute pancreatitis   . Left kidney mass   . Acute pancreatitis 04/03/2014  . PVC (premature ventricular contraction) 10/20/2013  . Dyspnea 10/20/2013  . Allergic rhinitis 10/16/2013  . Anxiety state 10/16/2013  . Enthesopathy of hip 10/16/2013  . Cervical radiculitis 10/16/2013  . 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 CFIDS (chronic fatigue and immune dysfunction syndrome) ([place]) 10/16/2013  . Inflammation of hand joint 10/16/2013  . Atypical migraine 10/16/2013  . Excessive sweating, local 10/16/2013  . HLD (hyperlipidemia) 10/16/2013  . Depression, major, single episode, in partial remission ([place]) 10/16/2013  . Amnesia 10/16/2013  . Adiposity 10/16/2013  . Arthritis, degenerative 10/16/2013  . Restless leg 10/16/2013  . Adaptation reaction 10/16/2013  . Thrombophlebitis of superficial veins of lower extremity 10/16/2013  . Essential (primary) hypertension 10/16/2013  . Avitaminosis D  10/16/2013  . Awareness of heartbeats 09/16/2013  . Premature complex, ventricular 09/16/2013    Past Medical History:  Past Medical History:  Diagnosis Date  . Allergic rhinitis   . Anxiety state   . Arthritis, degenerative   . Atypical migraine   . Awareness of heartbeats   . Cancer of left kidney ([facility])    Left Kidney  . Cervical radiculitis   . CFIDS (chronic fatigue and immune dysfunction syndrome) ([place])   . Depression, major, single episode, in partial remission ([place])   . Depression, neurotic   . Elevated serum creatinine 03/2016   history of (2.07)  . Endometrial adenocarcinoma ([place])    Stage II grade2  . Essential (primary) hypertension   . Gastric ulcer   . Heart murmur    pt. denies  . HLD (hyperlipidemia)   . Inflammation of hand joint   . Memory difficulties   . Premature complex, ventricular   . Renal disorder   . Renal mass, left   . Restless leg   . Shortness of breath dyspnea    occasional  . 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 Tremor    L arm  . Vitamin D deficiency    Past Surgical History:  Past Surgical History:  Procedure Laterality Date  . c sections x 3     . CARPAL TUNNEL RELEASE     rt hand  . CRANIOTOMY N/A 02/04/2019   Procedure: Suboccipital CRANIECTOMY for  TUMOR EXCISION;  Surgeon: Consuella Lose, MD;  Location: [facility];  Service: Neurosurgery;  Laterality: N/A;  . LAPAROSCOPIC NEPHRECTOMY Left 05/21/2014   Procedure: LEFT  RADICAL LAPAROSCOPIC NEPHRECTOMY;  Surgeon: Ardis Hughs, MD;  Location: [facility];  Service: Urology;  Laterality: Left;  Marland Kitchen MANDIBLE SURGERY    . NEPHRECTOMY TRANSPLANTED ORGAN     Left kidney removed  . ROBOTIC ASSISTED TOTAL HYSTERECTOMY WITH BILATERAL SALPINGO OOPHERECTOMY  12/27/2015  . THYROIDECTOMY N/A 05/09/2017   Procedure: TOTAL THYROIDECTOMY WITH LIMITED CENTRAL COMPARTMENT LYMPH NODE DISSECTION;  Surgeon: Armandina Gemma, MD;  Location: [facility];  Service: General;  Laterality:  N/A;  . TOTAL THYROIDECTOMY     05-09-17 Dr. Harlow Asa    Assessment & Plan Clinical Impression: Patient is a 64 y.o. year old right-handed female with history of renal cancer with left radical laparoscopic nephrectomy 05/21/2014, endometrial adenocarcinoma 2017, hypothyroidism after XRT for papillary thyroid cancer, chronic fatigue and immune dysfunction syndrome, , remote tobacco use, early dementia maintained on Aricept as well as BuSpar. History taken from chart review due to cognition and son taken from patient. Patient states she lives with spouse 1 level home. They have a daughter-in-law who can assist as needed. Patient ambulating with assistance prior to admission. Presented 02/03/2019 with gait instability. Abdominal cervical chest CTs revealed multiple nodules diffusely suggestive of metastatic disease. CT of the head reviewed showing hemorrhagic mass with vasogenic edema. She underwent suboccipital craniotomy resection of tumor on 02/04/2019 per Dr. Kathyrn Sheriff. Placed on Keppra for seizure prophylaxis as well as Decadron protocol established. Subcutaneous heparin for DVT prophylaxis 02/12/2019. Cardiology services consulted 02/06/2019 for tachybradycardia syndrome and telemetry carefully reviewed showing no episodes of true tachycardia noted she did have some PVCs heart rate as low as 35 but not sustained. No current indications for pacemaker and monitored. Patient also found to have compression fractures at T12 and L3 as well as lobulated mass lesion in the medial aspect of the middle right lobe with adjacent satellite nodules and mediastinal and right hilar adenopathy. She was fitted with a TLSO back brace applied in sitting position. Tolerating a regular diet. Therapy evaluations completed and patient was admitted for a comprehensive rehab program. Patient transferred to [facility] on 02/16/2019 .   Patient currently requires mod with mobility secondary to muscle weakness, decreased  cardiorespiratoy endurance, impaired timing and sequencing, decreased coordination and decreased motor planning, decreased visual motor skills, decreased problem solving and decreased memory and decreased sitting balance, decreased standing balance, decreased postural control, decreased balance strategies and difficulty maintaining precautions.  Prior to hospitalization, patient was independent  with mobility and lived with Spouse in a [place] home.  Home access is  Level entry.  Patient will benefit from skilled PT intervention to maximize safe functional mobility, minimize fall risk and decrease caregiver burden for planned discharge home with 24 hour assist.  Anticipate patient will benefit from follow up [facility] at discharge.  PT - End of Session Activity Tolerance: Tolerates 30+ min activity with multiple rests Endurance Deficit: Yes Endurance Deficit Description: Required frequent rest breaks between activity PT Assessment PT Patient demonstrates impairments in the following area(s): Balance;Behavior;Endurance;Edema;Motor;Pain;Perception;Safety;Skin Integrity PT Transfers Functional Problem(s): Bed Mobility;Bed to Chair;Car;Furniture;Floor PT Locomotion Functional Problem(s): Ambulation;Wheelchair Mobility;Stairs PT Plan PT Intensity: Minimum of 1-2 x/day ,45 to 90 minutes PT Frequency: 5 out of 7 days PT Duration Estimated Length of Stay: 10-14 days PT Treatment/Interventions: Ambulation/gait training;Discharge planning;Functional mobility training;Psychosocial support;Therapeutic Activities;Visual/perceptual remediation/compensation;Balance/vestibular training;Disease management/prevention;Neuromuscular re-education;Skin care/wound management;Therapeutic Exercise;Wheelchair propulsion/positioning;Cognitive remediation/compensation;DME/adaptive equipment instruction;Pain management;Splinting/orthotics;UE/LE Strength taining/ROM;Community reintegration;Functional electrical  stimulation;Patient/family education;Stair training;UE/LE Coordination activities PT Transfers Anticipated Outcome(s): CGA using LRAD PT Locomotion Anticipated Outcome(s): CGA using LRAD 150 feet  PT Recommendation Recommendations for Other Services: Neuropsych consult;Therapeutic Recreation consult Therapeutic Recreation Interventions: Stress management Follow Up Recommendations: Home health PT Patient destination: Home Equipment Details: Patient has a cane, may need RW and w/c, will continue to assess based on patient's progress  Skilled Therapeutic Intervention In addition to the PT evaluation below, the patient performed the following skilled PT interventions: Patient in bed upon PT arrival. Patient alert and agreeable to PT session. Patient denied pain throughout session.   Therapeutic Activity: Bed Mobility: Patient performed bed mobility as described below. Donned TLSO with patient sitting EOB with total A. Patient unable to recall spinal precautions. PT education on spinal precautions using teach-back method.  Transfers: Patient performed sit to/from stand and stand pivot with mod A without an AD using B HHA and min-mod A using a RW. Provided verbal cues with use of RW for hand placement, leaning far forward to stand, foot placement, scooting forward prior to standing, and reaching back to sit. She performed a simulated sedan height car transfer and squat pivot transfer with mod A with hand-over hand assist for hand placement and forward trunk lean and min A for placing LEs in the car.  Gait Training:  Patient ambulated 3 feet with B HHA with mod A and 40 feet using RW with min-mod A. Ambulated as below without AD, improved step length, forward weight shift, and foot clearance using RW with cues for technique.  Wheelchair Mobility:  Patient propelled wheelchair 55 feet, limited by fatigue, with mod A without cues from therapist for evaluation.  Patient in w/c at end of session with  breaks locked, seat belt alarm set, and all needs within reach.   Instructed pt in results of PT evaluation as detailed below, PT POC, rehab potential, rehab goals, and discharge recommendations. Additionally discussed CIR's policies regarding fall safety and use of chair alarm and/or quick release belt. Pt verbalized understanding and in agreement. Will update pt's family members as they become available.    PT Evaluation Precautions/Restrictions Precautions Precautions: Back Required Braces or Orthoses: Spinal Brace Spinal Brace: Thoracolumbosacral orthotic Restrictions Weight Bearing Restrictions: No General   Vital Signs Pain Pain Assessment Pain Scale: 0-10 Pain Score: 0-No pain Home Living/Prior Functioning Home Living Available Help at Discharge: Family;Available 24 hours/day Type of Home: Apartment Home Access: Level entry Home Layout: One level Bathroom Shower/Tub: Multimedia programmer: Standard Additional Comments: Reports her husband and other family can assist 24/7  Lives With: Spouse Prior Function Level of Independence: Independent with transfers;Independent with basic ADLs;Independent with homemaking with ambulation;Independent with gait(per patient report) Driving: No Vocation: Retired 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, 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 Range of Motion: Within Functional Limits Alignment/Gaze Preference: Within Defined Limits Tracking/Visual Pursuits: Decreased smoothness of eye movement to LEFT superior field;Decreased smoothness of eye movement to LEFT inferior field;Decreased smoothness of eye movement to RIGHT inferior field;Decreased smoothness of eye movement to RIGHT superior field;Decreased smoothness of horizontal tracking;Decreased smoothness of vertical tracking Saccades: Undershoots Convergence: Within functional limits Perception Perception: Within Functional  Limits Praxis Praxis: Intact  Cognition Overall Cognitive Status: History of cognitive impairments - at baseline Arousal/Alertness: Awake/alert Orientation Level: Oriented to person;Oriented to place;Disoriented to time;Disoriented to situation Attention: Selective Selective Attention: Appears intact Memory: Impaired Memory Impairment: Storage deficit;Retrieval deficit;Decreased short term memory;Decreased long term memory Decreased Long Term Memory: Verbal basic;Functional basic Decreased Short Term Memory: Verbal basic;Functional basic Awareness: Appears intact Problem Solving: Impaired Problem Solving Impairment:  Verbal complex;Functional complex Executive Function: Organizing;Reasoning Reasoning: Appears intact Organizing: Impaired Organizing Impairment: Verbal complex Safety/Judgment: Appears intact Sensation Sensation Light Touch: Appears Intact Proprioception: Impaired Detail Proprioception Impaired Details: Impaired RLE Coordination Gross Motor Movements are Fluid and Coordinated: No Coordination and Movement Description: decreased activity tolerance and midline orientation with functional mobilty Motor  Motor Motor: Abnormal postural alignment and control Motor - Skilled Clinical Observations: Strong posterior lean in standing  Mobility Bed Mobility Bed Mobility: Rolling Right;Rolling Left;Supine to Sit;Sit to Supine Rolling Right: Supervision/verbal cueing Rolling Left: Supervision/Verbal cueing Supine to Sit: Minimal Assistance - Patient > 75% Sit to Supine: Minimal Assistance - Patient > 75% Transfers Transfers: Sit to Stand;Stand to Sit;Stand Pivot Transfers;Squat Pivot Transfers Sit to Stand: Moderate Assistance - Patient 50-74% Stand to Sit: Moderate Assistance - Patient 50-74% Stand Pivot Transfers: Moderate Assistance - Patient 50 - 74% Squat Pivot Transfers: Moderate Assistance - Patient 50-74% Transfer (Assistive device): None Locomotion   Gait Ambulation: Yes Gait Assistance: Moderate Assistance - Patient 50-74% Gait Distance (Feet): 3 Feet Assistive device: None Gait Assistance Details: Manual facilitation for weight shifting Gait Gait: Yes Gait Pattern: Step-through pattern;Decreased step length - left;Decreased step length - right;Decreased stride length;Decreased hip/knee flexion - right;Decreased hip/knee flexion - left;Left foot flat;Right foot flat;Shuffle;Decreased trunk rotation;Narrow base of support;Poor foot clearance - right;Poor foot clearance - left(strong posterior lean) Gait velocity: decreased Stairs / Additional Locomotion Stairs: No(decreased safety due to posterior lean in standing) Wheelchair Mobility Wheelchair Mobility: Yes Wheelchair Assistance: Minimal assistance - Patient >75% Wheelchair Propulsion: Both upper extremities Wheelchair Parts Management: Needs assistance Distance: 75'  Trunk/Postural Assessment  Cervical Assessment Cervical Assessment: Exceptions to WFL(forward head) Thoracic Assessment Thoracic Assessment: Exceptions to WFL(kyphosis) Lumbar Assessment Lumbar Assessment: Exceptions to WFL(posterior pelvic tilt) Postural Control Postural Control: Deficits on evaluation(Strong posterior lean in standing; decreased/delayed)  Balance Balance Balance Assessed: Yes Static Sitting Balance Static Sitting - Balance Support: Right upper extremity supported;Left upper extremity supported;Feet supported Static Sitting - Level of Assistance: 5: Stand by assistance Dynamic Sitting Balance Dynamic Sitting - Balance Support: Right upper extremity supported;Left upper extremity supported;Feet supported Dynamic Sitting - Level of Assistance: 5: Stand by assistance Dynamic Sitting - Balance Activities: Lateral lean/weight shifting;Forward lean/weight shifting;Reaching for objects Static Standing Balance Static Standing - Balance Support: Bilateral upper extremity supported;During functional  activity Static Standing - Level of Assistance: 3: Mod assist Dynamic Standing Balance Dynamic Standing - Balance Support: Bilateral upper extremity supported;During functional activity Dynamic Standing - Level of Assistance: 3: Mod assist Extremity Assessment  RLE Assessment RLE Assessment: Exceptions to [facility] Active Range of Motion (AROM) Comments: WFL for all functional mobility General Strength Comments: Grossly in sitting: 4+/5 throughout LLE Assessment LLE Assessment: Within Functional Limits Active Range of Motion (AROM) Comments: WFL for all functional limits General Strength Comments: Grossly in sitting 5/5 throughout    Refer to Care Plan for Long Term Goals  Recommendations for other services: Neuropsych and Therapeutic Recreation  Stress management  Discharge Criteria: Patient will be discharged from PT if patient refuses treatment 3 consecutive times without medical reason, if treatment goals not met, if there is a change in medical status, if patient makes no progress towards goals or if patient is discharged from hospital.  The above assessment, treatment plan, treatment alternatives and goals were discussed and mutually agreed upon: by patient  Doreene Burke PT, DPT  02/17/2019, 12:44 PM

## 2019-02-17 NOTE — Discharge Instructions (Signed)
Inpatient Rehab Discharge Instructions  Krista Hodges Discharge date and time: No discharge date for patient encounter.   Activities/Precautions/ Functional Status: Activity: Back brace as advised Diet: regular diet Wound Care: keep wound clean and dry Functional status:  ___ No restrictions     ___ Walk up steps independently ___ 24/7 supervision/assistance   ___ Walk up steps with assistance ___ Intermittent supervision/assistance  ___ Bathe/dress independently ___ Walk with walker     _x__ Bathe/dress with assistance ___ Walk Independently    ___ Shower independently ___ Walk with assistance    ___ Shower with assistance ___ No alcohol     ___ Return to work/school ________  Special Instructions: No driving smoking or alcohol   My questions have been answered and I understand these instructions. I will adhere to these goals and the provided educational materials after my discharge from the hospital.  Patient/Caregiver Signature _______________________________ Date __________  Clinician Signature _______________________________________ Date __________  Please bring this form and your medication list with you to all your follow-up doctor's appointments.

## 2019-02-17 NOTE — Progress Notes (Signed)
[place] PHYSICAL MEDICINE & REHABILITATION PROGRESS NOTE   Subjective/Complaints:  Discussed PT eval, min/Mod A few feet with out AD, can go 25f with walker, strong posterior lean   RTOS- neg CP, SOB,  N/V/D  Objective:   No results found. Recent Labs    02/16/19 1710  WBC 14.3*  HGB 10.5*  HCT 32.7*  PLT 229   Recent Labs    02/16/19 1710  CREATININE 0.94    Intake/Output Summary (Last 24 hours) at 02/17/2019 0635 Last data filed at 02/16/2019 2051 Gross per 24 hour  Intake 120 ml  Output 400 ml  Net -280 ml     Physical Exam: Vital Signs Blood pressure 138/73, pulse (!) 56, temperature 97.9 F (36.6 C), temperature source Oral, resp. rate 18, height '5\' 5"'$  (1.651 m), weight 70.2 kg, SpO2 99 %.     Assessment/Plan: 1. Functional deficits secondary to RIght cerebellar met, endometrial primary  which require 3+ hours per day of interdisciplinary therapy in a comprehensive inpatient rehab setting.  Physiatrist is providing close team supervision and 24 hour management of active medical problems listed below.  Physiatrist and rehab team continue to assess barriers to discharge/monitor patient progress toward functional and medical goals  Care Tool:  Bathing              Bathing assist       Upper Body Dressing/Undressing Upper body dressing   What is the patient wearing?: Hospital gown only    Upper body assist Assist Level: Moderate Assistance - Patient 50 - 74%    Lower Body Dressing/Undressing Lower body dressing      What is the patient wearing?: Incontinence brief     Lower body assist Assist for lower body dressing: Total Assistance - Patient < 25%     Toileting Toileting    Toileting assist Assist for toileting: Maximal Assistance - Patient 25 - 49%     Transfers Chair/bed transfer  Transfers assist     Chair/bed transfer assist level: Maximal Assistance - Patient 25 - 49%     Locomotion Ambulation   Ambulation  assist              Walk 10 feet activity   Assist           Walk 50 feet activity   Assist           Walk 150 feet activity   Assist           Walk 10 feet on uneven surface  activity   Assist           Wheelchair     Assist               Wheelchair 50 feet with 2 turns activity    Assist            Wheelchair 150 feet activity     Assist          Blood pressure 138/73, pulse (!) 56, temperature 97.9 F (36.6 C), temperature source Oral, resp. rate 18, height '5\' 5"'$  (1.651 m), weight 70.2 kg, SpO2 99 %.  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 Patient with slow Decadron taperdecreased to 2 mg twice daily x48 hours and stop -patient may shower             -  admit to inpatient rehab -ELOS/Goals: 17-20 days, min assist goals PT, OT, supervision SLP 2. Antithrombotics: -DVT/anticoagulation:Subcutaneous heparin. Check vascular study -antiplatelet therapy: N/A 3. Pain Management:Hydrocodone as needed -back brace             -consider pre-treating for therapies 4.Zoloft 100 mg daily,:Wellbutrin 200 mg daily, BuSpar'15mg'$  twice daily -antipsychotic agents: N/A 5. Neuropsych: This patientis notcapable of making decisions on herown behalf. 6. Skin/Wound Care:Routine skin checks -probably can remove staples from cervical-occipital incision in AM  7. Fluids/Electrolytes/Nutrition:Routine in and outs with follow-up chemistries 8. Hypertension. Avapro 300 mg daily. Monitor with increased mobility Vitals:   02/16/19 2050 02/17/19 0414  BP: (!) 142/68 138/73  Pulse: (!) 55 (!) 56  Resp: 18 18  Temp: 98.1 F (36.7 C)  97.9 F (36.6 C)  SpO2: 99% 99%   9. Hypothyroidism. Synthroid 10. Hyperlipidemia. Crestor 11. Restless leg syndrome. Mirapex 0.5 mg nightly 12. History of renal cancer with left radical laparoscopic nephrectomy 05/21/2014. As well as history of endometrial adenocarcinoma 2017. Follow-up outpatient 13. Constipation. Laxative assistance 14.  HypoK mild KCL 47mq x 1   LOS: 1 days A FACE TO FACE EVALUATION WAS PERFORMED  ACharlett Blake1/06/2019, 6:35 AM

## 2019-02-17 NOTE — Plan of Care (Signed)
  Problem: Consults Goal: RH BRAIN INJURY PATIENT EDUCATION Description: Description: See Patient Education module for eduction specifics Outcome: Progressing Goal: Skin Care Protocol Initiated - if Braden Score 18 or less Description: If consults are not indicated, leave blank or document N/A Outcome: Progressing Goal: Nutrition Consult-if indicated Outcome: Progressing Goal: Diabetes Guidelines if Diabetic/Glucose > 140 Description: If diabetic or lab glucose is > 140 mg/dl - Initiate Diabetes/Hyperglycemia Guidelines & Document Interventions  Outcome: Progressing   Problem: RH BOWEL ELIMINATION Goal: RH STG MANAGE BOWEL WITH ASSISTANCE Description: STG Manage Bowel with Assistance. Outcome: Progressing Goal: RH STG MANAGE BOWEL W/MEDICATION W/ASSISTANCE Description: STG Manage Bowel with Medication with Assistance. Outcome: Progressing   Problem: RH BLADDER ELIMINATION Goal: RH STG MANAGE BLADDER WITH ASSISTANCE Description: STG Manage Bladder With Assistance Outcome: Progressing Goal: RH STG MANAGE BLADDER WITH MEDICATION WITH ASSISTANCE Description: STG Manage Bladder With Medication With Assistance. Outcome: Progressing Goal: RH STG MANAGE BLADDER WITH EQUIPMENT WITH ASSISTANCE Description: STG Manage Bladder With Equipment With Assistance Outcome: Progressing   Problem: RH SKIN INTEGRITY Goal: RH STG SKIN FREE OF INFECTION/BREAKDOWN Outcome: Progressing Goal: RH STG ABLE TO PERFORM INCISION/WOUND CARE W/ASSISTANCE Description: STG Able To Perform Incision/Wound Care With Assistance. Outcome: Progressing   Problem: RH SAFETY Goal: RH STG ADHERE TO SAFETY PRECAUTIONS W/ASSISTANCE/DEVICE Description: STG Adhere to Safety Precautions With Assistance/Device. Outcome: Progressing Goal: RH STG DECREASED RISK OF FALL WITH ASSISTANCE Description: STG Decreased Risk of Fall With Assistance. Outcome: Progressing   Problem: RH COGNITION-NURSING Goal: RH STG USES MEMORY  AIDS/STRATEGIES W/ASSIST TO PROBLEM SOLVE Description: STG Uses Memory Aids/Strategies With Assistance to Problem Solve. Outcome: Progressing   Problem: RH KNOWLEDGE DEFICIT BRAIN INJURY Goal: RH STG INCREASE KNOWLEDGE OF SELF CARE AFTER BRAIN INJURY Outcome: Progressing Goal: RH STG INCREASE KNOWLEDGE OF DYSPHAGIA/FLUID INTAKE Outcome: Progressing

## 2019-02-17 NOTE — Progress Notes (Signed)
Physical Therapy Session Note  Patient Details  Name: Krista Hodges MRN: [id number] Date of Birth: 08/05/1955  Today's Date: 02/17/2019 PT Individual Time: 1305-1335 PT Individual Time Calculation (min): 30 min   Short Term Goals: Week 1:  PT Short Term Goal 1 (Week 1): Patient will perform basic transfers with min A using LRAD. PT Short Term Goal 2 (Week 1): Patient will perform dynamic standing balance with min A using LRAD. PT Short Term Goal 3 (Week 1): Patient will ambulate 100 feet with min A using LRAD.  Skilled Therapeutic Interventions/Progress Updates:  Pt presented in w/c agreeable to therapy. Pt stating no significant pain at present and unable to rate. Pt transported to rehab gym for energy conservation and performed ascending/descending stairs x 4 with B rails due to pt unable to attempt without rails due to posterior lean. Pt performed ascending/descending stairs with minA and increased time with step to pattern. Pt then performed stand pivot to mat and performed STS x 5 with RW for BLE strengthening and static balance.  Pt then participated in round of horseshoes for static balance with moderate challenges reaching outside BOS. Pt was noted to require encouragement to reach outside BOS however was able to maintain neutral without posterior lean. Pt then transferred back to w/c with CGA and verbal cues for sequencing and transported back to room. Pt remained in w/c at end of session with belt alarm on, call bell within reach and needs met.   Therapy Documentation Precautions:  Precautions Precautions: Back Required Braces or Orthoses: Spinal Brace Spinal Brace: Thoracolumbosacral orthotic Restrictions Weight Bearing Restrictions: No General:   Vital Signs: Therapy Vitals Pulse Rate: (!) 58 Resp: 14 BP: 130/70 Patient Position (if appropriate): Sitting Oxygen Therapy SpO2: 99 % O2 Device: Room Air Pain: Pain Assessment Pain Scale: 0-10 Pain Score: 7  Pain Type:  Acute pain Pain Location: Back Pain Descriptors / Indicators: Aching;Discomfort Pain Onset: On-going Patients Stated Pain Goal: 2 Pain Intervention(s): Medication (See eMAR);RN made aware;Repositioned;Cold applied Multiple Pain Sites: No Exercises:   Other Treatments:      Therapy/Group: Individual Therapy      , PTA  02/17/2019, 4:19 PM  

## 2019-02-17 NOTE — Care Management (Signed)
Patient Details  Name: Krista Hodges MRN: [id number] Date of Birth: 04-13-1955  Today's Date: 02/17/2019  Problem List:  Patient Active Problem List   Diagnosis Date Noted  . Cerebellar tumor ([place]) 02/16/2019  . History of renal cell cancer   . Status post nephrectomy   . Endometrial adenocarcinoma ([place])   . Chronic fatigue   . Stage 3 chronic kidney disease   . Leucocytosis   . Dementia ([place]) 02/11/2019  . Brain tumor ([place]) 02/03/2019  . Hypokalemia 02/03/2019  . Hypothyroidism 02/03/2019  . Papillary thyroid carcinoma ([place]) 05/05/2017  . Tremor 01/31/2017  . Left renal mass 05/21/2014  . Idiopathic acute pancreatitis   . Urinary tract infectious disease   . Pancreatic mass   . Other acute pancreatitis   . Left kidney mass   . Acute pancreatitis 04/03/2014  . PVC (premature ventricular contraction) 10/20/2013  . Dyspnea 10/20/2013  . Allergic rhinitis 10/16/2013  . Anxiety state 10/16/2013  . Enthesopathy of hip 10/16/2013  . Cervical radiculitis 10/16/2013  . 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 CFIDS (chronic fatigue and immune dysfunction syndrome) ([place]) 10/16/2013  . Inflammation of hand joint 10/16/2013  . Atypical migraine 10/16/2013  . Excessive sweating, local 10/16/2013  . HLD (hyperlipidemia) 10/16/2013  . Depression, major, single episode, in partial remission ([place]) 10/16/2013  . Amnesia 10/16/2013  . Adiposity 10/16/2013  . Arthritis, degenerative 10/16/2013  . Restless leg 10/16/2013  . Adaptation reaction 10/16/2013  . Thrombophlebitis of superficial veins of lower extremity 10/16/2013  . Essential (primary) hypertension 10/16/2013  . Avitaminosis D 10/16/2013  . Awareness of heartbeats 09/16/2013  . Premature complex, ventricular 09/16/2013   Past Medical History:  Past Medical History:  Diagnosis Date  . Allergic rhinitis   . Anxiety state   . Arthritis, degenerative   . Atypical migraine   . Awareness of heartbeats   . Cancer of left  kidney (HCC)    Left Kidney  . Cervical radiculitis   . CFIDS (chronic fatigue and immune dysfunction syndrome) ([place])   . Depression, major, single episode, in partial remission ([place])   . Depression, neurotic   . Elevated serum creatinine 03/2016   history of (2.07)  . Endometrial adenocarcinoma ([place])    Stage II grade2  . Essential (primary) hypertension   . Gastric ulcer   . Heart murmur    pt. denies  . HLD (hyperlipidemia)   . Inflammation of hand joint   . Memory difficulties   . Premature complex, ventricular   . Renal disorder   . Renal mass, left   . Restless leg   . Shortness of breath dyspnea    occasional  . 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 Tremor    L arm  . Vitamin D deficiency    Past Surgical History:  Past Surgical History:  Procedure Laterality Date  . c sections x 3     . CARPAL TUNNEL RELEASE     rt hand  . CRANIOTOMY N/A 02/04/2019   Procedure: Suboccipital CRANIECTOMY for  TUMOR EXCISION;  Surgeon: Consuella Lose, MD;  Location: [facility];  Service: Neurosurgery;  Laterality: N/A;  . LAPAROSCOPIC NEPHRECTOMY Left 05/21/2014   Procedure: LEFT RADICAL LAPAROSCOPIC NEPHRECTOMY;  Surgeon: Ardis Hughs, MD;  Location: [facility];  Service: Urology;  Laterality: Left;  Marland Kitchen MANDIBLE SURGERY    . NEPHRECTOMY TRANSPLANTED ORGAN     Left kidney removed  . ROBOTIC ASSISTED TOTAL HYSTERECTOMY WITH BILATERAL SALPINGO OOPHERECTOMY  12/27/2015  . THYROIDECTOMY N/A 05/09/2017   Procedure: TOTAL THYROIDECTOMY WITH LIMITED CENTRAL COMPARTMENT LYMPH NODE DISSECTION;  Surgeon: Armandina Gemma, MD;  Location: [facility];  Service: General;  Laterality: N/A;  . TOTAL THYROIDECTOMY     05-09-17 Dr. Harlow Asa   Social History:  reports that she quit smoking about 11 years ago. Her smoking use included cigarettes. She smoked 1.00 pack per day. She has never used smokeless tobacco. She reports current alcohol use. She reports that she does not use  drugs.  Family / Support Systems Marital Status: Married Patient Roles: Spouse Spouse/Significant Other: Mason Jim Children: Daughter: Marcine Matar and Son Ellanora Tanis Other Supports: Daughter-in-law [name] and Lovey Newcomer available outside work hours to assist Anticipated Caregiver: Spouse: Marcello Moores during the day, children after work hours and hired caregivers during the day if needed Ability/Limitations of Caregiver: Thomas:supervision Caregiver Availability: 24/7 Family Dynamics: Supportive family  Social History Preferred language: English Religion: Unknown Read: Yes Write: Yes Employment Status: Retired Date Retired/Disabled/Unemployed: Retired from Nursing   Abuse/Neglect Abuse/Neglect Assessment Can Be Completed: Yes Physical Abuse: Denies Verbal Abuse: Denies Sexual Abuse: Denies Exploitation of patient/patient's resources: Denies Self-Neglect: Denies  Emotional Status Pt's affect, behavior and adjustment status: Normal mood and behavior. Baseline STMD/LTMD. Currently oriented to person and place, disoriented to situation and date. Recent Psychosocial Issues: Sundowning and some confusion at night Psychiatric History: Depressive disorder/Dementia  Patient / Family Perceptions, Expectations & Goals Pt/Family understanding of illness & functional limitations: Patient with decreased awareness of functional limitations, memory loss and disorientation. Spouse appears to have good understanding of illness and functional limitation Premorbid pt/family roles/activities: Spouse managed medications, cooking , finances and home managment Anticipated changes in roles/activities/participation: Spouse will continue to manage home and medications at discharge with assistance from children Pt/family expectations/goals: Minimal assistance goals set.  Occupational psychologist available at discharge: Spouse will provide transportation at discharge  Discharge Planning Living  Arrangements: Spouse/significant other Support Systems: Spouse/significant other, Children Type of Residence: Private residence Does the patient have any problems obtaining your medications?: No Home Management: Spouse and children will manage home Patient/Family Preliminary Plans: Discharge home with spouse and daughter/daughter in law will assist as needed, hired help will be obtained if needed Social Work Anticipated Follow Up Needs: HH/OP Expected length of stay: 14 days  Clinical Impression Patient appears to be tolerating therapy schedule, working on functional limitations but is limited some by pain controlled with prn medications. She is not troubled by TLSO brace. Some sundowning/confusion reported at night although appears to be alert, doing well during the day. History of memory loss, dementia and decreased awareness of deficits, appears to be coping well with her current situation.  Dorien Chihuahua B 02/17/2019, 3:15 PM

## 2019-02-17 NOTE — Progress Notes (Signed)
Patient information reviewed and entered into [facility] System by Becky Majesti Gambrell, PPS coordinator. Information including medical coding, function ability, and quality indicators will be reviewed and updated through discharge.   

## 2019-02-18 ENCOUNTER — Inpatient Hospital Stay (HOSPITAL_COMMUNITY): Payer: Medicare Other

## 2019-02-18 ENCOUNTER — Inpatient Hospital Stay (HOSPITAL_COMMUNITY): Payer: Medicare Other | Admitting: Physical Therapy

## 2019-02-18 ENCOUNTER — Encounter (HOSPITAL_COMMUNITY): Payer: Medicare Other | Admitting: Psychology

## 2019-02-18 DIAGNOSIS — Z87898 Personal history of other specified conditions: Secondary | ICD-10-CM

## 2019-02-18 MED ORDER — DOCUSATE SODIUM 100 MG PO CAPS
200.0000 mg | ORAL_CAPSULE | Freq: Two times a day (BID) | ORAL | Status: DC
  Administered 2019-02-18 – 2019-02-27 (×18): 200 mg via ORAL
  Filled 2019-02-18 (×19): qty 2

## 2019-02-18 MED ORDER — ENOXAPARIN SODIUM 40 MG/0.4ML ~~LOC~~ SOLN
40.0000 mg | SUBCUTANEOUS | Status: DC
  Administered 2019-02-18 – 2019-02-25 (×8): 40 mg via SUBCUTANEOUS
  Filled 2019-02-18 (×8): qty 0.4

## 2019-02-18 NOTE — Plan of Care (Signed)
  Problem: Consults Goal: RH BRAIN INJURY PATIENT EDUCATION Description: Description: See Patient Education module for eduction specifics Outcome: Progressing Goal: Skin Care Protocol Initiated - if Braden Score 18 or less Description: If consults are not indicated, leave blank or document N/A Outcome: Progressing Goal: Nutrition Consult-if indicated Outcome: Progressing Goal: Diabetes Guidelines if Diabetic/Glucose > 140 Description: If diabetic or lab glucose is > 140 mg/dl - Initiate Diabetes/Hyperglycemia Guidelines & Document Interventions  Outcome: Progressing   Problem: RH BOWEL ELIMINATION Goal: RH STG MANAGE BOWEL WITH ASSISTANCE Description: STG Manage Bowel with Assistance. Outcome: Progressing Goal: RH STG MANAGE BOWEL W/MEDICATION W/ASSISTANCE Description: STG Manage Bowel with Medication with Assistance. Outcome: Progressing   Problem: RH BLADDER ELIMINATION Goal: RH STG MANAGE BLADDER WITH ASSISTANCE Description: STG Manage Bladder With Assistance Outcome: Progressing Goal: RH STG MANAGE BLADDER WITH MEDICATION WITH ASSISTANCE Description: STG Manage Bladder With Medication With Assistance. Outcome: Progressing Goal: RH STG MANAGE BLADDER WITH EQUIPMENT WITH ASSISTANCE Description: STG Manage Bladder With Equipment With Assistance Outcome: Progressing   Problem: RH SKIN INTEGRITY Goal: RH STG SKIN FREE OF INFECTION/BREAKDOWN Outcome: Progressing Goal: RH STG ABLE TO PERFORM INCISION/WOUND CARE W/ASSISTANCE Description: STG Able To Perform Incision/Wound Care With Assistance. Outcome: Progressing   Problem: RH SAFETY Goal: RH STG ADHERE TO SAFETY PRECAUTIONS W/ASSISTANCE/DEVICE Description: STG Adhere to Safety Precautions With Assistance/Device. Outcome: Progressing Goal: RH STG DECREASED RISK OF FALL WITH ASSISTANCE Description: STG Decreased Risk of Fall With Assistance. Outcome: Progressing   Problem: RH COGNITION-NURSING Goal: RH STG USES MEMORY  AIDS/STRATEGIES W/ASSIST TO PROBLEM SOLVE Description: STG Uses Memory Aids/Strategies With Assistance to Problem Solve. Outcome: Progressing   Problem: RH KNOWLEDGE DEFICIT BRAIN INJURY Goal: RH STG INCREASE KNOWLEDGE OF SELF CARE AFTER BRAIN INJURY Outcome: Progressing Goal: RH STG INCREASE KNOWLEDGE OF DYSPHAGIA/FLUID INTAKE Outcome: Progressing

## 2019-02-18 NOTE — Progress Notes (Signed)
Occupational Therapy Session Note  Patient Details  Name: Krista Hodges MRN: [id number] Date of Birth: 1955-10-22  Today's Date: 02/18/2019 OT Individual Time: 3358-2518 OT Individual Time Calculation (min): 74 min    Short Term Goals: Week 1:  OT Short Term Goal 1 (Week 1): Pt will perform LB dressing with mod A and use of AE as needed. OT Short Term Goal 2 (Week 1): Pt will perform toileting with min A for standing balance during hygiene and clothing management. OT Short Term Goal 3 (Week 1): Pt will maintain standing at sink for 5 minutes while performing grooming tasks at sink.  Skilled Therapeutic Interventions/Progress Updates:    Pt received supine with no c/o pain, finishing up breakfast. Pt agreeable to bathing/dressing EOB.  Pt required CGA to transition to EOB. Pt sat EOB unsupported with minor balance perturbations but no LOB. Pt followed all bathing/dressing commands without cueing. Pt completed UB bathing and dressing (including bra) with (S). Pt requested to use bathroom. Max A to don TLSO EOB. Sit > stand from EOB with min A. Mod A to complete functional mobility into the bathroom with RW. R inattention evident during doorway navigation, requiring min cueing. Pt required min A for toileting tasks overall. Pt required max A to don new brief. Pt returned to her w/c with RW, requiring cueing for increased stride length and RW management. Pt able to thread BLE into pants without bending to adhere to back precautions from low w/c level. Min A to pull pants up in standing, with poor proprioception observed in the RUE. Teds donned total A. Pt able to don socks with min A to hold BLE in figure 4 position. Pt completed oral care at the sink with (S). Pt was taken to the therapy gym in w/c. Pt completed activity with working memory and Engineer, maintenance. Pt required as much as mod A for dynamic standing balance when turning with RW, with posterior lean. Mod cueing overall  required for working memory, pt with poor carryover throughout session. Frequent cueing required for UE placement with each sit <> stand. Pt was returned to her room and left sitting up in the w/c with chair alarm belt fastened and all needs met.   Therapy Documentation Precautions:  Precautions Precautions: Back Required Braces or Orthoses: Spinal Brace Spinal Brace: Thoracolumbosacral orthotic Restrictions Weight Bearing Restrictions: No   Therapy/Group: Individual Therapy  Curtis Sites 02/18/2019, 6:56 AM

## 2019-02-18 NOTE — Progress Notes (Signed)
Team Conference Report to Patient/Family  Team Conference discussion was reviewed with the patient, including goals, any changes in plan of care and target discharge date of 03/03/19.  Patient expressed understanding and is in agreement.  The patient has a target discharge date of  03/03/19 with CGA - supervision goals. Currently at [facility] assist level and max A with RW due to post lean and dis coordination and low back pain limitations.   Dorien Chihuahua B 02/18/2019, 4:56 PM

## 2019-02-18 NOTE — Consult Note (Signed)
Neuropsychological Consultation   Patient:   Krista Hodges   DOB:   [birth year]  MR Number:  [id number]  Location:  [facility] [place] [id number] [place] [address] Dept: [place]: [phone]           Date of Service:   02/18/2019  Start Time:   1 PM End Time:   2 PM  Provider/Observer:  Ilean Skill, Psy.D.       Clinical Neuropsychologist       Billing Code/Service: [phone]  Chief Complaint:    Krista Hodges is a 64 year old female with history of renal cancer, endometrial adenocarcinoma, hypothyroidism, papillary thyroid cancer, chronic fatigue and immune dysfunction syndrome, remote tobacco use, history of memory loss maintained on Aricept as well as BuSpar.  Neuropsych eval in 2019 felt that memory loss at that time was mostly due to depression/anxiety but patient had continue to report worsening of memory.  Recent complaint of tremor being witnessed by family.  Patient presented on 02/03/2019 with gait instability.  CT revealed multiple nodules diffusely suggestive of metastatic disease.  CT head showed hemorrhagic mass with vasogenic edema.  Underwent suboccipital craniotomy resection of tumor on 02/04/2019.  Placed on Keppra.  Patient also found to have compression fractures at T12 and L3 as well as lobulated mass lesion in medial aspect of middle right lobe.  Patient now in [place].      Reason for Service:  Patient was referred for neuropsychological consultation due to current rehab efforts following recent craniotomy resection of brain tumor.  Dx of metastatic disease etc.  Below is th HPI for the current admission.  [id number] Krista Hodges is a 64 year old right-handed female with history of renal cancer with left radical laparoscopic nephrectomy 05/21/2014, endometrial adenocarcinoma 2017, hypothyroidism after XRT for papillary thyroid cancer, chronic fatigue and immune dysfunction  syndrome, , remote tobacco use, early dementia maintained on Aricept as well as BuSpar. History taken from chart review due to cognition and son taken from patient. Patient states she lives with spouse 1 level home. They have a daughter-in-law who can assist as needed. Patient ambulating with assistance prior to admission. Presented 02/03/2019 with gait instability. Abdominal cervical chest CTs revealed multiple nodules diffusely suggestive of metastatic disease. CT of the head reviewed showing hemorrhagic mass with vasogenic edema. She underwent suboccipital craniotomy resection of tumor on 02/04/2019 per Dr. Kathyrn Sheriff. Placed on Keppra for seizure prophylaxis as well as Decadron protocol established. Subcutaneous heparin for DVT prophylaxis 02/12/2019. Cardiology services consulted 02/06/2019 for tachybradycardia syndrome and telemetry carefully reviewed showing no episodes of true tachycardia noted she did have some PVCs heart rate as low as 35 but not sustained. No current indications for pacemaker and monitored. Patient also found to have compression fractures at T12 and L3 as well as lobulated mass lesion in the medial aspect of the middle right lobe with adjacent satellite nodules and mediastinal and right hilar adenopathy. She was fitted with a TLSO back brace applied in sitting position. Tolerating a regular diet. Therapy evaluations completed and patient was admitted for a comprehensive rehab program.  Current Status:  Patient's affect was generally flat but denied significant exacerbation of depresion and anxiety.  Patient reports that her memory functions had been generally stable prior to December and she had be well taken care of by neurologist prior to metastatic disease process.  Patient reports that she is generally alert and mental status was  WNL.    Behavioral Observation: Krista Hodges  presents as a 64 y.o.-year-old Right Caucasian Female who appeared her stated age. her  dress was Appropriate and she was Well Groomed and her manners were Appropriate to the situation.  her participation was indicative of Appropriate and Redirectable behaviors.  There were any physical disabilities noted.  she displayed an appropriate level of cooperation and motivation.     Interactions:    Active Appropriate and Redirectable  Attention:   abnormal and attention span appeared shorter than expected for age  Memory:   abnormal; remote memory intact, recent memory impaired  Visuo-spatial:  not examined  Speech (Volume):  low  Speech:   normal; Slowed response pattern  Thought Process:  Coherent and Relevant  Though Content:  WNL; not suicidal and not homicidal  Orientation:   person, place, time/date and situation  Judgment:   Fair  Planning:   Poor  Affect:    Anxious and Depressed  Mood:    Dysphoric  Insight:   Fair  Intelligence:   normal   Medical History:   Past Medical History:  Diagnosis Date  . Allergic rhinitis   . Anxiety state   . Arthritis, degenerative   . Atypical migraine   . Awareness of heartbeats   . Cancer of left kidney (HCC)    Left Kidney  . Cervical radiculitis   . CFIDS (chronic fatigue and immune dysfunction syndrome) ([place])   . Depression, major, single episode, in partial remission ([place])   . Depression, neurotic   . Elevated serum creatinine 03/2016   history of (2.07)  . Endometrial adenocarcinoma ([place])    Stage II grade2  . Essential (primary) hypertension   . Gastric ulcer   . Heart murmur    pt. denies  . HLD (hyperlipidemia)   . Inflammation of hand joint   . Memory difficulties   . Premature complex, ventricular   . Renal disorder   . Renal mass, left   . Restless leg   . Shortness of breath dyspnea    occasional  . 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 Tremor    L arm  . Vitamin D deficiency      Psychiatric History:  Past history of significant depression and anxiety, followed  by neurology and psychiatry in past.    Family Med/Psych History:  Family History  Problem Relation Age of Onset  . Cancer Mother   . Heart attack Father   . Diabetes Other   . Dementia Neg Hx    Impression/DX:  Krista Hodges is a 64 year old female with history of renal cancer, endometrial adenocarcinoma, hypothyroidism, papillary thyroid cancer, chronic fatigue and immune dysfunction syndrome, remote tobacco use, history of memory loss maintained on Aricept as well as BuSpar.  Neuropsych eval in 2019 felt that memory loss at that time was mostly due to depression/anxiety but patient had continue to report worsening of memory.  Recent complaint of tremor being witnessed by family.  Patient presented on 02/03/2019 with gait instability.  CT revealed multiple nodules diffusely suggestive of metastatic disease.  CT head showed hemorrhagic mass with vasogenic edema.  Underwent suboccipital craniotomy resection of tumor on 02/04/2019.  Placed on Keppra.  Patient also found to have compression fractures at T12 and L3 as well as lobulated mass lesion in medial aspect of middle right lobe.  Patient now in [place].      Patient's affect was generally flat but denied  significant exacerbation of depresion and anxiety.  Patient reports that her memory functions had been generally stable prior to December and she had be well taken care of by neurologist prior to metastatic disease process.  Patient reports that she is generally alert and mental status was WNL.    Disposition/Plan:  Worked on coping and assessed mood status.  If depression/anxiet worsen to point of impacting therapies will see patient again.        Electronically Signed   _______________________ Ilean Skill, Psy.D.

## 2019-02-18 NOTE — IPOC Note (Signed)
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[facility])     Functional Problem List: Nursing Behavior, Bladder, Bowel, Edema, Endurance, Medication Management, Pain, Safety, Skin Integrity, Perception  PT Balance, Behavior, Endurance, Edema, Motor, Pain, Perception, Safety, Skin Integrity  OT Balance, Behavior, Endurance, Pain, Safety, Cognition, Motor, Vision, Sensory  SLP    TR         Basic ADL's: OT Grooming, Bathing, Dressing, Toileting     Advanced  ADL's: OT       Transfers: PT Bed Mobility, Bed to Chair, Car, Furniture, Floor  OT Toilet, Tub/Shower     Locomotion: PT Ambulation, Emergency planning/management officer, Stairs     Additional Impairments: OT None  SLP        TR      Anticipated Outcomes Item Anticipated Outcome  Self Feeding n/a  Swallowing      Basic self-care  S - CGA  Toileting  CGA   Bathroom Transfers CGA  Bowel/Bladder  mod assist  Transfers  CGA using LRAD  Locomotion  CGA using LRAD 150 feet  Communication     Cognition     Pain  les<3  Safety/Judgment  mod assist   Therapy Plan: PT Intensity: Minimum of 1-2 x/day ,45 to 90 minutes PT Frequency: 5 out of 7 days PT Duration Estimated Length of Stay: 10-14 days OT Intensity: Minimum of 1-2 x/day, 45 to 90 minutes OT Frequency: 5 out of 7 days OT Duration/Estimated Length of Stay: 10-14 days SLP Duration/Estimated Length of Stay: n/a for ST   Due to the current state of emergency, patients may not be receiving their 3-hours of Medicare-mandated therapy.   Team Interventions: Nursing Interventions Patient/Family Education, Disease Management/Prevention, Skin Care/Wound Management, Discharge Planning, Bladder Management, Pain Management, Psychosocial Support, Cognitive Remediation/Compensation, Medication Management  PT interventions  Ambulation/gait training, Discharge planning, Functional mobility training, Psychosocial support, Therapeutic Activities, Visual/perceptual remediation/compensation, Balance/vestibular training, Disease management/prevention, Neuromuscular re-education, Skin care/wound management, Therapeutic Exercise, Wheelchair propulsion/positioning, Cognitive remediation/compensation, DME/adaptive equipment instruction, Pain management, Splinting/orthotics, UE/LE Strength taining/ROM, Community reintegration, Technical sales engineer stimulation, Patient/family education, IT trainer, UE/LE Coordination activities  OT Interventions Training and development officer, Engineer, drilling, Patient/family education, Therapeutic Activities, Functional electrical stimulation, Psychosocial support, Therapeutic Exercise, Wheelchair propulsion/positioning, Community reintegration, Functional mobility training, Self Care/advanced ADL retraining, Discharge planning, Neuromuscular re-education, UE/LE Coordination activities, Pain management, Disease mangement/prevention, UE/LE Strength taining/ROM, Cognitive remediation/compensation  SLP Interventions    TR Interventions    SW/CM Interventions Discharge Planning, Disease Management/Prevention, Psychosocial Support, Patient/Family Education   Barriers to Discharge MD  Medical stability and Pending chemo/radiation  Nursing      PT      OT Other (comments) none known at this time  SLP      SW       Team Discharge Planning: Destination: PT-Home ,OT- Home , SLP-Home Projected Follow-up: PT-Home health PT, OT-  Home health OT, 24 hour supervision/assistance, SLP-24 hour supervision/assistance Projected Equipment Needs: PT- , OT- To be determined, SLP-None recommended by SLP Equipment Details: PT-Patient has a cane, may need RW and w/c, will continue to assess based on patient's progress, OT-  Patient/family involved in discharge planning: PT- Patient,  OT-Patient,  Family member/caregiver, SLP-Patient, Family member/caregiver  MD ELOS: 10-14d Medical Rehab Prognosis:  Good Assessment:  -year-old right-handed female with history of renal cancer with left radical laparoscopic nephrectomy 05/21/2014, endometrial adenocarcinoma 2017, hypothyroidism  after XRT for papillary thyroid cancer, chronic fatigue and immune dysfunction syndrome, , remote tobacco use, early dementia maintained on Aricept as well as BuSpar. History taken from chart review due to cognition and son taken from patient. Patient states she lives with spouse 1 level home. They have a daughter-in-law who can assist as needed. Patient ambulating with assistance prior to admission. Presented 02/03/2019 with gait instability. Abdominal cervical chest CTs revealed multiple nodules diffusely suggestive of metastatic disease. CT of the head reviewed showing hemorrhagic mass with vasogenic edema. She underwent suboccipital craniotomy resection of tumor on 02/04/2019 per Dr. Kathyrn Sheriff. Placed on Keppra for seizure prophylaxis as well as Decadron protocol established. Subcutaneous heparin for DVT prophylaxis 02/12/2019. Cardiology services consulted 02/06/2019 for tachybradycardia syndrome and telemetry carefully reviewed showing no episodes of true tachycardia noted she did have some PVCs heart rate as low as 35 but not sustained. No current indications for pacemaker and monitored. Patient also found to have compression fractures at T12 and L3 as well as lobulated mass lesion in the medial aspect of the middle right lobe with adjacent satellite nodules and mediastinal and right hilar adenopathy. She was fitted with a TLSO back brace applied in sitting position. Tolerating a regular diet.   Now requiring 24/7 Rehab RN,MD, as well as CIR level PT, OT and SLP.  Treatment team will focus on ADLs and mobility with goals set at [place] See Team Conference Notes for weekly updates to the plan of care

## 2019-02-18 NOTE — Progress Notes (Signed)
Physical Therapy Session Note  Patient Details  Name: Krista Hodges MRN: [id number] Date of Birth: [birth year]  Today's Date: 02/18/2019 PT Individual Time: 1035-1130 1420-1520 PT Individual Time Calculation (min): 55 min and 60 min   Short Term Goals: Week 1:  PT Short Term Goal 1 (Week 1): Patient will perform basic transfers with min A using LRAD. PT Short Term Goal 2 (Week 1): Patient will perform dynamic standing balance with min A using LRAD. PT Short Term Goal 3 (Week 1): Patient will ambulate 100 feet with min A using LRAD.  Skilled Therapeutic Interventions/Progress Updates:  Session 1.  Pt received sitting in WC and agreeable to PT.  Stand pivot transfer to and from [facility] with min assist using RW for safety. Nustep BLE and BUE reciprocal movement training x 8 mintues with min cues for midline orientation to prevent R lateral lean.   PT instructed pt in otatgo level A and B in the parallel bars with min assist for safety and cues to improve weight shift to the L. Sit<>stand x 5, mini squat x 8, hip abduction x 8, reciprocal march x 8, forward gait/retrogait 2x 57f. Side stepping 2 x 654f Tandem stance 2 x 10 sec BLE. Min-mod cues for midline orientation, increased step width, and full ROM. Pt also required cues for anterior weight shifting to prevent posterior LOB intermittently.   [facility] to [facility] and [facility] and all needs met.     Session 2.  Pt returned to room and performed sit<>supine with supervision assist anle mild use of bed rail. PT assist pt to don TLSO sitting EOB. Stand pivot [facility] to [facility] and RW with cues for posture and AD management.   Pt transported to rehab gym in [facility] training with RW x 6019fith min assist overall and moderate cues for gait pattern in turn as well as improved weight shifting L, step width on the RLE and anterior weight shifting. PT also instructed in dynamic gait training to weave  through 3 cones x 2 with min-mod assist to prevent R LOB with multimodal cues for AD management and increased step length and width on the R.  Dynamic balance training to perform lateral reaches to the L and horse shoe toss to target 2 x 10. Mod assist initially, then progressing to supervision assist by the end of the second bout with min cues for midline awareness, and improved WB through forefoot to prevent posterior LOB.   WC mobility through hall of rehab unit with min assist from PT to maintain straight path as well as improved symmetry of RUE with L to prevent veer to the R. Pt returned to room and performed stand pivot transfer to bed with RW and min assist. Sit>supine completed with supervision assist once PT removed TLSO, and left supine in bed with call bell in reach and all needs met.        Therapy Documentation Precautions:  Precautions Precautions: Back Required Braces or Orthoses: Spinal Brace Spinal Brace: Thoracolumbosacral orthotic Restrictions Weight Bearing Restrictions: No    Vital Signs: Therapy Vitals Temp: 97.8 F (36.6 C) Pulse Rate: 64 Resp: 14 BP: 127/65 Patient Position (if appropriate): Sitting Oxygen Therapy SpO2: 99 % O2 Device: Room Air Pain: denies   Therapy/Group: Individual Therapy  AusLorie Phenix6/2021, 2:47 PM

## 2019-02-18 NOTE — Patient Care Conference (Signed)
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 History of memory loss   . Cerebellar tumor ([place]) 02/16/2019  . History of renal cell cancer   . Status post nephrectomy   . Endometrial adenocarcinoma ([place])   . Chronic fatigue   . Stage 3 chronic kidney disease   . Leucocytosis   . Dementia ([place]) 02/11/2019  . Brain tumor ([place]) 02/03/2019  . Hypokalemia 02/03/2019  . Hypothyroidism 02/03/2019  . Papillary thyroid carcinoma ([place]) 05/05/2017  . Tremor 01/31/2017  . Left renal mass 05/21/2014  . Idiopathic acute pancreatitis   . Urinary tract infectious disease   . Pancreatic mass   . Other acute pancreatitis   . Left kidney mass   . Acute pancreatitis 04/03/2014  . PVC (premature ventricular contraction) 10/20/2013  . Dyspnea 10/20/2013  . Allergic rhinitis 10/16/2013  . Anxiety state 10/16/2013  . Enthesopathy of hip 10/16/2013  . Cervical radiculitis 10/16/2013  . 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 CFIDS (chronic fatigue and immune dysfunction syndrome) ([place]) 10/16/2013  . Inflammation of hand joint 10/16/2013  . Atypical migraine 10/16/2013  . Excessive sweating, local 10/16/2013  . HLD (hyperlipidemia) 10/16/2013  . Depression, major, single episode, in partial remission ([place]) 10/16/2013  . Amnesia 10/16/2013  . Adiposity 10/16/2013  . Arthritis, degenerative 10/16/2013  . Restless leg 10/16/2013  . Adaptation reaction 10/16/2013  . Thrombophlebitis of superficial veins of lower extremity 10/16/2013  . Essential (primary) hypertension  10/16/2013  . Avitaminosis D 10/16/2013  . Awareness of heartbeats 09/16/2013  . Premature complex, ventricular 09/16/2013    Expected Discharge Date: Expected Discharge Date: 03/03/19  Team Members Present: Physician leading conference: Dr. Alysia Penna Social Worker Present: Lennart Pall, LCSW Nurse Present: Other (comment)(Blair Montanti, LPN) Case Manager: Karene Fry, RN PT Present: Barrie Folk, PT;Rosita Dechalus, PTA OT Present: Laverle Hobby, OT SLP Present: Weston Anna, SLP PPS Coordinator present : Ileana Ladd, Burna Mortimer, SLP     Current Status/Progress Goal Weekly Team Focus  Bowel/Bladder   continent of b/b; LBM: 01/05  maintain regular bowel pattern  assist with tolieting needs prn   Swallow/Nutrition/ Hydration             ADL's   mod - max A for self care tasks. Mod A functional transfers with RW  supervision for grooming and UB self care. CGA for all other goals  pt/family education, strengthening, ADL retraining, maintaining precautions, endurance   Mobility   mod assist transfers with and without AD. min-mod assist ambulation with RW due to poor midline orientation. min assist WC mobility up to 75 ft.  supervision assist transfers with LRAD. CGA ambulation with LRAD at house hold leve.  improved midline orientaiton. safety with trasnfers as well as and bed mobility . improved independence with gait and transfers with LRAD   Communication             Safety/Cognition/ Behavioral Observations            Pain   no  c/o pain  remain pain free  assess skin QS and prn   Skin   skin intact  maintain skin intergrity  assess skin QS and prn    Rehab Goals Patient on target to meet rehab goals: Yes *See Care Plan and progress notes for long and short-term goals.     Barriers to Discharge  Current Status/Progress Possible Resolutions Date Resolved   Nursing                  PT                    OT Other (comments)  none known at this time              SLP                SW     Spouse managed medications, cooking, cleaning and finances prior to admission. Patient with premorbid dementia/depressive disorder with sundowning and STMD/LTMD requiring assistance prior to admission.          Discharge Planning/Teaching Needs:  Home with spouse who can assist, daughter /daughter in law to assist as needed  TBD   Team Discussion: 64 yo with cerebellar mets, oncology follow up after rehab, wound good, BP stable.  RN incision good, cont bowel, inc bladder, low back pain, pain meds given.  OT mod A walker and transfers, S UB dressing, Min A LB dressing, goals CGA/S.  PT mod bed, transfers mod/max, RW min/mod a, strong post lean standing.  SLP signed off, dementia at baseline.   Revisions to Treatment Plan: N/A     Medical Summary Current Status: Ataxia truncal greater than limb, blood pressure control is fair, problems with constipation Weekly Focus/Goal: Adjust laxatives, initiate rehabilitation program  Barriers to Discharge: Medical stability;Wound care   Possible Resolutions to Barriers: Continue current medications except as noted above monitor wound drainage   Continued Need for Acute Rehabilitation Level of Care: The patient requires daily medical management by a physician with specialized training in physical medicine and rehabilitation for the following reasons: Direction of a multidisciplinary physical rehabilitation program to maximize functional independence : Yes Medical management of patient stability for increased activity during participation in an intensive rehabilitation regime.: Yes Analysis of laboratory values and/or radiology reports with any subsequent need for medication adjustment and/or medical intervention. : Yes   I attest that I was present, lead the team conference, and concur with the assessment and plan of the team.   Retta Diones 02/19/2019, 7:21 PM   Team conference was held via web/  teleconference due to [place] - 19

## 2019-02-18 NOTE — Progress Notes (Signed)
[place] PHYSICAL MEDICINE & REHABILITATION PROGRESS NOTE   Subjective/Complaints:  No issues overnite, constipated, no problems with urination or with appetite  RTOS- neg CP, SOB,  N/V/D  Objective:   VAS [place] LOWER EXTREMITY VENOUS (DVT)  Result Date: 02/17/2019  Lower Venous Study Indications: Swelling, and Edema.  Comparison Study: 09/2015 Performing Technologist: Antonieta Pert RDMS, RVT  Examination Guidelines: A complete evaluation includes B-mode imaging, spectral Doppler, color Doppler, and power Doppler as needed of all accessible portions of each vessel. Bilateral testing is considered an integral part of a complete examination. Limited examinations for reoccurring indications may be performed as noted.  +---------+---------------+---------+-----------+----------+--------------+ RIGHT    CompressibilityPhasicitySpontaneityPropertiesThrombus Aging +---------+---------------+---------+-----------+----------+--------------+ CFV      Full           Yes      Yes                                 +---------+---------------+---------+-----------+----------+--------------+ SFJ      Full                                                        +---------+---------------+---------+-----------+----------+--------------+ FV Prox  Full                                                        +---------+---------------+---------+-----------+----------+--------------+ FV Mid   Full                                                        +---------+---------------+---------+-----------+----------+--------------+ FV DistalFull                                                        +---------+---------------+---------+-----------+----------+--------------+ PFV      Full                                                        +---------+---------------+---------+-----------+----------+--------------+ POP      Full           Yes      Yes                                  +---------+---------------+---------+-----------+----------+--------------+ PTV      Full                                                        +---------+---------------+---------+-----------+----------+--------------+  PERO     Full                                                        +---------+---------------+---------+-----------+----------+--------------+ GSV      Full                                                        +---------+---------------+---------+-----------+----------+--------------+   +---------+---------------+---------+-----------+----------+--------------+ LEFT     CompressibilityPhasicitySpontaneityPropertiesThrombus Aging +---------+---------------+---------+-----------+----------+--------------+ CFV      Full           Yes      Yes                                 +---------+---------------+---------+-----------+----------+--------------+ SFJ      Full                                                        +---------+---------------+---------+-----------+----------+--------------+ FV Prox  Full                                                        +---------+---------------+---------+-----------+----------+--------------+ FV Mid   Full                                                        +---------+---------------+---------+-----------+----------+--------------+ FV DistalFull                                                        +---------+---------------+---------+-----------+----------+--------------+ PFV      Full                                                        +---------+---------------+---------+-----------+----------+--------------+ POP      Full           Yes      Yes                                 +---------+---------------+---------+-----------+----------+--------------+ PTV      Full                                                         +---------+---------------+---------+-----------+----------+--------------+  PERO     Full                                                        +---------+---------------+---------+-----------+----------+--------------+ GSV      Full                                                        +---------+---------------+---------+-----------+----------+--------------+     Summary: Right: There is no evidence of deep vein thrombosis in the lower extremity. No cystic structure found in the popliteal fossa. Left: There is no evidence of deep vein thrombosis in the lower extremity. No cystic structure found in the popliteal fossa.  *See table(s) above for measurements and observations. Electronically signed by Harold Barban MD on 02/17/2019 at 7:38:52 PM.    Final    Recent Labs    02/16/19 1710 02/17/19 0600  WBC 14.3* 13.8*  HGB 10.5* 10.8*  HCT 32.7* 33.2*  PLT 229 213   Recent Labs    02/16/19 1710 02/17/19 0600  NA  --  138  K  --  3.4*  CL  --  104  CO2  --  26  GLUCOSE  --  92  BUN  --  15  CREATININE 0.94 1.08*  CALCIUM  --  9.9    Intake/Output Summary (Last 24 hours) at 02/18/2019 0742 Last data filed at 02/17/2019 1901 Gross per 24 hour  Intake 360 ml  Output 300 ml  Net 60 ml     Physical Exam: Vital Signs Blood pressure (!) 146/80, pulse 68, temperature 98 F (36.7 C), temperature source Oral, resp. rate 18, height '5\' 5"'$  (1.651 m), weight 70.2 kg, SpO2 99 %.   General: No acute distress Mood and affect are appropriate Heart: Regular rate and rhythm no rubs murmurs or extra sounds Lungs: Clear to auscultation, breathing unlabored, no rales or wheezes Abdomen: Positive bowel sounds, soft nontender to palpation, nondistended Extremities: No clubbing, cyanosis, or edema Skin: No evidence of breakdown, no evidence of rash Neurologic: Cranial nerves II through XII intact, motor strength is 5/5 in bilateral deltoid, bicep, tricep, grip, hip flexor, knee extensors,  ankle dorsiflexor and plantar flexor Sensory exam normal sensation to light touch and proprioception in bilateral upper and lower extremities Cerebellar exam normal finger to nose to finger as well as heel to shin in bilateral upper and lower extremities Musculoskeletal: Full range of motion in all 4 extremities. No joint swelling   Assessment/Plan: 1. Functional deficits secondary to RIght cerebellar met, endometrial primary  which require 3+ hours per day of interdisciplinary therapy in a comprehensive inpatient rehab setting.  Physiatrist is providing close team supervision and 24 hour management of active medical problems listed below.  Physiatrist and rehab team continue to assess barriers to discharge/monitor patient progress toward functional and medical goals  Care Tool:  Bathing  Bathing activity did not occur: Refused           Bathing assist       Upper Body Dressing/Undressing Upper body dressing   What is the patient wearing?: Pull over shirt, Hospital gown only    Upper body assist Assist Level:  Moderate Assistance - Patient 50 - 74%    Lower Body Dressing/Undressing Lower body dressing      What is the patient wearing?: Incontinence brief     Lower body assist Assist for lower body dressing: Maximal Assistance - Patient 25 - 49%     Toileting Toileting    Toileting assist Assist for toileting: Maximal Assistance - Patient 25 - 49%     Transfers Chair/bed transfer  Transfers assist     Chair/bed transfer assist level: Moderate Assistance - Patient 50 - 74%     Locomotion Ambulation   Ambulation assist      Assist level: Moderate Assistance - Patient 50 - 74% Assistive device: No Device Max distance: 3'   Walk 10 feet activity   Assist  Walk 10 feet activity did not occur: Safety/medical concerns(decreased balance/strong posterior lean in standing)        Walk 50 feet activity   Assist Walk 50 feet with 2 turns activity did  not occur: Safety/medical concerns(decreased balance/strong posterior lean in standing)         Walk 150 feet activity   Assist Walk 150 feet activity did not occur: Safety/medical concerns(decreased balance/strong posterior lean in standing)         Walk 10 feet on uneven surface  activity   Assist Walk 10 feet on uneven surfaces activity did not occur: Safety/medical concerns(decreased balance/strong posterior lean in standing)         Wheelchair     Assist   Type of Wheelchair: Manual    Wheelchair assist level: Minimal Assistance - Patient > 75% Max wheelchair distance: 36'    Wheelchair 50 feet with 2 turns activity    Assist    Wheelchair 50 feet with 2 turns activity did not occur: Safety/medical concerns   Assist Level: Minimal Assistance - Patient > 75%   Wheelchair 150 feet activity     Assist  Wheelchair 150 feet activity did not occur: Safety/medical concerns(decreased activity tolerance)       Blood pressure (!) 146/80, pulse 68, temperature 98 F (36.7 C), temperature source Oral, resp. rate 18, height '5\' 5"'$  (1.651 m), weight 70.2 kg, SpO2 99 %.  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 Patient with slow Decadron taperdecreased to 2 mg twice daily x48 hours and stop -patient may shower             -Team conference today please see physician documentation under team conference tab, met with team face-to-face to discuss problems,progress, and goals. Formulized individual treatment plan based on medical history, underlying problem and comorbidities.  -ELOS/Goals: 17-20 days, min assist goals PT, OT, supervision SLP 2. Antithrombotics: -DVT/anticoagulation:Subcutaneous heparin. Check  vascular study -antiplatelet therapy: N/A 3. Pain Management:Hydrocodone as needed -back brace             -consider pre-treating for therapies 4.Zoloft 100 mg daily,:Wellbutrin 200 mg daily, BuSpar'15mg'$  twice daily -antipsychotic agents: N/A 5. Neuropsych: This patientis notcapable of making decisions on herown behalf. 6. Skin/Wound Care:Routine skin checks -probably can remove staples from cervical-occipital incision in AM  7. Fluids/Electrolytes/Nutrition:Routine in and outs with follow-up chemistries 8. Hypertension. Avapro 300 mg daily. Monitor with increased mobility Vitals:   02/17/19 2034 02/18/19 0517  BP: 121/78 (!) 146/80  Pulse: (!) 56 68  Resp: 18 18  Temp: 98.3 F (36.8 C) 98 F (36.7 C)  SpO2: 97% 99%   9. Hypothyroidism. Synthroid 10. Hyperlipidemia. Crestor 11. Restless leg syndrome. Mirapex 0.5 mg nightly 12. History of renal cancer with left radical laparoscopic nephrectomy 05/21/2014. As well as history of endometrial adenocarcinoma 2017. Follow-up outpatient 13. Constipation. Laxative assistance 14.  HypoK mild KCL 39mq x 1   LOS: 2 days A FACE TO FACE EVALUATION WAS PERFORMED  ACharlett Blake1/07/2019, 7:42 AM

## 2019-02-19 ENCOUNTER — Inpatient Hospital Stay (HOSPITAL_COMMUNITY): Payer: Medicare Other | Admitting: Physical Therapy

## 2019-02-19 ENCOUNTER — Inpatient Hospital Stay (HOSPITAL_COMMUNITY): Payer: Medicare Other

## 2019-02-19 NOTE — Progress Notes (Signed)
Occupational Therapy Session Note  Patient Details  Name: JANETH SAETEURN MRN: [id number] Date of Birth: [birth year]  Today's Date: 02/19/2019 OT Individual Time: [id number] OT Individual Time Calculation (min): 25 min    Short Term Goals: Week 1:  OT Short Term Goal 1 (Week 1): Pt will perform LB dressing with mod A and use of AE as needed. OT Short Term Goal 2 (Week 1): Pt will perform toileting with min A for standing balance during hygiene and clothing management. OT Short Term Goal 3 (Week 1): Pt will maintain standing at sink for 5 minutes while performing grooming tasks at sink.  Skilled Therapeutic Interventions/Progress Updates:    1:1. Pt received in w/c agreeable to tx reporting 7/10 pain. RN already delivered pain medicaiont and repositioned at end of session for comfort. Pt completes sit to stand at high low table with min A with improved midline orientation this date. Pt completes standing >15 min to build pipe tree figure with MAX question cues to problem solve. Pt able to maintain static balance with S-CGA with ant/post sway. Exited session with pt seated in w/c, belt alarm on and call light in reach  Therapy Documentation Precautions:  Precautions Precautions: Back Required Braces or Orthoses: Spinal Brace Spinal Brace: Thoracolumbosacral orthotic Restrictions Weight Bearing Restrictions: No General:   Vital Signs: Therapy Vitals Pulse Rate: 66 BP: 133/73 Patient Position (if appropriate): Sitting Pain: Pain Assessment Pain Scale: 0-10 Pain Score: 8  Pain Type: Acute pain Pain Location: Rib cage Pain Orientation: Left Pain Descriptors / Indicators: Aching;Discomfort Pain Frequency: Intermittent Pain Onset: On-going Patients Stated Pain Goal: 3 Pain Intervention(s): Medication (See eMAR)(norco given) ADL:   Vision   Perception    Praxis   Exercises:   Other Treatments:     Therapy/Group: Individual Therapy  Tonny Branch 02/19/2019, 11:58  AM

## 2019-02-19 NOTE — Progress Notes (Signed)
[place] PHYSICAL MEDICINE & REHABILITATION PROGRESS NOTE   Subjective/Complaints:  No issues overnite, constipated, no problems with urination or with appetite  RTOS- neg CP, SOB,  N/V/D  Objective:   VAS [place] LOWER EXTREMITY VENOUS (DVT)  Result Date: 02/17/2019  Lower Venous Study Indications: Swelling, and Edema.  Comparison Study: 09/2015 Performing Technologist: Antonieta Pert RDMS, RVT  Examination Guidelines: A complete evaluation includes B-mode imaging, spectral Doppler, color Doppler, and power Doppler as needed of all accessible portions of each vessel. Bilateral testing is considered an integral part of a complete examination. Limited examinations for reoccurring indications may be performed as noted.  +---------+---------------+---------+-----------+----------+--------------+ RIGHT    CompressibilityPhasicitySpontaneityPropertiesThrombus Aging +---------+---------------+---------+-----------+----------+--------------+ CFV      Full           Yes      Yes                                 +---------+---------------+---------+-----------+----------+--------------+ SFJ      Full                                                        +---------+---------------+---------+-----------+----------+--------------+ FV Prox  Full                                                        +---------+---------------+---------+-----------+----------+--------------+ FV Mid   Full                                                        +---------+---------------+---------+-----------+----------+--------------+ FV DistalFull                                                        +---------+---------------+---------+-----------+----------+--------------+ PFV      Full                                                        +---------+---------------+---------+-----------+----------+--------------+ POP      Full           Yes      Yes                                  +---------+---------------+---------+-----------+----------+--------------+ PTV      Full                                                        +---------+---------------+---------+-----------+----------+--------------+  PERO     Full                                                        +---------+---------------+---------+-----------+----------+--------------+ GSV      Full                                                        +---------+---------------+---------+-----------+----------+--------------+   +---------+---------------+---------+-----------+----------+--------------+ LEFT     CompressibilityPhasicitySpontaneityPropertiesThrombus Aging +---------+---------------+---------+-----------+----------+--------------+ CFV      Full           Yes      Yes                                 +---------+---------------+---------+-----------+----------+--------------+ SFJ      Full                                                        +---------+---------------+---------+-----------+----------+--------------+ FV Prox  Full                                                        +---------+---------------+---------+-----------+----------+--------------+ FV Mid   Full                                                        +---------+---------------+---------+-----------+----------+--------------+ FV DistalFull                                                        +---------+---------------+---------+-----------+----------+--------------+ PFV      Full                                                        +---------+---------------+---------+-----------+----------+--------------+ POP      Full           Yes      Yes                                 +---------+---------------+---------+-----------+----------+--------------+ PTV      Full                                                         +---------+---------------+---------+-----------+----------+--------------+  PERO     Full                                                        +---------+---------------+---------+-----------+----------+--------------+ GSV      Full                                                        +---------+---------------+---------+-----------+----------+--------------+     Summary: Right: There is no evidence of deep vein thrombosis in the lower extremity. No cystic structure found in the popliteal fossa. Left: There is no evidence of deep vein thrombosis in the lower extremity. No cystic structure found in the popliteal fossa.  *See table(s) above for measurements and observations. Electronically signed by Harold Barban MD on 02/17/2019 at 7:38:52 PM.    Final    Recent Labs    02/16/19 1710 02/17/19 0600  WBC 14.3* 13.8*  HGB 10.5* 10.8*  HCT 32.7* 33.2*  PLT 229 213   Recent Labs    02/16/19 1710 02/17/19 0600  NA  --  138  K  --  3.4*  CL  --  104  CO2  --  26  GLUCOSE  --  92  BUN  --  15  CREATININE 0.94 1.08*  CALCIUM  --  9.9    Intake/Output Summary (Last 24 hours) at 02/19/2019 0832 Last data filed at 02/19/2019 0451 Gross per 24 hour  Intake 253 ml  Output 200 ml  Net 53 ml     Physical Exam: Vital Signs Blood pressure 133/70, pulse 63, temperature 98.4 F (36.9 C), temperature source Oral, resp. rate 18, height '5\' 5"'$  (1.651 m), weight 70.2 kg, SpO2 97 %.   General: No acute distress Mood and affect are appropriate Heart: Regular rate and rhythm no rubs murmurs or extra sounds Lungs: Clear to auscultation, breathing unlabored, no rales or wheezes Abdomen: Positive bowel sounds, soft nontender to palpation, nondistended Extremities: No clubbing, cyanosis, or edema Skin: No evidence of breakdown, no evidence of rash Neurologic: Cranial nerves II through XII intact, motor strength is 5/5 in bilateral deltoid, bicep, tricep, grip, hip flexor, knee extensors,  ankle dorsiflexor and plantar flexor Sensory exam normal sensation to light touch and proprioception in bilateral upper and lower extremities Cerebellar exam normal finger to nose to finger as well as heel to shin in bilateral upper and lower extremities Musculoskeletal: Full range of motion in all 4 extremities. No joint swelling   Assessment/Plan: 1. Functional deficits secondary to RIght cerebellar met, endometrial primary  which require 3+ hours per day of interdisciplinary therapy in a comprehensive inpatient rehab setting.  Physiatrist is providing close team supervision and 24 hour management of active medical problems listed below.  Physiatrist and rehab team continue to assess barriers to discharge/monitor patient progress toward functional and medical goals  Care Tool:  Bathing  Bathing activity did not occur: Refused Body parts bathed by patient: Right arm, Left arm, Abdomen, Chest, Front perineal area, Buttocks, Right upper leg, Left upper leg, Right lower leg, Left lower leg, Face         Bathing assist Assist Level: Minimal Assistance - Patient > 75%  Upper Body Dressing/Undressing Upper body dressing   What is the patient wearing?: Bra, Pull over shirt    Upper body assist Assist Level: Minimal Assistance - Patient > 75%    Lower Body Dressing/Undressing Lower body dressing      What is the patient wearing?: Incontinence brief     Lower body assist Assist for lower body dressing: Moderate Assistance - Patient 50 - 74%     Toileting Toileting    Toileting assist Assist for toileting: Moderate Assistance - Patient 50 - 74%     Transfers Chair/bed transfer  Transfers assist     Chair/bed transfer assist level: Moderate Assistance - Patient 50 - 74%     Locomotion Ambulation   Ambulation assist      Assist level: Moderate Assistance - Patient 50 - 74% Assistive device: No Device Max distance: 3'   Walk 10 feet activity   Assist   Walk 10 feet activity did not occur: Safety/medical concerns(decreased balance/strong posterior lean in standing)        Walk 50 feet activity   Assist Walk 50 feet with 2 turns activity did not occur: Safety/medical concerns(decreased balance/strong posterior lean in standing)         Walk 150 feet activity   Assist Walk 150 feet activity did not occur: Safety/medical concerns(decreased balance/strong posterior lean in standing)         Walk 10 feet on uneven surface  activity   Assist Walk 10 feet on uneven surfaces activity did not occur: Safety/medical concerns(decreased balance/strong posterior lean in standing)         Wheelchair     Assist   Type of Wheelchair: Manual    Wheelchair assist level: Minimal Assistance - Patient > 75% Max wheelchair distance: 63'    Wheelchair 50 feet with 2 turns activity    Assist    Wheelchair 50 feet with 2 turns activity did not occur: Safety/medical concerns   Assist Level: Minimal Assistance - Patient > 75%   Wheelchair 150 feet activity     Assist  Wheelchair 150 feet activity did not occur: Safety/medical concerns(decreased activity tolerance)       Blood pressure 133/70, pulse 63, temperature 98.4 F (36.9 C), temperature source Oral, resp. rate 18, height '5\' 5"'$  (1.651 m), weight 70.2 kg, SpO2 97 %.  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 Patient with slow Decadron taperdecreased to 2 mg twice daily x48 hours and stop -patient may shower        .CIR PT, OT  -ELOS/Goals: 1/19, min/CG assist goals PT, OT, supervision SLP 2. Antithrombotics: -DVT/anticoagulation:Subcutaneous heparin. Check vascular study -antiplatelet therapy:  N/A 3. Pain Management:Hydrocodone as needed -back brace             -consider pre-treating for therapies 4.Zoloft 100 mg daily,:Wellbutrin 200 mg daily, BuSpar'15mg'$  twice daily -antipsychotic agents: N/A 5. Neuropsych: This patientis notcapable of making decisions on herown behalf. 6. Skin/Wound Care:Routine skin checks -probably can remove staples from cervical-occipital incision 14d post op 7. Fluids/Electrolytes/Nutrition:Routine in and outs with follow-up chemistries 8. Hypertension. Avapro 300 mg daily. Monitor with increased mobility Vitals:   02/18/19 1937 02/19/19 0440  BP: 125/66 133/70  Pulse: 64 63  Resp: 18 18  Temp: 97.8 F (36.6 C) 98.4 F (36.9 C)  SpO2: 99% 97%  9. Hypothyroidism. Synthroid 10. Hyperlipidemia. Crestor 11. Restless leg syndrome. Mirapex 0.5 mg nightly 12. History of renal cancer with left radical laparoscopic nephrectomy 05/21/2014. As well as history of endometrial adenocarcinoma 2017. Follow-up outpatient- Dr Earlie Server 13. Constipation. Laxative assistance 14.  HypoK mild KCL 87mq x 1   LOS: 3 days [place] Christ[place] Hain 02/19/2019, 8:32 AM

## 2019-02-19 NOTE — Progress Notes (Signed)
Physical Therapy Session Note  Patient Details  Name: Krista Hodges MRN: [id number] Date of Birth: [birth year]  Today's Date: 02/19/2019 PT Individual Time: 0905-1005 and 1305-1400  PT Individual Time Calculation (min): 60 min and 55 min   Short Term Goals: Week 1:  PT Short Term Goal 1 (Week 1): Patient will perform basic transfers with min A using LRAD. PT Short Term Goal 2 (Week 1): Patient will perform dynamic standing balance with min A using LRAD. PT Short Term Goal 3 (Week 1): Patient will ambulate 100 feet with min A using LRAD.  Skilled Therapeutic Interventions/Progress Updates:  Session 1  Pt received supine in bed and agreeable to PT. Supine>sit transfer with supervision assist and min cues for reciprocal scooting to EOB. PT assist pt to don TLSO at EOB. Stand pivot transfer to [facility] with RW and min assist. Pt reports mild Wooziness once in WC PT assessed BP. See below. WC Mobility through hall x 74f with min cues for symmetry of movement with BUE and attention to the R for obstacles navigation.   PT instructed pt in sit<>stand transfers with min assist progressing to CGA with occassional cues for UE placement to improve safety to push into standing from arm rest. Standing balance training to complete low difficulty peg board puzzle from flat surface with min-supervision assist. And moderate difficulty peg board puzzle from airex pad and constant min assist and cues to prevent LOB to the R.   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 Cues also provided for safety with AD management in turns. Patient returned to room and [facility] and all needs met.    Session 2  Pt received supine in bed and agreeable to PT. Supine>sit transfer with supervision assist from semi reclined position. PT assisted pt to done TLSO. [facility] and mod assist due to posterior LOB.   Pt performed reciprocal movement training on kinetron. Sitting 3 x 1 min with cues for full ROM on the RLE. Standing march 3 x 45 sec with cues for midline orientation and full ROM on the L due to resistance to full hip and knee flexion from pushers syndrome. Stand pivot transfer to WC min assist and RW. Pt returned to room and performed stand pivot transfer to bed with min assist and RW with moderate cues for midline orientation. PT assisted pt to doff TLSO at EOB. Sit>supine completed with supervision assist, and left supine in bed with call bell in reach and all needs met.     Session 3.  Pt received supine in bed asleep. Pt aroused and requested to remain in bed and wait until tomorrow due to severe fatigue from eariler PT treatment. Pt left supine in bed with call bell in reach and all needs met.       Therapy Documentation Precautions:  Precautions Precautions: Back Required Braces or Orthoses: Spinal Brace Spinal Brace: Thoracolumbosacral orthotic Restrictions Weight Bearing Restrictions: No General: PT Amount of Missed Time (min): 45 Minutes PT Missed Treatment Reason: Patient fatigue Vital Signs: Therapy Vitals Pulse Rate: 66 BP: 133/73 Patient Position (if appropriate): Sitting Pain: Pain Assessment Pain Scale: 0-10 Pain Score: 8  Pain Type: Acute pain Pain Location: Rib cage Pain Orientation: Left Pain Descriptors / Indicators: Aching;Discomfort Pain Frequency: Intermittent Pain Onset: On-going  Patients Stated Pain Goal: 3 Pain Intervention(s): Medication (See eMAR)(norco given)    Therapy/Group: Individual Therapy  [name] 02/19/2019, 10:07 AM

## 2019-02-20 ENCOUNTER — Inpatient Hospital Stay (HOSPITAL_COMMUNITY): Payer: Medicare Other | Admitting: Occupational Therapy

## 2019-02-20 ENCOUNTER — Inpatient Hospital Stay (HOSPITAL_COMMUNITY): Payer: Medicare Other | Admitting: Physical Therapy

## 2019-02-20 NOTE — Progress Notes (Signed)
Occupational Therapy Session Note  Patient Details  Name: Krista Hodges MRN: [id number] Date of Birth: 06/16/1955  Today's Date: 02/20/2019 OT Individual Time: 1003-1100 and 1300-1413 OT Individual Time Calculation (min): 57 min and 73 min  Short Term Goals: Week 1:  OT Short Term Goal 1 (Week 1): Pt will perform LB dressing with mod A and use of AE as needed. OT Short Term Goal 2 (Week 1): Pt will perform toileting with min A for standing balance during hygiene and clothing management. OT Short Term Goal 3 (Week 1): Pt will maintain standing at sink for 5 minutes while performing grooming tasks at sink.  Skilled Therapeutic Interventions/Progress Updates:    Pt greeted in the w/c, wanting to wait until tomorrow for her shower but agreeable to sponge bathe at the sink today. Started with bathing/dressing completed sit<stand at the sink. Pt required Max A to manage TLSO. Min A for sit<stand and for dynamic standing balance at the sink during pericare and LB dressing tasks. Pt with posterior lean/LOB and unable to correct unassisted. Able to utilize figure 4 position to doff her gripper socks but needing A to wash feet for precaution adherence. Min A for donning brief as well. Increased time for problem solving required when threading R UE into her shirt sleeve. She then stood with Min A to complete oral care and handwashing with increased time and min vcs for sequencing. Ambulatory transfer completed to bed afterwards using RW with Min A and vcs for device mgt. Her spouse Gershon Mussel was present during session and OT demonstrated proper technique for doffing/donning TLSO. He did not want hands on practice. Supine<sit completed with Mod A with vcs for logroll technique. Pt remained in bed with all needs within reach and bed alarm set. Tx focus placed on ADL retraining, balance, sit<stands, and activity tolerance.    2nd Session 1:1 tx (73 min) Pt greeted in bed with no c/o pain. ADL needs met, she was  agreeable to spend tx off of the unit. After handwashing and donning her face mask, pt completed supine<sit with Min A and vcs for sequencing. After TLSO was donned, Mod A for stand pivot<w/c using RW with strong posterior lean during transfer. After she was escorted to gift shop, worked on anterior weight shifting in sitting and standing positions to look at jewelry, purses, stuffed animals, and socks. She stood with Min A using RW, Min A for balance with unilateral support on device, Min-Mod A with both hands removed from device. Pt standing for 8 minutes without rest while holding stuffed animals and activating the music-making animals with her Rt hand. Worked on Rt attention with w/c navigation, pt requiring step by step cues for self propulsion technique, how to maneuver around tight spaces and around obstacles on Rt side. Min A for navigating with HOH to facilitate larger, more efficient propulsions. Afterwards pt was returned to room via w/c. Stand pivot<bed completed with RW and Min A, improved balance during transfer. Once TLSO was removed, pt returned to bed with Mod A via logroll technique. Left her with all needs within reach and bed alarm set. Tx focus placed on NMR, activity tolerance, and balance.    Therapy Documentation Precautions:  Precautions Precautions: Back Required Braces or Orthoses: Spinal Brace Spinal Brace: Thoracolumbosacral orthotic Restrictions Weight Bearing Restrictions: No Pain: in Rt shoulder, RN made aware and bringing pt heating pad at end of session  Pain Assessment Pain Scale: 0-10 Pain Score: 8  Pain Type: Acute pain  Pain Location: Back Pain Orientation: Upper Pain Descriptors / Indicators: Aching;Discomfort Pain Frequency: Intermittent Pain Onset: On-going Patients Stated Pain Goal: 3 Pain Intervention(s): Medication (See eMAR)(norco given) ADL:       Therapy/Group: Individual Therapy  Karyss Frese A Jennavieve Arrick 02/20/2019, 12:42 PM

## 2019-02-20 NOTE — Progress Notes (Signed)
[facility] PHYSICAL MEDICINE & REHABILITATION PROGRESS NOTE   Subjective/Complaints:  Patient seen in physical therapy.  She had difficulty explaining what she just finished doing.  She had been on the NuStep machine.  Per PT was able to participate with this.  ROS- neg CP, SOB,  N/V/D  Objective:   No results found. No results for input(s): WBC, HGB, HCT, PLT in the last 72 hours. No results for input(s): NA, K, CL, CO2, GLUCOSE, BUN, CREATININE, CALCIUM in the last 72 hours.  Intake/Output Summary (Last 24 hours) at 02/20/2019 0830 Last data filed at 02/19/2019 1837 Gross per 24 hour  Intake 240 ml  Output --  Net 240 ml     Physical Exam: Vital Signs Blood pressure 127/67, pulse 63, temperature 98.2 F (36.8 C), resp. rate 18, height '5\' 5"'$  (1.651 m), weight 70.2 kg, SpO2 98 %.   General: No acute distress Mood and affect are appropriate Heart: Regular rate and rhythm no rubs murmurs or extra sounds Lungs: Clear to auscultation, breathing unlabored, no rales or wheezes Abdomen: Positive bowel sounds, soft nontender to palpation, nondistended Extremities: No clubbing, cyanosis, or edema Skin: No evidence of breakdown, no evidence of rash Neurologic: Cranial nerves II through XII intact, motor strength is 5/5 in bilateral deltoid, bicep, tricep, grip, hip flexor, knee extensors, ankle dorsiflexor and plantar flexor Sensory exam normal sensation to light touch and proprioception in bilateral upper and lower extremities Cerebellar exam mild dysmetria left greater than right upper extremity with finger-to-nose Musculoskeletal: Full range of motion in all 4 extremities. No joint swelling   Assessment/Plan: 1. Functional deficits secondary to RIght cerebellar met, endometrial primary  which require 3+ hours per day of interdisciplinary therapy in a comprehensive inpatient rehab setting.  Physiatrist is providing close team supervision and 24 hour management of active medical  problems listed below.  Physiatrist and rehab team continue to assess barriers to discharge/monitor patient progress toward functional and medical goals  Care Tool:  Bathing  Bathing activity did not occur: Refused Body parts bathed by patient: Right arm, Left arm, Abdomen, Chest, Front perineal area, Buttocks, Right upper leg, Left upper leg, Right lower leg, Left lower leg, Face         Bathing assist Assist Level: Minimal Assistance - Patient > 75%     Upper Body Dressing/Undressing Upper body dressing   What is the patient wearing?: Bra, Pull over shirt    Upper body assist Assist Level: Minimal Assistance - Patient > 75%    Lower Body Dressing/Undressing Lower body dressing      What is the patient wearing?: Incontinence brief     Lower body assist Assist for lower body dressing: Moderate Assistance - Patient 50 - 74%     Toileting Toileting    Toileting assist Assist for toileting: Moderate Assistance - Patient 50 - 74%     Transfers Chair/bed transfer  Transfers assist     Chair/bed transfer assist level: Moderate Assistance - Patient 50 - 74%     Locomotion Ambulation   Ambulation assist      Assist level: Moderate Assistance - Patient 50 - 74% Assistive device: No Device Max distance: 3'   Walk 10 feet activity   Assist  Walk 10 feet activity did not occur: Safety/medical concerns(decreased balance/strong posterior lean in standing)        Walk 50 feet activity   Assist Walk 50 feet with 2 turns activity did not occur: Safety/medical concerns(decreased balance/strong posterior lean in  standing)         Walk 150 feet activity   Assist Walk 150 feet activity did not occur: Safety/medical concerns(decreased balance/strong posterior lean in standing)         Walk 10 feet on uneven surface  activity   Assist Walk 10 feet on uneven surfaces activity did not occur: Safety/medical concerns(decreased balance/strong posterior  lean in standing)         Wheelchair     Assist   Type of Wheelchair: Manual    Wheelchair assist level: Minimal Assistance - Patient > 75% Max wheelchair distance: 51'    Wheelchair 50 feet with 2 turns activity    Assist    Wheelchair 50 feet with 2 turns activity did not occur: Safety/medical concerns   Assist Level: Minimal Assistance - Patient > 75%   Wheelchair 150 feet activity     Assist  Wheelchair 150 feet activity did not occur: Safety/medical concerns(decreased activity tolerance)       Blood pressure 127/67, pulse 63, temperature 98.2 F (36.8 C), resp. rate 18, height '5\' 5"'$  (1.651 m), weight 70.2 kg, SpO2 98 %.  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 Patient with slow Decadron taperdecreased to 2 mg twice daily x48 hours and stop -patient may shower        .CIR PT, OT  -ELOS/Goals: 1/19, min/CG assist goals PT, OT, supervision SLP 2. Antithrombotics: -DVT/anticoagulation:Subcutaneous heparin. Check vascular study -antiplatelet therapy: N/A 3. Pain Management:Hydrocodone as needed -back brace             -consider pre-treating for therapies 4.Zoloft 100 mg daily,:Wellbutrin 200 mg daily, BuSpar'15mg'$  twice daily -antipsychotic agents: N/A 5. Neuropsych: This patientis notcapable of making decisions on herown behalf. 6. Skin/Wound Care:Routine skin checks  remove staples from cervical-occipital incision >14d post op 7. Fluids/Electrolytes/Nutrition:Routine in and outs with follow-up chemistries 8. Hypertension. Avapro 300 mg daily. Monitor with increased mobility Vitals:   02/19/19 1928 02/20/19 0521  BP: 139/71 127/67  Pulse: (!)  56 63  Resp: 19 18  Temp: 98.2 F (36.8 C)   SpO2: 100% 98%  Blood pressure well controlled 1/8 9. Hypothyroidism. Synthroid 10. Hyperlipidemia. Crestor 11. Restless leg syndrome. Mirapex 0.5 mg nightly 12. History of renal cancer with left radical laparoscopic nephrectomy 05/21/2014. As well as history of endometrial adenocarcinoma 2017. Follow-up outpatient- Dr Earlie Server 13. Constipation. Laxative assistance 14.  HypoK mild KCL 49mq x 1   LOS: 4 days A FACE TO FACE EVALUATION WAS PERFORMED  ACharlett Blake1/09/2019, 8:30 AM

## 2019-02-20 NOTE — Progress Notes (Signed)
Physical Therapy Session Note  Patient Details  Name: Krista Hodges MRN: [id number] Date of Birth: 02/11/1956  Today's Date: 02/20/2019 PT Individual Time: 0805-0900 PT Individual Time Calculation (min): 55 min   Short Term Goals: Week 1:  PT Short Term Goal 1 (Week 1): Patient will perform basic transfers with min A using LRAD. PT Short Term Goal 2 (Week 1): Patient will perform dynamic standing balance with min A using LRAD. PT Short Term Goal 3 (Week 1): Patient will ambulate 100 feet with min A using LRAD.  Skilled Therapeutic Interventions/Progress Updates:   Pt received supine in bed and agreeable to PT. Supine>sit transfer with supervision assist from semirecumbent position. Min cues for reciprocal scooting to EOB with improved anterior weight shift.   WC mobility x 79f with supervision assist with moderate multimodal cues for improve ROM and attention the RUE in order to maintain straight path.   PT instructed pt in dynamic balance training to perform reciprocal foot taps on 4 inch step x 10 BLE + 8 BLE with visual cue on floor to maintain wide BOS. Min assist throughout for increased L weight shifting and BUE support on parallel bars. Sit<>stand with BUE pushing from WC, min assist and moderate cues for anterior weight shift to prevent r/posterior LOB.   Gait training with RW x 1262fwith min assist overall and min-mod cues for AD management, L weight shifting, and increased step length on the R. Noted improvement with turns on this day and ability to remain without RW>   nustep reciprocal movement training x 8 minutes with min cues for symmetry of movement and midline orientation while sitting upright. [facility] returned to room and [facility] and all needs met.          Therapy Documentation Precautions:  Precautions Precautions: Back Required Braces or Orthoses: Spinal Brace Spinal Brace: Thoracolumbosacral orthotic Restrictions Weight  Bearing Restrictions: No    Pain: Pain Assessment Pain Scale: 0-10 Pain Score: 5  Pain Type: Acute pain Pain Location: Abdomen Pain Orientation: Anterior Pain Descriptors / Indicators: Aching Pain Frequency: Intermittent Pain Onset: On-going Patients Stated Pain Goal: 3 Pain Intervention(s): Medication (See eMAR)(norco given prior to therapy. )    Therapy/Group: Individual Therapy  AuLorie Phenix/09/2019, 9:58 AM

## 2019-02-21 ENCOUNTER — Inpatient Hospital Stay (HOSPITAL_COMMUNITY): Payer: Medicare Other | Admitting: Occupational Therapy

## 2019-02-21 ENCOUNTER — Inpatient Hospital Stay (HOSPITAL_COMMUNITY): Payer: Medicare Other | Admitting: Physical Therapy

## 2019-02-21 NOTE — Progress Notes (Signed)
Physical Therapy Session Note  Patient Details  Name: [name] MRN: [id number] Date of Birth: June 02, 1955  Today's Date: 02/21/2019 PT Individual Time: 1300-1400 PT Individual Time Calculation (min): 60 min   Short Term Goals: Week 1:  PT Short Term Goal 1 (Week 1): Patient will perform basic transfers with min A using LRAD. PT Short Term Goal 2 (Week 1): Patient will perform dynamic standing balance with min A using LRAD. PT Short Term Goal 3 (Week 1): Patient will ambulate 100 feet with min A using LRAD.  Skilled Therapeutic Interventions/Progress Updates:   Pt received sitting in WC and agreeable to PT. Pt transported to day room. Gait training with RW x 52f, 124fand 2032fMin assist on the first and second bout with moderate cues for step length and AD management in turns. On the 3rd bout pt with significantly decreased midline orientation, problem solving and ability to perform  L weight shift, requiring Mod-max assist and max cues for safety, gait pattern and sequencing to prevent LOB. Dynamic balance training to perform reciprocal foot taps on 4 inch step with mod assist and step up x 5 BLE with min assist on the L and mod assist on the R due to poor gluteal activation. Lateral Reaching the L to force improved Weight bearing on the LLE, followed by bean bad toss to target. Visual feedback from mirror with poor use from pt as very distractable on this day. WC mobility x 72f43fth min assist and max cues for attention to and use of RUE. Patient returned to room and [facility] and all needs met.         Therapy Documentation Precautions:  Precautions Precautions: Back Required Braces or Orthoses: Spinal Brace Spinal Brace: Thoracolumbosacral orthotic Restrictions Weight Bearing Restrictions: No    Vital Signs: Therapy Vitals Temp: 98.1 F (36.7 C) Pulse Rate: 66 Resp: 16 BP: 115/68 Patient Position (if appropriate): Sitting Oxygen  Therapy SpO2: 100 % O2 Device: Room Air Pain:   denies    Therapy/Group: Individual Therapy  AustLorie Phenix/2021, 6:02 PM

## 2019-02-21 NOTE — Progress Notes (Signed)
Occupational Therapy Session Note  Patient Details  Name: Krista Hodges MRN: [id number] Date of Birth: 10/07/1955  Today's Date: 02/21/2019 OT Individual Time: 1000-1059 OT Individual Time Calculation (min): 59 min   Short Term Goals: Week 1:  OT Short Term Goal 1 (Week 1): Pt will perform LB dressing with mod A and use of AE as needed. OT Short Term Goal 2 (Week 1): Pt will perform toileting with min A for standing balance during hygiene and clothing management. OT Short Term Goal 3 (Week 1): Pt will maintain standing at sink for 5 minutes while performing grooming tasks at sink.  Skilled Therapeutic Interventions/Progress Updates:    Pt greeted in bed with no c/o pain. Agreeable to shower. Supine<sit completed with supervision. After TLSO was donned, Mod A for stand pivot<w/c using RW. Pt has posterior bias in standing, manual cues provided to correct. Min A for stand pivot<TTB using grab bar. She bathed while seated, utilizing lateral leans for hygiene and figure 4 for washing feet. Min vcs for precaution adherence and sequencing. Dressing afterwards was completed sit<stand at the sink. With instruction, pt fastened the front of her orthosis, including drawstrings. OT donned Teds and gripper socks with pt able to complete 3/3 components of donning pants. Min A for sit<stand and Min-Mod A for dynamic standing balance due to periods of increased posterior bias. She also exhibits a slight Lt lean in standing however has no LOB because of it. She stood for 5 minutes and 30 seconds while brushing and flossing teeth. Min A for balance at this time. Pt then reported she had bladder incontinence in brief. She completed hygiene with instruction and Min balance assist. OT then donned a new brief and assisted with clothing mgt. Pt returned to w/c and was set up for handwashing. Left her in w/c with all needs within reach, safety belt fastened, and wearing TLSO. Tx focus placed on ADL retraining, precaution  adherence during functional tasks, balance, and OOB tolerance.   Therapy Documentation Precautions:  Precautions Precautions: Back Required Braces or Orthoses: Spinal Brace Spinal Brace: Thoracolumbosacral orthotic Restrictions Weight Bearing Restrictions: No Vital Signs: Therapy Vitals Temp: 98.1 F (36.7 C) Pulse Rate: 66 Resp: 16 BP: 115/68 Patient Position (if appropriate): Sitting Oxygen Therapy SpO2: 100 % O2 Device: Room Air Pain:   ADL:        Therapy/Group: Individual Therapy  Tavarion Babington A Iley Breeden 02/21/2019, 3:47 PM

## 2019-02-21 NOTE — Plan of Care (Signed)
  Problem: Consults Goal: RH BRAIN INJURY PATIENT EDUCATION Description: Description: See Patient Education module for eduction specifics Outcome: Progressing   Problem: RH BLADDER ELIMINATION Goal: RH STG MANAGE BLADDER WITH ASSISTANCE Description: STG Manage Bladder With Mod I Assistance Outcome: Progressing Goal: RH STG MANAGE BLADDER WITH EQUIPMENT WITH ASSISTANCE Description: STG Manage Bladder With Equipment With mod I Assistance Outcome: Progressing   Problem: RH SKIN INTEGRITY Goal: RH STG ABLE TO PERFORM INCISION/WOUND CARE W/ASSISTANCE Description: STG Able To Perform Incision/Wound Care With min Assistance. Outcome: Progressing   Problem: RH SAFETY Goal: RH STG ADHERE TO SAFETY PRECAUTIONS W/ASSISTANCE/DEVICE Description: STG Adhere to Safety Precautions With cues and reminder.  Outcome: Progressing Goal: RH STG DECREASED RISK OF FALL WITH ASSISTANCE Description: STG Decreased Risk of Fall With cues and reminders Outcome: Progressing   Problem: RH COGNITION-NURSING Goal: RH STG USES MEMORY AIDS/STRATEGIES W/ASSIST TO PROBLEM SOLVE Description: STG Uses Memory Aids/Strategies With cues and reminders Outcome: Progressing   Problem: RH KNOWLEDGE DEFICIT BRAIN INJURY Goal: RH STG INCREASE KNOWLEDGE OF SELF CARE AFTER BRAIN INJURY Description: Pt will be able to adhere to medication regimen and safety precautions with mod I assist upon discharge.  Outcome: Progressing

## 2019-02-22 ENCOUNTER — Inpatient Hospital Stay (HOSPITAL_COMMUNITY): Payer: Medicare Other | Admitting: Occupational Therapy

## 2019-02-22 NOTE — Plan of Care (Signed)
  Problem: Consults Goal: RH BRAIN INJURY PATIENT EDUCATION Description: Description: See Patient Education module for eduction specifics Outcome: Progressing   Problem: RH BOWEL ELIMINATION Goal: RH STG MANAGE BOWEL WITH ASSISTANCE Description: STG Manage Bowel with Mod I Assistance. Outcome: Progressing Goal: RH STG MANAGE BOWEL W/MEDICATION W/ASSISTANCE Description: STG Manage Bowel with Medication with mod I Assistance. Outcome: Progressing   Problem: RH BLADDER ELIMINATION Goal: RH STG MANAGE BLADDER WITH ASSISTANCE Description: STG Manage Bladder With Mod I Assistance Outcome: Progressing Goal: RH STG MANAGE BLADDER WITH EQUIPMENT WITH ASSISTANCE Description: STG Manage Bladder With Equipment With mod I Assistance Outcome: Progressing   Problem: RH SKIN INTEGRITY Goal: RH STG ABLE TO PERFORM INCISION/WOUND CARE W/ASSISTANCE Description: STG Able To Perform Incision/Wound Care With min Assistance. Outcome: Progressing   Problem: RH SAFETY Goal: RH STG ADHERE TO SAFETY PRECAUTIONS W/ASSISTANCE/DEVICE Description: STG Adhere to Safety Precautions With cues and reminder.  Outcome: Progressing Goal: RH STG DECREASED RISK OF FALL WITH ASSISTANCE Description: STG Decreased Risk of Fall With cues and reminders Outcome: Progressing   Problem: RH COGNITION-NURSING Goal: RH STG USES MEMORY AIDS/STRATEGIES W/ASSIST TO PROBLEM SOLVE Description: STG Uses Memory Aids/Strategies With cues and reminders Outcome: Progressing   Problem: RH KNOWLEDGE DEFICIT BRAIN INJURY Goal: RH STG INCREASE KNOWLEDGE OF SELF CARE AFTER BRAIN INJURY Description: Pt will be able to adhere to medication regimen and safety precautions with mod I assist upon discharge.  Outcome: Progressing

## 2019-02-22 NOTE — Progress Notes (Signed)
Removed staples. Patient tolerated well. No complications noted at this time. Site is clean and dry with no drainage.  [place]

## 2019-02-22 NOTE — Progress Notes (Signed)
[place] PHYSICAL MEDICINE & REHABILITATION PROGRESS NOTE   Subjective/Complaints:  No issues overnite , pt recognizes me as Dr. but thought I did her surgery  ROS- neg CP, SOB,  N/V/D  Objective:   No results found. No results for input(s): WBC, HGB, HCT, PLT in the last 72 hours. No results for input(s): NA, K, CL, CO2, GLUCOSE, BUN, CREATININE, CALCIUM in the last 72 hours.  Intake/Output Summary (Last 24 hours) at 02/22/2019 1207 Last data filed at 02/22/2019 0900 Gross per 24 hour  Intake 360 ml  Output --  Net 360 ml     Physical Exam: Vital Signs Blood pressure 131/73, pulse (!) 58, temperature 97.9 F (36.6 C), resp. rate 18, height '5\' 5"'$  (1.651 m), weight 70.2 kg, SpO2 100 %.   General: No acute distress Mood and affect are appropriate Heart: Regular rate and rhythm no rubs murmurs or extra sounds Lungs: Clear to auscultation, breathing unlabored, no rales or wheezes Abdomen: Positive bowel sounds, soft nontender to palpation, nondistended Extremities: No clubbing, cyanosis, or edema Skin: No evidence of breakdown, no evidence of rash Neurologic: Cranial nerves II through XII intact, motor strength is 5/5 in bilateral deltoid, bicep, tricep, grip, hip flexor, knee extensors, ankle dorsiflexor and plantar flexor Sensory exam normal sensation to light touch and proprioception in bilateral upper and lower extremities Cerebellar exam mild dysmetria left greater than right upper extremity with finger-to-nose Musculoskeletal: Full range of motion in all 4 extremities. No joint swelling   Assessment/Plan: 1. Functional deficits secondary to RIght cerebellar met, endometrial primary  which require 3+ hours per day of interdisciplinary therapy in a comprehensive inpatient rehab setting.  Physiatrist is providing close team supervision and 24 hour management of active medical problems listed below.  Physiatrist and rehab team continue to assess barriers to  discharge/monitor patient progress toward functional and medical goals  Care Tool:  Bathing  Bathing activity did not occur: Refused Body parts bathed by patient: Right arm, Left arm, Abdomen, Chest, Front perineal area, Buttocks, Right upper leg, Left upper leg, Face, Right lower leg, Left lower leg   Body parts bathed by helper: Right lower leg, Left lower leg     Bathing assist Assist Level: Contact Guard/Touching assist     Upper Body Dressing/Undressing Upper body dressing   What is the patient wearing?: Pull over shirt, Orthosis    Upper body assist Assist Level: Minimal Assistance - Patient > 75%    Lower Body Dressing/Undressing Lower body dressing      What is the patient wearing?: Incontinence brief, Pants     Lower body assist Assist for lower body dressing: Minimal Assistance - Patient > 75%     Toileting Toileting Toileting Activity did not occur Landscape architect and hygiene only): Refused  Toileting assist Assist for toileting: Moderate Assistance - Patient 50 - 74%     Transfers Chair/bed transfer  Transfers assist     Chair/bed transfer assist level: Minimal Assistance - Patient > 75%     Locomotion Ambulation   Ambulation assist      Assist level: Minimal Assistance - Patient > 75% Assistive device: Walker-rolling Max distance: 125   Walk 10 feet activity   Assist  Walk 10 feet activity did not occur: Safety/medical concerns(decreased balance/strong posterior lean in standing)  Assist level: Minimal Assistance - Patient > 75% Assistive device: Walker-rolling   Walk 50 feet activity   Assist Walk 50 feet with 2 turns activity did not occur: Safety/medical concerns(decreased balance/strong  posterior lean in standing)  Assist level: Minimal Assistance - Patient > 75% Assistive device: Walker-rolling    Walk 150 feet activity   Assist Walk 150 feet activity did not occur: Safety/medical concerns(decreased balance/strong  posterior lean in standing)         Walk 10 feet on uneven surface  activity   Assist Walk 10 feet on uneven surfaces activity did not occur: Safety/medical concerns(decreased balance/strong posterior lean in standing)         Wheelchair     Assist   Type of Wheelchair: Manual    Wheelchair assist level: Supervision/Verbal cueing Max wheelchair distance: 75    Wheelchair 50 feet with 2 turns activity    Assist    Wheelchair 50 feet with 2 turns activity did not occur: Safety/medical concerns   Assist Level: Supervision/Verbal cueing   Wheelchair 150 feet activity     Assist  Wheelchair 150 feet activity did not occur: Safety/medical concerns(decreased activity tolerance)       Blood pressure 131/73, pulse (!) 58, temperature 97.9 F (36.6 C), resp. rate 18, height 5' 5" (1.651 m), weight 70.2 kg, SpO2 100 %.  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 Patient with slow Decadron taperdecreased to 2 mg twice daily x48 hours and stop -patient may shower        .CIR PT, OT  -ELOS/Goals: 1/19, min/CG assist goals PT, OT, supervision SLP 2. Antithrombotics: -DVT/anticoagulation:Subcutaneous heparin. Check vascular study -antiplatelet therapy: N/A 3. Pain Management:Hydrocodone as needed -back brace             -consider pre-treating for therapies 4.Zoloft 100 mg daily,:Wellbutrin 200 mg daily, BuSpar13m twice daily -antipsychotic agents: N/A 5. Neuropsych: This patientis notcapable of making decisions on herown behalf. 6. Skin/Wound Care:Routine skin checks  remove staples from cervical-occipital incision >14d post op 7.  Fluids/Electrolytes/Nutrition:Routine in and outs with follow-up chemistries 8. Hypertension. Avapro 300 mg daily. Monitor with increased mobility Vitals:   02/21/19 1415 02/22/19 0425  BP: 115/68 131/73  Pulse: 66 (!) 58  Resp: 16 18  Temp: 98.1 F (36.7 C) 97.9 F (36.6 C)  SpO2: 100% 100%  Blood pressure well controlled 1/10 9. Hypothyroidism. Synthroid 10. Hyperlipidemia. Crestor 11. Restless leg syndrome. Mirapex 0.5 mg nightly 12. History of renal cancer with left radical laparoscopic nephrectomy 05/21/2014. As well as history of endometrial adenocarcinoma 2017. Follow-up outpatient- Dr MEarlie Server13. Constipation. Laxative assistance 14.  HypoK mild KCL 344m x 1   LOS: 6 days A FACE TO FACE EVALUATION WAS PERFORMED  AnCharlett Blake/11/2019, 12:07 PM

## 2019-02-22 NOTE — Progress Notes (Signed)
Occupational Therapy Session Note  Patient Details  Name: Krista Hodges MRN: [id number] Date of Birth: 1955-12-30  Today's Date: 02/22/2019 OT Individual Time: 1105-1200 OT Individual Time Calculation (min): 55 min   Short Term Goals: Week 1:  OT Short Term Goal 1 (Week 1): Pt will perform LB dressing with mod A and use of AE as needed. OT Short Term Goal 2 (Week 1): Pt will perform toileting with min A for standing balance during hygiene and clothing management. OT Short Term Goal 3 (Week 1): Pt will maintain standing at sink for 5 minutes while performing grooming tasks at sink.  Skilled Therapeutic Interventions/Progress Updates:    Pt greeted in bed, denying pain. Suggested starting with toileting however pt refused. She also did not want to bathe today. Supine<sit completed with Max A via logroll technique. Once EOB to don TLSO, pt had a very difficult time with maintaining static sitting balance due to posterior bias. Provided heavy manual cuing and facilitation for anterior weight shift however pt was still unable to break posterior bias in sitting or when standing with RW. Attempted several sit<stands with device and Max A, however transferring was deemed unsafe. Therefore she completed stand pivot<w/c without RW with Max A for facilitating anterior weight shift and max vcs for sequencing. Pt then completed oral care and handwashing while standing at the sink. Pt required anywhere from CGA to Max A for balance due to episodes of increased posterior bias. Vcs for sequencing and problem solving during stated tasks. Transitioned to bedmaking tasks, with focus on functional cognition and anterior weight shifting to apply linen and pillows onto bed. Manual cuing to offset Lt lean when weight shifting forward. She was able to doff/don 3 pillowcases with min vcs. At end of session pt was agreeable to remain up in the w/c for lunch. Left her with all needs within reach and safety belt fastened,  wearing TLSO.     Therapy Documentation Precautions:  Precautions Precautions: Back Required Braces or Orthoses: Spinal Brace Spinal Brace: Thoracolumbosacral orthotic Restrictions Weight Bearing Restrictions: No Vital Signs: Therapy Vitals Temp: 98 F (36.7 C) Pulse Rate: 70 Resp: 16 BP: (!) 106/95 Patient Position (if appropriate): Sitting Oxygen Therapy SpO2: 100 % O2 Device: Room Air ADL:       Therapy/Group: Individual Therapy  Krista Hodges A [name] 02/22/2019, 4:15 PM

## 2019-02-23 ENCOUNTER — Inpatient Hospital Stay (HOSPITAL_COMMUNITY): Payer: Medicare Other | Admitting: Occupational Therapy

## 2019-02-23 ENCOUNTER — Inpatient Hospital Stay (HOSPITAL_COMMUNITY): Payer: Medicare Other | Admitting: Physical Therapy

## 2019-02-23 LAB — BASIC METABOLIC PANEL
Anion gap: 7 (ref 5–15)
BUN: 15 mg/dL (ref 8–23)
CO2: 29 mmol/L (ref 22–32)
Calcium: 10.1 mg/dL (ref 8.9–10.3)
Chloride: 100 mmol/L (ref 98–111)
Creatinine, Ser: 1.02 mg/dL — ABNORMAL HIGH (ref 0.44–1.00)
GFR calc Af Amer: >60 mL/min (ref >60)
GFR calc non Af Amer: 58 mL/min — ABNORMAL LOW (ref >60)
Glucose, Bld: 93 mg/dL (ref 70–99)
Potassium: 3.7 mmol/L (ref 3.5–5.1)
Sodium: 136 mmol/L (ref 135–145)

## 2019-02-23 NOTE — Progress Notes (Signed)
Occupational Therapy Session Note  Patient Details  Name: Krista Hodges MRN: [id number] Date of Birth: [birth year]  Today's Date: 02/23/2019 OT Individual Time: 1415-1510 OT Individual Time Calculation (min): 55 min    Short Term Goals: Week 1:  OT Short Term Goal 1 (Week 1): Pt will perform LB dressing with mod A and use of AE as needed. OT Short Term Goal 2 (Week 1): Pt will perform toileting with min A for standing balance during hygiene and clothing management. OT Short Term Goal 3 (Week 1): Pt will maintain standing at sink for 5 minutes while performing grooming tasks at sink.  Skilled Therapeutic Interventions/Progress Updates:    Upon entering the room, pt in bathroom with NT and husband present in the room. Pt reports 5/10 pain in lower back this session but declines medication. Pt required mod A for stand pivot transfer and total A for hygiene and clothing management. Pt seated in wheelchair at sink for grooming tasks with cuing and min A for anterior weight shift to obtain needed items on sink. OT assisted pt via wheelchair to gym. Pt standing with mod A from wheelchair for dynavision task. Pt's needing min - max A for manual facilitation of anterior weight shift during standing task secondary to posterior lean. Pt also has right gaze preference this session and required 5 seconds on average to hit targets on R side of board but an average of over 21 seconds to locate targets on far right and even longer for far right inferior quadrant. Pt also oriented to self only this session. She reported date as being " Sept 19, 2012". OT assisted pt back to room via wheelchair secondary to fatigue. Pt ambulating 10  Feet in room with RW and max cuing for advancement of RW, visually scanning, anterior weight shift, and standing inside of RW. Once pt seated on EOB, brace removed, and min A for sit >supine. OT assisted pt with repositioning for comfort. Bed alarm activated and call bell within reach.    Therapy Documentation Precautions:  Precautions Precautions: Back Required Braces or Orthoses: Spinal Brace Spinal Brace: Thoracolumbosacral orthotic Restrictions Weight Bearing Restrictions: No General:   Vital Signs: Therapy Vitals Pulse Rate: 65 Resp: 20 BP: (!) 111/58 Patient Position (if appropriate): Sitting Oxygen Therapy SpO2: 100 % O2 Device: Room Air   Therapy/Group: Individual Therapy  Gypsy Decant 02/23/2019, 5:05 PM

## 2019-02-23 NOTE — Progress Notes (Signed)
[facility]rist[facility] Hod[facility] PHYSICAL MEDICINE & REHABILITATION PROGRESS NOTE   Subjective/Complaints: No complaints this morning. Denies pain. States that she is moving bowels regularly.  Sleeping well at night.   ROS- neg CP, SOB,  N/V/D  Objective:   No results found. No results for input(s): WBC, HGB, HCT, PLT in the last 72 hours. Recent Labs    02/23/19 0620  NA 136  K 3.7  CL 100  CO2 29  GLUCOSE 93  BUN 15  CREATININE 1.02*  CALCIUM 10.1    Intake/Output Summary (Last 24 hours) at 02/23/2019 1003 Last data filed at 02/22/2019 1905 Gross per 24 hour  Intake 360 ml  Output --  Net 360 ml     Physical Exam: Vital Signs Blood pressure (!) 135/55, pulse 64, temperature 98.5 F (36.9 C), temperature source Oral, resp. rate 16, height _0  (1.651 m), weight 70.2 kg, SpO2 99 %.   General: No acute distress Mood and affect are appropriate Heart: Regular rate and rhythm no rubs murmurs or extra sounds Lungs: Clear to auscultation, breathing unlabored, no rales or wheezes Abdomen: Positive bowel sounds, soft nontender to palpation, nondistended Extremities: No clubbing, cyanosis, or edema Skin: No evidence of breakdown, no evidence of rash Neurologic: Cranial nerves II through XII intact, motor strength is 5/5 in bilateral deltoid, bicep, tricep, grip, hip flexor, knee extensors, ankle dorsiflexor and plantar flexor Sensory exam normal sensation to light touch and proprioception in bilateral upper and lower extremities Cerebellar exam mild dysmetria left greater than right upper extremity with finger-to-nose Musculoskeletal: Full range of motion in all 4 extremities. No joint swelling.   Assessment/Plan: 1. Functional deficits secondary to RIght cerebellar met, endometrial primary  which require 3+ hours per day of interdisciplinary therapy in a comprehensive inpatient rehab setting.  Physiatrist is providing close team supervision and 24 hour management of active medical problems  listed below.  Physiatrist and rehab team continue to assess barriers to discharge/monitor patient progress toward functional and medical goals  Care Tool:  Bathing  Bathing activity did not occur: Refused Body parts bathed by patient: Right arm, Left arm, Abdomen, Chest, Front perineal area, Buttocks, Right upper leg, Left upper leg, Face, Right lower leg, Left lower leg   Body parts bathed by helper: Right lower leg, Left lower leg     Bathing assist Assist Level: Contact Guard/Touching assist     Upper Body Dressing/Undressing Upper body dressing   What is the patient wearing?: Pull over shirt, Orthosis    Upper body assist Assist Level: Moderate Assistance - Patient 50 - 74%    Lower Body Dressing/Undressing Lower body dressing      What is the patient wearing?: Incontinence brief, Pants     Lower body assist Assist for lower body dressing: Minimal Assistance - Patient > 75%     Toileting Toileting Toileting Activity did not occur Landscape architect and hygiene only): Refused  Toileting assist Assist for toileting: Minimal Assistance - Patient > 75%     Transfers Chair/bed transfer  Transfers assist     Chair/bed transfer assist level: Minimal Assistance - Patient > 75%     Locomotion Ambulation   Ambulation assist      Assist level: Minimal Assistance - Patient > 75% Assistive device: Walker-rolling Max distance: 125   Walk 10 feet activity   Assist  Walk 10 feet activity did not occur: Safety/medical concerns(decreased balance/strong posterior lean in standing)  Assist level: Minimal Assistance - Patient > 75% Assistive device: Walker-rolling  Walk 50 feet activity   Assist Walk 50 feet with 2 turns activity did not occur: Safety/medical concerns(decreased balance/strong posterior lean in standing)  Assist level: Minimal Assistance - Patient > 75% Assistive device: Walker-rolling    Walk 150 feet activity   Assist Walk 150 feet  activity did not occur: Safety/medical concerns(decreased balance/strong posterior lean in standing)         Walk 10 feet on uneven surface  activity   Assist Walk 10 feet on uneven surfaces activity did not occur: Safety/medical concerns(decreased balance/strong posterior lean in standing)         Wheelchair     Assist   Type of Wheelchair: Manual    Wheelchair assist level: Supervision/Verbal cueing Max wheelchair distance: 75    Wheelchair 50 feet with 2 turns activity    Assist    Wheelchair 50 feet with 2 turns activity did not occur: Safety/medical concerns   Assist Level: Supervision/Verbal cueing   Wheelchair 150 feet activity     Assist  Wheelchair 150 feet activity did not occur: Safety/medical concerns(decreased activity tolerance)       Blood pressure (!) 135/55, pulse 64, temperature 98.5 F (36.9 C), temperature source Oral, resp. rate 16, height _0  (1.651 m), weight 70.2 kg, SpO2 99 %.  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 Patient with slow Decadron taperdecreased to 2 mg twice daily x48 hours and stop -patient may shower        CIR PT, OT -ELOS/Goals: 1/19, min/CG assist goals PT, OT, supervision SLP 2. Antithrombotics: -DVT/anticoagulation:Subcutaneous heparin. Check vascular study -antiplatelet therapy: N/A 3. Pain Management:Hydrocodone as needed -back brace             -consider pre-treating for therapies 4.Zoloft 100 mg daily,:Wellbutrin 200 mg daily, BuSpar49m twice daily -antipsychotic agents: N/A 5. Neuropsych: This patientis notcapable of making decisions on herown behalf. 6. Skin/Wound Care:Routine skin  checks  remove staples from cervical-occipital incision >14d post op  1/11: Tolerated staple removal well yesterday.  7. Fluids/Electrolytes/Nutrition:Routine in and outs with follow-up chemistries 8. Hypertension. Avapro 300 mg daily. Monitor with increased mobility Vitals:   02/22/19 2008 02/23/19 0507  BP: 120/78 (!) 135/55  Pulse: 71 64  Resp: 18 16  Temp: 97.8 F (36.6 C) 98.5 F (36.9 C)  SpO2: 100% 99%  Blood pressure well controlled 1/10, 1/11 9. Hypothyroidism. Synthroid 10. Hyperlipidemia. Crestor 11. Restless leg syndrome. Mirapex 0.5 mg nightly 12. History of renal cancer with left radical laparoscopic nephrectomy 05/21/2014. As well as history of endometrial adenocarcinoma 2017. Follow-up outpatient- Dr MEarlie Server13. Constipation. Laxative assistance 14.  HypoK mild KCL 363m x 1   1/11: K+ stable.   LOS: 7 days A FACE TO FACE EVALUATION WAS PERFORMED  Deyani Hegarty P Lynnetta Tom 02/23/2019, 10:03 AM

## 2019-02-23 NOTE — Progress Notes (Signed)
Physical Therapy Session Note  Patient Details  Name: Krista Hodges MRN: [id number] Date of Birth: 06/03/1955  Today's Date: 02/23/2019 PT Individual Time: 1107-1207 PT Individual Time Calculation (min): 60 min   Short Term Goals: Week 1:  PT Short Term Goal 1 (Week 1): Patient will perform basic transfers with min A using LRAD. PT Short Term Goal 2 (Week 1): Patient will perform dynamic standing balance with min A using LRAD. PT Short Term Goal 3 (Week 1): Patient will ambulate 100 feet with min A using LRAD.  Skilled Therapeutic Interventions/Progress Updates: Pt presented in w/c agreeable to therapy. Pt stating 8/10 back pain but was recently premedicated. Pt transported to day room for energy conservation. Performed stand pivot transfer to mat with modA with RW with significant posterior lean noted and requiring mod multimodal cues for sequencing. Pt noted to have R gaze preference this session which had not been noted by this PTA upon last encounter. Upon sitting pt required max tactile cues for improved positioning with improved midline orientation. Performed sitting reaching with horseshoes to encourage sitting up to midline x 6.Upon completion pt was able to keep midline without cues however increased R posteriorlateral lean noted with fatigue. Performed STS with mirror feedback with mod multimodal cues to increase forward lean with pt able to maintain statically. Participated in x 2 bouts off horseshoes with emphasis on reaching forward and improving wt on forefeet. Pt Participated in gait training 9f x 2 with modA. Pt required verbal cues for increasing step length, and foot clearance with manual facilitation to shift to R. Pt faded to maxA when stepping posteriorly. Pt transferred back to w/c modA with poor carryover of midline orientation and transported back to room. Pt left in w/c at end of session with belt alarm on, call bell within reach and needs met.      Therapy  Documentation Precautions:  Precautions Precautions: Back Required Braces or Orthoses: Spinal Brace Spinal Brace: Thoracolumbosacral orthotic Restrictions Weight Bearing Restrictions: No General:   Vital Signs: Therapy Vitals Pulse Rate: 65 Resp: 20 BP: (!) 111/58 Patient Position (if appropriate): Sitting Oxygen Therapy SpO2: 100 % O2 Device: Room Air    Therapy/Group: Individual Therapy  Jayvin Hurrell  Jatziry Wechter, PTA  02/23/2019, 3:32 PM

## 2019-02-23 NOTE — Progress Notes (Signed)
Occupational Therapy Session Note  Patient Details  Name: Krista Hodges MRN: [id number] Date of Birth: July 18, 1955  Today's Date: 02/23/2019 OT Individual Time: [id number] OT Individual Time Calculation (min): 56 min   Short Term Goals: Week 1:  OT Short Term Goal 1 (Week 1): Pt will perform LB dressing with mod A and use of AE as needed. OT Short Term Goal 2 (Week 1): Pt will perform toileting with min A for standing balance during hygiene and clothing management. OT Short Term Goal 3 (Week 1): Pt will maintain standing at sink for 5 minutes while performing grooming tasks at sink.     Skilled Therapeutic Interventions/Progress Updates:    Pt greeted in bed, asleep, easily woken. Agreeable to session and requesting to use the restroom. Supine<sit completed with Mod A and vcs for sequencing due to back precautions. While EOB, pt needing max manual cuing for anterior weight shift due to posterior bias/LOB when TLSO was applied. Stand pivot<w/c using RW completed with Mod-Max A with facilitation for walker mgt, going towards Lt side. Stand pivot<toilet using grab bar completed with Min A. Pt able to complete clothing mgt with Min balance assist. She had +bladder void, completed hygiene while seated with cue to initiate. Her pants were wet due to bladder incontinence in brief, changed pants with Min A for standing balance and vcs for sequencing. While seated at the sink, pt completed handwashing. She asked OT if toothpaste or faucet lever was soap, question if this is related to vision vs cognition. Min-Mod A for stand pivot<bed using RW going towards Rt with improved ability for pt to self manage the walker. Worked on trunk control and anterior weight shifting while she ate breakfast EOB. Min manual facilitation initially with pt then able to maintain sitting balance with close supervision assist for first 5 minutes. Afterwards she needed mod manual cues to address Lt lean, remained eating EOB for ~18  minutes in total. Pt did well with responding to verbal and manual cues to correct her posture today. Stand pivot<w/c completed with Mod-Max A using RW once again going towards Lt side. While seated she doffed shirt and donned a clean one for the day. Continued education regarding how to apply TLSO in the front, with pt still needing Max A. At end of session pt remained in the w/c with all needs within reach and safety belt fastened.   Pt disoriented to time, place, and situation during tx.     Note that we put her glasses on during meal, however pt still inquired where her fork was when it was right in front of her and pt reporting she didn't see the remaining fruit in her cup. She would benefit from further visual assessment while wearing her glasses  Therapy Documentation Precautions:  Precautions Precautions: Back Required Braces or Orthoses: Spinal Brace Spinal Brace: Thoracolumbosacral orthotic Restrictions Weight Bearing Restrictions: No Vital Signs: Therapy Vitals Pulse Rate: 65 Resp: 20 BP: (!) 111/58 Patient Position (if appropriate): Sitting Oxygen Therapy SpO2: 100 % O2 Device: Room Air Pain: c/o HA near end of session, RN made aware that she would like some pain medicine    ADL:        Therapy/Group: Individual Therapy  Krista Hodges A Krista Hodges 02/23/2019, 3:58 PM

## 2019-02-23 NOTE — Plan of Care (Signed)
  Problem: Consults Goal: [facility] BRAIN INJURY PATIENT EDUCATION Description: Description: See Patient Education module for eduction specifics 02/23/2019 1105 by Romana Juniper, LPN Outcome: Progressing 02/23/2019 1105 by Romana Juniper, LPN Outcome: Progressing   Problem: RH BOWEL ELIMINATION Goal: RH STG MANAGE BOWEL WITH ASSISTANCE Description: STG Manage Bowel with Mod I Assistance. 02/23/2019 1105 by Romana Juniper, LPN Outcome: Progressing 02/23/2019 1105 by Romana Juniper, LPN Outcome: Progressing Goal: RH STG MANAGE BOWEL W/MEDICATION W/ASSISTANCE Description: STG Manage Bowel with Medication with mod I Assistance. 02/23/2019 1105 by Romana Juniper, LPN Outcome: Progressing 02/23/2019 1105 by Romana Juniper, LPN Outcome: Progressing   Problem: RH BLADDER ELIMINATION Goal: RH STG MANAGE BLADDER WITH ASSISTANCE Description: STG Manage Bladder With Mod I Assistance 02/23/2019 1105 by Romana Juniper, LPN Outcome: Progressing 02/23/2019 1105 by Romana Juniper, LPN Outcome: Progressing Goal: RH STG MANAGE BLADDER WITH EQUIPMENT WITH ASSISTANCE Description: STG Manage Bladder With Equipment With mod I Assistance 02/23/2019 1105 by Romana Juniper, LPN Outcome: Progressing 02/23/2019 1105 by Romana Juniper, LPN Outcome: Progressing   Problem: RH SKIN INTEGRITY Goal: RH STG ABLE TO PERFORM INCISION/WOUND CARE W/ASSISTANCE Description: STG Able To Perform Incision/Wound Care With min Assistance. 02/23/2019 1105 by Romana Juniper, LPN Outcome: Progressing 02/23/2019 1105 by Romana Juniper, LPN Outcome: Progressing   Problem: RH SAFETY Goal: RH STG ADHERE TO SAFETY PRECAUTIONS W/ASSISTANCE/DEVICE Description: STG Adhere to Safety Precautions With cues and reminder.  02/23/2019 1105 by Romana Juniper, LPN Outcome: Progressing 02/23/2019 1105 by Romana Juniper, LPN Outcome: Progressing Goal: RH STG DECREASED RISK OF FALL WITH ASSISTANCE Description:  STG Decreased Risk of Fall With cues and reminders 02/23/2019 1105 by Romana Juniper, LPN Outcome: Progressing 02/23/2019 1105 by Romana Juniper, LPN Outcome: Progressing   Problem: RH COGNITION-NURSING Goal: RH STG USES MEMORY AIDS/STRATEGIES W/ASSIST TO PROBLEM SOLVE Description: STG Uses Memory Aids/Strategies With cues and reminders 02/23/2019 1105 by Romana Juniper, LPN Outcome: Progressing 02/23/2019 1105 by Romana Juniper, LPN Outcome: Progressing   Problem: RH KNOWLEDGE DEFICIT BRAIN INJURY Goal: RH STG INCREASE KNOWLEDGE OF SELF CARE AFTER BRAIN INJURY Description: Pt will be able to adhere to medication regimen and safety precautions with mod I assist upon discharge.  02/23/2019 1105 by Romana Juniper, LPN Outcome: Progressing 02/23/2019 1105 by Romana Juniper, LPN Outcome: Progressing

## 2019-02-24 ENCOUNTER — Inpatient Hospital Stay (HOSPITAL_COMMUNITY): Payer: Medicare Other | Admitting: Occupational Therapy

## 2019-02-24 ENCOUNTER — Inpatient Hospital Stay (HOSPITAL_COMMUNITY): Payer: Medicare Other | Admitting: Physical Therapy

## 2019-02-24 NOTE — Progress Notes (Signed)
Occupational Therapy Session Note  Patient Details  Name: Krista Hodges MRN: [id number] Date of Birth: 1956-02-07  Today's Date: 02/24/2019 OT Individual Time: [id number] Individual Time Calculation (min): 25 min  and Today's Date: 02/24/2019 OT Missed Time: 35 Minutes Missed Time Reason: Patient fatigue;Patient ill (comment)(nausea)   Short Term Goals: Week 1:  OT Short Term Goal 1 (Week 1): Pt will perform LB dressing with mod A and use of AE as needed. OT Short Term Goal 2 (Week 1): Pt will perform toileting with min A for standing balance during hygiene and clothing management. OT Short Term Goal 3 (Week 1): Pt will maintain standing at sink for 5 minutes while performing grooming tasks at sink.  Skilled Therapeutic Interventions/Progress Updates:    Upon entering the room, pt supine in bed with breakfast tray in front of her. Pt does not report pain but reports, " I just don't feel good. Eugenia Pancoast." OT asking further questions and pt reports feeling nauseated. OT checking vitals which all appear to be WNLs at this time. OT oriented pt to time, location, and situation as she is oriented to self only this morning. OT also reviewing therapy schedule with her. Pt declines OT intervention secondary to feeling unwell. We do discuss plans to shower in afternoon session in hopes that she will be feeling better. OT assisted with repositioning, bed alarm activated, and call bell within reach.   Therapy Documentation Precautions:  Precautions Precautions: Back Required Braces or Orthoses: Spinal Brace Spinal Brace: Thoracolumbosacral orthotic Restrictions Weight Bearing Restrictions: No General: General OT Amount of Missed Time: 35 Minutes Vital Signs: Therapy Vitals Temp: 98.1 F (36.7 C) Pulse Rate: 63 Resp: 19 BP: 132/76 Patient Position (if appropriate): Lying Oxygen Therapy SpO2: 98 % O2 Device: Room Air Pain: Pain Assessment Pain Scale: 0-10 Pain Score: 4  Pain Type:  Acute pain Pain Location: Leg Pain Orientation: Left Pain Descriptors / Indicators: Aching;Discomfort Pain Frequency: Constant Pain Onset: On-going Patients Stated Pain Goal: 1 Pain Intervention(s): Repositioned   Therapy/Group: Individual Therapy  Gypsy Decant 02/24/2019, 8:43 AM

## 2019-02-24 NOTE — Progress Notes (Signed)
Physical Therapy Session Note  Patient Details  Name: Krista Hodges MRN: [id number] Date of Birth: 1955-10-12  Today's Date: 02/24/2019 PT Individual Time: 8473-0856 PT Individual Time Calculation (min): 70 min   Short Term Goals: Week 1:  PT Short Term Goal 1 (Week 1): Patient will perform basic transfers with min A using LRAD. PT Short Term Goal 2 (Week 1): Patient will perform dynamic standing balance with min A using LRAD. PT Short Term Goal 3 (Week 1): Patient will ambulate 100 feet with min A using LRAD.  Skilled Therapeutic Interventions/Progress Updates: Pt presented in bed agreeable to therapy however stating pain in LLE 8/10. Nsg notified and provided pain meds. Pt performed supine to sit at EOB with modA via log roll technique to maintain spinal precautions. Pt grimacing during transitional movements. PTA propped pt's LUE on pillows in sitting which improved pt's R lateral lean. PTA donned TLSO total A. Performed STS modA with heavy posterior lean with  Pt indicating increased "wooziness" upon standing. Pt returned to bed and vitals checked 150/84 (98) HR 70. Re-attempted STS with stand pivot to w/c with pt having poor initiation of stepping and pt unable to clear floor with BLE during turn. Pt then transported to ortho gym and participated in [place] with focus on L quadrant. Pt required modA for scanning, and reaching for target. Pt was able to verbalize target and attempt to reach with approx 50% accuracy. Pt would notably reach for light but attempt to press button consistently one row over (both vertically and horizontally). Pt then transported to day room and participated in Cybex Kinetron 80cm/sec in 1 min bouts. Pt transported back to room at end of session and left with belt alarm on, call bell within reach and needs met.      Therapy Documentation Precautions:  Precautions Precautions: Back Required Braces or Orthoses: Spinal Brace Spinal Brace:  Thoracolumbosacral orthotic Restrictions Weight Bearing Restrictions: No General:   Vital Signs: Therapy Vitals Temp: 98 F (36.7 C) Pulse Rate: 63 Resp: 14 BP: 139/81 Patient Position (if appropriate): Lying Oxygen Therapy SpO2: 95 % O2 Device: Room Air    Therapy/Group: Individual Therapy  Cartha Rotert  [facility], PTA  02/24/2019, 4:33 PM

## 2019-02-24 NOTE — Progress Notes (Signed)
Occupational Therapy Weekly Progress Note  Patient Details  Name: Krista Hodges MRN: [id number] Date of Birth: 02/03/1956  Beginning of progress report period: February 17, 2019 End of progress report period: February 26, 2019   Patient has met 0 of 3 short term goals.  Pt was making progress with therapeutic intervention but has had significant functional decline over several days. Recent MRI results shows significant medical concerns at this time. Pt has been on bed rest for OT intervention this morning and is now on 15/7 schedule. Pt's level of assist is fluctuating throughout the day and she needs more assistance when fatigued. OT will continue to follow.  Patient continues to demonstrate the following deficits: muscle weakness, decreased cardiorespiratoy endurance, decreased coordination, decreased attention, decreased awareness, decreased problem solving, decreased safety awareness and decreased memory and decreased sitting balance, decreased standing balance, decreased postural control and decreased balance strategies and therefore will continue to benefit from skilled OT intervention to enhance overall performance with BADL and Reduce care partner burden.  Patient not progressing toward long term goals.  Continue plan of care.  OT Short Term Goals Week 1:  OT Short Term Goal 1 (Week 1): Pt will perform LB dressing with mod A and use of AE as needed. OT Short Term Goal 1 - Progress (Week 1): Not met OT Short Term Goal 2 (Week 1): Pt will perform toileting with min A for standing balance during hygiene and clothing management. OT Short Term Goal 2 - Progress (Week 1): Not met OT Short Term Goal 3 (Week 1): Pt will maintain standing at sink for 5 minutes while performing grooming tasks at sink. OT Short Term Goal 3 - Progress (Week 1): Not met Week 2:  OT Short Term Goal 1 (Week 2): STGs=LTGs secondary to upcoming discharge  Skilled Therapeutic Interventions/Progress Updates:       Therapy Documentation Precautions:  Precautions Precautions: Back Required Braces or Orthoses: Spinal Brace Spinal Brace: Thoracolumbosacral orthotic Restrictions Weight Bearing Restrictions: No General: General OT Amount of Missed Time: 35 Minutes Vital Signs: Therapy Vitals Temp: 98.1 F (36.7 C) Pulse Rate: 63 Resp: 19 BP: 132/76 Patient Position (if appropriate): Lying Oxygen Therapy SpO2: 98 % O2 Device: Room Air Pain: Pain Assessment Pain Scale: 0-10 Pain Score: 4  Pain Type: Acute pain Pain Location: Leg Pain Orientation: Left Pain Descriptors / Indicators: Aching;Discomfort Pain Frequency: Constant Pain Onset: On-going Patients Stated Pain Goal: 1 Pain Intervention(s): Repositioned   Therapy/Group: Individual Therapy  Gypsy Decant 02/24/2019, 8:46 AM

## 2019-02-24 NOTE — Plan of Care (Signed)
  Problem: Consults Goal: RH BRAIN INJURY PATIENT EDUCATION Description: Description: See Patient Education module for eduction specifics Outcome: Progressing   Problem: RH SKIN INTEGRITY Goal: RH STG ABLE TO PERFORM INCISION/WOUND CARE W/ASSISTANCE Description: STG Able To Perform Incision/Wound Care With min Assistance. Outcome: Progressing   Problem: RH COGNITION-NURSING Goal: RH STG USES MEMORY AIDS/STRATEGIES W/ASSIST TO PROBLEM SOLVE Description: STG Uses Memory Aids/Strategies With cues and reminders Outcome: Progressing

## 2019-02-24 NOTE — Progress Notes (Signed)
Occupational Therapy Session Note  Patient Details  Name: Krista Hodges MRN: [id number] Date of Birth: 02-May-1955  Today's Date: 02/24/2019 OT Individual Time: 1400-1426 OT Individual Time Calculation (min): 26 min  and Today's Date: 02/24/2019 OT Missed Time: 30 Minutes Missed Time Reason: Patient ill (comment);Pain   Short Term Goals: Week 1:  OT Short Term Goal 1 (Week 1): Pt will perform LB dressing with mod A and use of AE as needed. OT Short Term Goal 2 (Week 1): Pt will perform toileting with min A for standing balance during hygiene and clothing management. OT Short Term Goal 3 (Week 1): Pt will maintain standing at sink for 5 minutes while performing grooming tasks at sink.  Skilled Therapeutic Interventions/Progress Updates:    Upon entering the room, pt supine in bed and clutching head and L hip. Pt verbalized headache and not feeling well. RN notified. Pt also reports dizziness but BP is 145/80 this session. RN arrives to assess and bring medications as well. OT having pt visually scan and obtain items from L side of the room with increased time. Pt declines further OT intervention and does not want to exit the bed secondary to feeling unwell.Bed alarm activated and call bell within reach upon exiting the room.  Therapy Documentation Precautions:  Precautions Precautions: Back Required Braces or Orthoses: Spinal Brace Spinal Brace: Thoracolumbosacral orthotic Restrictions Weight Bearing Restrictions: No General: General OT Amount of Missed Time: 30 Minutes Vital Signs: Therapy Vitals Temp: 98 F (36.7 C) Pulse Rate: 63 Resp: 14 BP: 139/81 Patient Position (if appropriate): Lying Oxygen Therapy SpO2: 95 % O2 Device: Room Air   Therapy/Group: Individual Therapy  Gypsy Decant 02/24/2019, 2:55 PM

## 2019-02-24 NOTE — Progress Notes (Signed)
PHYSICAL MEDICINE & REHABILITATION PROGRESS NOTE   Subjective/Complaints:   Feels kind of "yucky" today.  Some nausea, no vomiting , no abd pain , no cough, no sore throat , no dysuria   ROS- neg CP, SOB,  N/V/D  Objective:   No results found. No results for input(s): WBC, HGB, HCT, PLT in the last 72 hours. Recent Labs    02/23/19 0620  NA 136  K 3.7  CL 100  CO2 29  GLUCOSE 93  BUN 15  CREATININE 1.02*  CALCIUM 10.1    Intake/Output Summary (Last 24 hours) at 02/24/2019 8938 Last data filed at 02/24/2019 0600 Gross per 24 hour  Intake 200 ml  Output --  Net 200 ml     Physical Exam: Vital Signs Blood pressure 132/76, pulse 63, temperature 98.1 F (36.7 C), resp. rate 19, height 5' 5" (1.651 m), weight 70.2 kg, SpO2 98 %.   General: No acute distress Mood and affect are appropriate Heart: Regular rate and rhythm no rubs murmurs or extra sounds Lungs: Clear to auscultation, breathing unlabored, no rales or wheezes Abdomen: Positive bowel sounds, soft nontender to palpation, nondistended Extremities: No clubbing, cyanosis, or edema Skin: No evidence of breakdown, no evidence of rash Neurologic: Cranial nerves II through XII intact, motor strength is 5/5 in bilateral deltoid, bicep, tricep, grip, hip flexor, knee extensors, ankle dorsiflexor and plantar flexor Sensory exam normal sensation to light touch and proprioception in bilateral upper and lower extremities Cerebellar exam mild dysmetria left greater than right upper extremity with finger-to-nose- per haps slighly more dysmetria on Left today  Musculoskeletal: Full range of motion in all 4 extremities. No joint swelling.   Assessment/Plan: 1. Functional deficits secondary to RIght cerebellar met, endometrial primary  which require 3+ hours per day of interdisciplinary therapy in a comprehensive inpatient rehab setting.  Physiatrist is providing close team supervision and 24 hour management of  active medical problems listed below.  Physiatrist and rehab team continue to assess barriers to discharge/monitor patient progress toward functional and medical goals  Care Tool:  Bathing  Bathing activity did not occur: Refused Body parts bathed by patient: Right arm, Left arm, Abdomen, Chest, Front perineal area, Buttocks, Right upper leg, Left upper leg, Face, Right lower leg, Left lower leg   Body parts bathed by helper: Right lower leg, Left lower leg     Bathing assist Assist Level: Contact Guard/Touching assist     Upper Body Dressing/Undressing Upper body dressing   What is the patient wearing?: Pull over shirt, Orthosis    Upper body assist Assist Level: Moderate Assistance - Patient 50 - 74%    Lower Body Dressing/Undressing Lower body dressing      What is the patient wearing?: Incontinence brief, Pants     Lower body assist Assist for lower body dressing: Minimal Assistance - Patient > 75%     Toileting Toileting Toileting Activity did not occur Landscape architect and hygiene only): Refused  Toileting assist Assist for toileting: Moderate Assistance - Patient 50 - 74%     Transfers Chair/bed transfer  Transfers assist     Chair/bed transfer assist level: Minimal Assistance - Patient > 75%     Locomotion Ambulation   Ambulation assist      Assist level: Minimal Assistance - Patient > 75% Assistive device: Walker-rolling Max distance: 125   Walk 10 feet activity   Assist  Walk 10 feet activity did not occur: Safety/medical concerns(decreased balance/strong posterior lean in  standing)  Assist level: Minimal Assistance - Patient > 75% Assistive device: Walker-rolling   Walk 50 feet activity   Assist Walk 50 feet with 2 turns activity did not occur: Safety/medical concerns(decreased balance/strong posterior lean in standing)  Assist level: Minimal Assistance - Patient > 75% Assistive device: Walker-rolling    Walk 150 feet  activity   Assist Walk 150 feet activity did not occur: Safety/medical concerns(decreased balance/strong posterior lean in standing)         Walk 10 feet on uneven surface  activity   Assist Walk 10 feet on uneven surfaces activity did not occur: Safety/medical concerns(decreased balance/strong posterior lean in standing)         Wheelchair     Assist   Type of Wheelchair: Manual    Wheelchair assist level: Supervision/Verbal cueing Max wheelchair distance: 75    Wheelchair 50 feet with 2 turns activity    Assist    Wheelchair 50 feet with 2 turns activity did not occur: Safety/medical concerns   Assist Level: Supervision/Verbal cueing   Wheelchair 150 feet activity     Assist  Wheelchair 150 feet activity did not occur: Safety/medical concerns(decreased activity tolerance)       Blood pressure 132/76, pulse 63, temperature 98.1 F (36.7 C), resp. rate 19, height 5' 5" (1.651 m), weight 70.2 kg, SpO2 98 %.  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 Patient with slow Decadron taperdecreased to 2 mg twice daily x48 hours and stop -team conf in am         CIR PT, OT -ELOS/Goals: 1/19, min/CG assist goals PT, OT, supervision SLP 2. Antithrombotics: -DVT/anticoagulation:Subcutaneous heparin. Check vascular study -antiplatelet therapy: N/A 3. Pain Management:Hydrocodone as needed -back brace             -consider pre-treating for therapies 4.Zoloft 100 mg daily,:Wellbutrin 200 mg daily, BuSpar15mg twice daily -antipsychotic agents: N/A 5. Neuropsych: This patientis notcapable of making decisions on herown behalf. 6. Skin/Wound Care:Routine skin  checks  incison looks good post staple removal  7. Fluids/Electrolytes/Nutrition:Routine in and outs with follow-up chemistries 8. Hypertension. Avapro 300 mg daily. Monitor with increased mobility Vitals:   02/23/19 2010 02/24/19 0612  BP: 127/74 132/76  Pulse: 68 63  Resp: 19 19  Temp: 98 F (36.7 C) 98.1 F (36.7 C)  SpO2: 99% 98%  Blood pressure well controlled 1/12 9. Hypothyroidism. Synthroid 10. Hyperlipidemia. Crestor 11. Restless leg syndrome. Mirapex 0.5 mg nightly 12. History of renal cancer with left radical laparoscopic nephrectomy 05/21/2014. As well as history of endometrial adenocarcinoma 2017. Follow-up outpatient- Dr Mohammed 13. Constipation. Laxative assistance 14.  HypoK mild KCL 30meq x 1   1/11: K+ stable.   LOS: 8 days A FACE TO FACE EVALUATION WAS PERFORMED   E  02/24/2019, 8:21 AM    

## 2019-02-24 NOTE — Progress Notes (Signed)
Patient states she "feels funny", but can not describe why or what is making her feel funny. Patient also slurring words, which is not new. Vitals within normal range. Stated she had severe migraine and was clenching head, also clenching left leg complaining of pain. Gave pain meds and called Krista Hodges, [place]. No new orders. Will continue plan of care. [name]

## 2019-02-24 NOTE — Progress Notes (Signed)
Physical Therapy Weekly Progress Note  Patient Details  Name: Krista Hodges MRN: [id number] Date of Birth: 09/17/1955  Beginning of progress report period: February 17, 2019 End of progress report period: February 24, 2019  Today's Date: 02/24/2019   Patient has met 0 of 3 short term goals.  Pt is making slower than anticipated gains during current course of therapy following functional decline over the past several days. Pt may require min/modA for bed mobility and minA for sit to stand. Pt continues to demonstrate significant posterior lean limiting progression with balance and gait may vary from minA to modA due to posterior lean.   Patient continues to demonstrate the following deficits muscle weakness and muscle joint tightness, impaired timing and sequencing, abnormal tone, unbalanced muscle activation, decreased coordination and decreased motor planning and decreased initiation, decreased attention, decreased awareness and decreased safety awareness and therefore will continue to benefit from skilled PT intervention to increase functional independence with mobility.  Patient progressing toward long term goals..  Continue plan of care.  PT Short Term Goals Week 1:  PT Short Term Goal 1 (Week 1): Patient will perform basic transfers with min A using LRAD. PT Short Term Goal 1 - Progress (Week 1): Not met PT Short Term Goal 2 (Week 1): Patient will perform dynamic standing balance with min A using LRAD. PT Short Term Goal 2 - Progress (Week 1): Not met PT Short Term Goal 3 (Week 1): Patient will ambulate 100 feet with min A using LRAD. PT Short Term Goal 3 - Progress (Week 1): Not met Week 2:  PT Short Term Goal 1 (Week 2): Pt will perform bed mobility with min assist PT Short Term Goal 2 (Week 2): Pt will transfer to WC with min assist PT Short Term Goal 3 (Week 2): Pt will ambulate 20ft with mod assist and LRAD  Skilled Therapeutic Interventions/Progress Updates:      Therapy  Documentation Precautions:  Precautions Precautions: Back Required Braces or Orthoses: Spinal Brace Spinal Brace: Thoracolumbosacral orthotic Restrictions Weight Bearing Restrictions: No Vital Signs: Therapy Vitals Temp: 98 F (36.7 C) Pulse Rate: 63 Resp: 14 BP: 139/81 Patient Position (if appropriate): Lying Oxygen Therapy SpO2: 95 % O2 Device: Room Air   Therapy/Group: Individual Therapy  Rosita DeChalus 02/24/2019, 5:08 PM   

## 2019-02-25 ENCOUNTER — Inpatient Hospital Stay (HOSPITAL_COMMUNITY): Payer: Medicare Other | Admitting: Physical Therapy

## 2019-02-25 ENCOUNTER — Inpatient Hospital Stay (HOSPITAL_COMMUNITY): Payer: Medicare Other

## 2019-02-25 MED ORDER — DEXAMETHASONE 2 MG PO TABS
2.0000 mg | ORAL_TABLET | Freq: Two times a day (BID) | ORAL | Status: DC
  Administered 2019-02-25: 2 mg via ORAL
  Filled 2019-02-25: qty 1

## 2019-02-25 MED ORDER — DEXAMETHASONE 4 MG PO TABS
4.0000 mg | ORAL_TABLET | Freq: Four times a day (QID) | ORAL | Status: DC
  Administered 2019-02-25 – 2019-02-27 (×9): 4 mg via ORAL
  Filled 2019-02-25 (×9): qty 1

## 2019-02-25 NOTE — Progress Notes (Signed)
Physical Therapy Session Note  Patient Details  Name: Krista Hodges MRN: [id number] Date of Birth: 01-17-1956  Today's Date: 02/25/2019 PT Individual Time: 0905-1000 and 1032-1102  PT Individual Time Calculation (min): 55 min and 30 min  Short Term Goals: Week 1:  PT Short Term Goal 1 (Week 1): Patient will perform basic transfers with min A using LRAD. PT Short Term Goal 2 (Week 1): Patient will perform dynamic standing balance with min A using LRAD. PT Short Term Goal 3 (Week 1): Patient will ambulate 100 feet with min A using LRAD. Week 2:     Skilled Therapeutic Interventions/Progress Updates: Pt presented in bed lethargic throughout session but agreeable to therapy. Pt stating decreased pain in LLE today. Pt noted to be incontinent of bladder. Performed rolling L/R supervision to change brief and perform peri-care. Performed supine to sit with use of bed features and minA with heavy R posteriorlateral lean initially upon sitting which required modA to correct. PTA donned shirt modA for threading arms and pulling over head. TLSO donned total A. PTA threaded pants maxA. Performed STS modA with RW with manual facilitation for increased anterior wt shifting. PTA pulled pants over hips total A. Performed stand pivot transfer to w/c modA with increased time. Pt transported to day room for energy conservation. Performed midline orientation with use of mirror feedback with pt requiring minA to turn head to midline and was able to maintain for approx 30 sec with max multimodal cues. Pt then participated in opening pill bottles reaching on high/low table. Pt required maxA for reaching to L with RUE at times. Pt was able to open x 1 pill bottle (flip top), but unable to motor plan how to open second bottle despite being told to lift lid (vs screw top). Pt transported back to room at end of session and remained in w/c with belt alarm on and call bell within reach.   Tx2: Pt presented in w/c stating  increased HA. Vitals checked BP 139/80 (94) and PTA notifying nursing that pt requesting pain meds. Session focused on w/c mobility with pt propelled x 15ft with modA due to limited shoulder ROM limiting propulsion. Pt required multimodal cues for using L/R UE to maintain straight trajectory and PTA providing verbal cues to have pt look forward vs R gaze. Upon return to room pt required modA STS from w/c but with heavy posterior lean which pt was unable to correct and pt unable to initiate stepping to pivot to bed. Performed stand pivot with pt holding onto PTA with maxA to transfer to bed and maxA for sit to supine. Pt repositioned to comfort and left with call bell within reach bed alarm on.      Therapy Documentation Precautions:  Precautions Precautions: Back Required Braces or Orthoses: Spinal Brace Spinal Brace: Thoracolumbosacral orthotic Restrictions Weight Bearing Restrictions: No General:   Vital Signs:  Pain: Pain Assessment Pain Scale: 0-10 Pain Score: 0-No pain Mobility:   Locomotion :    Trunk/Postural Assessment :    Balance:   Exercises:   Other Treatments:      Therapy/Group: Individual Therapy  Gordy Goar 02/25/2019, 12:49 PM

## 2019-02-25 NOTE — Progress Notes (Signed)
Team Conference Report to Patient/Family  Team Conference discussion was reviewed with the Son; [name], including goals, any changes in plan of care and target discharge date. Reviewed current medical status and tests ordered. Son expressed understanding and are in agreement with medication review/revisions and tests ordered. The patient has a target discharge date of 03/03/19 which was extended to 03/06/19 due to medical changes. Family education set up for 03/02/19 with son and patient's husband.  Krista Hodges 02/25/2019, 12:10 PM

## 2019-02-25 NOTE — Progress Notes (Signed)
Physical Therapy Session Note  Patient Details  Name: Krista Hodges MRN: [id number] Date of Birth: [birth year]  Today's Date: 02/25/2019 PT Individual Time: 1330-1345 PT Individual Time Calculation (min): 15 min   Short Term Goals: Week 1:  PT Short Term Goal 1 (Week 1): Patient will perform basic transfers with min A using LRAD. PT Short Term Goal 2 (Week 1): Patient will perform dynamic standing balance with min A using LRAD. PT Short Term Goal 3 (Week 1): Patient will ambulate 100 feet with min A using LRAD.  Skilled Therapeutic Interventions/Progress Updates:   Pt received supine in bed, noted to still have mask in place following CT.  With significant effort, pt was able to be aroused, but unable/unwilling to open eyes. Short, one word answers to all questions with mild slurring of speech noted. PT attempted to have pt perform bed mobility to come to EOB, but pt unable to remain aroused long enough to initiate rolling in bed. Pt stated "no" when asked if she would get out of bed. Left supine in bed with call bell in reach and all needs met.      Therapy Documentation Precautions:  Precautions Precautions: Back Required Braces or Orthoses: Spinal Brace Spinal Brace: Thoracolumbosacral orthotic Restrictions Weight Bearing Restrictions: No General: PT Amount of Missed Time (min): 30 Minutes PT Missed Treatment Reason: Patient fatigue   Therapy/Group: Individual Therapy  [name] 02/25/2019, 1:52 PM

## 2019-02-25 NOTE — Progress Notes (Addendum)
[place] PHYSICAL MEDICINE & REHABILITATION PROGRESS NOTE   Subjective/Complaints:   Per RN more lethargic today than last week.  Per PT, balance worse with increased posterior lean   ROS- neg CP, SOB,  N/V/D  Objective:   No results found. No results for input(s): WBC, HGB, HCT, PLT in the last 72 hours. Recent Labs    02/23/19 0620  NA 136  K 3.7  CL 100  CO2 29  GLUCOSE 93  BUN 15  CREATININE 1.02*  CALCIUM 10.1    Intake/Output Summary (Last 24 hours) at 02/25/2019 0916 Last data filed at 02/25/2019 0907 Gross per 24 hour  Intake 240 ml  Output --  Net 240 ml     Physical Exam: Vital Signs Blood pressure (!) 141/73, pulse 64, temperature 97.9 F (36.6 C), resp. rate 18, height '5\' 5"'$  (1.651 m), weight 70.2 kg, SpO2 98 %.   General: No acute distress Mood and affect are appropriate Heart: Regular rate and rhythm no rubs murmurs or extra sounds Lungs: Clear to auscultation, breathing unlabored, no rales or wheezes Abdomen: Positive bowel sounds, soft nontender to palpation, nondistended Extremities: No clubbing, cyanosis, or edema Skin: No evidence of breakdown, no evidence of rash Neurologic: Cranial nerves II through XII intact, motor strength is 5/5 in bilateral deltoid, bicep, tricep, grip, hip flexor, knee extensors, ankle dorsiflexor and plantar flexor Sensory exam normal sensation to light touch and proprioception in bilateral upper and lower extremities Cerebellar exam mild dysmetria left greater than right upper extremity with finger-to-nose- per haps slighly more dysmetria on Left today  Musculoskeletal: Full range of motion in all 4 extremities. No joint swelling.   Assessment/Plan: 1. Functional deficits secondary to RIght cerebellar met, endometrial primary  which require 3+ hours per day of interdisciplinary therapy in a comprehensive inpatient rehab setting.  Physiatrist is providing close team supervision and 24 hour management of active  medical problems listed below.  Physiatrist and rehab team continue to assess barriers to discharge/monitor patient progress toward functional and medical goals  Care Tool:  Bathing  Bathing activity did not occur: Refused Body parts bathed by patient: Right arm, Left arm, Abdomen, Chest, Front perineal area, Buttocks, Right upper leg, Left upper leg, Face, Right lower leg, Left lower leg   Body parts bathed by helper: Right lower leg, Left lower leg     Bathing assist Assist Level: Contact Guard/Touching assist     Upper Body Dressing/Undressing Upper body dressing   What is the patient wearing?: Pull over shirt, Orthosis    Upper body assist Assist Level: Moderate Assistance - Patient 50 - 74%    Lower Body Dressing/Undressing Lower body dressing      What is the patient wearing?: Incontinence brief, Pants     Lower body assist Assist for lower body dressing: Minimal Assistance - Patient > 75%     Toileting Toileting Toileting Activity did not occur Landscape architect and hygiene only): Refused  Toileting assist Assist for toileting: Moderate Assistance - Patient 50 - 74%     Transfers Chair/bed transfer  Transfers assist     Chair/bed transfer assist level: Minimal Assistance - Patient > 75%     Locomotion Ambulation   Ambulation assist      Assist level: Minimal Assistance - Patient > 75% Assistive device: Walker-rolling Max distance: 125   Walk 10 feet activity   Assist  Walk 10 feet activity did not occur: Safety/medical concerns(decreased balance/strong posterior lean in standing)  Assist level: Minimal  Assistance - Patient > 75% Assistive device: Walker-rolling   Walk 50 feet activity   Assist Walk 50 feet with 2 turns activity did not occur: Safety/medical concerns(decreased balance/strong posterior lean in standing)  Assist level: Minimal Assistance - Patient > 75% Assistive device: Walker-rolling    Walk 150 feet  activity   Assist Walk 150 feet activity did not occur: Safety/medical concerns(decreased balance/strong posterior lean in standing)         Walk 10 feet on uneven surface  activity   Assist Walk 10 feet on uneven surfaces activity did not occur: Safety/medical concerns(decreased balance/strong posterior lean in standing)         Wheelchair     Assist   Type of Wheelchair: Manual    Wheelchair assist level: Supervision/Verbal cueing Max wheelchair distance: 75    Wheelchair 50 feet with 2 turns activity    Assist    Wheelchair 50 feet with 2 turns activity did not occur: Safety/medical concerns   Assist Level: Supervision/Verbal cueing   Wheelchair 150 feet activity     Assist  Wheelchair 150 feet activity did not occur: Safety/medical concerns(decreased activity tolerance)       Blood pressure (!) 141/73, pulse 64, temperature 97.9 F (36.6 C), resp. rate 18, height '5\' 5"'$  (1.651 m), weight 70.2 kg, SpO2 98 %.  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 Patient with slow Decadron taperdecreased to 2 mg twice daily x48 hours and stop -Pt with decline in balance and coordination as well as nausea, will repeat CT head to see if there is increased edema , hemmorhage or new mass , if any changes will notify neurosurg Discussed with husband  decadron '2mg'$  BID       CIR PT, OT -ELOS/Goals: 1/19, min/CG assist goals PT, OT, supervision SLP 2. Antithrombotics: -DVT/anticoagulation:Subcutaneous heparin. Check vascular study -antiplatelet therapy: N/A 3. Pain Management:Hydrocodone as needed -back brace             -consider pre-treating for therapies 4.Zoloft 100 mg daily,:Wellbutrin  200 mg daily, BuSpar'15mg'$  twice daily -antipsychotic agents: N/A 5. Neuropsych: This patientis notcapable of making decisions on herown behalf. 6. Skin/Wound Care:Routine skin checks  incison looks good post staple removal  7. Fluids/Electrolytes/Nutrition:Routine in and outs with follow-up chemistries 8. Hypertension. Avapro 300 mg daily. Monitor with increased mobility Vitals:   02/24/19 1950 02/25/19 0513  BP: 135/74 (!) 141/73  Pulse: 62 64  Resp: 18 18  Temp: 97.9 F (36.6 C) 97.9 F (36.6 C)  SpO2: 97% 98%  Blood pressure well controlled 1/12 9. Hypothyroidism. Synthroid 10. Hyperlipidemia. Crestor 11. Restless leg syndrome. Mirapex 0.5 mg nightly 12. History of renal cancer with left radical laparoscopic nephrectomy 05/21/2014. As well as history of endometrial adenocarcinoma 2017. Follow-up outpatient- Dr Earlie Server 13. Constipation. Laxative assistance 14.  HypoK mild KCL 62mq x 1   1/11: K+ stable.   LOS: 9 days A FACE TO FACE EVALUATION WAS PERFORMED  ACharlett Blake1/13/2021, 9:16 AM

## 2019-02-25 NOTE — Plan of Care (Signed)
  Problem: Consults Goal: RH BRAIN INJURY PATIENT EDUCATION Description: Description: See Patient Education module for eduction specifics Outcome: Progressing   Problem: RH BOWEL ELIMINATION Goal: RH STG MANAGE BOWEL WITH ASSISTANCE Description: STG Manage Bowel with Mod I Assistance. Outcome: Progressing Flowsheets (Taken 02/25/2019 1623) STG: Pt will manage bowels with assistance: 2-Maximum assistance Goal: RH STG MANAGE BOWEL W/MEDICATION W/ASSISTANCE Description: STG Manage Bowel with Medication with mod I Assistance. Outcome: Progressing Flowsheets (Taken 02/25/2019 1623) STG: Pt will manage bowels with medication with assistance: 2-Maximum assistance   Problem: RH BLADDER ELIMINATION Goal: RH STG MANAGE BLADDER WITH ASSISTANCE Description: STG Manage Bladder With Mod I Assistance Outcome: Progressing Flowsheets (Taken 02/25/2019 1623) STG: Pt will manage bladder with assistance: 2-Maximum assistance Goal: RH STG MANAGE BLADDER WITH EQUIPMENT WITH ASSISTANCE Description: STG Manage Bladder With Equipment With mod I Assistance Outcome: Progressing Flowsheets (Taken 02/25/2019 1623) STG: Pt will manage bladder with equipment with assistance: 2-Maximum assistance   Problem: RH SKIN INTEGRITY Goal: RH STG ABLE TO PERFORM INCISION/WOUND CARE W/ASSISTANCE Description: STG Able To Perform Incision/Wound Care With min Assistance. Outcome: Progressing Flowsheets (Taken 02/25/2019 1623) STG: Pt will be able to perform incision/wound care with assistance: 3-Moderate assistance   Problem: RH SAFETY Goal: RH STG ADHERE TO SAFETY PRECAUTIONS W/ASSISTANCE/DEVICE Description: STG Adhere to Safety Precautions With cues and reminder.  Outcome: Progressing Flowsheets (Taken 02/25/2019 1623) STG:Pt will adhere to safety precautions with assistance/device: 3-Moderate assistance Goal: RH STG DECREASED RISK OF FALL WITH ASSISTANCE Description: STG Decreased Risk of Fall With cues and  reminders Outcome: Progressing Flowsheets (Taken 02/25/2019 1623) [id number] risk of fall  with assistance/device: 3-Moderate assistance   Problem: RH COGNITION-NURSING Goal: RH STG USES MEMORY AIDS/STRATEGIES W/ASSIST TO PROBLEM SOLVE Description: STG Uses Memory Aids/Strategies With cues and reminders Outcome: Progressing   Problem: RH KNOWLEDGE DEFICIT BRAIN INJURY Goal: RH STG INCREASE KNOWLEDGE OF SELF CARE AFTER BRAIN INJURY Description: Pt will be able to adhere to medication regimen and safety precautions with mod I assist upon discharge.  Outcome: Progressing

## 2019-02-25 NOTE — Progress Notes (Signed)
Occupational Therapy Session Note  Patient Details  Name: Krista Hodges MRN: [id number] Date of Birth: 05-Sep-1955  Today's Date: 02/25/2019 OT Individual Time: 1400-1410 OT Individual Time Calculation (min): 10 min  and Today's Date: 02/25/2019 OT Missed Time: 50 Minutes Missed Time Reason: Patient ill (comment) Pt fatigued and declines additional tx   Short Term Goals: Week 1:  OT Short Term Goal 1 (Week 1): Pt will perform LB dressing with mod A and use of AE as needed. OT Short Term Goal 2 (Week 1): Pt will perform toileting with min A for standing balance during hygiene and clothing management. OT Short Term Goal 3 (Week 1): Pt will maintain standing at sink for 5 minutes while performing grooming tasks at sink.  Skilled Therapeutic Interventions/Progress Updates:    1:1. Pt received in bed very lethargic and with slurred speech. Pt repositioned for comfort rolling with MAX A/scooted to [facility] with MAX A. Gaze down and to the right. Pt cell phone ringing but pt unaware. Placed phone in hand and movement ataxic attempting to bring phone to face. Pt too slow to answer phone. Pt closes eyes and states, "no" when asked if wanted to sit EOB. Pt missed 50 min d/t fatigue/change in status.  Therapy Documentation Precautions:  Precautions Precautions: Back Required Braces or Orthoses: Spinal Brace Spinal Brace: Thoracolumbosacral orthotic Restrictions Weight Bearing Restrictions: No General: General OT Amount of Missed Time: 50 Minutes PT Missed Treatment Reason: Patient fatigue Vital Signs: Therapy Vitals Temp: 97.7 F (36.5 C) Pulse Rate: 72 Resp: 18 BP: (!) 143/80 Patient Position (if appropriate): Lying Oxygen Therapy SpO2: 96 % O2 Device: Room Air Pain:   ADL:   Vision   Perception    Praxis   Exercises:   Other Treatments:     Therapy/Group: Individual Therapy  Tonny Branch 02/25/2019, 3:32 PM

## 2019-02-25 NOTE — Patient Care Conference (Signed)
Inpatient RehabilitationTeam Conference and Plan of Care Update Date: 02/25/2019   Time: 10:10 AM    Patient Name: Krista Hodges      Medical Record Number: [id number]  Date of Birth: September 30, 1955 Sex: Female         Room/Bed: 4W23C/4W23C-01 Payor Info: Payor: Theme park manager MEDICARE / Plan: [facility] MEDICARE / Product Type: *No Product type* /    Admit Date/Time:  02/16/2019  4:35 PM  Primary Diagnosis:  Cerebellar tumor [facility])  Patient Active Problem List   Diagnosis Date Noted  . History of memory loss   . 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 History of renal cell cancer   . Status post nephrectomy   . Endometrial adenocarcinoma ([place])   . Chronic fatigue   . Stage 3 chronic kidney disease   . Leucocytosis   . Dementia ([place]) 02/11/2019  . Brain tumor ([place]) 02/03/2019  . Hypokalemia 02/03/2019  . Hypothyroidism 02/03/2019  . Papillary thyroid carcinoma ([place]) 05/05/2017  . Tremor 01/31/2017  . Left renal mass 05/21/2014  . Idiopathic acute pancreatitis   . Urinary tract infectious disease   . Pancreatic mass   . Other acute pancreatitis   . Left kidney mass   . Acute pancreatitis 04/03/2014  . PVC (premature ventricular contraction) 10/20/2013  . Dyspnea 10/20/2013  . Allergic rhinitis 10/16/2013  . Anxiety state 10/16/2013  . Enthesopathy of hip 10/16/2013  . Cervical radiculitis 10/16/2013  . 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 CFIDS (chronic fatigue and immune dysfunction syndrome) ([place]) 10/16/2013  . Inflammation of hand joint 10/16/2013  . Atypical migraine 10/16/2013  . Excessive sweating, local 10/16/2013  . HLD (hyperlipidemia) 10/16/2013  . Depression, major, single episode, in partial remission ([place]) 10/16/2013  . Amnesia 10/16/2013  . Adiposity 10/16/2013  . Arthritis, degenerative 10/16/2013  . Restless leg 10/16/2013  . Adaptation reaction 10/16/2013  . Thrombophlebitis of superficial veins of lower extremity 10/16/2013  . Essential (primary) hypertension  10/16/2013  . Avitaminosis D 10/16/2013  . Awareness of heartbeats 09/16/2013  . Premature complex, ventricular 09/16/2013    Expected Discharge Date: Expected Discharge Date: 03/06/19  Team Members Present: Physician leading conference: Dr. Alysia Penna Social Worker Present: Lennart Pall, LCSW Nurse Present: Blair Heys, RN; Dorien Chihuahua, RN Case Manager: Karene Fry, RN PT Present: Barrie Folk, PT OT Present: Darleen Crocker, OT SLP Present: Charolett Bumpers, SLP PPS Coordinator present : Gunnar Fusi, Novella Olive, PT     Current Status/Progress Goal Weekly Team Focus  Bowel/Bladder   continent with episodes of incontinencesl LBM: 01/11  maintain regular bowel pattern  assist with tolieting needs prn   Swallow/Nutrition/ Hydration             ADL's   Steady assist bathing at shower level, Mod A UB/LB dressing, Mod A toileting, Min A stand pivot toilet transfers  supervision for grooming and UB self care. CGA for all other goals  pt/family education general strengthening and endurance, postural control, precaution adherence, ADL retraining   Mobility   mod-max assist following funcitonal decline over the past few days  CGA-supervision assist overall LRAD  Family education. safety. neuromotor re-education. improved balance and midline orientation   Communication             Safety/Cognition/ Behavioral Observations            Pain   c/o pain to upper back; prn norco  pain level <6/10  assess pain QS and prn   Skin   surgical incision head  posterior OTA, clean dry  maintain skin intergrity  assess skin QS and prn    Rehab Goals Patient on target to meet rehab goals: Yes *See Care Plan and progress notes for long and short-term goals.     Barriers to Discharge  Current Status/Progress Possible Resolutions Date Resolved   Nursing                  PT                    OT                  SLP                SW     Spouse managed medications, cooking,  cleaning and finances prior to admission. Patient with premorbid dementia/depressive disorder with sundowning and STMD/LTMD requiring assistance prior to admission.          Discharge Planning/Teaching Needs:  Home with spouse who can assist, daughter + daughter in law to assist as needed  TBD   Team Discussion: More lethargic, balance issues, ataxic, nausea, ?intracerebral edema, will recheck CT head.  RN sleepy, fatigue, cont/inc, nausea, med given.  OT nausea, headache, R gaze preference, post lean severe, goals S.  PT mod/max gait, mod transfers, w/c 15-20' max A, goals CGA.  No SLP, baseline dementia.   Revisions to Treatment Plan: N/A     Medical Summary Current Status: increase nausea , and lethargy Weekly Focus/Goal: repeat CT to assess decline in function  Barriers to Discharge: Medical stability;Pending chemo/radiation   Possible Resolutions to Barriers: see above   Continued Need for Acute Rehabilitation Level of Care: The patient requires daily medical management by a physician with specialized training in physical medicine and rehabilitation for the following reasons: Direction of a multidisciplinary physical rehabilitation program to maximize functional independence : Yes Medical management of patient stability for increased activity during participation in an intensive rehabilitation regime.: Yes Analysis of laboratory values and/or radiology reports with any subsequent need for medication adjustment and/or medical intervention. : Yes   I attest that I was present, lead the team conference, and concur with the assessment and plan of the team.   Retta Diones 02/26/2019, 10:36 AM   Team conference was held via web/ teleconference due to [place] - 19

## 2019-02-25 NOTE — Progress Notes (Signed)
Patient complained about "feeling funny" on day shift. Seemed extra drowsy at the beginning of night shift. Woke up nauseas, vomited green emesis. PO zofran 4mg  was given. Will continue plan of care. Call bell at bedside.

## 2019-02-25 NOTE — Progress Notes (Signed)
  NEUROSURGERY PROGRESS NOTE   Received call from [place] with CIR regarding patient. Patient is well known to Dr Kathyrn Sheriff. Patient is s/p suboccipital craniotomy for resection of right cerebellar tumor 02/04/2019. She had a rather uncomplicated hospital course with the exception of tachy / Brady episodes for which Cardiology was consulted - no intervention. She was discharged to CIR on 02/16/2019.  She was doing well up until today when it was noted that she had worsening balance, coordination and slurred speech.  A CT scan of her head was obtained. This revealed  enlargement of the previously noted left cerebellar mass that was not resected.  There is moderate edema in the cerebellum.  There is resultant obstructive hydrocephalus.  Also noted small areas of hemorrhage which in metastatic deposits in the left basal ganglia.  Came by to evaluate the patient.  She is resting comfortably. She awakens to voice. She is unable to provide any significant history. Does have slurred speech. ?mild right facial droop. Follows commands. Moves all extremities well. Nonfocal Mild drift. Decreased coordination with FNF Incision: healing well.  A/P: Imaging reviewed with Dr Kathyrn Sheriff. Certainly noted is worsening edema, ventriculomegaly, ?enlarging left cerebellar lesion. We will obtain MRI w/wo contrast to further assess this left cerebellar lesion. Will increase decadron to 4mg  q 6. Could conceivably place VP shunt as a short term option, however if these lesions have truly increase in size over the course of a 3-4 week period, the prognosis is not favorable. I discussed this with Irving Burton with CIR. He will consult palliative care. Nothing needs to be done as of yet. Will review MRI and follow up in the am. Will discuss with family after MRI is completed.  Ferne Reus, PA-C [facility] [employer]

## 2019-02-26 ENCOUNTER — Inpatient Hospital Stay (HOSPITAL_COMMUNITY): Payer: Medicare Other

## 2019-02-26 ENCOUNTER — Inpatient Hospital Stay (HOSPITAL_COMMUNITY): Payer: Medicare Other | Admitting: Physical Therapy

## 2019-02-26 ENCOUNTER — Inpatient Hospital Stay (HOSPITAL_COMMUNITY): Payer: Medicare Other | Admitting: Occupational Therapy

## 2019-02-26 LAB — CBC WITH DIFFERENTIAL/PLATELET
Abs Immature Granulocytes: 0.09 10*3/uL — ABNORMAL HIGH (ref 0.00–0.07)
Basophils Absolute: 0 10*3/uL (ref 0.0–0.1)
Basophils Relative: 0 %
Eosinophils Absolute: 0 10*3/uL (ref 0.0–0.5)
Eosinophils Relative: 0 %
HCT: 33.7 % — ABNORMAL LOW (ref 36.0–46.0)
Hemoglobin: 10.9 g/dL — ABNORMAL LOW (ref 12.0–15.0)
Immature Granulocytes: 1 %
Lymphocytes Relative: 3 %
Lymphs Abs: 0.3 10*3/uL — ABNORMAL LOW (ref 0.7–4.0)
MCH: 31.1 pg (ref 26.0–34.0)
MCHC: 32.3 g/dL (ref 30.0–36.0)
MCV: 96.3 fL (ref 80.0–100.0)
Monocytes Absolute: 0.5 10*3/uL (ref 0.1–1.0)
Monocytes Relative: 4 %
Neutro Abs: 10.4 10*3/uL — ABNORMAL HIGH (ref 1.7–7.7)
Neutrophils Relative %: 92 %
Platelets: 256 10*3/uL (ref 150–400)
RBC: 3.5 MIL/uL — ABNORMAL LOW (ref 3.87–5.11)
RDW: 14.2 % (ref 11.5–15.5)
WBC: 11.3 10*3/uL — ABNORMAL HIGH (ref 4.0–10.5)
nRBC: 0 % (ref 0.0–0.2)

## 2019-02-26 LAB — BASIC METABOLIC PANEL
Anion gap: 15 (ref 5–15)
BUN: 18 mg/dL (ref 8–23)
CO2: 25 mmol/L (ref 22–32)
Calcium: 10.6 mg/dL — ABNORMAL HIGH (ref 8.9–10.3)
Chloride: 99 mmol/L (ref 98–111)
Creatinine, Ser: 1.11 mg/dL — ABNORMAL HIGH (ref 0.44–1.00)
GFR calc Af Amer: >60 mL/min (ref >60)
GFR calc non Af Amer: 52 mL/min — ABNORMAL LOW (ref >60)
Glucose, Bld: 130 mg/dL — ABNORMAL HIGH (ref 70–99)
Potassium: 3.8 mmol/L (ref 3.5–5.1)
Sodium: 139 mmol/L (ref 135–145)

## 2019-02-26 MED ORDER — GADOBUTROL 1 MMOL/ML IV SOLN
7.0000 mL | Freq: Once | INTRAVENOUS | Status: AC | PRN
  Administered 2019-02-26: 7 mL via INTRAVENOUS

## 2019-02-26 MED ORDER — SODIUM CHLORIDE 0.45 % IV SOLN: INTRAVENOUS | Status: DC

## 2019-02-26 NOTE — Progress Notes (Signed)
  NEUROSURGERY PROGRESS NOTE   No issues overnight.   EXAM:  BP 139/83 (BP Location: Left Arm)   Pulse 68   Temp 98.4 F (36.9 C)   Resp 18   Ht '5\' 5"'$  (1.651 m)   Wt 70.2 kg   SpO2 98%   BMI 25.75 kg/m   Awake, attentive to conversation Oriented to person, place, not year Speech dysarthric but fluent Moves all extremities well (+) truncal ataxia (+) dysmetria bilaterally  IMAGING: MRI brain w/w/o reviewed, demonstrates drastic enlargement of vermian lesion, now with multiple new metastases both supra- and infratentorial. There has also been enlargement of multiple other metastases. The vermian met does compress the aqueduct and there has been interval enlargement of the lateral and third ventricles.  IMPRESSION:  65 y.o. 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 Her clinical condition is likely a result of diffuse cortical/subcortical disruption from the metastases in combination with some element of HCP. I am therefore not convinced that placement of a VP shunt is going to meaningfully improve her quality of life, but would simply prevent her from succumbing from obstructive HCP and conceivably allow aggressive treatment of her Cancer (rad/chemo).   Given the rapid progression of her disease and current functional condition, I do not think aggressive treatment of her underlying malignancy is reasonable, nor am I convinced that she could even tolerate such treatment. I think the most appropriate option would be for transition to a palliative approach to her care.  PLAN: - I have consulted the palliative care service to speak with the patient and her family regarding options for palliative/hospice care. - Would only proceed with CSF shunting if patient and family are going to proceed with aggressive treatment.   I reviewed the above with the patient and her  husband over the phone. I told him that my recommendation would be for a transition to palliative care as I don't think that shunting is likely to improve her condition. I have also spoken with palliative care service who will speak with the patient and family today or tomorrow. All his questions today were answered.

## 2019-02-26 NOTE — Progress Notes (Signed)
[facility] PHYSICAL MEDICINE & REHABILITATION PROGRESS NOTE   Subjective/Complaints:   Discussed CT result with pt, still lethargic, poor appetite   ROS- neg CP, SOB,  N/V/D  Objective:   CT HEAD WO CONTRAST  Result Date: 02/25/2019 CLINICAL DATA:  Metastatic disease to brain. Recent suboccipital craniotomy for tumor resection. Left-sided headache. EXAM: CT HEAD WITHOUT CONTRAST TECHNIQUE: Contiguous axial images were obtained from the base of the skull through the vertex without intravenous contrast. COMPARISON:  MRI head 1223 2020, 02/05/2019.  CT head 02/03/2019 FINDINGS: Brain: Recent right occipital craniotomy for resection of metastatic disease in the right cerebellum. Moderate edema in the right cerebellum with mass-effect on the fourth ventricle and obstructive hydrocephalus. Progressive ventricular dilatation since prior studies. Increased cerebellar edema on the left compared to prior studies. Small metastatic deposit was noted on the left, not large enough to explain the amount of edema. Mass lesion in the superior cerebellar vermis measures approximately 23 x 23 mm, with interval growth since the prior studies. This may be the cause of increased edema in the left cerebellum. This also is contributing to obstructive hydrocephalus. Interval development of small areas of hemorrhage in the left basal ganglia. Two areas of subcentimeter hemorrhage are present at the site of previously noted metastatic disease. Increased surrounding white matter edema compared with prior study. Mild midline shift to the right. Vascular: Negative for hyperdense vessel Skull: Suboccipital right craniotomy with cranioplasty and mesh. No skeletal lesion. Sinuses/Orbits: Negative Other: None IMPRESSION: Recent right occipital craniotomy for resection of tumor in the right cerebellum. Moderate edema remains in the right cerebellum. Enlarging mass in the superior cerebellar vermis with surrounding edema and  mass-effect. Progressive obstructive hydrocephalus. Mild midline shift to the right Small areas of hemorrhage in metastatic deposits in the left basal ganglia. Increase in associated edema left basal ganglia. These results were called by telephone at the time of interpretation on 02/25/2019 at 1:46 pm to provider Silvestre Mesi PA , who verbally acknowledged these results. Electronically Signed   By: Franchot Gallo M.D.   On: 02/25/2019 13:47   No results for input(s): WBC, HGB, HCT, PLT in the last 72 hours. No results for input(s): NA, K, CL, CO2, GLUCOSE, BUN, CREATININE, CALCIUM in the last 72 hours.  Intake/Output Summary (Last 24 hours) at 02/26/2019 0858 Last data filed at 02/25/2019 1833 Gross per 24 hour  Intake 200 ml  Output --  Net 200 ml     Physical Exam: Vital Signs Blood pressure 126/71, pulse 68, temperature 98.5 F (36.9 C), temperature source Oral, resp. rate 17, height '5\' 5"'$  (1.651 m), weight 70.2 kg, SpO2 96 %.   General: No acute distress Mood and affect are appropriate Heart: Regular rate and rhythm no rubs murmurs or extra sounds Lungs: Clear to auscultation, breathing unlabored, no rales or wheezes Abdomen: Positive bowel sounds, soft nontender to palpation, nondistended Extremities: No clubbing, cyanosis, or edema Skin: No evidence of breakdown, no evidence of rash Neurologic: Cranial nerves II through XII intact, motor strength is 4/5 in bilateral deltoid, bicep, tricep, grip, hip flexor, knee extensors, ankle dorsiflexor and plantar flexor Sensory exam normal sensation to light touch and proprioception in bilateral upper and lower extremities Cerebellar exam mild dysmetria left greater than right upper extremity with finger-to-nose- now with pass pointing  Musculoskeletal: Full range of motion in all 4 extremities. No joint swelling.   Assessment/Plan: 1. Functional deficits secondary to RIght cerebellar met, endometrial primary  which require 3+ hours per day  of interdisciplinary therapy in a comprehensive inpatient rehab setting.  Physiatrist is providing close team supervision and 24 hour management of active medical problems listed below.  Physiatrist and rehab team continue to assess barriers to discharge/monitor patient progress toward functional and medical goals  Care Tool:  Bathing  Bathing activity did not occur: Refused Body parts bathed by patient: Right arm, Left arm, Abdomen, Chest, Front perineal area, Buttocks, Right upper leg, Left upper leg, Face, Right lower leg, Left lower leg   Body parts bathed by helper: Right lower leg, Left lower leg     Bathing assist Assist Level: Contact Guard/Touching assist     Upper Body Dressing/Undressing Upper body dressing   What is the patient wearing?: Pull over shirt, Orthosis    Upper body assist Assist Level: Moderate Assistance - Patient 50 - 74%    Lower Body Dressing/Undressing Lower body dressing      What is the patient wearing?: Incontinence brief, Pants     Lower body assist Assist for lower body dressing: Minimal Assistance - Patient > 75%     Toileting Toileting Toileting Activity did not occur Landscape architect and hygiene only): Refused  Toileting assist Assist for toileting: Moderate Assistance - Patient 50 - 74%     Transfers Chair/bed transfer  Transfers assist     Chair/bed transfer assist level: Minimal Assistance - Patient > 75%     Locomotion Ambulation   Ambulation assist      Assist level: Minimal Assistance - Patient > 75% Assistive device: Walker-rolling Max distance: 125   Walk 10 feet activity   Assist  Walk 10 feet activity did not occur: Safety/medical concerns(decreased balance/strong posterior lean in standing)  Assist level: Minimal Assistance - Patient > 75% Assistive device: Walker-rolling   Walk 50 feet activity   Assist Walk 50 feet with 2 turns activity did not occur: Safety/medical concerns(decreased  balance/strong posterior lean in standing)  Assist level: Minimal Assistance - Patient > 75% Assistive device: Walker-rolling    Walk 150 feet activity   Assist Walk 150 feet activity did not occur: Safety/medical concerns(decreased balance/strong posterior lean in standing)         Walk 10 feet on uneven surface  activity   Assist Walk 10 feet on uneven surfaces activity did not occur: Safety/medical concerns(decreased balance/strong posterior lean in standing)         Wheelchair     Assist   Type of Wheelchair: Manual    Wheelchair assist level: Supervision/Verbal cueing Max wheelchair distance: 75    Wheelchair 50 feet with 2 turns activity    Assist    Wheelchair 50 feet with 2 turns activity did not occur: Safety/medical concerns   Assist Level: Supervision/Verbal cueing   Wheelchair 150 feet activity     Assist  Wheelchair 150 feet activity did not occur: Safety/medical concerns(decreased activity tolerance)       Blood pressure 126/71, pulse 68, temperature 98.5 F (36.9 C), temperature source Oral, resp. rate 17, height '5\' 5"'$  (1.651 m), weight 70.2 kg, SpO2 96 %.  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 Patient with slow Decadron taperdecreased to 2 mg twice daily x48 hours and stop -Pt with decline in balance and coordination as well as nausea,Repeat CT with hydrocephalus, increase edema, ? New  Vs enlarging mass,  MRI ordered by NS Discussed with husband  decadron '4mg'$  QID        CIR PT, OT on hold for now   2. Antithrombotics: -DVT/anticoagulation:Subcutaneous heparin. Check vascular study -antiplatelet therapy: N/A 3. Pain Management:Hydrocodone as needed -back brace              -consider pre-treating for therapies 4.Zoloft 100 mg daily,:Wellbutrin 200 mg daily, BuSpar'15mg'$  twice daily -antipsychotic agents: N/A 5. Neuropsych: This patientis notcapable of making decisions on herown behalf. 6. Skin/Wound Care:Routine skin checks  incison looks good post staple removal  7. Fluids/Electrolytes/Nutrition:Routine in and outs with follow-up chemistries Start IVF at noc  8. Hypertension. Avapro 300 mg daily. Monitor with increased mobility Vitals:   02/25/19 2008 02/26/19 0600  BP: (!) 150/71 126/71  Pulse: 74 68  Resp: 18 17  Temp: 98.4 F (36.9 C) 98.5 F (36.9 C)  SpO2: 96% 96%  Blood pressure well controlled 1/14 9. Hypothyroidism. Synthroid 10. Hyperlipidemia. Crestor 11. Restless leg syndrome. Mirapex 0.5 mg nightly 12. History of renal cancer with left radical laparoscopic nephrectomy 05/21/2014. As well as history of endometrial adenocarcinoma 2017. Follow-up outpatient- Dr Earlie Server 13. Constipation. Laxative assistance 14.  HypoK mild KCL 105mq x 1 , recheck BMET   1/11: K+ stable.   LOS: 10 days A FACE TO FACE EVALUATION WAS PERFORMED  ACharlett Blake1/14/2021, 8:58 AM

## 2019-02-26 NOTE — Progress Notes (Signed)
Physical Therapy Session Note  Patient Details  Name: Krista Hodges MRN: [id number] Date of Birth: 28-Oct-1955  Today's Date: 02/26/2019 PT Individual Time: 2712-9290 PT Individual Time Calculation (min): 14 min   Short Term Goals:  Week 2:  PT Short Term Goal 1 (Week 2): Pt will perform bed mobility with min assist PT Short Term Goal 2 (Week 2): Pt will transfer to [facility] with min assist PT Short Term Goal 3 (Week 2): Pt will ambulate 59f with mod assist and LRAD  Skilled Therapeutic Interventions/Progress Updates:   Spoke with MD who confirmed pt cleared for therapies with decreased intensity 15/7, no need for bed rest at this time. Pt received supine in bed and agreeable to attempt PT. Supine>sit transfer with max assist for trunk control. Heavy R/posterior lean. Sitting balance EOB with mod assist to prevent R LOB. Transport then present to take pt to MRI. Mod assist to return to supine. [facility] left supine in bed with transport present in room and all pt needs met.     Session 2.  Pt received supine in bed aroused to voice. Pt reports feeling extremely fatigued and does not wish to get out of bed. Pt also declined bed level therapy.  Pt left supine in bed with call bell in reach and all needs met.       Therapy Documentation Precautions:  Precautions Precautions: Back Required Braces or Orthoses: Spinal Brace Spinal Brace: Thoracolumbosacral orthotic Restrictions Weight Bearing Restrictions: No General: PT Amount of Missed Time (min): 46 Minutes and 60 min  PT Missed Treatment Reason: CT/MRI and fatigue.     Therapy/Group: Individual Therapy  ALorie Phenix1/14/2021, 12:07 PM

## 2019-02-26 NOTE — Progress Notes (Signed)
Occupational Therapy Note  Patient Details  Name: Krista Hodges MRN: [id number] Date of Birth: 1955-11-14  Today's Date: 02/26/2019 OT Missed Time: 74 Minutes Missed Time Reason: Patient on bedrest  Pt currently on bedrest and awaiting MRI this morning due to significant functional decline. OT will follow as appropriate.    Darleen Crocker P MS, OTR/L 02/26/2019, 7:24 AM

## 2019-02-27 ENCOUNTER — Inpatient Hospital Stay (HOSPITAL_COMMUNITY): Payer: Medicare Other | Admitting: Physical Therapy

## 2019-02-27 ENCOUNTER — Inpatient Hospital Stay (HOSPITAL_COMMUNITY): Payer: Medicare Other | Admitting: Occupational Therapy

## 2019-02-27 ENCOUNTER — Ambulatory Visit: Payer: PRIVATE HEALTH INSURANCE

## 2019-02-27 DIAGNOSIS — Z515 Encounter for palliative care: Secondary | ICD-10-CM

## 2019-02-27 DIAGNOSIS — G911 Obstructive hydrocephalus: Secondary | ICD-10-CM

## 2019-02-27 DIAGNOSIS — Z66 Do not resuscitate: Secondary | ICD-10-CM

## 2019-02-27 MED ORDER — MORPHINE SULFATE (CONCENTRATE) 10 MG/0.5ML PO SOLN: 5.0000 mg | ORAL | 0 refills | Status: AC | PRN

## 2019-02-27 MED ORDER — PRAMIPEXOLE DIHYDROCHLORIDE 0.5 MG PO TABS: 0.5000 mg | ORAL_TABLET | Freq: Every day | ORAL | 0 refills | Status: AC

## 2019-02-27 MED ORDER — ROSUVASTATIN CALCIUM 10 MG PO TABS: 10.0000 mg | ORAL_TABLET | Freq: Every day | ORAL | 0 refills | Status: AC

## 2019-02-27 MED ORDER — IRBESARTAN 300 MG PO TABS: 300.0000 mg | ORAL_TABLET | Freq: Every day | ORAL | 0 refills | Status: AC

## 2019-02-27 MED ORDER — BUPROPION HCL 100 MG PO TABS: 200.0000 mg | ORAL_TABLET | Freq: Every day | ORAL | 0 refills | Status: AC

## 2019-02-27 MED ORDER — HYDROCODONE-ACETAMINOPHEN 5-325 MG PO TABS: 1.0000 | ORAL_TABLET | ORAL | 0 refills | Status: DC | PRN

## 2019-02-27 MED ORDER — LORAZEPAM 0.5 MG PO TABS
0.5000 mg | ORAL_TABLET | Freq: Four times a day (QID) | ORAL | Status: DC | PRN
  Administered 2019-02-27: 1 mg via ORAL
  Filled 2019-02-27: qty 2

## 2019-02-27 MED ORDER — ACETAMINOPHEN 325 MG PO TABS: 650.0000 mg | ORAL_TABLET | ORAL | Status: AC | PRN

## 2019-02-27 MED ORDER — LEVOTHYROXINE SODIUM 75 MCG PO TABS: 75.0000 ug | ORAL_TABLET | Freq: Every day | ORAL | 0 refills | Status: AC

## 2019-02-27 MED ORDER — BUSPIRONE HCL 15 MG PO TABS: 15.0000 mg | ORAL_TABLET | Freq: Two times a day (BID) | ORAL | 0 refills | Status: AC

## 2019-02-27 MED ORDER — PANTOPRAZOLE SODIUM 40 MG PO TBEC: 40.0000 mg | DELAYED_RELEASE_TABLET | Freq: Every day | ORAL | 0 refills | Status: AC

## 2019-02-27 MED ORDER — DEXAMETHASONE 4 MG PO TABS: 4.0000 mg | ORAL_TABLET | Freq: Four times a day (QID) | ORAL | 0 refills | Status: AC

## 2019-02-27 MED ORDER — DOCUSATE SODIUM 100 MG PO CAPS: 200.0000 mg | ORAL_CAPSULE | Freq: Two times a day (BID) | ORAL | 0 refills | Status: AC

## 2019-02-27 MED ORDER — MORPHINE SULFATE (CONCENTRATE) 10 MG/0.5ML PO SOLN
5.0000 mg | ORAL | Status: DC | PRN
  Administered 2019-02-27 (×2): 10 mg via ORAL
  Filled 2019-02-27 (×2): qty 0.5

## 2019-02-27 MED ORDER — SERTRALINE HCL 50 MG PO TABS: 100.0000 mg | ORAL_TABLET | Freq: Every day | ORAL | 0 refills | Status: AC

## 2019-02-27 MED ORDER — LORAZEPAM 0.5 MG PO TABS: 0.5000 mg | ORAL_TABLET | Freq: Four times a day (QID) | ORAL | 0 refills | Status: AC | PRN

## 2019-02-27 NOTE — Discharge Summary (Signed)
Physician Discharge Summary  Patient ID: Krista Hodges MRN: [id number] DOB/AGE: 04/16/1955 64 y.o.  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[facility]) Active Problems:   History of memory loss DVT prophylaxis Mood stabilization Hypertension Hypothyroidism Hyperlipidemia Restless leg syndrome History of renal cancer with left radical laparoscopic nephrectomy 05/21/2014 as well as history of endometrial adenocarcinoma 2017 followed outpatient by oncology services  Discharged Condition: Guarded  Significant Diagnostic Studies: DG Chest 1 View  Result Date: 02/03/2019 CLINICAL DATA:  Generalized weakness after falling EXAM: CHEST  1 VIEW COMPARISON:  05/02/2017 FINDINGS: Cardiomediastinal contours are normal accounting for portable technique. Lungs are clear. Postoperative changes are noted in the low neck. Visualized skeletal structures are unremarkable. IMPRESSION: No acute cardiopulmonary disease. Electronically Signed   By: Zetta Bills M.D.   On: 02/03/2019 19:40   DG Forearm Right  Result Date: 02/03/2019 CLINICAL DATA:  Right forearm pain, left hand pain, left shoulder pain and generalized weakness. EXAM: RIGHT FOREARM - 2 VIEW COMPARISON:  None FINDINGS: There is no evidence of fracture or other focal bone lesions. Soft tissues are unremarkable. IMPRESSION: Negative evaluation of the right forearm Electronically Signed   By: Zetta Bills M.D.   On: 02/03/2019 19:39   CT HEAD WO CONTRAST  Result Date: 02/25/2019 CLINICAL DATA:  Metastatic disease to brain. Recent suboccipital craniotomy for tumor resection. Left-sided headache. EXAM: CT HEAD WITHOUT CONTRAST TECHNIQUE: Contiguous axial images were obtained from the base of the skull through the vertex without intravenous contrast. COMPARISON:  MRI head 1223 2020, 02/05/2019.  CT head 02/03/2019 FINDINGS: Brain: Recent right occipital craniotomy for resection of  metastatic disease in the right cerebellum. Moderate edema in the right cerebellum with mass-effect on the fourth ventricle and obstructive hydrocephalus. Progressive ventricular dilatation since prior studies. Increased cerebellar edema on the left compared to prior studies. Small metastatic deposit was noted on the left, not large enough to explain the amount of edema. Mass lesion in the superior cerebellar vermis measures approximately 23 x 23 mm, with interval growth since the prior studies. This may be the cause of increased edema in the left cerebellum. This also is contributing to obstructive hydrocephalus. Interval development of small areas of hemorrhage in the left basal ganglia. Two areas of subcentimeter hemorrhage are present at the site of previously noted metastatic disease. Increased surrounding white matter edema compared with prior study. Mild midline shift to the right. Vascular: Negative for hyperdense vessel Skull: Suboccipital right craniotomy with cranioplasty and mesh. No skeletal lesion. Sinuses/Orbits: Negative Other: None IMPRESSION: Recent right occipital craniotomy for resection of tumor in the right cerebellum. Moderate edema remains in the right cerebellum. Enlarging mass in the superior cerebellar vermis with surrounding edema and mass-effect. Progressive obstructive hydrocephalus. Mild midline shift to the right Small areas of hemorrhage in metastatic deposits in the left basal ganglia. Increase in associated edema left basal ganglia. These results were called by telephone at the time of interpretation on 02/25/2019 at 1:46 pm to provider Silvestre Mesi PA , who verbally acknowledged these results. Electronically Signed   By: Franchot Gallo M.D.   On: 02/25/2019 13:47   CT Head Wo Contrast  Result Date: 02/03/2019 CLINICAL DATA:  Weakness and falls over last 3 days, history of renal cell carcinoma EXAM: CT HEAD WITHOUT CONTRAST TECHNIQUE: Contiguous axial images were obtained from  the base of the skull through the vertex without intravenous contrast. COMPARISON:  None. FINDINGS: Brain: There is  a round hemorrhagic mass seen within the posterior right cerebellum measuring 2.8 cm. The mass a significant surrounding edema causes mass effect upon the posterior fourth ventricle and left cerebellum. There is also edema extending into the left cerebellum. There is dilatation the ventricles and sulci consistent with age-related atrophy. Low-attenuation changes in the deep white matter consistent with small vessel ischemia. Vascular: No hyperdense vessel or unexpected calcification. Skull: The skull is intact. No fracture or focal lesion identified. Sinuses/Orbits: The visualized paranasal sinuses and mastoid air cells are clear. The orbits and globes intact. Other: None IMPRESSION: Hemorrhagic 2.8 cm mass within the right cerebellum with surrounding vasogenic edema causing mass effect upon the fourth ventricle and left cerebellum. There is dilatation the ventricles and sulci consistent with age-related atrophy. Low-attenuation changes in the deep white matter consistent with small vessel ischemia. These results were called by telephone at the time of interpretation on 02/03/2019 at 8:46 pm to provider [facility] , who verbally acknowledged these results. Electronically Signed   By: Prudencio Pair M.D.   On: 02/03/2019 20:54   CT Chest W Contrast  Result Date: 02/03/2019 CLINICAL DATA:  Increased weakness and falls for the last 3 days EXAM: CT CHEST, ABDOMEN, AND PELVIS WITH CONTRAST TECHNIQUE: Multidetector CT imaging of the chest, abdomen and pelvis was performed following the standard protocol during bolus administration of intravenous contrast. CONTRAST:  115mL OMNIPAQUE IOHEXOL 350 MG/ML SOLN COMPARISON:  CT chest, abdomen and pelvis 12/02/2015 FINDINGS: CT CHEST FINDINGS Cardiovascular: The aorta is normal caliber. No dissection flap or other acute luminal abnormality of the aorta is  seen. No periaortic stranding or hemorrhage. Atherosclerotic plaque within the normal caliber aorta. Normal 3 vessel branching of the arch. Central pulmonary arteries are normal caliber. No large central filling defects on this non tailored examination. Normal heart size. No pericardial effusion. Mediastinum/Nodes: No mediastinal fluid or hemorrhage. Physiologic amount of fluid present within the pericardial recesses. There is mediastinal and right hilar adenopathy. Findings include a centrally hypoattenuating 1.9 cm subcarinal nodule (4/27) and a 1.4 cm right hilar node with central hypoattenuation (4/27). There is a borderline enlarged right axillary node measuring 10 mm (4/32). Few nodular soft tissue densities in the upper-outer quadrant of the right breast with a more focal cutaneous based lesion in the lower inner quadrant measuring up to 2 cm in size. Lungs/Pleura: There is a multilobulated mass lesion abutting the pleura with acute angular margins in the medial aspect of the right middle lobe. This measures up to 3 cm in size (6/87). Smaller adjacent 9 mm satellite nodule is noted (6/76 few additional satellite nodules are noted adjacent the right hilar vessels and bronchi. Subpleural 5 mm nodule noted in the lateral left upper lobe (6/64). No acute traumatic abnormality of the lung parenchyma. No consolidation, features of edema, pneumothorax, or effusion. Musculoskeletal: Dedicated thoracic spine reconstructions were generated, please see that report for further details of the spine. No visible displaced rib fractures. No suspicious osseous lesions in the chest CT ABDOMEN PELVIS FINDINGS Hepatobiliary: No direct hepatic injury or perihepatic hematoma. Subcentimeter hypoattenuating focus in the posterior right lobe liver (3/2) is too small to fully characterize on CT imaging. No focal liver abnormality is seen. No gallstones, gallbladder wall thickening, or biliary dilatation. Pancreas: Unremarkable. No  pancreatic ductal dilatation or surrounding inflammatory changes. Spleen: Normal in size without focal abnormality. Adrenals/Urinary Tract: Irregular centrally hypoattenuating 3.3 cm lesion in the left adrenal gland. Additional hypertense 1.6 cm nodule in the lateral limb  right adrenal gland. No convincing adrenal hemorrhage is seen however patient appears to be post left nephrectomy with some surgical material in the left nephrectomy bed. No right renal injury or perirenal hemorrhage. No suspicious renal lesion. Normal enhancement and excretion without extravasation of contrast from the collecting system on urinary phase delays. Mild bladder wall thickening. Faint perivesicular haze. No direct bladder injury. Stomach/Bowel: Distal esophagus, stomach and duodenal sweep are unremarkable. No small bowel wall thickening or dilatation. No evidence of obstruction. A normal appendix is visualized. No colonic dilatation or wall thickening. No sites of mesenteric hematoma or contusion. Vascular/Lymphatic: No direct vascular injury in the abdomen or pelvis. Few prominent retroperitoneal nodes are noted including a 8 mm periaortic lymph node (4/72). Reproductive: Patient is post hysterectomy. No concerning adnexal lesions. Postsurgical changes noted in the low anterior abdomen Other: Mild body wall edema. No large body wall hematoma. No bowel containing hernia. No traumatic abdominal wall injury or dehiscence. Musculoskeletal: Dedicated lumbar spine reconstructions were generated, please see that report for further details. No abnormalities of the bony pelvis. No intramuscular hematoma or other acute muscular abnormality is seen. Slight age related atrophy is noted. IMPRESSION: 1. Compression deformities at T12 and L3, better detailed dedicated thoracolumbar spine reconstructions. Please see that report for further details. 2. No other evidence of acute traumatic injury to the chest, abdomen or pelvis. 3. Lobulated mass  lesion in the medial aspect of the right middle lobe with adjacent satellite nodules and mediastinal and right hilar adenopathy. 4. Soft tissue nodule in the lower inner quadrant of the right breast with additional nodularity in the upper-outer quadrant of the same breast. Should correlate with mammography and dedicated breast imaging. 5. Bilateral adrenal masses concerning for metastatic disease. 6. Subcentimeter hypoattenuating focus in the right lobe liver as well as some prominent though nonenlarged lymph nodes, while these are typically characterized as benign incidental findings, should be viewed with some suspicion given the findings above. 7. Mild bladder wall thickening with subtle perivesicular haze, which may represent cystitis. 8. Prior left nephrectomy. 9.  Aortic Atherosclerosis (ICD10-I70.0). These results were called by telephone at the time of interpretation on 02/03/2019 at 9:12 pm to provider [facility] , who verbally acknowledged these results. Electronically Signed   By: Lovena Le M.D.   On: 02/03/2019 21:14   CT Cervical Spine Wo Contrast  Result Date: 02/03/2019 CLINICAL DATA:  Fall. EXAM: CT CERVICAL SPINE WITHOUT CONTRAST TECHNIQUE: Multidetector CT imaging of the cervical spine was performed without intravenous contrast. Multiplanar CT image reconstructions were also generated. COMPARISON:  MRI cervical spine 12/27/2011 FINDINGS: Alignment: Normal Skull base and vertebrae: Negative for fracture Soft tissues and spinal canal: Negative for soft tissue mass or swelling. Prior thyroidectomy. Disc levels: Disc degeneration and spurring multiple levels. Bilateral foraminal stenosis at C3-4, C4-5, C5-6, C6-7 due to spurring. Upper chest: Lung apices clear bilaterally. Other: There is mass-effect on the posterior fossa with effacement of the fourth ventricle and obstructive hydrocephalus. There is question of high-density subdural blood in the right posterior fossa. This area is  not well evaluated due to streak artifact. Recommend stat head CT. IMPRESSION: Abnormal mass-effect in the posterior fossa with obstructive hydrocephalus. Question subdural hematoma or mass in the posterior fossa on the right. Recommend stat head CT. Cervical spondylosis without fracture These results were called by telephone at the time of interpretation on 02/03/2019 at 8:49 pm to provider [facility] , who verbally acknowledged these results. Electronically Signed   By: Juanda Crumble  Carlis Abbott M.D.   On: 02/03/2019 20:50   MR BRAIN W WO CONTRAST  Result Date: 02/26/2019 CLINICAL DATA:  Follow-up right cerebellar tumor resection. EXAM: MRI HEAD WITHOUT AND WITH CONTRAST TECHNIQUE: Multiplanar, multiecho pulse sequences of the brain and surrounding structures were obtained without and with intravenous contrast. CONTRAST:  29mL GADAVIST GADOBUTROL 1 MMOL/ML IV SOLN COMPARISON:  Head CT yesterday.  MRI 02/05/2019 and 02/04/2019. FINDINGS: Brain: Continued rapid enlargement of a superior cerebellar metastasis in the midline, now measuring 3.4 x 3.1 x 2.3 cm in diameter. Mass-effect results in relative obstruction of the aqueduct, with obstructive hydrocephalus of the lateral and third ventricles. Post resection changes of the inferior cerebellum on the right. No sign of residual or recurrent tumor in that location. 7 mm enlarging mass on the right near the foramen of Luschka. Two enlarging metastatic foci in the lateral cerebellum on the left measuring 3-4 mm in size, axial image 11. Other scattered small metastases in the cerebellum about that same size. Overall, there are probably 810 small cerebellar metastases. Within the cerebral hemispheres, there is enlargement of a left parietal deep white matter metastasis now measuring 7 mm in diameter, axial image 36. Enlarged metastasis in the left posterior basal ganglia/radiating white matter tracts no measuring 7.5 mm axial image 30. Enlarging metastasis in the left  basal ganglia/genu of the internal capsule now measuring 9 mm in diameter axial image 28. Enlargement task assists in the left occipital lobe now measuring 5 mm axial image 27. On the right, there are several 4 mm and smaller metastases which are more evident. Vascular: Major vessels at the base of the brain show flow. Skull and upper cervical spine: Negative Sinuses/Orbits: Clear/negative Other: None IMPRESSION: Marked enlargement of a superior cerebellar metastasis measuring 3.4 x 3.1 x 2.3 cm today compared with about 1.5 cm in diameter on 02/04/2019. Marked increase in mass effect, with relative obstruction of the distal aqueduct/upper fourth ventricle. Obstructive hydrocephalus of the lateral and third ventricles. Numerous other metastatic lesions throughout the cerebellum and cerebral hemispheres which are larger or newly seen. Larger but subcentimeter metastases in the left basal ganglia, left occipital lobe and in the region of the foramen of Luschka on the right. Numerous other punctate metastases 4 mm and less within the cerebellum and cerebral hemispheres. The overall rate of growth of lesions in this case is extraordinarily fast. Electronically Signed   By: Nelson Chimes M.D.   On: 02/26/2019 12:37   MR BRAIN W WO CONTRAST  Result Date: 02/05/2019 CLINICAL DATA:  Post craniotomy EXAM: MRI HEAD WITHOUT AND WITH CONTRAST TECHNIQUE: Multiplanar, multiecho pulse sequences of the brain and surrounding structures were obtained without and with intravenous contrast. CONTRAST:  61mL GADAVIST GADOBUTROL 1 MMOL/ML IV SOLN COMPARISON:  Post craniotomy FINDINGS: Brain: There are new postoperative changes of right suboccipital craniotomy for resection of dominant right cerebellar mass. Resection cavity contains air, fluid, and blood products with minimal, likely postsurgical enhancement at the margins. Mild reduced diffusion at the cavity margins likely reflects postoperative contusion. Surrounding edema remains  similar. There is persistent mass-effect on the fourth ventricle with resulting similar hydrocephalus. Additional enhancing lesions and associated edema are stable over the short interval. Foci of T2 hyperintensity in the supratentorial white matter likely reflect a combination chronic microvascular ischemic changes and is digital edema related to hydrocephalus. There are scattered extra-axial foci of susceptibility likely reflecting pneumocephalus. Vascular: Major vessel flow voids at the skull base are preserved. Skull and upper cervical spine:  There is abnormal T1 marrow signal eccentric to the right at C3 and possibly C4. Sinuses/Orbits: Paranasal sinuses are aerated. Orbits are unremarkable. Other: Sella is unremarkable.  Mastoid air cells are clear. 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 Associated mass effect and hydrocephalus are similar. Stable additional metastatic lesions. Abnormal marrow signal the upper cervical spine suspicious for metastatic disease. Electronically Signed   By: Macy Mis M.D.   On: 02/05/2019 08:35   MR BRAIN W WO CONTRAST  Result Date: 02/04/2019 CLINICAL DATA:  Occurrence falls and weakness. History of multiple carcinomas. EXAM: MRI HEAD WITHOUT AND WITH CONTRAST TECHNIQUE: Multiplanar, multiecho pulse sequences of the brain and surrounding structures were obtained without and with intravenous contrast. CONTRAST:  52mL GADAVIST GADOBUTROL 1 MMOL/ML IV SOLN COMPARISON:  Head CT 02/03/2019 FINDINGS: Brain: There is a lesion of the right cerebellar hemisphere with nodular peripheral contrast enhancement, measuring 2.8 x 2.6 cm. There is associated hemorrhage is demonstrated on the earlier head CT. This causes moderate edema in the right cerebellar hemisphere with mass effect on the fourth ventricle. There is also a lesion of the superior cerebellar vermis that measures 1.5 x 1.4 cm. There are subcentimeter  lesions of the left deep gray nuclei, left occipital lobe, left parietal white matter and left cerebellum. Early confluent hyperintense T2-weighted signal of the periventricular and deep white matter, most commonly due to chronic ischemic microangiopathy. Advanced atrophy for age. Blood-sensitive sequences show a single focus of chronic microhemorrhage in the right frontal white matter. The midline structures are normal. Vascular: Normal flow voids. Skull and upper cervical spine: Normal marrow signal. Sinuses/Orbits: Negative. Other: None. IMPRESSION: 1. Seven intracranial metastatic lesions, the largest of which is in the right cerebellar hemisphere and measures up to 2.6 cm with associated hemorrhage and moderate mass effect on the fourth ventricle. 2. Advanced atrophy and chronic ischemic microangiopathy. Lesions are annotated on series 11, axial postcontrast T1-weighted imaging. Electronically Signed   By: Ulyses Jarred M.D.   On: 02/04/2019 01:28   CT ABDOMEN PELVIS W CONTRAST  Result Date: 02/03/2019 CLINICAL DATA:  Increased weakness and falls for the last 3 days EXAM: CT CHEST, ABDOMEN, AND PELVIS WITH CONTRAST TECHNIQUE: Multidetector CT imaging of the chest, abdomen and pelvis was performed following the standard protocol during bolus administration of intravenous contrast. CONTRAST:  19mL OMNIPAQUE IOHEXOL 350 MG/ML SOLN COMPARISON:  CT chest, abdomen and pelvis 12/02/2015 FINDINGS: CT CHEST FINDINGS Cardiovascular: The aorta is normal caliber. No dissection flap or other acute luminal abnormality of the aorta is seen. No periaortic stranding or hemorrhage. Atherosclerotic plaque within the normal caliber aorta. Normal 3 vessel branching of the arch. Central pulmonary arteries are normal caliber. No large central filling defects on this non tailored examination. Normal heart size. No pericardial effusion. Mediastinum/Nodes: No mediastinal fluid or hemorrhage. Physiologic amount of fluid present  within the pericardial recesses. There is mediastinal and right hilar adenopathy. Findings include a centrally hypoattenuating 1.9 cm subcarinal nodule (4/27) and a 1.4 cm right hilar node with central hypoattenuation (4/27). There is a borderline enlarged right axillary node measuring 10 mm (4/32). Few nodular soft tissue densities in the upper-outer quadrant of the right breast with a more focal cutaneous based lesion in the lower inner quadrant measuring up to 2 cm in size. Lungs/Pleura: There is a multilobulated mass lesion abutting the pleura with acute angular margins in the medial aspect of the right middle lobe. This measures up to 3 cm  in size (6/87). Smaller adjacent 9 mm satellite nodule is noted (6/76 few additional satellite nodules are noted adjacent the right hilar vessels and bronchi. Subpleural 5 mm nodule noted in the lateral left upper lobe (6/64). No acute traumatic abnormality of the lung parenchyma. No consolidation, features of edema, pneumothorax, or effusion. Musculoskeletal: Dedicated thoracic spine reconstructions were generated, please see that report for further details of the spine. No visible displaced rib fractures. No suspicious osseous lesions in the chest CT ABDOMEN PELVIS FINDINGS Hepatobiliary: No direct hepatic injury or perihepatic hematoma. Subcentimeter hypoattenuating focus in the posterior right lobe liver (3/2) is too small to fully characterize on CT imaging. No focal liver abnormality is seen. No gallstones, gallbladder wall thickening, or biliary dilatation. Pancreas: Unremarkable. No pancreatic ductal dilatation or surrounding inflammatory changes. Spleen: Normal in size without focal abnormality. Adrenals/Urinary Tract: Irregular centrally hypoattenuating 3.3 cm lesion in the left adrenal gland. Additional hypertense 1.6 cm nodule in the lateral limb right adrenal gland. No convincing adrenal hemorrhage is seen however patient appears to be post left nephrectomy with  some surgical material in the left nephrectomy bed. No right renal injury or perirenal hemorrhage. No suspicious renal lesion. Normal enhancement and excretion without extravasation of contrast from the collecting system on urinary phase delays. Mild bladder wall thickening. Faint perivesicular haze. No direct bladder injury. Stomach/Bowel: Distal esophagus, stomach and duodenal sweep are unremarkable. No small bowel wall thickening or dilatation. No evidence of obstruction. A normal appendix is visualized. No colonic dilatation or wall thickening. No sites of mesenteric hematoma or contusion. Vascular/Lymphatic: No direct vascular injury in the abdomen or pelvis. Few prominent retroperitoneal nodes are noted including a 8 mm periaortic lymph node (4/72). Reproductive: Patient is post hysterectomy. No concerning adnexal lesions. Postsurgical changes noted in the low anterior abdomen Other: Mild body wall edema. No large body wall hematoma. No bowel containing hernia. No traumatic abdominal wall injury or dehiscence. Musculoskeletal: Dedicated lumbar spine reconstructions were generated, please see that report for further details. No abnormalities of the bony pelvis. No intramuscular hematoma or other acute muscular abnormality is seen. Slight age related atrophy is noted. IMPRESSION: 1. Compression deformities at T12 and L3, better detailed dedicated thoracolumbar spine reconstructions. Please see that report for further details. 2. No other evidence of acute traumatic injury to the chest, abdomen or pelvis. 3. Lobulated mass lesion in the medial aspect of the right middle lobe with adjacent satellite nodules and mediastinal and right hilar adenopathy. 4. Soft tissue nodule in the lower inner quadrant of the right breast with additional nodularity in the upper-outer quadrant of the same breast. Should correlate with mammography and dedicated breast imaging. 5. Bilateral adrenal masses concerning for metastatic  disease. 6. Subcentimeter hypoattenuating focus in the right lobe liver as well as some prominent though nonenlarged lymph nodes, while these are typically characterized as benign incidental findings, should be viewed with some suspicion given the findings above. 7. Mild bladder wall thickening with subtle perivesicular haze, which may represent cystitis. 8. Prior left nephrectomy. 9.  Aortic Atherosclerosis (ICD10-I70.0). These results were called by telephone at the time of interpretation on 02/03/2019 at 9:12 pm to provider [facility] , who verbally acknowledged these results. Electronically Signed   By: Lovena Le M.D.   On: 02/03/2019 21:14   CT T-SPINE NO CHARGE  Result Date: 02/03/2019 CLINICAL DATA:  Increasing weakness and multiple falls for 3 days EXAM: CT THORACIC AND LUMBAR SPINE WITHOUT CONTRAST TECHNIQUE: Multiplanar CT images of the  thoracic and lumbar spine were reconstructed from contemporary CT of the Chest, abdomen and pelvis. CONTRAST:  None or No additional COMPARISON:  Chest radiograph 02/03/2019 CT abdomen pelvis 05/04/2015, chest radiograph 05/02/2017, lumbar radiographs 10/13/2018 FINDINGS: THORACIC SPINE: Alignment: There is straightening of the normal thoracic kyphosis throughout the lower thoracic levels with focal kyphotic curvature at the level of the T12 compression deformity. Mild dextrocurvature throughout the thoracic spine with an apex at the T11 level. No traumatic listhesis. No abnormal widened, jumped or perched facets. Vertebrae: There is multilevel Schmorl's node formations. Anterior wedging compression deformity of T12 (AO spine A1) with approximately 50% height loss anteriorly some persistent lucency suggesting acuity. No other acute osseous injury or compression deformity is seen in the thoracic spine. No suspicious osseous lesions. Paraspinal and other soft tissues: Small amount paravertebral soft tissue thickening/stranding adjacent the T12 compression  deformity. Finding favors acuity. For findings within the posterior chest and mediastinum, see dedicated CT from which this study is reconstructed. Disc levels: There is diffuse multilevel intervertebral disc height loss with discogenic endplate changes and some posterior disc osteophyte formation at T4-T5, T5-T6 and T7-T8 but without significant resulting canal stenosis or neural foraminal narrowing in the imaged thoracic spine. LUMBAR SPINE: Segmentation: 5 non-rib-bearing lumbar type vertebral levels. Alignment: There is slight levocurvature of the lumbar spine with an apex at L3. No abnormally widened, jumped or perched facets. Vertebrae: There is a split/pincer type compression deformity of L3 (AO spine A2). Central height loss of approximately 50%. No abnormal posterior wall involvement or retropulsion of fracture fragments. No additional acute fracture is seen. Posterior elements are intact. No suspicious osseous lesions. Multilevel Schmorl's node formations seen in the lumbar spine. Paraspinal and other soft tissues: Small amount of paravertebral soft tissue thickening at the L3 level suggests acuity. No visible canal hematoma. For findings in the posterior abdomen and pelvis, see dedicated CT from which this study is reconstructed. Disc levels: Multilevel intervertebral disc height loss throughout the lumbar spine with multilevel global disc bulges. Central disc protrusions at L2 and L3 result in mild canal stenosis. A left central disc protrusion at L1 results in more moderate canal narrowing. Facet hypertrophic changes are maximal at L5-S1 with resulting mild to moderate multilevel neural foraminal narrowing the lumbar spine. IMPRESSION: 1. Anterior wedging compression deformity T12 with 50% height loss anteriorly (AOSpineA1). 2. Split/Pincer type compression deformity of L3 with approximately 50% height loss anteriorly (AOSpine A2). 3. Faint paraspinal soft tissue thickening adjacent both these  deformities suggest acuity. 4. No other acute osseous injuries in the thoracic or lumbar spine. 5. Multilevel degenerative changes of the thoracic and lumbar spine, as described above. 6. For findings in the posterior chest, abdomen and pelvis, see dedicated CT from which this study is reconstructed. Electronically Signed   By: Lovena Le M.D.   On: 02/03/2019 20:56   CT L-SPINE NO CHARGE  Result Date: 02/03/2019 CLINICAL DATA:  Increasing weakness and multiple falls for 3 days EXAM: CT THORACIC AND LUMBAR SPINE WITHOUT CONTRAST TECHNIQUE: Multiplanar CT images of the thoracic and lumbar spine were reconstructed from contemporary CT of the Chest, abdomen and pelvis. CONTRAST:  None or No additional COMPARISON:  Chest radiograph 02/03/2019 CT abdomen pelvis 05/04/2015, chest radiograph 05/02/2017, lumbar radiographs 10/13/2018 FINDINGS: THORACIC SPINE: Alignment: There is straightening of the normal thoracic kyphosis throughout the lower thoracic levels with focal kyphotic curvature at the level of the T12 compression deformity. Mild dextrocurvature throughout the thoracic spine with an apex at  the T11 level. No traumatic listhesis. No abnormal widened, jumped or perched facets. Vertebrae: There is multilevel Schmorl's node formations. Anterior wedging compression deformity of T12 (AO spine A1) with approximately 50% height loss anteriorly some persistent lucency suggesting acuity. No other acute osseous injury or compression deformity is seen in the thoracic spine. No suspicious osseous lesions. Paraspinal and other soft tissues: Small amount paravertebral soft tissue thickening/stranding adjacent the T12 compression deformity. Finding favors acuity. For findings within the posterior chest and mediastinum, see dedicated CT from which this study is reconstructed. Disc levels: There is diffuse multilevel intervertebral disc height loss with discogenic endplate changes and some posterior disc osteophyte  formation at T4-T5, T5-T6 and T7-T8 but without significant resulting canal stenosis or neural foraminal narrowing in the imaged thoracic spine. LUMBAR SPINE: Segmentation: 5 non-rib-bearing lumbar type vertebral levels. Alignment: There is slight levocurvature of the lumbar spine with an apex at L3. No abnormally widened, jumped or perched facets. Vertebrae: There is a split/pincer type compression deformity of L3 (AO spine A2). Central height loss of approximately 50%. No abnormal posterior wall involvement or retropulsion of fracture fragments. No additional acute fracture is seen. Posterior elements are intact. No suspicious osseous lesions. Multilevel Schmorl's node formations seen in the lumbar spine. Paraspinal and other soft tissues: Small amount of paravertebral soft tissue thickening at the L3 level suggests acuity. No visible canal hematoma. For findings in the posterior abdomen and pelvis, see dedicated CT from which this study is reconstructed. Disc levels: Multilevel intervertebral disc height loss throughout the lumbar spine with multilevel global disc bulges. Central disc protrusions at L2 and L3 result in mild canal stenosis. A left central disc protrusion at L1 results in more moderate canal narrowing. Facet hypertrophic changes are maximal at L5-S1 with resulting mild to moderate multilevel neural foraminal narrowing the lumbar spine. IMPRESSION: 1. Anterior wedging compression deformity T12 with 50% height loss anteriorly (AOSpineA1). 2. Split/Pincer type compression deformity of L3 with approximately 50% height loss anteriorly (AOSpine A2). 3. Faint paraspinal soft tissue thickening adjacent both these deformities suggest acuity. 4. No other acute osseous injuries in the thoracic or lumbar spine. 5. Multilevel degenerative changes of the thoracic and lumbar spine, as described above. 6. For findings in the posterior chest, abdomen and pelvis, see dedicated CT from which this study is  reconstructed. Electronically Signed   By: Lovena Le M.D.   On: 02/03/2019 20:56   DG Shoulder Left  Result Date: 02/03/2019 CLINICAL DATA:  Shoulder pain EXAM: LEFT SHOULDER - 2+ VIEW COMPARISON:  None FINDINGS: There is no evidence of fracture or dislocation. There is no evidence of arthropathy or other focal bone abnormality. Soft tissues are unremarkable. IMPRESSION: Negative evaluation of the left shoulder. Electronically Signed   By: Zetta Bills M.D.   On: 02/03/2019 19:43   DG Hand Complete Left  Result Date: 02/03/2019 CLINICAL DATA:  Hand pain EXAM: LEFT HAND - COMPLETE 3+ VIEW COMPARISON:  None FINDINGS: Degenerative changes at the first carpometacarpal joint. No signs of fracture or dislocation. Soft tissues are unremarkable. IMPRESSION: Degenerative changes without fracture. Electronically Signed   By: Zetta Bills M.D.   On: 02/03/2019 19:42   NM BRAIN DATSCAN TUMOR LOC INFLAM SPECT 1 DAY  Result Date: 01/28/2019 CLINICAL DATA:  Abnormal gait. Shuffling filling gait. 64 year old female. EXAM: NUCLEAR MEDICINE BRAIN IMAGING WITH SPECT  (DaTscan ) TECHNIQUE: SPECT images of the brain were obtained after intravenous injection of radiopharmaceutical. 4 hour post injection imaging. Appropriate positioning.  0.8 ml lugols solution administered in a.m RADIOPHARMACEUTICALS:  4.6 millicuries I [id number] Ioflupane COMPARISON:  Brain MRI 04/21/2016 FINDINGS: Symmetric intense uptake within LEFT and RIGHT striata. The heads of the caudate nuclei and the posterior striata (putamen) are normal shape. No evidence of loss of dopamine transport populations in the basal ganglia. IMPRESSION: Normal Ioflupane scan. No reduced radiotracer activity in basal ganglia to suggest Parkinson's syndrome pathology. Electronically Signed   By: Suzy Bouchard M.D.   On: 01/28/2019 16:10   VAS [place] LOWER EXTREMITY VENOUS (DVT)  Result Date: 02/17/2019  Lower Venous Study Indications: Swelling, and Edema.  Comparison  Study: 09/2015 Performing Technologist: Antonieta Pert RDMS, RVT  Examination Guidelines: A complete evaluation includes B-mode imaging, spectral Doppler, color Doppler, and power Doppler as needed of all accessible portions of each vessel. Bilateral testing is considered an integral part of a complete examination. Limited examinations for reoccurring indications may be performed as noted.  +---------+---------------+---------+-----------+----------+--------------+ RIGHT    CompressibilityPhasicitySpontaneityPropertiesThrombus Aging +---------+---------------+---------+-----------+----------+--------------+ CFV      Full           Yes      Yes                                 +---------+---------------+---------+-----------+----------+--------------+ SFJ      Full                                                        +---------+---------------+---------+-----------+----------+--------------+ FV Prox  Full                                                        +---------+---------------+---------+-----------+----------+--------------+ FV Mid   Full                                                        +---------+---------------+---------+-----------+----------+--------------+ FV DistalFull                                                        +---------+---------------+---------+-----------+----------+--------------+ PFV      Full                                                        +---------+---------------+---------+-----------+----------+--------------+ POP      Full           Yes      Yes                                 +---------+---------------+---------+-----------+----------+--------------+ PTV      Full                                                        +---------+---------------+---------+-----------+----------+--------------+  PERO     Full                                                         +---------+---------------+---------+-----------+----------+--------------+ GSV      Full                                                        +---------+---------------+---------+-----------+----------+--------------+   +---------+---------------+---------+-----------+----------+--------------+ LEFT     CompressibilityPhasicitySpontaneityPropertiesThrombus Aging +---------+---------------+---------+-----------+----------+--------------+ CFV      Full           Yes      Yes                                 +---------+---------------+---------+-----------+----------+--------------+ SFJ      Full                                                        +---------+---------------+---------+-----------+----------+--------------+ FV Prox  Full                                                        +---------+---------------+---------+-----------+----------+--------------+ FV Mid   Full                                                        +---------+---------------+---------+-----------+----------+--------------+ FV DistalFull                                                        +---------+---------------+---------+-----------+----------+--------------+ PFV      Full                                                        +---------+---------------+---------+-----------+----------+--------------+ POP      Full           Yes      Yes                                 +---------+---------------+---------+-----------+----------+--------------+ PTV      Full                                                        +---------+---------------+---------+-----------+----------+--------------+  PERO     Full                                                        +---------+---------------+---------+-----------+----------+--------------+ GSV      Full                                                         +---------+---------------+---------+-----------+----------+--------------+     Summary: Right: There is no evidence of deep vein thrombosis in the lower extremity. No cystic structure found in the popliteal fossa. Left: There is no evidence of deep vein thrombosis in the lower extremity. No cystic structure found in the popliteal fossa.  *See table(s) above for measurements and observations. Electronically signed by Harold Barban MD on 02/17/2019 at 7:38:52 PM.    Final     Labs:  Basic Metabolic Panel: Recent Labs  Lab 02/23/19 0620 02/26/19 1448  NA 136 139  K 3.7 3.8  CL 100 99  CO2 29 25  GLUCOSE 93 130*  BUN 15 18  CREATININE 1.02* 1.11*  CALCIUM 10.1 10.6*    CBC: Recent Labs  Lab 02/26/19 1448  WBC 11.3*  NEUTROABS 10.4*  HGB 10.9*  HCT 33.7*  MCV 96.3  PLT 256    CBG: No results for input(s): GLUCAP in the last 168 hours.  Brief HPI:   Krista Hodges is a 64 y.o. right-handed female with history of renal cancer of left radical laparoscopic nephrectomy 05/21/2014 endometrial adenocarcinoma 2017, hypothyroidism after XRT for papillary thyroid cancer, chronic fatigue and immune dysfunction syndrome with remote tobacco use.  History taken from chart.  Lives with spouse 1 level home family with good support.  Presented 02/03/2019 with gait instability.  Abnormal cervical chest CTs revealed multiple nodules diffusely suggestive of metastatic disease.  CT of the head reviewed showing hemorrhagic mass with vasogenic edema.  She underwent suboccipital craniotomy resection of tumor 02/04/2019 per Dr. Kathyrn Sheriff and placed on [place] for seizure prophylaxis as well as Decadron protocol.  Subcutaneous heparin for DVT prophylaxis 02/12/2019.  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 Past admission  physiatrist, therapy team and rehab RN have worked together to provide customized collaborative inpatient rehab.  Pertaining to patient's diffuse metastatic disease with cerebellar tumor and undergone resection 02/04/2019 as well as compression fractures T12 and L3 with conservative care.  Decadron protocol as indicated.  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  Contact made to neurosurgery there was consideration of possible shunt however after further discussion with family and palliative care consulted all were in agreement for hospice and discharged to home.  Pain management ongoing use of hydrocodone.      Rehab course: During patient's stay in rehab weekly team conferences were held to monitor patient's progress, set goals and discuss barriers to discharge. At admission, patient required moderate assist ambulate 15 feet rolling walker minimal assist stand pivot  transfers moderate assist side-lying to sitting.  Minimal assist upper body bathing mod assist lower body bathing minimal assist toilet transfers  Physical exam.  Blood pressure 136/68 pulse 51 temperature 97 respirations 18 oxygen saturation 96% room air Constitutional frail appearing female HEENT Head.  Normocephalic and atraumatic Eyes.  Pupils round and reactive to light no discharge nystagmus Neck.  Supple nontender no JVD without thyromegaly Cardiac regular rate rhythm without any extra sounds or murmur heard Respiratory decreased breath sounds clear to auscultation Abdomen.  Soft nontender positive bowel sounds Neurological.  Lethargic but arousable provides her name and age.  Upper extremity grossly graded 4 out of 5 proximal to distal lower extremities 3- out of 5 proximal 4 out of 5 distally  He/She  has had improvement in activity tolerance, balance, postural control as well as ability to compensate for  deficits. He/She has had improvement in functional use RUE/LUE  and RLE/LLE as well as improvement in awareness.  Patient limited due to multiple medical issues decreased intensity of 15/7.  Supine to sit transfers max assist for trunk control.  Heavy right posterior lean sitting balance edge of bed moderate assist to prevent right loss of balance.  Moderate assist plus to assist scoot to edge of bed.       Disposition: Discharged to home    Diet: Regular  Special Instructions: As per hospice care  Medications at discharge.  To be addressed by hospice care   Allergies as of 02/27/2019      Reactions   Penicillins Hives   Whelts. Has tolerated cephazolin and cephalexin. Has patient had a PCN reaction causing immediate rash, facial/tongue/throat swelling, SOB or lightheadedness with hypotension: Yes (per pt report) Has patient had a PCN reaction causing severe rash involving mucus membranes or skin necrosis: Yes Has patient had a PCN reaction that required hospitalization: No Has patient had a PCN reaction occurring within the last 10 years: No If all of the above answers are "NO", then may proceed with Cephalosporin   Aspirin    Cannot take due to nephrectomy   Keppra [levetiracetam] Nausea Only   Anxiety    Nsaids    Cannot take due to nephrectomy   Requip [ropinirole Hcl]    Headaches, restlessness      Medication List    STOP taking these medications   donepezil 10 MG tablet Commonly known as: ARICEPT   hydrochlorothiazide 25 MG tablet Commonly known as: HYDRODIURIL   memantine 5 MG tablet Commonly known as: NAMENDA   tiZANidine 4 MG tablet Commonly known as: ZANAFLEX   traMADol-acetaminophen 37.5-325 MG tablet Commonly known as: ULTRACET   traZODone 50 MG tablet Commonly known as: DESYREL     TAKE these medications   acetaminophen 325 MG tablet Commonly known as: TYLENOL Take 2 tablets (650 mg total) by mouth every 4 (four) hours as needed for mild pain  (temp > 100.5). What changed:   medication strength  how much to take  when to take this  reasons to take this   buPROPion 100 MG tablet Commonly known as: WELLBUTRIN Take 2 tablets (200 mg total) by mouth daily. Start taking on: February 28, 2019 What changed: how much to take   busPIRone 15 MG tablet Commonly known as: BUSPAR Take 1 tablet (15 mg total) by mouth 2 (two) times daily.   dexamethasone 4 MG tablet Commonly known as: DECADRON Take 1 tablet (4 mg total) by mouth every 6 (six) hours. What changed:   how  much to take  when to take this   docusate sodium 100 MG capsule Commonly known as: COLACE Take 2 capsules (200 mg total) by mouth 2 (two) times daily.   HYDROcodone-acetaminophen 5-325 MG tablet Commonly known as: NORCO/VICODIN Take 1 tablet by mouth every 4 (four) hours as needed for moderate pain.   irbesartan 300 MG tablet Commonly known as: AVAPRO Take 1 tablet (300 mg total) by mouth daily.   levothyroxine 75 MCG tablet Commonly known as: SYNTHROID Take 1 tablet (75 mcg total) by mouth daily before breakfast.   pantoprazole 40 MG tablet Commonly known as: PROTONIX Take 1 tablet (40 mg total) by mouth daily at 10 pm.   pramipexole 0.5 MG tablet Commonly known as: MIRAPEX Take 1 tablet (0.5 mg total) by mouth at bedtime.   rosuvastatin 10 MG tablet Commonly known as: CRESTOR Take 1 tablet (10 mg total) by mouth daily.   sertraline 50 MG tablet Commonly known as: ZOLOFT Take 2 tablets (100 mg total) by mouth daily. What changed:   medication strength  how much to take  when to take this   Systane Balance 0.6 % Soln Generic drug: Propylene Glycol Place 1 drop into both eyes daily.      Follow-up Information    Kirsteins, Luanna Salk, MD Follow up.   Specialty: Physical Medicine and Rehabilitation Why: Office to call for appointment Contact information: [place] [address] [phone]         Consuella Lose, MD Follow up.   Specialty: Neurosurgery Why: Call for appointment Contact information: [address] [place] [address] [phone]        Josue Hector, MD Follow up.   Specialty: Cardiology Why: Call for appointment Contact information: [id number] [place] [address] [phone]        Curt Bears, MD Follow up.   Specialty: Oncology Why: call for appointment Contact information: [place] [address] [phone]        Ventura Sellers, MD Follow up.   Specialties: Psychiatry, Neurology, Oncology Why: call for appointment Contact information: [place] [address] [phone]           Signed: Lavon Paganini [place] 02/27/2019, 10:52 AM

## 2019-02-27 NOTE — Progress Notes (Signed)
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 Recent suboccipital craniotomy for tumor resection. Left-sided headache. EXAM: CT HEAD WITHOUT CONTRAST TECHNIQUE: Contiguous axial images were obtained from the base of the skull through the vertex without intravenous contrast. COMPARISON:  MRI head 1223 2020, 02/05/2019.  CT head 02/03/2019 FINDINGS: Brain: Recent right occipital craniotomy for resection of metastatic disease in the right cerebellum. Moderate edema in the right cerebellum with mass-effect on the fourth ventricle and obstructive hydrocephalus. Progressive ventricular dilatation since prior studies. Increased cerebellar edema on the left compared to prior studies. Small metastatic deposit was noted on the left, not large enough to explain the amount of edema. Mass lesion in the superior cerebellar vermis measures approximately 23 x 23 mm, with interval growth since the prior studies. This may be the cause of increased edema in the left cerebellum. This also is contributing to obstructive hydrocephalus. Interval development of small areas of hemorrhage in the left basal ganglia. Two areas of subcentimeter hemorrhage are present at the site of previously noted metastatic disease. Increased surrounding white matter edema compared with prior study. Mild midline shift to the right. Vascular: Negative for hyperdense vessel Skull: Suboccipital right craniotomy with cranioplasty and mesh. No skeletal lesion. Sinuses/Orbits: Negative Other: None IMPRESSION: Recent right occipital craniotomy for resection of tumor in the right cerebellum. Moderate edema remains in the right cerebellum. Enlarging mass in the superior cerebellar vermis with surrounding edema and mass-effect. Progressive obstructive hydrocephalus.  Mild midline shift to the right Small areas of hemorrhage in metastatic deposits in the left basal ganglia. Increase in associated edema left basal ganglia. These results were called by telephone at the time of interpretation on 02/25/2019 at 1:46 pm to provider Silvestre Mesi PA , who verbally acknowledged these results. Electronically Signed   By: Franchot Gallo M.D.   On: 02/25/2019 13:47   MR BRAIN W WO CONTRAST  Result Date: 02/26/2019 CLINICAL DATA:  Follow-up right cerebellar tumor resection. EXAM: MRI HEAD WITHOUT AND WITH CONTRAST TECHNIQUE: Multiplanar, multiecho pulse sequences of the brain and surrounding structures were obtained without and with intravenous contrast. CONTRAST:  46m GADAVIST GADOBUTROL 1 MMOL/ML IV SOLN COMPARISON:  Head CT yesterday.  MRI 02/05/2019 and 02/04/2019. FINDINGS: Brain: Continued rapid enlargement of a superior cerebellar metastasis in the midline, now measuring 3.4 x 3.1 x 2.3 cm in diameter. Mass-effect results in relative obstruction of the aqueduct, with obstructive hydrocephalus of the lateral and third ventricles. Post resection changes of the inferior cerebellum on the right. No sign of residual or recurrent tumor in that location. 7 mm enlarging mass on the right near the foramen of Luschka. Two enlarging metastatic foci in the lateral cerebellum on the left measuring 3-4 mm in size, axial image 11. Other scattered small metastases in the cerebellum about that same size. Overall, there are probably 810 small cerebellar metastases. Within the cerebral hemispheres, there is enlargement of a left parietal deep white matter metastasis now measuring 7 mm in diameter, axial image 36. Enlarged metastasis in the left posterior basal ganglia/radiating white matter tracts no measuring 7.5 mm axial image 30. Enlarging metastasis in the left basal ganglia/genu of the internal capsule now measuring 9 mm in diameter axial image 28. Enlargement task assists in the left occipital  lobe now measuring 5 mm axial  image 27. On the right, there are several 4 mm and smaller metastases which are more evident. Vascular: Major vessels at the base of the brain show flow. Skull and upper cervical spine: Negative Sinuses/Orbits: Clear/negative Other: None IMPRESSION: Marked enlargement of a superior cerebellar metastasis measuring 3.4 x 3.1 x 2.3 cm today compared with about 1.5 cm in diameter on 02/04/2019. Marked increase in mass effect, with relative obstruction of the distal aqueduct/upper fourth ventricle. Obstructive hydrocephalus of the lateral and third ventricles. Numerous other metastatic lesions throughout the cerebellum and cerebral hemispheres which are larger or newly seen. Larger but subcentimeter metastases in the left basal ganglia, left occipital lobe and in the region of the foramen of Luschka on the right. Numerous other punctate metastases 4 mm and less within the cerebellum and cerebral hemispheres. The overall rate of growth of lesions in this case is extraordinarily fast. Electronically Signed   By: Nelson Chimes M.D.   On: 02/26/2019 12:37   Recent Labs    02/26/19 1448  WBC 11.3*  HGB 10.9*  HCT 33.7*  PLT 256   Recent Labs    02/26/19 1448  NA 139  K 3.8  CL 99  CO2 25  GLUCOSE 130*  BUN 18  CREATININE 1.11*  CALCIUM 10.6*    Intake/Output Summary (Last 24 hours) at 02/27/2019 0756 Last data filed at 02/26/2019 1818 Gross per 24 hour  Intake 537 ml  Output --  Net 537 ml     Physical Exam: Vital Signs Blood pressure 132/82, pulse 71, temperature 97.9 F (36.6 C), temperature source Oral, resp. rate 18, height '5\' 5"'$  (1.651 m), weight 70.2 kg, SpO2 99 %.   General: No acute distress Mood and affect are appropriate Heart: Regular rate and rhythm no rubs murmurs or extra sounds Lungs: Clear to auscultation, breathing unlabored, no rales or wheezes Abdomen: Positive bowel sounds, soft nontender to palpation, nondistended Extremities: No  clubbing, cyanosis, or edema Skin: No evidence of breakdown, no evidence of rash Neurologic: Cranial nerves II through XII intact, motor strength is 4/5 in bilateral deltoid, bicep, tricep, grip, hip flexor, knee extensors, ankle dorsiflexor and plantar flexor Sensory exam normal sensation to light touch and proprioception in bilateral upper and lower extremities Cerebellar exam mild dysmetria left greater than right upper extremity with finger-to-nose- now with pass pointing  Musculoskeletal: Full range of motion in all 4 extremities. No joint swelling.   Assessment/Plan: 1. Functional deficits secondary to RIght cerebellar met, endometrial primary  which require 3+ hours per day of interdisciplinary therapy in a comprehensive inpatient rehab setting.  Physiatrist is providing close team supervision and 24 hour management of active medical problems listed below.  Physiatrist and rehab team continue to assess barriers to discharge/monitor patient progress toward functional and medical goals  Care Tool:  Bathing  Bathing activity did not occur: Refused Body parts bathed by patient: Right arm, Left arm, Abdomen, Chest, Front perineal area, Buttocks, Right upper leg, Left upper leg, Face, Right lower leg, Left lower leg   Body parts bathed by helper: Right lower leg, Left lower leg     Bathing assist Assist Level: Contact Guard/Touching assist     Upper Body Dressing/Undressing Upper body dressing   What is the patient wearing?: Pull over shirt, Orthosis    Upper body assist Assist Level: Moderate Assistance - Patient 50 - 74%    Lower Body Dressing/Undressing Lower body dressing      What is the patient wearing?: Incontinence brief, Pants  Lower body assist Assist for lower body dressing: Minimal Assistance - Patient > 75%     Toileting Toileting Toileting Activity did not occur Landscape architect and hygiene only): Refused  Toileting assist Assist for toileting:  Moderate Assistance - Patient 50 - 74%     Transfers Chair/bed transfer  Transfers assist     Chair/bed transfer assist level: Minimal Assistance - Patient > 75%     Locomotion Ambulation   Ambulation assist      Assist level: Minimal Assistance - Patient > 75% Assistive device: Walker-rolling Max distance: 125   Walk 10 feet activity   Assist  Walk 10 feet activity did not occur: Safety/medical concerns(decreased balance/strong posterior lean in standing)  Assist level: Minimal Assistance - Patient > 75% Assistive device: Walker-rolling   Walk 50 feet activity   Assist Walk 50 feet with 2 turns activity did not occur: Safety/medical concerns(decreased balance/strong posterior lean in standing)  Assist level: Minimal Assistance - Patient > 75% Assistive device: Walker-rolling    Walk 150 feet activity   Assist Walk 150 feet activity did not occur: Safety/medical concerns(decreased balance/strong posterior lean in standing)         Walk 10 feet on uneven surface  activity   Assist Walk 10 feet on uneven surfaces activity did not occur: Safety/medical concerns(decreased balance/strong posterior lean in standing)         Wheelchair     Assist   Type of Wheelchair: Manual    Wheelchair assist level: Supervision/Verbal cueing Max wheelchair distance: 75    Wheelchair 50 feet with 2 turns activity    Assist    Wheelchair 50 feet with 2 turns activity did not occur: Safety/medical concerns   Assist Level: Supervision/Verbal cueing   Wheelchair 150 feet activity     Assist  Wheelchair 150 feet activity did not occur: Safety/medical concerns(decreased activity tolerance)       Blood pressure 132/82, pulse 71, temperature 97.9 F (36.6 C), temperature source Oral, resp. rate 18, height '5\' 5"'$  (1.651 m), weight 70.2 kg, SpO2 99 %.  Medical Problem List and Plan: 1.Decreased functional mobilitysecondary to diffuse metastatic  disease with cerebellar tumor status post resection 02/04/2019 as well as compression deformities T12 and L3 with conservative care and TLSO back brace when out of bed. Patient with slow Decadron taperdecreased to 2 mg twice daily x48 hours and stop -Pt with decline in balance and coordination as well as nausea,Repeat CT with hydrocephalus, increase edema due to enlargement of metastasis in cerbellum  decadron '4mg'$  QID        CIR PT, OT may rsume 15/7  Palliative consult pending , would be hospice candidate  2. Antithrombotics: -DVT/anticoagulation:Subcutaneous heparin -antiplatelet therapy: N/A 3. Pain Management:Hydrocodone as needed -back brace             -consider pre-treating for therapies 4.Zoloft 100 mg daily,:Wellbutrin 200 mg daily, BuSpar'15mg'$  twice daily -antipsychotic agents: N/A 5. Neuropsych: This patientis notcapable of making decisions on herown behalf. 6. Skin/Wound Care:Routine skin checks  incison looks good post staple removal  7. Fluids/Electrolytes/Nutrition:Routine in and outs with follow-up chemistries Start IVF at noc Meal intake variable 25-70%. Add megace 8. Hypertension. Avapro 300 mg daily. Monitor with increased mobility Vitals:   02/26/19 2008 02/27/19 0557  BP: (!) 157/82 132/82  Pulse: 60 71  Resp: 16 18  Temp: 97.6 F (36.4 C) 97.9 F (36.6 C)  SpO2: 97% 99%  Blood pressure well controlled 1/15 9. Hypothyroidism. Synthroid 10. Hyperlipidemia. Crestor  11. Restless leg syndrome. Mirapex 0.5 mg nightly 12. History of renal cancer with left radical laparoscopic nephrectomy 05/21/2014. As well as history of endometrial adenocarcinoma 2017. Follow-up outpatient- Dr Earlie Server, Dr Mickeal Skinner- question if aggressive treatment will be pursued, the short timeframe of tumor growth is discouraging  13. Constipation. Laxative assistance 14.  HypoK mild KCL 24mq x 1 , recheck BMET    1/11: K+ stable.   LOS: 11 days A FACE TO FACE EVALUATION WAS PERFORMED  ACharlett Blake1/15/2021, 7:56 AM

## 2019-02-27 NOTE — Progress Notes (Signed)
Patient discharged to home with PTAR. Discharge packet sent with patient. Family made aware, that patient is on their way. Patient resting without complaint. Krista Hodges A

## 2019-02-27 NOTE — Care Management (Signed)
Patient ID: Krista Hodges, female   DOB: [birth year], 64 y.o.   MRN: [id number] Received a call from Marcine Matar; patient's daughter [id number] her father received yesterday on the patient's status and prognosis. She has questions re: treatment options and recommendations. The daughter noted the husband and family would like the patient home asap given the current situation. Discussed concerns with [facility] for CIR and referred to Dr. Cleotilde Neer PA.

## 2019-02-27 NOTE — Progress Notes (Signed)
Hydrologist [facility])  Hospital Liaison: RN note    Notified by Transition of Care Manger, Dorien Chihuahua CSW of patient/family request for [facility] services at home after discharge. Chart and patient information reviewed by [facility] physician. Hospice eligibility confirmed.     Writer spoke with daughter Precious Bard to initiate education related to hospice philosophy, services and team approach to care. Patti verbalized understanding of information given. Per discussion, plan is for discharge to home by PTAR.     Please send signed and completed DNR form home with patient/family. Patient will need prescriptions for discharge comfort medications.     DME needs have been discussed, patient currently has the following equipment in the home: W/C and cane.  Patient/family requests the following DME for delivery to the home: hospital bed, 3N1 and walker. [place] equipment manager has been notified and will contact [employer] to arrange delivery to the home. Home address has been verified and is correct in the chart.   Precious Bard is the family member to contact to arrange time of delivery.     [facility] aware of the above. Please notify [facility] when patient is ready to leave the unit at discharge. (Call [phone] or [phone] after 5pm.) [facility] information and contact numbers given to  [facility].      Please call with any hospice related questions.     Thank you for this referral.     Farrel Gordon, RN, CCM  [place] (listed on [place] under Hospice and [place])  [phone]  ?

## 2019-02-27 NOTE — Discharge Summary (Signed)
Occupational Therapy Discharge Summary  Patient Details  Name: Krista Hodges MRN: [id number] Date of Birth: 20-Jun-1955  Patient has met 1 of 10 long term goals due to rapid medical decline impacting functional status. Pt is discharging home on comfort care at this time. DME and d/c needs solidified with spouse Tom.   Goals not met due to rapid medical decline impacting functional status   Recommendation:  Patient will benefit from comfort/hospice care at time of discharge  Equipment: 3:1, hospital bed  Reasons for discharge: change in medical status and discharge from hospital  Patient/family agrees with progress made and goals achieved: Yes  OT Discharge Precautions/Restrictions  Precautions Precautions: Back Required Braces or Orthoses: Spinal Brace Spinal Brace: Thoracolumbosacral orthotic;Applied in sitting position Restrictions Weight Bearing Restrictions: No General OT Amount of Missed Time: 41 Minutes ADL ADL Eating: Independent Grooming: Contact guard Upper Body Bathing: Supervision/safety Where Assessed-Upper Body Bathing: Shower Lower Body Bathing: Contact guard Where Assessed-Lower Body Bathing: Shower Upper Body Dressing: Moderate assistance Where Assessed-Upper Body Dressing: Sitting at sink Lower Body Dressing: Maximal assistance Toileting: Moderate assistance Where Assessed-Toileting: Glass blower/designer: Psychiatric nurse Method: Psychologist, educational: Environmental education officer Method: Radiographer, therapeutic: Radio broadcast assistant, Grab bars ADL Comments: All functional levels recorded reflect most recent staff documentation Vision  Rt gaze preference  Perception  Perception: Impaired Inattention/Neglect: Does not attend to left visual field Praxis Praxis: Impaired Praxis Impairment Details: Initiation;Motor planning Cognition Orientation Level: Oriented to person;Disoriented to  time;Disoriented to situation;Disoriented to place Memory: Impaired Sensation Coordination Gross Motor Movements are Fluid and Coordinated: No Fine Motor Movements are Fluid and Coordinated: No Motor  Motor Motor: Abnormal postural alignment and control;Motor impersistence Motor - Skilled Clinical Observations: Strong posterior lean in sitting and standing positions Mobility    Min A stand pivot toilet + shower transfers, per most recent staff documentation Trunk/Postural Assessment  Postural Control Postural Control: Deficits on evaluation(impaired, posterior bias and Lt lean in sitting + standing while engaged in functional activity)  Balance Balance Balance Assessed: Yes Dynamic Sitting Balance Dynamic Sitting - Balance Support: Feet supported Dynamic Sitting - Level of Assistance: 3: Mod assist(eating breakfast EOB, per most recent staff report) Dynamic Standing Balance Dynamic Standing - Balance Support: During functional activity;No upper extremity supported Dynamic Standing - Level of Assistance: 2: Max assist Dynamic Standing - Balance Activities: Lateral lean/weight shifting;Forward lean/weight shifting Dynamic Standing - Comments: Completing oral care and grooming tasks while standing at the sink, per most recent staff report        Skeet Simmer 02/27/2019, 12:10 PM

## 2019-02-27 NOTE — Progress Notes (Signed)
Portable equipment notified to get pump/channel. None available at this time.

## 2019-02-27 NOTE — Progress Notes (Signed)
Occupational Therapy Session Note  Patient Details  Name: KEMARIA CRUNKLETON MRN: [id number] Date of Birth: 09-23-1955  Today's Date: 02/27/2019 OT Individual Time: [id number] OT Individual Time Calculation (min): 19 min   Short Term Goals: Week 2:  OT Short Term Goal 1 (Week 2): STGs=LTGs secondary to upcoming discharge  Skilled Therapeutic Interventions/Progress Updates:    Pt greeted in bed, declining participation in tx due to fatigue. Spouse Tom present. Per RN, pt is now on comfort care and leaving facility this afternoon. Solidified DME and d/c needs with RN coordinating her care, Gershon Mussel also made aware. Provided pt with comfort items for room including aromatherapy for pillowcase to increase relaxation during rest. Discussed with Tom how to reposition pt in bed for pressure relief. Offered to show Gershon Mussel how to don TLSO with Gershon Mussel stating he will delegate this aspect of pts care to comfort care staff. +2 for boosting pt up in bed. Left pt with all needs within reach and bed alarm set. Time missed.   Therapy Documentation Precautions:  Precautions Precautions: Back Required Braces or Orthoses: Spinal Brace Spinal Brace: Thoracolumbosacral orthotic Restrictions Weight Bearing Restrictions: No Vital Signs:   Pain:   ADL:       Therapy/Group: Individual Therapy  Mayu Ronk A Deundra Furber 02/27/2019, 11:48 AM

## 2019-02-27 NOTE — Progress Notes (Signed)
Physical Therapy Discharge Summary  Patient Details  Name: Krista Hodges MRN: [id number] Date of Birth: 04-23-1955  Today's Date: 02/27/2019    Patient has met 0 of 12 long term goals due to sudden decline in function secondary to increased hydrocephalus as well as increased number and size of cerebral and cerebellar mets. D/c plan adjusted for comfort/hospice care upon return home. Patient to discharge at a wheelchair level Max Assist.  Patient's care partner requires assistance to provide the necessary physical assistance at discharge.  Reasons goals not met: see above  Recommendation:  Patient will benefit from comfort care with unfavorable prognosis of disease progress per neurology, to be comfortable and safe in home setting.   Equipment: No equipment provided  Reasons for discharge: lack of progress toward goals and decline in medical stability with transition to comfort care.   Patient/family agrees with progress made and goals achieved: Yes  PT Discharge Precautions/Restrictions Precautions Precautions: Back Required Braces or Orthoses: Spinal Brace Spinal Brace: Thoracolumbosacral orthotic;Applied in sitting position Vision/Perception  Vision - Assessment Alignment/Gaze Preference: Gaze right Perception Perception: Impaired Inattention/Neglect: Does not attend to left visual field Praxis Praxis: Impaired Praxis Impairment Details: Initiation;Motor planning  Cognition Orientation Level: Oriented to person;Disoriented to time;Disoriented to situation;Oriented to place Memory: Impaired Safety/Judgment: Impaired Comments: poor awareness of deficits and deceased initiation Sensation Sensation Light Touch: Appears Intact Proprioception: Impaired Detail Proprioception Impaired Details: Impaired RLE;Impaired RUE;Impaired LUE;Impaired LLE Coordination Gross Motor Movements are Fluid and Coordinated: No Fine Motor Movements are Fluid and Coordinated: No Coordination  and Movement Description: ataxia in BUE and BLE Motor  Motor Motor: Abnormal postural alignment and control;Motor impersistence;Ataxia Motor - Skilled Clinical Observations: Strong posterior lean in sitting and standing positions Motor - Discharge Observations: Strong posterior lean in sitting and standing positions ataxia in BUE  Mobility Bed Mobility Rolling Right: Moderate Assistance - Patient 50-74% Rolling Left: Moderate Assistance - Patient 50-74% Supine to Sit: Moderate Assistance - Patient 50-74% Sit to Supine: Moderate Assistance - Patient 50-74% Transfers Transfers: Stand to Sit;Sit to Stand Sit to Stand: Maximal Assistance - Patient 25-49% Stand to Sit: Maximal Assistance - Patient 25-49% Stand Pivot Transfers: (unable to motor plan due to midline disorientation) Locomotion  Gait Ambulation: No(poor midline orientation, increased lethargy, and decreased safety awareness following increased mets limits safety of gait) Gait Gait: No Stairs / Additional Locomotion Stairs: No Wheelchair Mobility Wheelchair Mobility: No(unable to transfer to [facility] safely with 1 person asssist.)  Trunk/Postural Assessment  Cervical Assessment Cervical Assessment: Exceptions to [facility] Thoracic Assessment Thoracic Assessment: Exceptions to WFL(TLSO) Lumbar Assessment Lumbar Assessment: Exceptions to [facility] Postural Control Postural Control: Deficits on evaluation(impaired, posterior bias and Lt lean in sitting + standing while engaged in functional activity)  Balance Balance Balance Assessed: Yes Dynamic Sitting Balance Dynamic Sitting - Balance Support: Feet supported Dynamic Sitting - Level of Assistance: 3: Mod assist(eating breakfast EOB, per most recent staff report) Static Standing Balance Static Standing - Balance Support: Bilateral upper extremity supported;During functional activity Static Standing - Level of Assistance: 3: Mod assist Dynamic Standing Balance Dynamic Standing - Balance  Support: During functional activity;No upper extremity supported Dynamic Standing - Level of Assistance: 2: Max assist Dynamic Standing - Balance Activities: Lateral lean/weight shifting;Forward lean/weight shifting Dynamic Standing - Comments: Completing oral care and grooming tasks while standing at the sink, per most recent staff report Extremity Assessment      RLE Assessment RLE Assessment: Exceptions to [facility] Active Range of Motion (AROM) Comments: ataxia notes General  Strength Comments: Grossly in sitting: 4-/5 throughout LLE Assessment LLE Assessment: Within Functional Limits Active Range of Motion (AROM) Comments: ataxia noted General Strength Comments: Grossly in sitting 4/5 throughout    [name] 02/27/2019, 1:59 PM

## 2019-02-27 NOTE — Plan of Care (Signed)
12/12 goals not met due to rapid medical decline impacting functional status        PT, DPT      

## 2019-02-27 NOTE — Progress Notes (Signed)
Physical Therapy Session Note  Patient Details  Name: Krista Hodges MRN: [id number] Date of Birth: [birth year]  Today's Date: 02/27/2019 PT Individual Time: 0800-0900 PT Individual Time Calculation (min): 60 min   Short Term Goals: Week 2:  PT Short Term Goal 1 (Week 2): Pt will perform bed mobility with min assist PT Short Term Goal 2 (Week 2): Pt will transfer to [facility] with min assist PT Short Term Goal 3 (Week 2): Pt will ambulate 28f with mod assist and LRAD  Skilled Therapeutic Interventions/Progress Updates:   Pt received supine in bed and agreeable to PT. Pt noted to have not started breakfast yet. PT assisted pt in eating approx 25% of food with min assist due to ataxia in the LUE, with past shooting with all movements with utensil. Min-mod cus for attention to task requiring PT to close door to limit distractions. Pt noted to have wet sounding voice following consumption of orange juice requiring cues to clear through and swallow to correct. PT present to complete evaluation while pt wokring on breakfast. Supine>sit transfer with max assist for LE and trunk management to prevent R/posterior LOB. Min-mod assist for sitting balance EOB while PT donned TLSO. Orthostatic vitals assessed with ted hose in place. Supine 144/73. Sitting 150/84. Standing 0 min 134/90. Standing 1 min 147/82. Pt reports mild dizziness in standing 2/2 poor midline orientation. Sitting balance EOB to perform forward reach to target outside BOS x 8 BUE with mod progressing to min assist. Sit<>stand x 3 with mod assist and max cues for anterior weight shift. pregait stepping task x 3 BLE with increased assist for weight shift to allow RLE to move. Pt noted to have been in continent. PT doffed and donned brief while pt in standing with BUE support mod assist to prevent posterior LOB. Mod assist once sitting back on EOB to prevent R LOB while PT removed TLSO. Sit>supine with mod assist for BLE management and cues for improve  awareness of trunk position once in bed. Pt left supine in bed with call bell in reach and all needs met.     Session 2.  Pt's d/c plan has changed to comfort care in the home due to rapid progression of disease. Per case manager, pt will be discharged this afternoon.    Therapy Documentation Precautions:  Precautions Precautions: Back Required Braces or Orthoses: Spinal Brace Spinal Brace: Thoracolumbosacral orthotic Restrictions Weight Bearing Restrictions: No General: PT Amount of Missed Time (min): 60 Minutes PT Missed Treatment Reason: MD hold (Comment);Patient ill (Comment) Vital Signs: Therapy Vitals Temp: 97.9 F (36.6 C) Temp Source: Oral Pulse Rate: 71 Resp: 18 BP: 132/82 Patient Position (if appropriate): Lying Oxygen Therapy SpO2: 99 % O2 Device: Room Air Pain: Pain Assessment Pain Scale: 0-10 Pain Score: 4  Pain Type: Acute pain Pain Location: Head Pain Descriptors / Indicators: Aching;Discomfort Pain Frequency: Constant Pain Onset: On-going Patients Stated Pain Goal: 1 Pain Intervention(s): Medication (See eMAR)       Therapy/Group: Individual Therapy  ALorie Phenix1/15/2021, 9:03 AM

## 2019-02-27 NOTE — Plan of Care (Signed)
  Problem: Consults Goal: RH BRAIN INJURY PATIENT EDUCATION Description: Description: See Patient Education module for eduction specifics Outcome: Progressing   Problem: RH SAFETY Goal: RH STG ADHERE TO SAFETY PRECAUTIONS W/ASSISTANCE/DEVICE Description: STG Adhere to Safety Precautions With cues and reminder.  Outcome: Progressing

## 2019-02-27 NOTE — Care Management (Signed)
The overall goal for the admission was met for: No; discharging home for comfort care due to rapid progression of medical issues. CODE status changed to DNR.  Discharge location: Home with family and [facility]  Length of Stay: 11 days with discharge on 02/27/19  Discharge activity level: Supine>sit transfer with max assist for trunk control. Heavy R/posterior lean. Mod assist to return to supine. +2 assist to scoot to HOB.  Max A for B+D  Home/community participation: Plan services from [facility] to assist the family  Services provided included: MD, RD, PT, OT, SLP, RN, CM, Pharmacy, Neuropsych and SW  Financial Services: Medicare : U[employer] Medicare  Follow-up services arranged: Patient/Family has no preference for HH/DME agencies  Comments (or additional information):Referral made to [facility]; Hospice at Home  Patient/Family verbalized understanding of follow-up arrangements: Yes  Individual responsible for coordination of the follow-up plan: Thomas Berrones (spouse) (C) [phone] Son:David Blue (C) [phone] And  Daughter: Patti Capps (C) [phone]  Confirmed correct DME delivered: [facility] to confirm and deliver DME/hospital bed to home.  Confirmed with [facility] that equipment ordered/family noted equipment was delivered to the home and they are ready for the patient.  [facility] transport to home arranged.. ,  B      

## 2019-02-27 NOTE — Care Management (Signed)
Patient ID: Krista Hodges, female   DOB: 02-Mar-1955, 64 y.o.   MRN: [id number] Palliative Care consulted on patient. Spoke with patient, spouse and daughter. Plan referral to Hospice at Home. CODE status changed to DNR. Referral made to [facility] with Farrel Gordon for discharge as soon as arrangements can be made for home care. Reviewed DME and need for a hospital bed. Information reviewed with patient and spouse.

## 2019-02-27 NOTE — Consult Note (Signed)
Consultation Note Date: 02/27/2019   Patient Name: Krista Hodges  DOB: [birth year]  MRN: [id number]  Age / Sex: 64 y.o., female  PCP: Krista Huddle, MD Referring Physician: Charlett Blake, MD  Reason for Consultation: Establishing goals of care and Psychosocial/spiritual support  HPI/Patient Profile: 64 y.o. female  with past medical history of renal cancer, thyroid cancer, endometrial cancer who was admitted to [place] on 02/16/2019 after craniectomy for right cerebellar metastasis. Unfortunately she was noted to have functional decline.  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   Clinical Assessment and Goals of Care:  I have reviewed medical records including EPIC notes, labs and imaging, received report from the CIR team, examined the patient, spoke on the phone with her daughter and met at bedside with her husband  to discuss diagnosis prognosis, [place], EOL wishes, disposition and options.  I introduced Palliative Medicine as specialized medical care for people living with serious illness. It focuses on providing relief from the symptoms and stress of a serious illness.   We discussed a brief life review of the patient. She has been married for 42 years to Krista Hodges.  They have 3 children who all live in [place].  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  As far as functional and nutritional status she is lethargic this morning and does not appear capable of performing ADLs without significant assistance.  We discussed her current illness and what it means in the larger context of her on-going co-morbidities.  Natural disease trajectory and expectations at EOL were discussed.  I attempted to elicit values and goals of care important to the patient.   The difference between aggressive medical intervention and comfort care was  considered in light of the patient's goals of care.  Advanced directives, concepts specific to code status, artifical feeding and hydration, and rehospitalization were considered and discussed.  Krista Hodges and [place]a Hodges (husband and dtr) speak for the patient.  Their primary goal is to get her home as fast as possible and to spend time with her.  The patient wants to be at home with her 4 dogs.  Husband Krista Hodges agrees with changing code status to DNR as a protective measure so that she will not be coded at the end of her life.  The family would like to have Hospice in the home.  We discussed the services that Hospice will provide.  Family may need EOL hospice house if Krista Hodges can not be kept comfortable.  Hospice and Palliative Care services outpatient were explained and offered.  Questions and concerns were addressed.  The family was encouraged to call with questions or concerns.   Primary Decision Maker:  NEXT OF KIN husband and family.    [place] home with hospice as soon as possible (today is preferable)  Needs Hospital bed in the home prior to discharge.  DNR gold form placed on chart  Recommend patient be discharged with comfort  meds including:  Dexamethasone, morphine SL, ativan (for possible seizure as well as anxiety).  These meds were placed as orders on the inpatient chart and discussed with discharging CIR PA, Krista Hodges.  Unrestricted visitation status - per discretion of RN  Code Status/Advance Care Planning:  DNR   Symptom Management:   As above.  Additional Recommendations (Limitations, Scope, Preferences):  Full Comfort Care  Palliative Prophylaxis:   Frequent Pain Assessment and Oral Care  Psycho-social/Spiritual:   Desire for further Chaplaincy support:  Welcomed.  Catholic.  Prognosis:  Days to perhaps 2 weeks given fast growing brain mets with obstructive hydrocephalus.   Discharge Planning: Home with Hospice      Primary  Diagnoses: Present on Admission:  Cerebellar tumor ([place])   I have reviewed the medical record, interviewed the patient and family, and examined the patient. The following aspects are pertinent.  Past Medical History:  Diagnosis Date   Allergic rhinitis    Anxiety state    Arthritis, degenerative    Atypical migraine    Awareness of heartbeats    Cancer of left kidney (HCC)    Left Kidney   Cervical radiculitis    CFIDS (chronic fatigue and immune dysfunction syndrome) (HCC)    Depression, major, single episode, in partial remission (HCC)    Depression, neurotic    Elevated serum creatinine 03/2016   history of (2.07)   Endometrial adenocarcinoma (HCC)    Stage II grade2   Essential (primary) hypertension    Gastric ulcer    Heart murmur    pt. denies   HLD (hyperlipidemia)    Inflammation of hand joint    Memory difficulties    Premature complex, ventricular    Renal disorder    Renal mass, left    Restless leg    Shortness of breath dyspnea    occasional   Thrombophlebitis of superficial veins of lower extremity    Tremor    L arm   Vitamin D deficiency    Social History   Socioeconomic History   Marital status: Married    Spouse name: Krista Hodges   Number of children: 3   Years of education: 13   Highest education level: Not on file  Occupational History   Occupation: nurse  Tobacco Use   Smoking status: Former Smoker    Packs/day: 1.00    Types: Cigarettes    Quit date: 05/03/2007    Years since quitting: 11.8   Smokeless tobacco: Never Used  Substance and Sexual Activity   Alcohol use: Yes    Comment: occ   Drug use: No   Sexual activity: Yes  Other Topics Concern   Not on file  Social History Narrative   Lives with husband   Caffeine use: 1 cup coffee per day   Left handed    Social Determinants of Health   Financial Resource Strain:    Difficulty of Paying Living Expenses: Not on file  Food Insecurity:     Worried About Charity fundraiser in the Last Year: Not on file   [employer] of Food in the Last Year: Not on file  Transportation Needs:    Lack of Transportation (Medical): Not on file   Lack of Transportation (Non-Medical): Not on file  Physical Activity:    Days of Exercise per Week: Not on file   Minutes of Exercise per Session: Not on file  Stress:    Feeling of Stress : Not on file  Social Connections:  Frequency of Communication with Friends and Family: Not on file   Frequency of Social Gatherings with Friends and Family: Not on file   Attends Religious Services: Not on file   Active Member of Clubs or Organizations: Not on file   Attends Archivist Meetings: Not on file   Marital Status: Not on file   Family History  Problem Relation Age of Onset   Cancer Mother    Heart attack Father    Diabetes Other    Dementia Neg Hx    Scheduled Meds:  buPROPion  200 mg Oral Daily   busPIRone  15 mg Oral BID   dexamethasone  4 mg Oral Q6H   docusate sodium  200 mg Oral BID   irbesartan  300 mg Oral Daily   levothyroxine  75 mcg Oral Q0600   pantoprazole  40 mg Oral Q2200   pramipexole  0.5 mg Oral QHS   senna  1 tablet Oral BID   sertraline  100 mg Oral Daily   Continuous Infusions:  sodium chloride 75 mL/hr at 02/27/19 0610   PRN Meds:.acetaminophen **OR** acetaminophen, bisacodyl, HYDROcodone-acetaminophen, ondansetron **OR** ondansetron (ZOFRAN) IV, polyethylene glycol, traZODone Allergies  Allergen Reactions   Penicillins Hives    Whelts. Has tolerated cephazolin and cephalexin. Has patient had a PCN reaction causing immediate rash, facial/tongue/throat swelling, SOB or lightheadedness with hypotension: Yes (per pt report) Has patient had a PCN reaction causing severe rash involving mucus membranes or skin necrosis: Yes Has patient had a PCN reaction that required hospitalization: No Has patient had a PCN reaction occurring within  the last 10 years: No If all of the above answers are "NO", then may proceed with Cephalosporin   Aspirin     Cannot take due to nephrectomy   Keppra [Levetiracetam] Nausea Only    Anxiety    Nsaids     Cannot take due to nephrectomy   Requip [Ropinirole Hcl]     Headaches, restlessness   Review of Systems denies pain, SOB, constipation.  However patient does not appear to be an accurate historian.  Physical Exam  Well developed very pleasant ill appearing female, awake, responsive, lethargic CV rrr resp some coarse breath sounds, NAD Abdomen Soft, nt, nd  Vital Signs: BP 132/82 (BP Location: Left Arm)    Pulse 71    Temp 97.9 F (36.6 C) (Oral)    Resp 18    Ht _0  (1.651 m)    Wt 70.2 kg    SpO2 99%    BMI 25.75 kg/m  Pain Scale: 0-10   Pain Score: 4    SpO2: SpO2: 99 % O2 Device:SpO2: 99 % O2 Flow Rate: .   IO: Intake/output summary:   Intake/Output Summary (Last 24 hours) at 02/27/2019 1059 Last data filed at 02/27/2019 0900 Gross per 24 hour  Intake 417 ml  Output --  Net 417 ml    LBM: Last BM Date: 02/25/19 Baseline Weight: Weight: 70.2 kg Most recent weight: Weight: 70.2 kg     Palliative Assessment/Data: 20%     Time In: 10:00 Time Out: 11:15 Time Total: 75 min. Visit consisted of counseling and education dealing with the complex and emotionally intense issues surrounding the need for palliative care and symptom management in the setting of serious and potentially life-threatening illness. Greater than 50%  of this time was spent counseling and coordinating care related to the above assessment and plan.  Signed by: Florentina Jenny, PA-C Palliative Medicine Pager: [phone]  Please contact Palliative Medicine Team phone at [phone] for questions and concerns.  For individual provider: See Shea Evans

## 2019-02-27 NOTE — Progress Notes (Signed)
Transport arrived for pt to be d/c. Vitals taken recently and were w/in normal parameters. Pt confused but currently calm and resting

## 2019-02-27 NOTE — Plan of Care (Signed)
9/10 goals not met due to rapid medical decline impacting functional status

## 2019-03-16 DEATH — deceased

## 2020-11-04 IMAGING — MR MR HEAD WO/W CM
7 of 14 series · 22 of 48 positions shown · IV contrast (Yes GAD)
Comparison: Head CT yesterday.  MRI 02/05/2019 and 02/04/2019.

CLINICAL DATA: Follow-up right cerebellar tumor resection.

EXAM:
MRI HEAD WITHOUT AND WITH CONTRAST
TECHNIQUE: Multiplanar, multiecho pulse sequences of the brain and surrounding
structures were obtained without and with intravenous contrast.
CONTRAST:  7mL GADAVIST GADOBUTROL 1 MMOL/ML IV SOLN

[Series 2: DWI · axial · 3.0mm · 0.94mm/px · z∈[-123,+19]mm · 6 of 98 slices shown (1 of 2)]
[im 1/98]
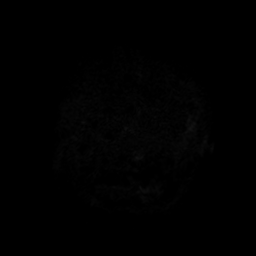
[im 20/98]
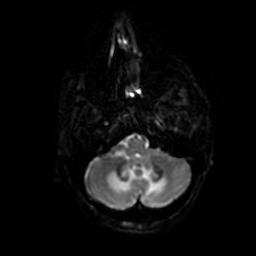
[im 39/98]
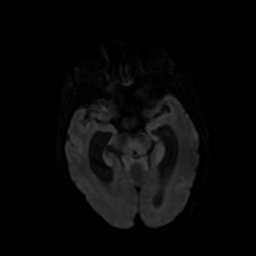
[im 59/98]
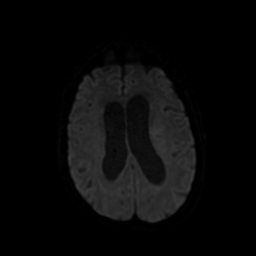
[im 78/98]
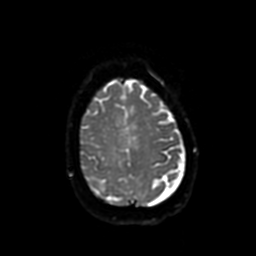
[im 98/98]
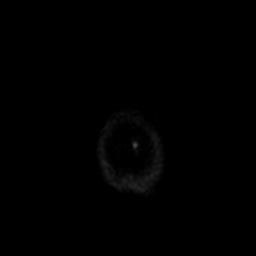

[Series 3: DWI · coronal · 4.0mm · 0.94mm/px · 5 of 70 slices shown (2 of 2)]
[im 1/70]
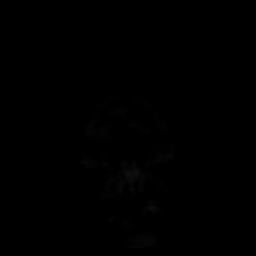
[im 18/70]
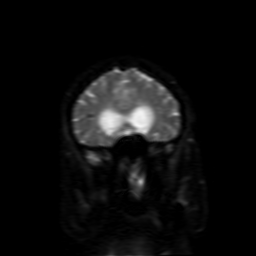
[im 35/70]
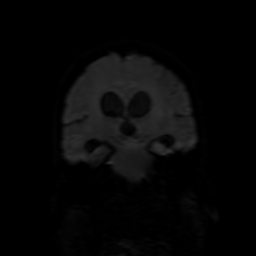
[im 52/70]
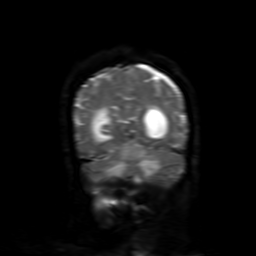
[im 70/70]
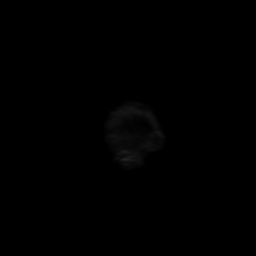

[Series 4: FLAIR · sagittal · 5.0mm · 0.23mm/px · 2 of 23 slices shown (1 of 3)]
[im 1/23]
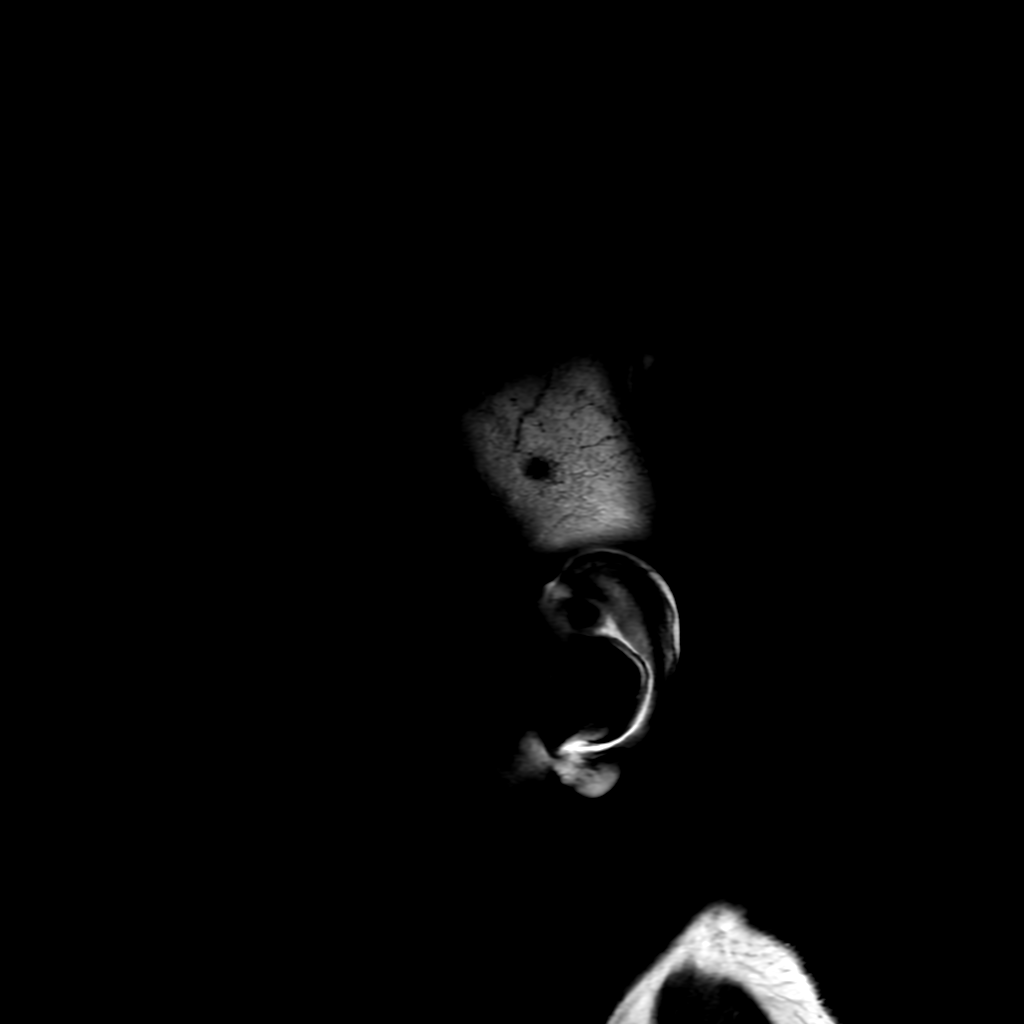
[im 23/23]
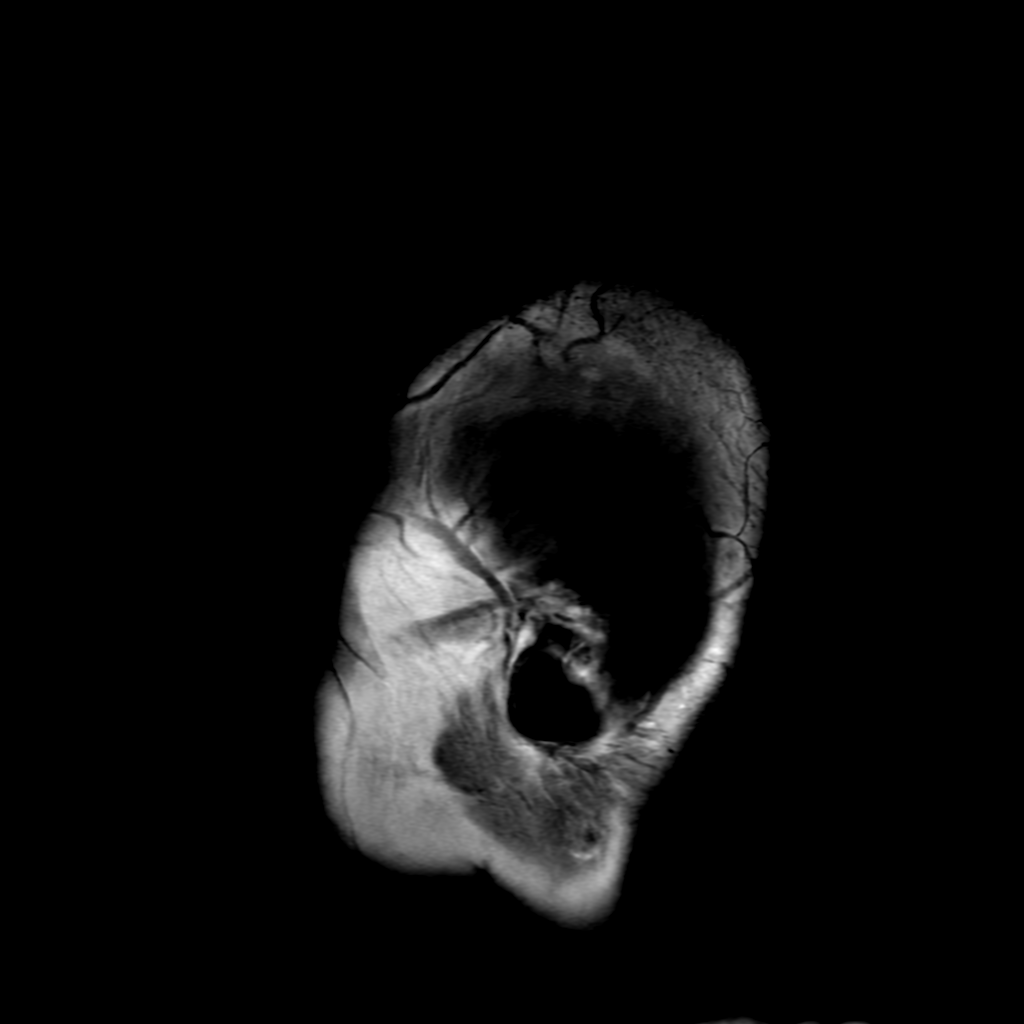

[Series 5: FLAIR · sagittal · 5.0mm · 0.47mm/px · 2 of 24 slices shown (2 of 3)]
[im 1/24]
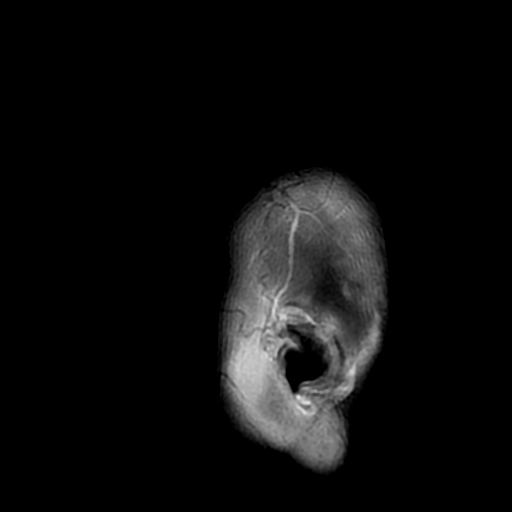
[im 24/24]
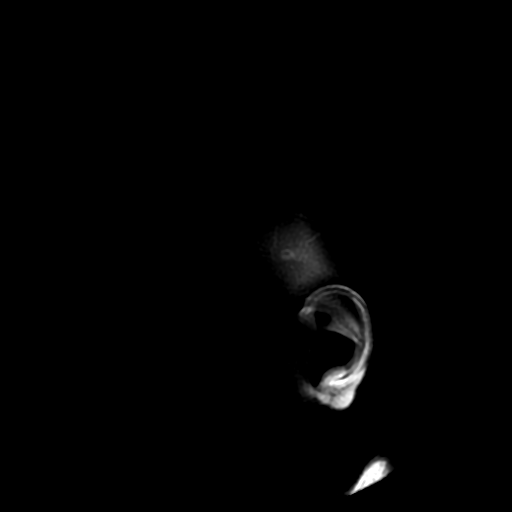

[Series 6: FLAIR · axial · 3.0mm · 0.47mm/px · z∈[-131,+30]mm · 2 of 28 slices shown (3 of 3)]
[im 1/28]
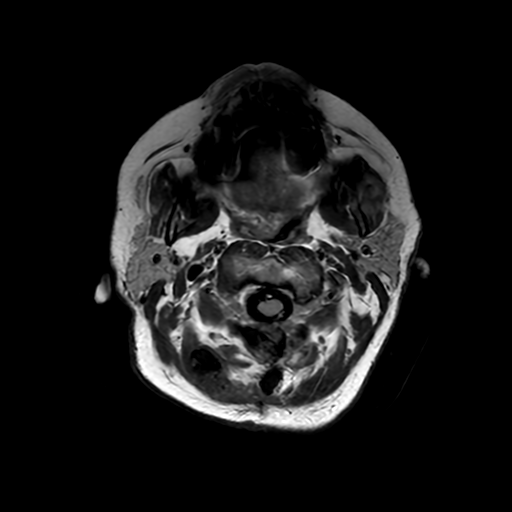
[im 28/28]
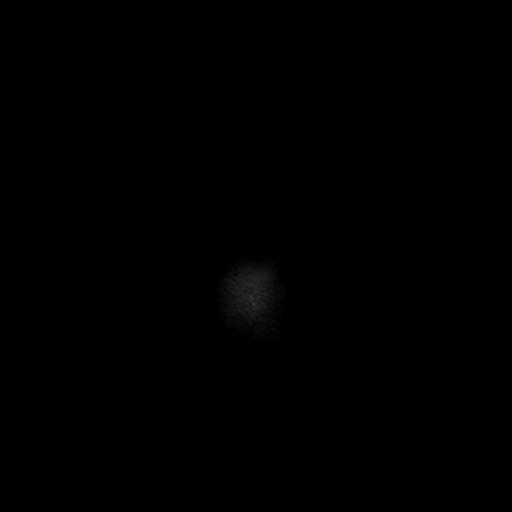

[Series 250: ADC · axial · 3.0mm · 0.94mm/px · z∈[-123,+19]mm · 3 of 49 slices shown (1 of 2)]
[im 1/49]
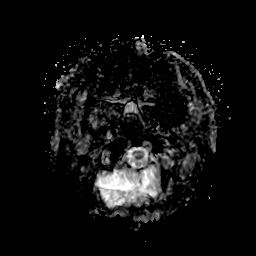
[im 25/49]
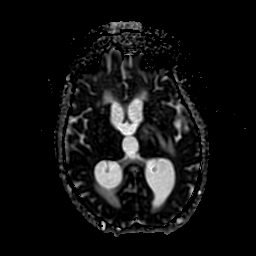
[im 49/49]
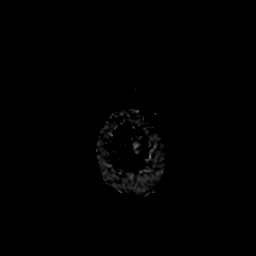

[Series 350: ADC · coronal · 4.0mm · 0.94mm/px · 2 of 35 slices shown (2 of 2)]
[im 1/35]
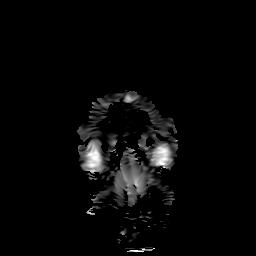
[im 35/35]
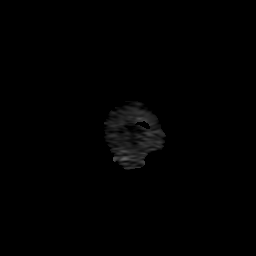

[22 of 48 positions shown; findings below may reference images not displayed]

FINDINGS: Brain: Continued rapid enlargement of a superior cerebellar
metastasis in the midline, now measuring 3.4 x 3.1 x 2.3 cm in
diameter. Mass-effect results in relative obstruction of the
aqueduct, with obstructive hydrocephalus of the lateral and third
ventricles.

Post resection changes of the inferior cerebellum on the right. No
sign of residual or recurrent tumor in that location. 7 mm enlarging
mass on the right near the foramen of Luschka. Two enlarging
metastatic foci in the lateral cerebellum on the left measuring 3-4
mm in size, axial image 11. Other scattered small metastases in the
cerebellum about that same size. Overall, there are probably 810
small cerebellar metastases.

Within the cerebral hemispheres, there is enlargement of a left
parietal deep white matter metastasis now measuring 7 mm in
diameter, axial image 36. Enlarged metastasis in the left posterior
basal ganglia/radiating white matter tracts no measuring 7.5 mm
axial image 30. Enlarging metastasis in the left basal ganglia/genu
of the internal capsule now measuring 9 mm in diameter axial image
28. Enlargement task assists in the left occipital lobe now
measuring 5 mm axial image 27. On the right, there are several 4 mm
and smaller metastases which are more evident.

Vascular: Major vessels at the base of the brain show flow.

Skull and upper cervical spine: Negative

Sinuses/Orbits: Clear/negative

Other: None
IMPRESSION: Marked enlargement of a superior cerebellar metastasis measuring
x 3.1 x 2.3 cm today compared with about 1.5 cm in diameter on
02/04/2019. Marked increase in mass effect, with relative
obstruction of the distal aqueduct/upper fourth ventricle.
Obstructive hydrocephalus of the lateral and third ventricles.

Numerous other metastatic lesions throughout the cerebellum and
cerebral hemispheres which are larger or newly seen. Larger but
subcentimeter metastases in the left basal ganglia, left occipital
lobe and in the region of the foramen of Luschka on the right.
Numerous other punctate metastases 4 mm and less within the
cerebellum and cerebral hemispheres.

The overall rate of growth of lesions in this case is
extraordinarily fast.
